# Patient Record
Sex: Male | Born: 1955 | Race: White | Hispanic: No | Marital: Married | Smoking: Current every day smoker
Health system: Southern US, Community
[De-identification: ages and names within clinical notes are randomized; demographics above are authoritative.]

## PROBLEM LIST (undated history)

## (undated) DIAGNOSIS — J449 Chronic obstructive pulmonary disease, unspecified: Secondary | ICD-10-CM

## (undated) DIAGNOSIS — F250 Schizoaffective disorder, bipolar type: Secondary | ICD-10-CM

## (undated) DIAGNOSIS — G4730 Sleep apnea, unspecified: Secondary | ICD-10-CM

## (undated) DIAGNOSIS — E079 Disorder of thyroid, unspecified: Secondary | ICD-10-CM

## (undated) DIAGNOSIS — G2401 Drug induced subacute dyskinesia: Secondary | ICD-10-CM

## (undated) HISTORY — PX: APPENDECTOMY: SHX54

---

## 2004-11-05 ENCOUNTER — Emergency Department: Payer: Self-pay | Admitting: Emergency Medicine

## 2004-11-12 ENCOUNTER — Inpatient Hospital Stay: Payer: Self-pay | Admitting: Internal Medicine

## 2005-03-14 ENCOUNTER — Emergency Department: Payer: Self-pay | Admitting: Internal Medicine

## 2005-03-14 ENCOUNTER — Other Ambulatory Visit: Payer: Self-pay

## 2005-05-11 ENCOUNTER — Ambulatory Visit: Payer: Self-pay | Admitting: Gastroenterology

## 2005-05-23 ENCOUNTER — Emergency Department: Payer: Self-pay | Admitting: Emergency Medicine

## 2005-06-03 ENCOUNTER — Ambulatory Visit: Payer: Self-pay | Admitting: Gastroenterology

## 2006-08-04 ENCOUNTER — Emergency Department: Payer: Self-pay | Admitting: Emergency Medicine

## 2006-08-17 ENCOUNTER — Other Ambulatory Visit: Payer: Self-pay

## 2006-08-17 ENCOUNTER — Emergency Department: Payer: Self-pay | Admitting: Emergency Medicine

## 2006-12-11 ENCOUNTER — Emergency Department: Payer: Self-pay | Admitting: Emergency Medicine

## 2007-03-15 ENCOUNTER — Ambulatory Visit: Payer: Self-pay | Admitting: Psychiatry

## 2007-03-15 ENCOUNTER — Inpatient Hospital Stay (HOSPITAL_COMMUNITY): Admission: AD | Admit: 2007-03-15 | Discharge: 2007-03-17 | Payer: Self-pay | Admitting: Psychiatry

## 2007-08-02 ENCOUNTER — Emergency Department: Payer: Self-pay | Admitting: Internal Medicine

## 2007-08-02 ENCOUNTER — Other Ambulatory Visit: Payer: Self-pay

## 2007-09-10 ENCOUNTER — Other Ambulatory Visit: Payer: Self-pay

## 2007-09-10 ENCOUNTER — Emergency Department: Payer: Self-pay | Admitting: Emergency Medicine

## 2009-04-05 ENCOUNTER — Inpatient Hospital Stay: Payer: Self-pay | Admitting: Internal Medicine

## 2009-11-29 ENCOUNTER — Emergency Department: Payer: Self-pay | Admitting: Emergency Medicine

## 2010-09-01 NOTE — Discharge Summary (Signed)
NAMEFORTINO, HAAG               ACCOUNT NO.:  [id number]   MEDICAL RECORD NO.:  [id number]          PATIENT TYPE:  IPS   LOCATION:  0405                          FACILITY:  [facility]   PHYSICIAN:  Anselm Jungling, MD  DATE OF BIRTH:  Jul 19, 1955   DATE OF ADMISSION:  03/15/2007  DATE OF DISCHARGE:  03/17/2007                               DISCHARGE SUMMARY   IDENTIFYING DATA AND REASON FOR ADMISSION:  This is the first [facility]  admission for Mr. Capri, a 55 year old married Caucasian male with a  long history of mental illness.  He is currently a client of the [address], located in [place], [place].  He is currently  under the care of Dr. Jeni Salles, and carries a diagnosis of  schizoaffective disorder, bipolar type, and OCD.   The patient was referred for admission due to severe psychotic  decompensation.  He was seen by the [facility] physician on 03/15/07,  the day of admission to our facility, and referred to [place].  He had not  slept for the previous 2 days.  He had forgotten to take some of his  medications during recent days, although to what extent he had been  noncompliant with his regimen was not clear.  He had been on a regimen  of Prozac 60 mg daily, Abilify 10 mg q.a.m., Clozaril 100 mg q.a.m. and  400 mg q.h.s., Valium 5 mg p.r.n., and Ambien 5 mg h.s.  Please refer to  the admission note for further details pertaining to the symptoms,  circumstances, and history that led to his hospitalization.  He was  given an Axis I diagnosis of schizoaffective disorder, bipolar type,  acute exacerbation, history of OCD.   MEDICAL AND LABORATORY:  The patient was medically and assessed by the  psychiatric nurse practitioner.  He is in good health, without any  active medical problems.  Referral documentation indicates a history of  irritable bowel syndrome.   ADMISSION LABORATORY:  Showed CBC, TSH, comprehensive metabolic panel,  urinalysis all within normal  limits. A urine drug screen was positive  for benzodiazepine, consistent with his prescription for diazepam.  A  Clozaril level was pending at the time of this dictation.   HOSPITAL COURSE:  The patient was admitted to the adult inpatient  psychiatric service where he presented as an obese, but well-nourished  and normally developed adult male who was disoriented to time, place,  and situation.  He displayed profound thought disorder, with  disorganized thinking and he was clearly responding strongly to internal  stimuli.  He appeared paranoid, alarmed, and hypervigilant.   Over the first 24 hours of his inpatient stay, he was intermittently  aggressive towards staff, requiring emergency responses by the staff,  and locked door seclusion on more than one occasion.   With respect to medication, Prozac was not continued because of the  appearance of possible manic activation.  However, he was continued on  his usual Abilify 10 mg q.a.m., and Clozaril 100 mg q.a.m. and 400 mg  q.h.s.  He was initially given several doses  of Ativan p.r.n., but he  continued to escalate, and there was concern that Ativan may have been  disinhibiting him.   Following this, he was given p.r.n. doses of Risperdal M-Tab, which did  not appear to be especially effective, and following this, p.r.n. doses  of Zyprexa, both orally and intramuscular.  In spite of repeated p.r.n.  doses, the patient continued intermittently agitated, and striking out  at staff.   After approximately 24 hours of treatment, he was given p.r.n.  Thorazine, 100 mg IM, and even after 3 or 4 doses of Thorazine, he  continued alert, aggressive, irritable, and hostile, and while in locked-  door seclusion, banging loudly on the door.   The patient was admitted on a voluntary basis, but he was involuntarily  committed during his stay with [place].   We learned, from discussion with his wife, that he had a history of  previous  hospitalizations at [facility].  It appeared to [place]  that the patient was going to require a course of treatment that was  going to involve greater duration and intensity than our facility can  provide.  As such, we have taken steps to explore the possibility to  transfer the patient to the [facility] for further  treatment.   At this time, it appears that there is a strong affective, manic  component to his psychosis, as indicated by his markedly diminished  sleep need, and continuing activation and hypervigilance in spite of  numerous doses of anti-psychotic medications as described above.  As  such it appears appropriate at this time to initiate mood stabilizing  medication, and, while the patient remains with [place], awaiting transfer,  we will begin Depakote 500 mg t.i.d.  Thorazine p.r.n.s will be  increased to 300 mg IM p.r.n.  At this point, the patient has struck out  and injured some staff, and appears quite dangerous, so it is felt that  the increase to 300 mg Thorazine doses is appropriate for the safety of  others, as well as stabilization of the patient.   DISCHARGE DIAGNOSES:  AXIS I:  Schizoaffective disorder, bipolar type,  manic, with psychotic features, history of OCD.  AXIS II:  Deferred.  AXIS III:  History of irritable bowel syndrome.  AXIS IV:  Stressors, severe.  AXIS V:  Global assessment of function 25.      Anselm Jungling, MD  Electronically Signed     SPB/MEDQ  D:  03/17/2007  T:  03/17/2007  Job:  [id number]

## 2010-09-01 NOTE — H&P (Signed)
NAMECHARAN, PRIETO NO.:  [id number]   MEDICAL RECORD NO.:  [id number]          PATIENT TYPE:  IPS   LOCATION:  0405                          FACILITY:  [facility]   PHYSICIAN:  Anselm Jungling, MD  DATE OF BIRTH:  1955/05/04   DATE OF ADMISSION:  03/15/2007  DATE OF DISCHARGE:                              HISTORY & PHYSICAL   IDENTIFYING INFORMATION:  This is a 55 year old married white male.  This is a voluntary admission.   HISTORY OF PRESENT ILLNESS AND CHIEF COMPLAINT:  The patient was  referred by his psychiatrist in [facility], Dr. Darnelle Going, for  psychosis.  He had called the crisis line there 2 days in a row and then  presented disheveled complaining of no sleep for the previous 3-4  nights, anxiety, poor appetite.  Was having a lot of hyper-religious  thinking, feeling that God was punishing him, that he was somehow the  cause of the death of a close friend who had died about 1 month  previous.  He reported he was generally compliant with his medications  but had maybe missed a couple of doses in the past couple of days.  Was  feeling quite paranoid, anxious, and on presentation to the unit here  was having considerable thought blocking, sitting in the hallway singing  Romero Liner, and having quite a bit of religious thinking.  Had also  become more agitated and combative and had several episodes over the  past 6 hours of attempting to swing and strike at staff.   PAST HISTORY:  This is a generally healthy white male, married, with a  past medical history positive for an appendectomy.  Also history of some  chronic constipation on his medications, which he manages by diet.  No  known prior history of suicide attempts.  He sees Dr. Darnelle Going in  [facility], who is his regular outpatient psychiatrist.  First  psychiatric hospitalization was at age 59 in [place], [place], and  he has since that time had several subsequent psychiatric  admissions  including about 2 to [facility], also to [facility] in [place], at least 1 admission to [facility], and hospitalizations at [facility] in  [place].  Outpatient treatment previously at [facility] and currently at [facility].  He is diagnosed with  schizoaffective disorder, bipolar type.   FAMILY HISTORY:  Remarkable for grandfather with alcohol problems,  brother with mood fluctuations, father with alcohol dependence, and 1  uncle with World War II related post-traumatic stress disorder.  Also  had 1 uncle with a delusional disorder.   SOCIAL HISTORY:  The patient is on disability for his mental illness,  living in a stable relationship with a supportive wife in [place].  No legal problems.   DRUG ALLERGIES:  No known drug allergies.  He has reported adverse  reactions and intolerance to SEROQUEL, TRAZODONE, and HALDOL.  He is  generally compliant with his medications.   CURRENT MEDICATIONS:  1. Prozac 60 mg q.a.m.  2.  Abilify 10 mg q.a.m.  3. Clozapine 100 mg 1 every morning and 4 at h.s.  4. Valium 5 mg 1 daily as needed for anxiety.  5. He has occasionally been treated with Ambien 5 mg for sleep but      does not take that regularly.   REVIEW OF SYSTEMS:  CONSTITUTIONAL:  No fever or chills.  No appetite or  weight changes; although his appetite and daily oral intake have been  erratic over the past 1 week.  Sleep was decreased to 4 hours per night  for about 2 weeks and then no sleep at all for 4 days.  ENDOCRINE:  He  denies any intolerance to heat or cold.  No changes in hair texture or  skin dryness.  CARDIOVASCULAR:  No episodes of syncope.  No chest pain,  no palpitations.  RESPIRATORY:  No cough, no paroxysmal nocturnal  dyspnea, no wheeze, no difficulties breathing.  GASTROINTESTINAL:  His  bowels are regular every 3-4 days by just managing his diet.  He  takes  no regular medications.  Rare use of laxatives.  No changes in bowel or  bladder function.  No changes in color or character of stool.   PHYSICAL EXAMINATION:  VITAL SIGNS AT THE TIME OF ADMISSION:  Height 5  feet 9 inches tall, 215 pounds, temperature 98.7, pulse 106, blood  pressure 137/90, and respirations 18.  GENERAL:  This is an agitated white male who appears in no physical  distress.  We have considerable difficulty getting him to cooperate with  the physical exam.  HEENT:  Head is normocephalic and atraumatic.  No signs of bruising or  trauma.  NECK:  Supple.  No thyromegaly.  No carotid bruits are heard.  CHEST:  Chest clear to auscultation.  BREAST EXAM:  Deferred.  CARDIOVASCULAR:  S1 and S2 are heard.  No clicks, murmurs, or gallops  and apical pulse is synchronous with radial pulse.  ABDOMEN:  Soft, nontender, nondistended.  GENITOURINARY:  Deferred.  EXTREMITIES:  No peripheral edema, no cyanosis, no clubbing.  Peripheral  pulses are 2+.  SKIN:  No rashes.  No signs of self-mutilation.  NEUROLOGIC:  The patient is uncooperative with the cranial nerve exam.  Eye movements are coordinated.  No tremor.  Motor is smooth.  Motor  movements are symmetrical and sensory appears to be intact.  No gait  deviations are noted.   Diagnostic studies are remarkable for normal CBC and chemistry.  Liver  enzymes are all within normal limits.  Magnesium 2.2, TSH 3.331.  Please  refer to Dr. Barrett Shell note for the psychiatric initial mental status  exam.   DIAGNOSES:  AXIS I. Schizoaffective disorder, bipolar type, acute  exacerbation, and obsessive-compulsive disorder.  AXIS II. Deferred.  AXIS III. Some chronic constipation, questionable history of any  irritable bowel syndrome.  AXIS IV. Deferred.  AXIS V. Current 20, past year not known.      Margaret A. Lorin Picket, N.P.      Anselm Jungling, MD  Electronically Signed    MAS/MEDQ  D:  03/17/2007  T:  03/17/2007   Job:  [id number]

## 2011-01-26 LAB — DRUGS OF ABUSE SCREEN W/O ALC, ROUTINE URINE
Amphetamine Screen, Ur: NEGATIVE
Barbiturate Quant, Ur: NEGATIVE
Benzodiazepines.: POSITIVE — AB
Cocaine Metabolites: NEGATIVE
Creatinine,U: 70.9
Marijuana Metabolite: NEGATIVE
Methadone: NEGATIVE
Opiate Screen, Urine: NEGATIVE
Phencyclidine (PCP): NEGATIVE
Propoxyphene: NEGATIVE

## 2011-01-26 LAB — MAGNESIUM: Magnesium: 2.2

## 2011-01-26 LAB — COMPREHENSIVE METABOLIC PANEL
ALT: 27
AST: 24
Albumin: 3.7
Alkaline Phosphatase: 76
BUN: 8
CO2: 24
Calcium: 9.1
Chloride: 109
Creatinine, Ser: 1.09
GFR calc Af Amer: >60
GFR calc non Af Amer: >60
Glucose, Bld: 110 — ABNORMAL HIGH
Potassium: 3.7
Sodium: 142
Total Bilirubin: 0.9
Total Protein: 6.5

## 2011-01-26 LAB — DIFFERENTIAL
Basophils Absolute: 0
Basophils Relative: 0
Eosinophils Absolute: 0 — ABNORMAL LOW
Eosinophils Relative: 0
Lymphocytes Relative: 16
Lymphs Abs: 2.1
Monocytes Absolute: 0.9
Monocytes Relative: 7
Neutro Abs: 9.8 — ABNORMAL HIGH
Neutrophils Relative %: 76

## 2011-01-26 LAB — BENZODIAZEPINE, QUANTITATIVE, URINE
Alprazolam (GC/LC/MS), ur confirm: NEGATIVE
Flurazepam GC/MS Conf: NEGATIVE
Nordiazepam GC/MS Conf: 140 ng/mL
Oxazepam GC/MS Conf: 620 ng/mL

## 2011-01-26 LAB — URINALYSIS, ROUTINE W REFLEX MICROSCOPIC
Bilirubin Urine: NEGATIVE
Glucose, UA: NEGATIVE
Hgb urine dipstick: NEGATIVE
Ketones, ur: NEGATIVE
Nitrite: NEGATIVE
Protein, ur: NEGATIVE
Specific Gravity, Urine: 1.009
Urobilinogen, UA: 0.2
pH: 5.5

## 2011-01-26 LAB — CBC
HCT: 39.9
Hemoglobin: 13.5
MCHC: 33.8
MCV: 88.4
Platelets: 207
RBC: 4.51
RDW: 17.2 — ABNORMAL HIGH
WBC: 12.3 — ABNORMAL HIGH

## 2011-01-26 LAB — TSH: TSH: 3.331

## 2011-01-26 LAB — CLOZAPINE (CLOZARIL): Clozapine Lvl: 775 ng/mL

## 2012-02-17 ENCOUNTER — Inpatient Hospital Stay: Payer: Self-pay | Admitting: Internal Medicine

## 2012-02-17 LAB — URINALYSIS, COMPLETE
Bacteria: NONE SEEN
Bilirubin,UR: NEGATIVE
Glucose,UR: NEGATIVE mg/dL (ref 0–75)
Ketone: NEGATIVE
Leukocyte Esterase: NEGATIVE
Nitrite: NEGATIVE
Ph: 6 (ref 4.5–8.0)
Protein: NEGATIVE
RBC,UR: 1 /HPF (ref 0–5)
Specific Gravity: 1.018 (ref 1.003–1.030)
Squamous Epithelial: <1
WBC UR: <1 /HPF (ref 0–5)

## 2012-02-17 LAB — COMPREHENSIVE METABOLIC PANEL
Albumin: 3.1 g/dL — ABNORMAL LOW (ref 3.4–5.0)
Alkaline Phosphatase: 105 U/L (ref 50–136)
Anion Gap: 12 (ref 7–16)
BUN: 12 mg/dL (ref 7–18)
Bilirubin,Total: 0.5 mg/dL (ref 0.2–1.0)
Calcium, Total: 8.6 mg/dL (ref 8.5–10.1)
Chloride: 106 mmol/L (ref 98–107)
Co2: 21 mmol/L (ref 21–32)
Creatinine: 1.24 mg/dL (ref 0.60–1.30)
EGFR (African American): >60
EGFR (Non-African Amer.): >60
Glucose: 159 mg/dL — ABNORMAL HIGH (ref 65–99)
Osmolality: 281 (ref 275–301)
Potassium: 3.6 mmol/L (ref 3.5–5.1)
SGOT(AST): 9 U/L — ABNORMAL LOW (ref 15–37)
SGPT (ALT): 14 U/L (ref 12–78)
Sodium: 139 mmol/L (ref 136–145)
Total Protein: 6.7 g/dL (ref 6.4–8.2)

## 2012-02-17 LAB — CBC
HCT: 44.5 % (ref 40.0–52.0)
HGB: 15.5 g/dL (ref 13.0–18.0)
MCH: 33 pg (ref 26.0–34.0)
MCHC: 34.8 g/dL (ref 32.0–36.0)
MCV: 95 fL (ref 80–100)
Platelet: 201 10*3/uL (ref 150–440)
RBC: 4.68 10*6/uL (ref 4.40–5.90)
RDW: 14.1 % (ref 11.5–14.5)
WBC: 14.9 10*3/uL — ABNORMAL HIGH (ref 3.8–10.6)

## 2012-02-17 LAB — VALPROIC ACID LEVEL: Valproic Acid: 87 ug/mL

## 2012-02-17 LAB — TSH: Thyroid Stimulating Horm: 2.02 u[IU]/mL

## 2012-02-17 LAB — LIPASE, BLOOD: Lipase: 189 U/L (ref 73–393)

## 2012-02-17 LAB — HEMOGLOBIN
HGB: 14.4 g/dL (ref 13.0–18.0)
HGB: 14.5 g/dL (ref 13.0–18.0)

## 2012-02-18 LAB — BASIC METABOLIC PANEL
Anion Gap: 7 (ref 7–16)
BUN: 8 mg/dL (ref 7–18)
Calcium, Total: 8.4 mg/dL — ABNORMAL LOW (ref 8.5–10.1)
Chloride: 109 mmol/L — ABNORMAL HIGH (ref 98–107)
Co2: 26 mmol/L (ref 21–32)
Creatinine: 1 mg/dL (ref 0.60–1.30)
EGFR (African American): >60
EGFR (Non-African Amer.): >60
Glucose: 95 mg/dL (ref 65–99)
Osmolality: 281 (ref 275–301)
Potassium: 4.2 mmol/L (ref 3.5–5.1)
Sodium: 142 mmol/L (ref 136–145)

## 2012-02-18 LAB — CBC WITH DIFFERENTIAL/PLATELET
Basophil #: 0.1 10*3/uL (ref 0.0–0.1)
Basophil %: 0.4 %
Eosinophil #: 0.1 10*3/uL (ref 0.0–0.7)
Eosinophil %: 0.6 %
HCT: 40.1 % (ref 40.0–52.0)
HGB: 14.1 g/dL (ref 13.0–18.0)
Lymphocyte #: 3.2 10*3/uL (ref 1.0–3.6)
Lymphocyte %: 17.7 %
MCH: 33.8 pg (ref 26.0–34.0)
MCHC: 35.2 g/dL (ref 32.0–36.0)
MCV: 96 fL (ref 80–100)
Monocyte #: 2 x10 3/mm — ABNORMAL HIGH (ref 0.2–1.0)
Monocyte %: 11 %
Neutrophil #: 12.5 10*3/uL — ABNORMAL HIGH (ref 1.4–6.5)
Neutrophil %: 70.3 %
Platelet: 167 10*3/uL (ref 150–440)
RBC: 4.18 10*6/uL — ABNORMAL LOW (ref 4.40–5.90)
RDW: 14.4 % (ref 11.5–14.5)
WBC: 17.8 10*3/uL — ABNORMAL HIGH (ref 3.8–10.6)

## 2012-02-19 LAB — CBC WITH DIFFERENTIAL/PLATELET
Basophil #: 0 10*3/uL (ref 0.0–0.1)
Basophil %: 0.3 %
Eosinophil #: 0.1 10*3/uL (ref 0.0–0.7)
Eosinophil %: 0.9 %
HCT: 40.2 % (ref 40.0–52.0)
HGB: 13.7 g/dL (ref 13.0–18.0)
Lymphocyte #: 3.7 10*3/uL — ABNORMAL HIGH (ref 1.0–3.6)
Lymphocyte %: 23.9 %
MCH: 32.9 pg (ref 26.0–34.0)
MCHC: 34.1 g/dL (ref 32.0–36.0)
MCV: 96 fL (ref 80–100)
Monocyte #: 1.2 x10 3/mm — ABNORMAL HIGH (ref 0.2–1.0)
Monocyte %: 7.6 %
Neutrophil #: 10.3 10*3/uL — ABNORMAL HIGH (ref 1.4–6.5)
Neutrophil %: 67.3 %
Platelet: 165 10*3/uL (ref 150–440)
RBC: 4.17 10*6/uL — ABNORMAL LOW (ref 4.40–5.90)
RDW: 14.6 % — ABNORMAL HIGH (ref 11.5–14.5)
WBC: 15.3 10*3/uL — ABNORMAL HIGH (ref 3.8–10.6)

## 2012-02-19 LAB — CLOSTRIDIUM DIFFICILE BY PCR

## 2012-02-19 LAB — WBCS, STOOL

## 2012-02-19 LAB — OCCULT BLOOD X 1 CARD TO LAB, STOOL: Occult Blood, Feces: POSITIVE

## 2012-02-20 LAB — CBC WITH DIFFERENTIAL/PLATELET
Basophil #: 0.1 10*3/uL (ref 0.0–0.1)
Basophil %: 0.4 %
Eosinophil #: 0.1 10*3/uL (ref 0.0–0.7)
Eosinophil %: 1.1 %
HCT: 39.2 % — ABNORMAL LOW (ref 40.0–52.0)
HGB: 13.4 g/dL (ref 13.0–18.0)
Lymphocyte #: 3.1 10*3/uL (ref 1.0–3.6)
Lymphocyte %: 23.1 %
MCH: 33 pg (ref 26.0–34.0)
MCHC: 34.1 g/dL (ref 32.0–36.0)
MCV: 97 fL (ref 80–100)
Monocyte #: 1.3 x10 3/mm — ABNORMAL HIGH (ref 0.2–1.0)
Monocyte %: 9.9 %
Neutrophil #: 8.8 10*3/uL — ABNORMAL HIGH (ref 1.4–6.5)
Neutrophil %: 65.5 %
Platelet: 173 10*3/uL (ref 150–440)
RBC: 4.06 10*6/uL — ABNORMAL LOW (ref 4.40–5.90)
RDW: 14.2 % (ref 11.5–14.5)
WBC: 13.5 10*3/uL — ABNORMAL HIGH (ref 3.8–10.6)

## 2012-02-20 LAB — BASIC METABOLIC PANEL
Anion Gap: 10 (ref 7–16)
BUN: 6 mg/dL — ABNORMAL LOW (ref 7–18)
Calcium, Total: 8.2 mg/dL — ABNORMAL LOW (ref 8.5–10.1)
Chloride: 114 mmol/L — ABNORMAL HIGH (ref 98–107)
Co2: 24 mmol/L (ref 21–32)
Creatinine: 0.93 mg/dL (ref 0.60–1.30)
EGFR (African American): >60
EGFR (Non-African Amer.): >60
Glucose: 95 mg/dL (ref 65–99)
Osmolality: 292 (ref 275–301)
Potassium: 3.6 mmol/L (ref 3.5–5.1)
Sodium: 148 mmol/L — ABNORMAL HIGH (ref 136–145)

## 2012-02-21 LAB — CBC WITH DIFFERENTIAL/PLATELET
Basophil #: 0.1 10*3/uL (ref 0.0–0.1)
Basophil %: 0.7 %
Eosinophil #: 0.2 10*3/uL (ref 0.0–0.7)
Eosinophil %: 1.5 %
HCT: 38.5 % — ABNORMAL LOW (ref 40.0–52.0)
HGB: 13 g/dL (ref 13.0–18.0)
Lymphocyte #: 3 10*3/uL (ref 1.0–3.6)
Lymphocyte %: 25.7 %
MCH: 32.6 pg (ref 26.0–34.0)
MCHC: 33.9 g/dL (ref 32.0–36.0)
MCV: 96 fL (ref 80–100)
Monocyte #: 1.2 x10 3/mm — ABNORMAL HIGH (ref 0.2–1.0)
Monocyte %: 10.6 %
Neutrophil #: 7.2 10*3/uL — ABNORMAL HIGH (ref 1.4–6.5)
Neutrophil %: 61.5 %
Platelet: 178 10*3/uL (ref 150–440)
RBC: 3.99 10*6/uL — ABNORMAL LOW (ref 4.40–5.90)
RDW: 14.2 % (ref 11.5–14.5)
WBC: 11.7 10*3/uL — ABNORMAL HIGH (ref 3.8–10.6)

## 2012-02-21 LAB — STOOL CULTURE

## 2012-02-23 LAB — CULTURE, BLOOD (SINGLE)

## 2014-04-08 ENCOUNTER — Emergency Department: Payer: Self-pay | Admitting: Emergency Medicine

## 2014-04-13 LAB — CULTURE, BLOOD (SINGLE)

## 2014-06-13 ENCOUNTER — Inpatient Hospital Stay: Payer: Self-pay | Admitting: Internal Medicine

## 2014-08-06 NOTE — Consult Note (Signed)
CC: left lower abd pain and colitis.  Pt afebrile, no vomiting or stools during the night. BP low at 93/64.  abd mild tender on left lower area./ on antibiotics.  Previous colonoscopy was 2012.  No new suggestions.  Electronic Signatures: Scot JunElliott, Janica Eldred T (MD)  (Signed on 02-Nov-13 12:54)  Authored  Last Updated: 02-Nov-13 12:54 by Scot JunElliott, Farhiya Rosten T (MD)

## 2014-08-06 NOTE — Consult Note (Signed)
PATIENT NAME:  Austin Marsh, WAREHIME MR#:  [id number] DATE OF BIRTH:  03/17/1956  DATE OF CONSULTATION:  02/17/2012  REFERRING PHYSICIAN:  Hilda Lias, MD CONSULTING PHYSICIAN:  Lutricia Feil, MD / Ranae Plumber. Arvilla Market, ANP  PRIMARY CARE PHYSICIAN: Toy Cookey, MD  REASON FOR CONSULTATION: Colitis with abdominal pain and bloody stools.   HISTORY OF PRESENT ILLNESS: This 59 year old patient with history of bipolar schizophrenia, prior history of rectal bleeding attributed to diverticulosis, and chronic constipation presented to the Emergency Room for acute episode of abdominal pain and bloody stools. He  was found to have gross blood with heme-positive finding on digital rectal exam. CT scan showed left-sided colitis. The patient has been placed on IV antibiotic therapy. Gastroenterology is asked to see the patient regarding further evaluation and management.   He reports that his baseline bowel movements are mild constipation. He had utilized Amitiza earlier this year, and it caused nausea so he discontinued the use. He does not take any medication for constipation. He did have a hard stool yesterday, at about 3:20. He says it was typical of his constipation, described as small balls. Twenty minutes later, he developed crampiness, slight nausea and had another bowel movement at this time where he felt like he had a good clean out. Twenty minutes later he had another bowel movement and this was liquid watery with a little red. For about 8 or 9 movements stools became more loose, watery, and redder in color. He was concerned about a GI bleed and called 911 and was transported to the hospital. The patient says he has had a bit of chills, had a fever a week ago with some nausea and nasal congestion and just completed a one week course of Keflex.  The patient said he had his first colonoscopy in 2007 by Dr. Servando Snare and that showed diverticulosis. Upper endoscopy in 2007  was normal. He reports a second colonoscopy done  about a year ago, at [facility], results unknown, but he thinks they were okay. He was hospitalized in 2010 and Dr. Marva Panda saw him in consultation for rectal bleeding and it was thought to be attributed to diverticulosis. The patient denies history of ulcerative colitis or Crohn's disease and he has had no further episodes of bloody stools since 2010. Currently, he reports 1 out of 10 diffuse lower abdominal discomfort, says he does have pain when he coughs or sneezes. He has not had a bowel movement since he was in the Emergency Room this morning and his stool cultures have not been obtained. He reports normal diet, appetite, and weight.   PAST MEDICAL HISTORY:  1. The patient says he is bipolar. Chart review shows schizophrenia.  2. Gastroesophageal reflux disease.  3. Diverticulosis.  4. History of chronic leukocytosis.  5. Hospitalized for diverticular bleed in 2010. No blood transfusion.   PAST SURGICAL HISTORY:  1. Appendectomy in 2000.  2. The patient reports colonoscopy in 2007 performed by Dr. Servando Snare. Results reviewed. Diverticulosis without evidence of diverticular bleed.  3. The patient reports colonoscopy done possibly 2012, we will obtain records.  4. EGD in 2007 within normal.   MEDICATIONS: 1. Synthroid 50 mcg daily. 2. Depakote 500 mg 2 tablets at bedtime. 3. Valium 5 mg as needed. 4. Clozaril 100 mg one in the morning two tablets at bedtime.  5. Aspirin 81 mg daily.  6. Align 4 mg daily.  7. Omeprazole 20 mg daily.  8. Amitiza prescription in possession but not utilizing.  9. Keflex, completed  a week ago.   ALLERGIES: Haldol causes tardive dyskinesia.   SOCIAL HISTORY: Positive tobacco, one pack per day for 40 year. History of alcohol abuse, discontinued 10 years ago. Patient lives at home with his wife. He is on disability secondary to mental health. [facility]   FAMILY HISTORY: To his knowledge, negative for colon cancer. Poor  historian.  REVIEW OF SYSTEMS: Ten systems reviewed. Negative with the exception other than what is noted in the History of Present Illness.  PHYSICAL EXAMINATION:   VITAL SIGNS: 97.8, 87, 18, 123/79, 97% on room air.   GENERAL: Well-appearing Caucasian male resting in bed, in no acute distress.   HEENT: Head is normocephalic. Conjunctivae without injection. Sclerae anicteric. Oral mucosa is dry and intact.   NECK: Supple. Trachea midline. No lymphadenopathy.   HEART: Heart tones S1 and S2 without murmur, rub, or gallop.   LUNGS: Clear to auscultation. Respirations are eupneic.   ABDOMEN: Soft, tender in the lower abdomen (mild), no rigidity, rebound, or guarding. Decreased bowel sounds. Soft abdomen. No hepatosplenomegaly appreciated.   RECTAL: Digital rectal exam not performed as reviewed in the ER with gross blood, heme-positive.   EXTREMITIES: Scant edema in the feet. No cyanosis, or clubbing.   SKIN: Warm and dry. Color is somewhat pale. No skin rashes.   NEUROLOGIC: He is alert and oriented. Speech is clear. Answers questions consistently. Stays focus and a reasonable historian.   PSYCHIATRIC: Affect and mood is within normal, pleasant, and cooperative.   LABORATORY: Admission blood work notable for WBC 14.9, however, EMR shows that WBC in 2010 was 16.5 to 10.9 to 12.3; that is consistent with history of chronic leukocytosis. Hemoglobin is 15.5. Albumin is 3.1. Electrolytes are unremarkable. TSH is 2.2.   RADIOLOGY: CT scan of the abdomen and pelvis with contrast performed 02/17/2012 shows sigmoid and left colon wall prominently thickened with adjacent stranding. These findings are consistent with colitis. Pseudomembranous colitis should be considered.   IMPRESSION: The patient presents with acute change in bowel habits from constipation to watery fresh red bloody stools associated with crampiness, tenderness, mild nausea, low-grade fever, and abnormal CT scan showing  left-sided colitis. The patient did complete a Keflex course a week ago which raises the concern over C. difficile colitis. The patient also has the same type of history of bloody diarrhea in 2010 attributed at that time to diverticular bleed. He reports a colonoscopy done one year or so ago and we will obtain records from [facility]. Colonoscopy performed by Dr. Servando Snare in 2007 showed diverticulosis. The patient is currently receiving IV Cipro and Flagyl and he did receive a dose of Zosyn as well. He says he is feeling better overall, still has tenderness on examination, but rated as 1 out of 10. He is afebrile, but does have a mild leukocytosis and he has a history, per chart review, of leukocytosis.   PLAN: Continue antibiotic therapy. It would be ideal if we could have a stool study. The patient is aware of this but he has not had another bowel movement all day long. Records from [facility] for most recent colonoscopy.   This case was discussed with Dr. Bluford Kaufmann in collaboration of care. Gastroenterology to continue to follow the patient with further recommendations pending the patient's clinical course.   Thank you for this consultation.   These services are provided by Cala Bradford A. Arvilla Market, RN, MS, APRN, [facility], ANP under collaborative agreement with Lutricia Feil, MD. ____________________________ Ranae Plumber  Arvilla MarketMills, ANP kam:slb D: 02/17/2012 15:50:05 ET T: 02/17/2012 16:07:44 ET JOB#: [id number]  cc: Cala BradfordKimberly A. Arvilla MarketMills, ANP, <Dictator> Serita ShellerErnest B. Maryellen PileEason, MD Ranae PlumberKimberly A. Suzette BattiestMills RN, MSN, ANP-BC Adult Nurse Practitioner ELECTRONICALLY SIGNED 02/17/2012 17:08

## 2014-08-06 NOTE — Consult Note (Signed)
Pt seen and examined. Please see Judithann GravesK Mills' notes. Pt with hematochezia and abd pain and CT showing left sided colitis. Known hx of diverticulosis. Claims to have last colonoscopy last year either with Dr. Niel HummerIftikhar or [facility]. Will need to obtain those records. Also, need to get stool samples. Agree with Abx coverage for now. Will folllow. Thanks  Electronic Signatures: Lutricia Feilh, Frederich Montilla (MD)  (Signed on 31-Oct-13 15:32)  Authored  Last Updated: 31-Oct-13 15:32 by Lutricia Feilh, Frannie Shedrick (MD)

## 2014-08-06 NOTE — Consult Note (Signed)
Chief Complaint:   Subjective/Chief Complaint No BM since admission. Abd pain persists. Still waiting for last year's colon report.   VITAL SIGNS/ANCILLARY NOTES: **Vital Signs.:   01-Nov-13 05:21   Vital Signs Type Routine   Temperature Temperature (F) 98.3   Celsius 36.8   Temperature Source Oral   Pulse Pulse 89   Respirations Respirations 18   Systolic BP Systolic BP 378   Diastolic BP (mmHg) Diastolic BP (mmHg) 74   Mean BP 86   Pulse Ox % Pulse Ox % 92   Pulse Ox Activity Level  At rest   Oxygen Delivery Room Air/ 21 %   Brief Assessment:   Cardiac Regular    Respiratory clear BS    Gastrointestinal low abd tenderness   Lab Results: Routine Chem:  01-Nov-13 02:54    Glucose, Serum 95   BUN 8   Creatinine (comp) 1.00   Sodium, Serum 142   Potassium, Serum 4.2   Chloride, Serum  109   CO2, Serum 26   Calcium (Total), Serum  8.4   Anion Gap 7   Osmolality (calc) 281   eGFR (African American) >60   eGFR (Non-African American) >60 (eGFR values <22m/min/1.73 m2 may be an indication of chronic kidney disease (CKD). Calculated eGFR is useful in patients with stable renal function. The eGFR calculation will not be reliable in acutely ill patients when serum creatinine is changing rapidly. It is not useful in  patients on dialysis. The eGFR calculation may not be applicable to patients at the low and high extremes of body sizes, pregnant women, and vegetarians.)  Routine Hem:  01-Nov-13 02:54    WBC (CBC)  17.8   RBC (CBC)  4.18   Hemoglobin (CBC) 14.1   Hematocrit (CBC) 40.1   Platelet Count (CBC) 167   MCV 96   MCH 33.8   MCHC 35.2   RDW 14.4   Neutrophil % 70.3   Lymphocyte % 17.7   Monocyte % 11.0   Eosinophil % 0.6   Basophil % 0.4   Neutrophil #  12.5   Lymphocyte # 3.2   Monocyte #  2.0   Eosinophil # 0.1   Basophil # 0.1 (Result(s) reported on 18 Feb 2012 at 03:12AM.)   Assessment/Plan:  Assessment/Plan:   Assessment Colitis. No further  rectal bleeding.    Plan Continue Abx for now. Ok to start clears and advance as tolerated. Dr. EVira Agarwill see patient over the weekend. Thanks.   Electronic Signatures: OVerdie Shire(MD)  (Signed [id number])  Authored: Chief Complaint, VITAL SIGNS/ANCILLARY NOTES, Brief Assessment, Lab Results, Assessment/Plan   Last Updated: 01-Nov-13 12:16 by OVerdie Shire(MD)

## 2014-08-06 NOTE — Discharge Summary (Signed)
PATIENT NAME:  Austin Marsh, Austin MR#:  [id number] DATE OF BIRTH:  May 30, 1955  DATE OF ADMISSION:  02/17/2012 DATE OF DISCHARGE:  02/21/2012  PRIMARY CARE PHYSICIAN: Toy CookeyErnest Eason, MD GASTROENTEROLOGIST: Dr Bluford Kaufmannh and Dr Mechele CollinElliott.    FINAL DIAGNOSES:  1. Colitis.  2. Gastrointestinal bleed, lower.  3. Leukocytosis.  4. Hypothyroidism.  5. Schizophrenia.  6. Gastroesophageal reflux disease.   MEDICATIONS ON DISCHARGE:  1. Align 4 mg daily.  2. Clozapine 100 mg 1 tablet in the morning, 2 tablets at night. 3. Diazepam 5 mg as needed daily.  4. Depakote 500 mg 2 tablets daily at bedtime. 5. Levothyroxine 50 mcg daily.  6. Omeprazole 20 mg daily.  7. Metronidazole 250 mg every 8 hours for 5 days.  8. Tigan 300 mg every 8 hours as needed for nausea, vomiting. 9. Cipro 500 mg every 12 hours for 5 days.  10. Stop aspirin.   DIET: Regular diet, regular consistency.   ACTIVITY: Activity as tolerated.   FOLLOWUP: Follow with Dr. Bluford Kaufmannh, Gastroenterology, if needed, one week with Dr. Maryellen PileEason.   REASON FOR ADMISSION: The patient was admitted 02/17/2012 and discharged 02/21/2012. Came in with bright red blood per rectum and abdominal pain.   HISTORY OF PRESENT ILLNESS: A 59 year old man presented to the ER after having multiple episodes of bright red blood per rectum, diffuse abdominal pain. He was admitted with colitis, empirically started on Cipro and Flagyl, given IV fluids, and serial hemoglobins were sent off. Consultation was done by Dr Bluford Kaufmannh, Gastroenterology.   LABORATORY AND RADIOLOGICAL DATA DURING THE HOSPITAL COURSE: Blood cultures were negative. Lipase 189, valproic acid 87, TSH 2.02. Glucose 159, BUN 12, creatinine 1.24, sodium 139, potassium 3.6, chloride 106, CO2 of 21, calcium 8.6. Liver function tests normal range. White blood cell count 14.9, hemoglobin 15.5 and hematocrit 44.5, platelet count of 201,000. EKG: Sinus tachycardia 105 beats per minute, left axis deviation, RSR prime. CT scan of  the abdomen and pelvis with contrast showed sigmoid and left colitis. Urinalysis negative. Stool for C. difficile was negative. White blood cells 5 to 15 white blood cells seen, all polymorphs. Stool culture no salmonella, shigella, no Campylobacter, no Escherichia coli detected. Hemoglobin upon discharge 13.0. White blood cell count upon discharge 11.7.   HOSPITAL COURSE PER PROBLEM LIST:  1. For the patient's colitis, this had improved during the hospital course. He was tolerating regular diet at the time of discharge. He was empirically put on Cipro and Flagyl, and this was switched over to oral upon discharge. He will complete a full course of antibiotics. The patient was not having any abdominal pain at the time of discharge.  2. Gastrointestinal bleed, lower. The amount of bleeding was minimal with each bowel movement and lessened. His last bowel movement did not have any blood. His hemoglobin remained stable despite hydration and blood loss. Hemoglobin dropped from 15 down to 13.  3. Leukocytosis. This had improved with treatment during the hospital course.  4. For his hypothyroidism, he is on levothyroxine.  5. For his schizophrenia he is on psychiatric medications. No changes were made in these medications.  6. For his gastroesophageal reflux disease he is on omeprazole.  TIME SPENT ON DISCHARGE: 35 minutes.  ____________________________ Herschell Dimesichard J. Renae GlossWieting, MD rjw:vtd D: 02/21/2012 14:12:21 ET    T: 02/21/2012 14:54:51 ET   JOB#: [id number] cc: Herschell Dimesichard J. Renae GlossWieting, MD, <Dictator> Serita ShellerErnest B. Maryellen PileEason, MD Ezzard StandingPaul Y. Bluford Kaufmannh, MD Scot Junobert T. Elliott, MD Salley ScarletICHARD J Nell Gales MD ELECTRONICALLY SIGNED 02/26/2012 17:51

## 2014-08-06 NOTE — Consult Note (Signed)
CC: colitis L sided.  Pt has no tenderness on palpation, his stools are forming up, no blood in last stool.  WBC down to 13.5 from 15, did have WBC in stool.  He seems to be responding to the antibiotics, possible DC tomorrow or Tuesday.   Electronic Signatures: Scot JunElliott, Robert T (MD)  (Signed on 03-Nov-13 10:42)  Authored  Last Updated: 03-Nov-13 10:42 by Scot JunElliott, Robert T (MD)

## 2014-08-06 NOTE — H&P (Signed)
PATIENT NAME:  Austin Marsh, Austin Marsh MR#:  [id number] DATE OF BIRTH:  11/26/1955  DATE OF ADMISSION:  02/17/2012  PRIMARY CARE PHYSICIAN: Dr. Maryellen Pile   CHIEF COMPLAINT: Bright red blood per rectum and abdominal pain.   HISTORY OF PRESENT ILLNESS: This is a 59 year old male who presented to the Emergency Room after having multiple episodes of bright red blood per rectum and also having diffuse abdominal pain. He describes the abdominal pain more crampy in nature and improves when he has a bowel movement. He's had five bowel movements overnight which have been bloody in nature with some mixed brown stool. The patient said he had similar symptoms like this a few years ago but cannot recall what he was diagnosed with. As per patient, he was just recently put on some antibiotics and finished them about a week or so ago. He denies any documented fever but does admit to chills. No nausea. No vomiting. No chest pain. No shortness of breath. No other associated symptoms presently. The patient presented to the Emergency Room and underwent a CT scan which showed evidence of sigmoid/left-sided colitis. Hospitalist services were contacted for further treatment and evaluation.   REVIEW OF SYSTEMS: CONSTITUTIONAL: No documented fever. Positive chills. No weight gain, no weight loss. EYES: No blurry or double vision. ENT: No tinnitus. No postnasal drip. No redness of the oropharynx. RESPIRATORY: No cough, no wheeze, no hemoptysis. CARDIOVASCULAR: No chest pain, no orthopnea, no palpitations, no syncope. GI: No nausea, no vomiting. Positive diffuse abdominal pain. Positive hematochezia. No melena. GU: No dysuria, no hematuria. ENDOCRINE: No polyuria or nocturia. No heat or cold intolerance. HEME: No anemia. No bruising. Positive acute bleeding. INTEGUMENTARY: No rashes, no lesions. MUSCULOSKELETAL: No arthritis, no swelling, no gout. NEUROLOGIC: No numbness, no tingling, no ataxia, no seizure-type activity. PSYCH: No anxiety, no  insomnia, no ADD. Positive schizophrenia.   ALLERGIES: Haldol causes tardive dyskinesia.   SOCIAL HISTORY: Does smoke about a pack per day, has been smoking for the past 40+ years. History of alcohol abuse but has not used in 10+ years. Also, history of marijuana use but has not used in 20+ years. Lives at home with his wife.   FAMILY HISTORY: Cannot recall his family history. His father is still alive and healthy. His mother is deceased.   CURRENT MEDICATIONS:  1. Synthroid 50 mcg daily.  2. Depakote 500 mg 2 tabs at bedtime. 3. Valium 5 mg daily as needed.  4. Clozaril 100 mg 1 tab in the morning, 2 tabs at bedtime.  5. Aspirin 81 mg daily.  6. Align 4 mg daily.  7. Omeprazole 20 mg daily.   PHYSICAL EXAMINATION ON ADMISSION:   VITAL SIGNS: Temperature 99.4, pulse 102, respirations 18, blood pressure 123/70, sats 94% on room air.   GENERAL: He is a pleasant appearing male in no apparent distress.   HEENT: Atraumatic, normocephalic. Extraocular muscles are intact. Pupils are equal and reactive to light. Sclerae anicteric. No conjunctival injection. No pharyngeal erythema.   NECK: Supple. No jugular venous distention, no bruits, no lymphadenopathy, no thyromegaly.   HEART: Regular rate and rhythm. Tachycardic. No murmurs, no rubs, no clicks.   LUNGS: Clear to auscultation bilaterally. No rales, no rhonchi, no wheezes.   ABDOMEN: Soft, flat, tender diffusely. No rebound. No rigidity. Good bowel sounds. No hepatosplenomegaly appreciated.   EXTREMITIES: No evidence of any cyanosis, clubbing, or peripheral edema. Has +2 pedal and radial pulses bilaterally.   NEUROLOGICAL: The patient is alert, awake, and oriented x3 with  no focal motor or sensory deficits appreciated bilaterally.   SKIN: Moist and warm with no rash appreciated.   LYMPHATIC: There is no cervical or axillary lymphadenopathy.   LABORATORY, DIAGNOSTIC, AND RADIOLOGICAL DATA: Serum glucose 159, BUN 12, creatinine 1.2,  sodium 139, potassium 3.6, chloride 106, bicarb 21. The patient's LFTs are within normal limits. TSH 2.2. Valproic acid level is 87. White cell count 14.9, hemoglobin 15.5, hematocrit 44.5, platelet count 201.    The patient did have a CT of the abdomen and pelvis done with contrast which showed sigmoid and left colitis, consider pseudomembranous colitis.   ASSESSMENT AND PLAN: This is a 59 year old male with a history of schizophrenia, hypothyroidism, gastroesophageal reflux disease, and history of diverticulosis who presents to the hospital with multiple episodes of bright red blood per rectum and CT scan evidence of sigmoid/left colitis.  1. Sigmoid/left-sided colitis. Questionable if this is inflammatory versus infectious. The patient was recently on antibiotics but cannot recall what he was taking the antibiotics for or what he was on. I will check stool for Clostridium difficile and comprehensive cultures. Empirically start him on ciprofloxacin and Flagyl. Pain control with IV morphine. Continue IV fluids and antiemetics. Keep him n.p.o. except for ice chips and meds. Will get a GI consult. I discussed the case with Dr. Bluford Kaufmannh.  2. GI bleed, likely acute lower GI bleed secondary to the colitis. Hemoglobin is currently stable. I will hold his aspirin. Follow serial hemoglobins for now. Continue empiric antibiotics as stated above and check stool for Clostridium difficile and comprehensive culture.  3. Gastroesophageal reflux disease. I will continue Prilosec.  4. Hypothyroidism. Continue Synthroid.  5. History of schizophrenia. Continue Clozaril and Depakote.  6. Leukocytosis likely secondary to the colitis. Will follow his white cell count after treatment with IV antibiotics.   CODE STATUS: The patient is a FULL CODE.   TIME SPENT: 50 minutes.   ____________________________ Rolly PancakeVivek J. Cherlynn KaiserSainani, MD vjs:drc D: 02/17/2012 11:52:02 ET T: 02/17/2012 12:08:12 ET JOB#: [id number]  cc: Rolly PancakeVivek J. Cherlynn KaiserSainani, MD,  <Dictator>, Serita ShellerErnest B. Maryellen PileEason, MD Houston SirenVIVEK J Gaetano Romberger MD ELECTRONICALLY SIGNED 02/24/2012 12:57

## 2014-08-18 NOTE — Discharge Summary (Signed)
PATIENT NAME:  Austin Marsh, Austin Marsh MR#:  [id number] DATE OF BIRTH:  Jul 18, 1955  DATE OF ADMISSION:  06/13/2014 DATE OF DISCHARGE:  06/14/2014  DISCHARGE DIAGNOSES:  1.  Elevated troponin, stable likely due to bronchitis.  2.  Acute bronchitis.  3.  Schizoaffective disorder.  4.  Weakness, fall and rhabdomyolysis.   CONDITION ON DISCHARGE: Stable.   CODE STATUS: Full code.  DISCHARGE MEDICATIONS:   1.  Align 4 mg oral capsule once a day.  2.  Omeprazole 20 mg delayed-release once a day.  3.  Clozapine 100 mg oral tablet once a day.  4.  Meloxicam 15 mg oral tablet once a day as needed for pain.  5.  Albuterol 2 puffs inhalation 4 times a day.  6.  Doxycycline 100 mg oral every 12 hours for 4 days.  7.  Tamsulosin Flomax 0.4 mg oral once a day.   DISCHARGE INSTRUCTIONS: Advised to have physical therapy nurse and psychiatric nurse.   DIET: Low-sodium.  FOLLOW UP:  Advised to follow up in 1-2 weeks with primary care physician, Dr. Toy Cookey.   HISTORY OF PRESENT ILLNESS: A 59 year old male having some trouble with his legs. They have jerking and buckling and could not stand the day before presentation, so he was on floor for 3 hours according to his wife. She did not hear him.  Finally he crawled into the bathroom and was lying on the floor, made it back out to the living room, hit is head with the fall. He also was having fever and he thinks he had some bronchitis with coughing. He ended up calling paramedics 2 weeks ago also and he saw Dr. Juliann Pares in the office. He did an echocardiogram to rule out any cardiac events because of that. Troponin noted to be borderline and so given to hospitalist for all of these complains for admission and further management.   HOSPITAL COURSE AND STAY:  1.  Elevated troponin. Troponin was 0.06 on admission, most likely it was secondary to respiratory issues as with bronchitis, which remain negative on further follow-up, so no further cardiac work-up was  needed as he already saw Dr. Juliann Pares 2 weeks ago.  2.  Acute bronchitis. We started on doxycycline and continued DuoNeb and we discharged on the same at home.  3.  Rhabdomyolysis. His CK level was high on admission and with IV fluid it gradually came down, most likely it was because of his fall and staying on the floor for a few hours. 4.  Fall. Physical therapy evaluation was done and they suggested short-term rehabilitation initially but later on in his condition there was improvement and so PT suggested home health aide. We arranged for that and home visiting physical therapy.  5.  Pain in the right hip and right shoulder area after fall. X-rays were done. There was no fracture.  6.  Gastroesophageal reflux disease. He was on omeprazole as outpatient. Continue Protonix in hospital.  7.  Chronic kidney disease stage 3 improved with IV fluid. His creatinine came down to 1.08 so there was no chronic kidney disease.  8.  Increase liver function test. Most likely it was due to release of enzymes from muscles, remained stable.   CONSULTS IN THE HOSPITAL: None.  IMPORTANT LABORATORY RESULTS: On admission WBC 13,000, hemoglobin 14, platelet count 135,000, MCV is 94. Glucose 90, creatinine 1.36, sodium 140, potassium 3.7, chloride 105, and CO2 of 27. Calcium 9.1. Troponin 0.06. CK total was 4160. Troponin remained stable at 0.05.  Chest x-ray, PA and lateral, was done, which showed stable chronic central bronchial bronchitic thickening, no acute cardiopulmonary abnormalities seen. Urinalysis was grossly negative. On further follow-up CK level came up to 5471 so he was admitted to the hospital and monitor next day 5009.  Creatinine is stable at 0.95 and 0.98. CK total came down to 3399 on 06/04/2014 and 2880 on 06/14/2014.   TOTAL TIME SPENT ON THIS DISCHARGE: 40 minutes.     ____________________________ Hope PigeonVaibhavkumar G. Elisabeth PigeonVachhani, MD vgv:mc D: 06/18/2014 14:00:33 ET T: 06/18/2014 15:41:51  ET JOB#: [id number]  cc: Hope PigeonVaibhavkumar G. Elisabeth PigeonVachhani, MD, <Dictator> Serita ShellerErnest B. Maryellen PileEason, MD Altamese DillingVAIBHAVKUMAR Kasondra Junod MD ELECTRONICALLY SIGNED 07/01/2014 13:00

## 2014-08-18 NOTE — H&P (Signed)
PATIENT NAME:  Austin BailiffROBERTS, Austin MR#:  [id number] DATE OF BIRTH:  October 09, 1955  DATE OF ADMISSION:  06/11/2014  PRIMARY CARE PHYSICIAN: Alden ServerErnest B. Maryellen PileEason, MD   CARDIOLOGIST: [name], MD    CHIEF COMPLAINT: Trouble with his legs.   HISTORY OF PRESENT ILLNESS: This is a 59 year old male who has been having some trouble with his legs. They have been jerking and buckling underneath him. He could not stand yesterday, and he was on the floor for 3 hours. He was calling for his wife, but she did not hear him. He crawled into the bathroom and was lying on the floor. He made it back out to the living room. He said he hit his head with the fall. He is also having a fever, and he thinks he has bronchitis. He ended up calling the paramedics 2 weeks ago. He felt like he had an MI. He saw Dr. [name] in his office, and he took an echocardiogram. The ER physician asked for admission with a borderline troponin at 0.06.   PAST MEDICAL HISTORY: Bipolar disorder, schizoaffective disorder, diverticulitis, thyroid issue, gastroesophageal reflux disease, chronic kidney disease.   PAST SURGICAL HISTORY: Appendectomy.   ALLERGIES: HALDOL AND [employer].   MEDICATIONS: Include: Align 4 mg 1 capsule daily, clozapine 100 mg in the morning and 200 mg at night, meloxicam 15 mg as needed for pain, omeprazole 20 mg daily.   SOCIAL HISTORY: Quit smoking 2 days ago. No alcohol. No drug use. Unemployed. Lives with his wife.   FAMILY HISTORY: Father living, age 61. He has heart disease. Mother died of unknown medical issues.   REVIEW OF SYSTEMS:  CONSTITUTIONAL: Positive for fever. Positive for chills. Positive for sweats. Positive for weight loss of 14 pounds. Positive for weakness.  EYES: He does have wear glasses.  EARS, NOSE, MOUTH AND THROAT: Positive for postnasal drip. Positive for sore throat.  CARDIOVASCULAR: No current chest pain.  RESPIRATORY: Positive for shortness of breath. Positive for cough when he  swallows his phlegm.  GASTROINTESTINAL: Positive for abdominal pain. No nausea. No vomiting. No diarrhea. No constipation. No bright red blood per rectum. No melena.  GENITOURINARY: No burning on urination. No hematuria.  MUSCULOSKELETAL: Positive for right arm pain and right hip pain.  INTEGUMENT: No rashes or eruptions.  NEUROLOGIC: No fainting or blackouts.  PSYCHIATRIC: On medication for schizoaffective.  ENDOCRINE: The patient states that he has a thyroid issue.  HEMATOLOGIC AND LYMPHATIC: No anemia. No easy bruising or bleeding.   PHYSICAL EXAMINATION: VITAL SIGNS: Temperature 98.6, pulse 103, respirations 23, blood pressure 146/67, pulse oximetry 94% on room air.  GENERAL: No respiratory distress.  EYES: Conjunctivae and lids normal. Pupils equal, round, and reactive to light. Extraocular muscles intact. No nystagmus.  EARS, NOSE, AND THROAT: Tympanic membranes: No erythema. Nasal mucosa: No erythema. Throat: No erythema, no exudate seen. Lips and gums: No lesions.  NECK: No JVD. No bruits. No lymphadenopathy. No thyromegaly. No thyroid nodules palpated.  RESPIRATORY: Lungs decreased breath sounds bilaterally. Slight expiratory wheeze.  CARDIOVASCULAR SYSTEM: S1, S2 normal. No gallops, rubs, or murmurs heard. Carotid upstroke 2+ bilaterally. No bruits. Dorsalis pedis pulses 2+ bilaterally. Trace edema of the lower extremities.  ABDOMEN: Soft, nontender. No organosplenomegaly. Normoactive bowel sounds. No masses felt.  LYMPHATIC: No lymph nodes in the neck.  MUSCULOSKELETAL: Trace edema. No clubbing. No cyanosis.  SKIN: No ulcers or lesions seen.  NEUROLOGICAL: Cranial nerves II through XII grossly intact. Deep tendon reflexes 1+ bilateral lower extremities.  Power 5/5 upper and lower extremities. Pain with motion on the right shoulder. Also, the patient had pain in the right hip area, with movement of the right hip, but I was able to move with passive range of motion and no pain to  palpating over the right hip.  PSYCHIATRIC: The patient is alert and oriented to person and place.   LABORATORY AND RADIOLOGICAL DATA: Chest x-ray: Stable, chronic central bronchiectatic thickening. Humerus is intact. No dislocation seen. Mild irregularity noted at the radial head, although incompletely evaluated. CT scan of the head: Atrophy, minimal small vessel disease. New mastoid opacification of the right with several small air fluid levels in the mastoid air cells. White blood 13.0, hemoglobin and hematocrit 14.0 and 41.1, platelet count of 135, 000, glucose 90, BUN 9, creatinine 1.36, sodium 140, potassium 3.7, chloride 105, CO2 of 27, calcium 9.1, total bilirubin 0.5, alkaline phosphatase 71, ALT 15, AST 97, total-protein 6.9. Troponin borderline at 0.06, lipase 93. EKG: Normal sinus rhythm 100 beats per minute.   ASSESSMENT AND PLAN: 1. Elevated troponin: Asked to see the patient for a troponin of borderline at 0.06. This could be secondary to respiratory issues with bronchitis. We will get serial cardiac enzymes and put on telemetry.  2. Bronchitis. We will give doxycycline, DuoNeb, and budesonide nebulizers.  3. Bipolar and schizoaffective disorder. Continue Clozaril.  4. Falls. I will get a physical therapy evaluation and see how he does walking around.  5. Pain in the right hip and right shoulder area. X-ray of the humerus looked okay. I will get a pelvis x-ray and a right shoulder x-ray.  6. Gastroesophageal reflux disease. The patient is on omeprazole as outpatient, continue Protonix while here.  7. Chronic kidney disease stage III. We will give IV fluid hydration and see if this improves.  8. Increased liver function tests. We will recheck in the morning after hydration.   TIME SPENT ON ADMISSION: 55 minutes.   CODE STATUS: The patient is a full code.    ____________________________ Herschell Dimes. Renae Gloss, MD rjw:mw D: 06/11/2014 18:04:11 ET T: 06/11/2014 18:46:37  ET JOB#: [id number]  cc: Herschell Dimes. Renae Gloss, MD, <Dictator> Serita Sheller. Maryellen Pile, MD Dwayne D. Juliann Pares, MD  Salley Scarlet MD ELECTRONICALLY SIGNED 06/26/2014 10:45

## 2018-02-20 ENCOUNTER — Other Ambulatory Visit: Payer: Self-pay

## 2018-02-20 ENCOUNTER — Emergency Department
Admission: EM | Admit: 2018-02-20 | Discharge: 2018-02-20 | Disposition: A | Discharge status: Home / Self Care | Payer: Medicare Other | Attending: Emergency Medicine | Admitting: Emergency Medicine

## 2018-02-20 DIAGNOSIS — F17210 Nicotine dependence, cigarettes, uncomplicated: Secondary | ICD-10-CM | POA: Diagnosis not present

## 2018-02-20 DIAGNOSIS — G2401 Drug induced subacute dyskinesia: Secondary | ICD-10-CM | POA: Diagnosis not present

## 2018-02-20 DIAGNOSIS — J449 Chronic obstructive pulmonary disease, unspecified: Secondary | ICD-10-CM | POA: Insufficient documentation

## 2018-02-20 DIAGNOSIS — R531 Weakness: Secondary | ICD-10-CM | POA: Diagnosis present

## 2018-02-20 DIAGNOSIS — M6281 Muscle weakness (generalized): Secondary | ICD-10-CM | POA: Insufficient documentation

## 2018-02-20 DIAGNOSIS — F250 Schizoaffective disorder, bipolar type: Secondary | ICD-10-CM | POA: Insufficient documentation

## 2018-02-20 DIAGNOSIS — N289 Disorder of kidney and ureter, unspecified: Secondary | ICD-10-CM | POA: Diagnosis not present

## 2018-02-20 DIAGNOSIS — E860 Dehydration: Secondary | ICD-10-CM | POA: Diagnosis not present

## 2018-02-20 HISTORY — DX: Sleep apnea, unspecified: G47.30

## 2018-02-20 HISTORY — DX: Disorder of thyroid, unspecified: E07.9

## 2018-02-20 HISTORY — DX: Chronic obstructive pulmonary disease, unspecified: J44.9

## 2018-02-20 HISTORY — DX: Drug induced subacute dyskinesia: G24.01

## 2018-02-20 HISTORY — DX: Schizoaffective disorder, bipolar type: F25.0

## 2018-02-20 LAB — CBC
HCT: 41.5 % (ref 39.0–52.0)
Hemoglobin: 13.9 g/dL (ref 13.0–17.0)
MCH: 32.6 pg (ref 26.0–34.0)
MCHC: 33.5 g/dL (ref 30.0–36.0)
MCV: 97.2 fL (ref 80.0–100.0)
Platelets: 148 10*3/uL — ABNORMAL LOW (ref 150–400)
RBC: 4.27 MIL/uL (ref 4.22–5.81)
RDW: 14.8 % (ref 11.5–15.5)
WBC: 12.4 10*3/uL — ABNORMAL HIGH (ref 4.0–10.5)
nRBC: 0 % (ref 0.0–0.2)

## 2018-02-20 LAB — BASIC METABOLIC PANEL
Anion gap: 8 (ref 5–15)
BUN: 11 mg/dL (ref 8–23)
CO2: 23 mmol/L (ref 22–32)
Calcium: 8.7 mg/dL — ABNORMAL LOW (ref 8.9–10.3)
Chloride: 112 mmol/L — ABNORMAL HIGH (ref 98–111)
Creatinine, Ser: 1.8 mg/dL — ABNORMAL HIGH (ref 0.61–1.24)
GFR calc Af Amer: 44 mL/min — ABNORMAL LOW (ref >60)
GFR calc non Af Amer: 38 mL/min — ABNORMAL LOW (ref >60)
Glucose, Bld: 133 mg/dL — ABNORMAL HIGH (ref 70–99)
Potassium: 4.2 mmol/L (ref 3.5–5.1)
Sodium: 143 mmol/L (ref 135–145)

## 2018-02-20 MED ORDER — SODIUM CHLORIDE 0.9 % IV BOLUS
1000.0000 mL | Freq: Once | INTRAVENOUS | Status: AC
  Administered 2018-02-20: 1000 mL via INTRAVENOUS

## 2018-02-20 NOTE — ED Triage Notes (Signed)
Pt comes into the ED via EMS from a group home, states he was walking with his walker at [employer] and became weak and fell to the floor. Denies any pain or injury from the fall on arrival. Pt states he has been feeling weak for a while nothing new today. Pt is a/ox4 on arrival.

## 2018-02-20 NOTE — ED Provider Notes (Addendum)
[facility] Emergency Department Provider Note  ____________________________________________  Time seen: Approximately 2:32 PM  I have reviewed the triage vital signs and the nursing notes.   HISTORY  Chief Complaint Weakness    HPI Austin Marsh is a 62 y.o. male with a history of COPD schizoaffective disorder and tardive dyskinesia who comes to the ED from EMS stating that he was walking with his walker at [facility] and he became weak and fell to the floor.  He reports that this happens time to time and he is not concerned about it but they wanted him evaluated.  Denies any prodromal symptoms, no chest pain shortness of breath belly pain fevers chills sweats or other recent illness.  Denies head injury or loss of consciousness.  States that he feels fine and eager to go back home.  Eating and drinking normally.  Denies vomiting or diarrhea.      Past Medical History:  Diagnosis Date  . COPD (chronic obstructive pulmonary disease) (HCC)   . Schizoaffective disorder, bipolar type (HCC)   . Sleep apnea   . Tardive dyskinesia   . Thyroid disease      There are no active problems to display for this patient.    Past Surgical History:  Procedure Laterality Date  . APPENDECTOMY       Prior to Admission medications   Not on File     Allergies Haldol [haloperidol lactate]   No family history on file.  Social History Social History   Tobacco Use  . Smoking status: Current Every Day Smoker    Types: Cigarettes  . Smokeless tobacco: Never Used  Substance Use Topics  . Alcohol use: Not Currently  . Drug use: Not Currently    Review of Systems  Constitutional:   No fever or chills.  ENT:   No sore throat. No rhinorrhea. Cardiovascular:   No chest pain or syncope. Respiratory:   No dyspnea or cough. Gastrointestinal:   Negative for abdominal pain, vomiting and diarrhea.  Musculoskeletal:   Chronic low back pain, unchanged All other  systems reviewed and are negative except as documented above in ROS and HPI.  ____________________________________________   PHYSICAL EXAM:  VITAL SIGNS: ED Triage Vitals  Enc Vitals Group     BP 02/20/18 1338 103/61     Pulse Rate 02/20/18 1338 92     Resp 02/20/18 1338 18     Temp 02/20/18 1331 97.7 F (36.5 C)     Temp Source 02/20/18 1331 Oral     SpO2 02/20/18 1338 95 %     Weight 02/20/18 1339 250 lb (113.4 kg)     Height 02/20/18 1339 5\' 9"  (1.753 m)     Head Circumference --      Peak Flow --      Pain Score 02/20/18 1338 1     Pain Loc --      Pain Edu? --      Excl. in GC? --     Vital signs reviewed, nursing assessments reviewed.   Constitutional:   Alert and oriented. Non-toxic appearance. Eyes:   Conjunctivae are normal. EOMI. PERRL. ENT      Head:   Normocephalic and atraumatic.      Nose:   No congestion/rhinnorhea.       Mouth/Throat:   MMM, no pharyngeal erythema. No peritonsillar mass.       Neck:   No meningismus. Full ROM. Hematological/Lymphatic/Immunilogical:   No cervical lymphadenopathy. Cardiovascular:   RRR.  Symmetric bilateral radial and DP pulses.  No murmurs. Cap refill less than 2 seconds. Respiratory:   Normal respiratory effort without tachypnea/retractions. Breath sounds are clear and equal bilaterally. No wheezes/rales/rhonchi. Gastrointestinal:   Soft and nontender. Non distended. There is no CVA tenderness.  No rebound, rigidity, or guarding. Musculoskeletal:   Normal range of motion in all extremities. No joint effusions.  No lower extremity tenderness.  No edema. Neurologic:   Normal speech and language.  Motor grossly intact. Rhythmic mouth movements consistent with tardive dyskinesia No acute focal neurologic deficits are appreciated.  Skin:    Skin is warm, dry and intact. No rash noted.  No petechiae, purpura, or bullae.  ____________________________________________    LABS (pertinent positives/negatives) (all labs ordered  are listed, but only abnormal results are displayed) Labs Reviewed  BASIC METABOLIC PANEL - Abnormal; Notable for the following components:      Result Value   Chloride 112 (*)    Glucose, Bld 133 (*)    Creatinine, Ser 1.80 (*)    Calcium 8.7 (*)    GFR calc non Af Amer 38 (*)    GFR calc Af Amer 44 (*)    All other components within normal limits  CBC - Abnormal; Notable for the following components:   WBC 12.4 (*)    Platelets 148 (*)    All other components within normal limits  URINALYSIS, COMPLETE (UACMP) WITH MICROSCOPIC  CBG MONITORING, ED   ____________________________________________   EKG  Interpreted by me  Date: 02/20/2018  Rate: 89  Rhythm: normal sinus rhythm  QRS Axis: normal  Intervals: normal  ST/T Wave abnormalities: normal  Conduction Disutrbances: none  Narrative Interpretation: unremarkable      ____________________________________________    RADIOLOGY  No results found.  ____________________________________________   PROCEDURES Procedures  ____________________________________________    CLINICAL IMPRESSION / ASSESSMENT AND PLAN / ED COURSE  Pertinent labs & imaging results that were available during my care of the patient were reviewed by me and considered in my medical decision making (see chart for details).    Patient well-appearing, at apparent baseline.  Had an episode of weakness earlier today with a fall, but no significant injuries.  No acute symptoms at present time.  EKG is normal.  Vital signs are normal.  Will check labs, give IV fluids for hydration, plan for discharge home and outpatient follow-up if work-up is reassuring.  Clinical Course as of Feb 20 1514  Mon Feb 20, 2018  1506 Creatinine of 1.8 elevated from his baseline of 0.9.  We will follow-up urinalysis, fluids for hydration.  Creatinine(!): 1.80 [PS]    Clinical Course User Index [PS] Sharman Cheek, MD      ____________________________________________   FINAL CLINICAL IMPRESSION(S) / ED DIAGNOSES    Final diagnoses:  Generalized weakness  Dehydration  Acute renal insufficiency     ED Discharge Orders    None      Portions of this note were generated with dragon dictation software. Dictation errors may occur despite best attempts at proofreading.    Sharman Cheek, MD 02/20/18 1434    Sharman Cheek, MD 02/20/18 1515

## 2018-05-03 ENCOUNTER — Emergency Department: Payer: Medicare Other

## 2018-05-03 ENCOUNTER — Encounter: Payer: Self-pay | Admitting: Emergency Medicine

## 2018-05-03 ENCOUNTER — Emergency Department
Admission: EM | Admit: 2018-05-03 | Discharge: 2018-05-03 | Disposition: A | Discharge status: Home / Self Care | Payer: Medicare Other | Attending: Emergency Medicine | Admitting: Emergency Medicine

## 2018-05-03 DIAGNOSIS — N5089 Other specified disorders of the male genital organs: Secondary | ICD-10-CM

## 2018-05-03 DIAGNOSIS — N433 Hydrocele, unspecified: Secondary | ICD-10-CM | POA: Diagnosis not present

## 2018-05-03 DIAGNOSIS — J449 Chronic obstructive pulmonary disease, unspecified: Secondary | ICD-10-CM | POA: Diagnosis not present

## 2018-05-03 DIAGNOSIS — Z79899 Other long term (current) drug therapy: Secondary | ICD-10-CM | POA: Insufficient documentation

## 2018-05-03 DIAGNOSIS — F17210 Nicotine dependence, cigarettes, uncomplicated: Secondary | ICD-10-CM | POA: Diagnosis not present

## 2018-05-03 DIAGNOSIS — N50812 Left testicular pain: Secondary | ICD-10-CM | POA: Diagnosis present

## 2018-05-03 MED ORDER — SULFAMETHOXAZOLE-TRIMETHOPRIM 800-160 MG PO TABS: 1.0000 | ORAL_TABLET | Freq: Two times a day (BID) | ORAL | 0 refills | Status: AC

## 2018-05-03 MED ORDER — SULFAMETHOXAZOLE-TRIMETHOPRIM 800-160 MG PO TABS
1.0000 | ORAL_TABLET | Freq: Once | ORAL | Status: AC
  Administered 2018-05-03: 1 via ORAL
  Filled 2018-05-03: qty 1

## 2018-05-03 MED ORDER — TRAMADOL HCL 50 MG PO TABS: 50.0000 mg | ORAL_TABLET | Freq: Four times a day (QID) | ORAL | 0 refills | Status: AC | PRN

## 2018-05-03 NOTE — Discharge Instructions (Signed)
Take antibiotics as prescribed fully.  Follow-up in a week with urology.  Return to the emergency room if you have progression of the swelling, if you develop redness of the testicle, if you have worsening pain.

## 2018-05-03 NOTE — ED Triage Notes (Signed)
Patient presents to the ED with swollen left testicle since Monday.  Patient states, "It's a big hard lump".  Patient reports intermittent pain to the area.  Patient is in no obvious distress at this time.

## 2018-05-03 NOTE — ED Provider Notes (Signed)
[facility] Emergency Department Provider Note  ____________________________________________  Time seen: Approximately 3:00 PM  I have reviewed the triage vital signs and the nursing notes.   HISTORY  Chief Complaint Groin Swelling   HPI Austin Marsh is a 63 y.o. male with a history of schizoaffective disorder and COPD who presents for evaluation of left testicular pain.  Patient reports a week of swelling and pain in the left testicle.  The pain is moderate, constant and nonradiating.  No penile discharge, no dysuria or hematuria, no abdominal pain, no fever or chills.  Patient denies any trauma to the testicle.   Past Medical History:  Diagnosis Date  . COPD (chronic obstructive pulmonary disease) (HCC)   . Schizoaffective disorder, bipolar type (HCC)   . Sleep apnea   . Tardive dyskinesia   . Thyroid disease     There are no active problems to display for this patient.   Past Surgical History:  Procedure Laterality Date  . APPENDECTOMY      Prior to Admission medications   Medication Sig Start Date End Date Taking? Authorizing Provider  acetaminophen (TYLENOL) 325 MG tablet Take 650 mg by mouth every 4 (four) hours as needed.   Yes [provider]  alum & mag hydroxide-simeth (MAALOX/MYLANTA) 200-200-20 MG/5ML suspension Take 30 mLs by mouth daily as needed for indigestion or heartburn.   Yes [provider]  benztropine (COGENTIN) 2 MG tablet Take 2 mg by mouth 2 (two) times daily.   Yes [provider]  bismuth subsalicylate (PEPTO BISMOL) 262 MG/15ML suspension Take 15 mLs by mouth every 4 (four) hours as needed.   Yes [provider]  busPIRone (BUSPAR) 15 MG tablet Take 15 mg by mouth 2 (two) times daily.   Yes [provider]  cloZAPine (CLOZARIL) 100 MG tablet Take 100-200 mg by mouth daily. Take 100 mg in the morning and 200 mg at bedtime for scizophrenia   Yes [provider]    divalproex (DEPAKOTE ER) 500 MG 24 hr tablet Take 1,000 mg by mouth at bedtime.   Yes [provider]  guaiFENesin (ROBITUSSIN) 100 MG/5ML liquid Take 200 mg by mouth every 6 (six) hours as needed for cough.   Yes [provider]  levothyroxine (SYNTHROID, LEVOTHROID) 75 MCG tablet Take 75 mcg by mouth daily before breakfast.   Yes [provider]  loperamide (IMODIUM) 2 MG capsule Take 2 mg by mouth as needed for diarrhea or loose stools.   Yes [provider]  magnesium hydroxide (MILK OF MAGNESIA) 400 MG/5ML suspension Take 30 mLs by mouth daily as needed for mild constipation.   Yes [provider]  neomycin-bacitracin-polymyxin (NEOSPORIN) ointment Apply 1 application topically as needed for wound care.   Yes [provider]  omeprazole (PRILOSEC) 20 MG capsule Take 20 mg by mouth daily.   Yes [provider]  polyethylene glycol (MIRALAX / GLYCOLAX) packet Take 17 g by mouth daily.   Yes [provider]  tamsulosin (FLOMAX) 0.4 MG CAPS capsule Take 0.4 mg by mouth 2 (two) times daily.   Yes [provider]  Vitamin D, Cholecalciferol, 25 MCG (1000 UT) TABS Take 25 mcg by mouth daily.   Yes [provider]  sulfamethoxazole-trimethoprim (BACTRIM DS,SEPTRA DS) 800-160 MG tablet Take 1 tablet by mouth 2 (two) times daily for 10 days. 05/03/18 05/13/18  Nita Sickle, MD  traMADol (ULTRAM) 50 MG tablet Take 1 tablet (50 mg total) by mouth every  6 (six) hours as needed. 05/03/18 05/03/19  Nita SickleVeronese, [place], MD    Allergies Haldol [haloperidol lactate]; Other; Thorazine [chlorpromazine]; and Trazodone and nefazodone  No family history on file.  Social History Social History   Tobacco Use  . Smoking status: Current Every Day Smoker    Types: Cigarettes  . Smokeless tobacco: Never Used  Substance Use Topics  . Alcohol use: Not Currently  . Drug use: Not Currently    Review of  Systems  Constitutional: Negative for fever. Eyes: Negative for visual changes. ENT: Negative for sore throat. Neck: No neck pain  Cardiovascular: Negative for chest pain. Respiratory: Negative for shortness of breath. Gastrointestinal: Negative for abdominal pain, vomiting or diarrhea. Genitourinary: Negative for dysuria. + Left testicular pain Musculoskeletal: Negative for back pain. Skin: Negative for rash. Neurological: Negative for headaches, weakness or numbness. Psych: No SI or HI  ____________________________________________   PHYSICAL EXAM:  VITAL SIGNS: ED Triage Vitals  Enc Vitals Group     BP 05/03/18 1036 101/60     Pulse Rate 05/03/18 1036 95     Resp 05/03/18 1036 18     Temp 05/03/18 1036 98 F (36.7 C)     Temp Source 05/03/18 1036 Oral     SpO2 05/03/18 1036 95 %     Weight 05/03/18 1044 195 lb 1.7 oz (88.5 kg)     Height 05/03/18 1037 5\' 9"  (1.753 m)     Head Circumference --      Peak Flow --      Pain Score 05/03/18 1037 4     Pain Loc --      Pain Edu? --      Excl. in GC? --     Constitutional: Alert and oriented. Well appearing and in no apparent distress. HEENT:      Head: Normocephalic and atraumatic.         Eyes: Conjunctivae are normal. Sclera is non-icteric.       Mouth/Throat: Mucous membranes are moist.       Neck: Supple with no signs of meningismus. Cardiovascular: Regular rate and rhythm. No murmurs, gallops, or rubs. 2+ symmetrical distal pulses are present in all extremities. No JVD. Respiratory: Normal respiratory effort. Lungs are clear to auscultation bilaterally. No wheezes, crackles, or rhonchi.  Gastrointestinal: Soft, non tender, and non distended with positive bowel sounds. No rebound or guarding. Genitourinary: No CVA tenderness. Bilateral testicles are descended, L scrotum is enlarged, hardened with mild testicular tenderness to palpation, no erythema, crepitus or bullae of the scrotum. No evidence of inguinal  hernia. Musculoskeletal: Nontender with normal range of motion in all extremities. No edema, cyanosis, or erythema of extremities. Neurologic: Normal speech and language. Face is symmetric. Moving all extremities. No gross focal neurologic deficits are appreciated. Skin: Skin is warm, dry and intact. No rash noted. Psychiatric: Mood and affect are normal. Speech and behavior are normal.  ____________________________________________   LABS (all labs ordered are listed, but only abnormal results are displayed)  Labs Reviewed - No data to display ____________________________________________  EKG  none  ____________________________________________  RADIOLOGY  I have personally reviewed the images performed during this visit and I agree with the Radiologist's read.   Interpretation by Radiologist:  [place] Scrotum  Result Date: 05/03/2018 CLINICAL DATA:  Left testicular pain and swelling for the past week. EXAM: SCROTAL ULTRASOUND DOPPLER ULTRASOUND OF THE TESTICLES TECHNIQUE: Complete ultrasound examination of the testicles, epididymis, and other scrotal structures was performed. Color and spectral Doppler ultrasound  were also utilized to evaluate blood flow to the testicles. COMPARISON:  None. FINDINGS: Right testicle Measurements: 3.7 x 2.1 x 1.9 cm. No mass or microlithiasis visualized. Left testicle Measurements: 4.7 x 2.5 x 1.6 cm. No mass or microlithiasis visualized. Right epididymis: Normal in size and appearance. Small epididymal cysts measuring up to 5 mm. Left epididymis:  Normal in size and appearance. Hydrocele: Minimally complicated large left hydrocele with a few thin internal septations. Small right hydrocele. Varicocele:  None visualized. Pulsed Doppler interrogation of both testes demonstrates normal low resistance arterial and venous waveforms bilaterally. IMPRESSION: 1. Minimally complicated large left hydrocele. 2. Small right hydrocele. 3. Normal sonographic appearance of the  testicles. Electronically Signed   By: Obie Dredge M.D.   On: 05/03/2018 11:58   US Scrotum Doppler  Result Date: 05/03/2018 CLINICAL DATA:  Left testicular pain and swelling for the past week. EXAM: SCROTAL ULTRASOUND DOPPLER ULTRASOUND OF THE TESTICLES TECHNIQUE: Complete ultrasound examination of the testicles, epididymis, and other scrotal structures was performed. Color and spectral Doppler ultrasound were also utilized to evaluate blood flow to the testicles. COMPARISON:  None. FINDINGS: Right testicle Measurements: 3.7 x 2.1 x 1.9 cm. No mass or microlithiasis visualized. Left testicle Measurements: 4.7 x 2.5 x 1.6 cm. No mass or microlithiasis visualized. Right epididymis: Normal in size and appearance. Small epididymal cysts measuring up to 5 mm. Left epididymis:  Normal in size and appearance. Hydrocele: Minimally complicated large left hydrocele with a few thin internal septations. Small right hydrocele. Varicocele:  None visualized. Pulsed Doppler interrogation of both testes demonstrates normal low resistance arterial and venous waveforms bilaterally. IMPRESSION: 1. Minimally complicated large left hydrocele. 2. Small right hydrocele. 3. Normal sonographic appearance of the testicles. Electronically Signed   By: Obie Dredge M.D.   On: 05/03/2018 11:58     ____________________________________________   PROCEDURES  Procedure(s) performed: None Procedures Critical Care performed:  None ____________________________________________   INITIAL IMPRESSION / ASSESSMENT AND PLAN / ED COURSE   63 y.o. male with a history of schizoaffective disorder and COPD who presents for evaluation of left testicular pain.  Ultrasound concerning for moderate complicated left hydrocele.  Discussed with Dr. Vanna Scotland who recommended putting patient on antibiotics since he is having pain and outpatient follow-up.  There are no signs of torsion.  Discussed management of follow-up with patient.   Discussed return precautions for signs of torsion.      As part of my medical decision making, I reviewed the following data within the electronic MEDICAL RECORD NUMBER Nursing notes reviewed and incorporated, Old chart reviewed, Radiograph reviewed , A consult was requested and obtained from this/these consultant(s) Urology, Notes from prior ED visits and [place] Controlled Substance Database    Pertinent labs & imaging results that were available during my care of the patient were reviewed by me and considered in my medical decision making (see chart for details).    ____________________________________________   FINAL CLINICAL IMPRESSION(S) / ED DIAGNOSES  Final diagnoses:  Left hydrocele      NEW MEDICATIONS STARTED DURING THIS VISIT:  ED Discharge Orders         Ordered    sulfamethoxazole-trimethoprim (BACTRIM DS,SEPTRA DS) 800-160 MG tablet  2 times daily     05/03/18 1507    traMADol (ULTRAM) 50 MG tablet  Every 6 hours PRN     05/03/18 1507           Note:  This document was prepared using Dragon voice recognition  software and may include unintentional dictation errors.    Don PerkingVeronese, [place], MD 05/03/18 [phone]

## 2018-06-20 ENCOUNTER — Encounter: Payer: Self-pay | Admitting: Emergency Medicine

## 2018-06-20 ENCOUNTER — Emergency Department
Admission: EM | Admit: 2018-06-20 | Discharge: 2018-06-21 | Disposition: A | Discharge status: Home / Self Care | Payer: Medicare Other | Attending: Emergency Medicine | Admitting: Emergency Medicine

## 2018-06-20 DIAGNOSIS — S92315A Nondisplaced fracture of first metatarsal bone, left foot, initial encounter for closed fracture: Secondary | ICD-10-CM | POA: Diagnosis not present

## 2018-06-20 DIAGNOSIS — F17210 Nicotine dependence, cigarettes, uncomplicated: Secondary | ICD-10-CM | POA: Insufficient documentation

## 2018-06-20 DIAGNOSIS — R296 Repeated falls: Secondary | ICD-10-CM | POA: Insufficient documentation

## 2018-06-20 DIAGNOSIS — J449 Chronic obstructive pulmonary disease, unspecified: Secondary | ICD-10-CM | POA: Insufficient documentation

## 2018-06-20 DIAGNOSIS — R531 Weakness: Secondary | ICD-10-CM | POA: Insufficient documentation

## 2018-06-20 DIAGNOSIS — Y939 Activity, unspecified: Secondary | ICD-10-CM | POA: Diagnosis not present

## 2018-06-20 DIAGNOSIS — Z79899 Other long term (current) drug therapy: Secondary | ICD-10-CM | POA: Insufficient documentation

## 2018-06-20 DIAGNOSIS — W010XXA Fall on same level from slipping, tripping and stumbling without subsequent striking against object, initial encounter: Secondary | ICD-10-CM | POA: Insufficient documentation

## 2018-06-20 DIAGNOSIS — W19XXXA Unspecified fall, initial encounter: Secondary | ICD-10-CM

## 2018-06-20 DIAGNOSIS — Y929 Unspecified place or not applicable: Secondary | ICD-10-CM | POA: Diagnosis not present

## 2018-06-20 DIAGNOSIS — Y999 Unspecified external cause status: Secondary | ICD-10-CM | POA: Diagnosis not present

## 2018-06-20 DIAGNOSIS — S99922A Unspecified injury of left foot, initial encounter: Secondary | ICD-10-CM | POA: Diagnosis present

## 2018-06-20 NOTE — ED Notes (Signed)
GROUP HOME [address] [place] [address]  CALL [place]  [phone]

## 2018-06-20 NOTE — ED Triage Notes (Signed)
Pt in via EMS from a [facility]. Hist of schizophrenia. Recent weakness and falls d/t knees giving out per pt. Pt states "I can't remember if I hit my head today when I fell."

## 2018-06-20 NOTE — ED Triage Notes (Signed)
Pt arrived via EMS from group home where report is that pt was walking fast with walker, "shuffling," when he fell. Pt c/o lower extremity pain as well as weakness. Pt has hx/o schizophrenia. Pt is calm and cooperative.

## 2018-06-20 NOTE — ED Provider Notes (Signed)
[facility] Emergency Department Provider Note  ____________________________________________   First MD Initiated Contact with Patient 06/20/18 2331     (approximate)  I have reviewed the triage vital signs and the nursing notes.   HISTORY  Chief Complaint Fall and Weakness  Level 5 caveat:  history/ROS limited by patient being a vague historian and having severe chronic mental illness that limits his ability to give a thorough and accurate history.  HPI Austin Marsh is a 63 y.o. male with medical and psychiatric history as listed below who takes multiple psychiatric medications including Clozaril.  He presents  by EMS from his family care/group home for evaluation of multiple falls recently.  The initial chief complaint was for generalized weakness but the patient has multiple complaints including pain in his left foot, cough with some mild associated shortness of breath, and he reports pain "all over my body" but states that the pain is been there for years and that no one has been able to tell him why he has the pain.  He thinks he was recently started on an antibiotic but he does not know why.  He denies being aware of any other medication changes recently.  He agrees that he has been falling recently but also indicates that this is been going on for a long period of time.  He is not sure if he hit his head or not and reports pain in his head and neck as well as the rest of his body but most specifically in his left foot.  He states he thinks he may have broken his foot because of his falls.  He describes the symptoms as moderate to severe and moving around makes them worse and nothing in particular makes them better.  He has no suicidal ideation or homicidal ideation and denies having any hallucinations.  He is calm and cooperative.        Past Medical History:  Diagnosis Date  . COPD (chronic obstructive pulmonary disease) (HCC)   . Schizoaffective  disorder, bipolar type (HCC)   . Sleep apnea   . Tardive dyskinesia   . Thyroid disease     There are no active problems to display for this patient.   Past Surgical History:  Procedure Laterality Date  . APPENDECTOMY      Prior to Admission medications   Medication Sig Start Date End Date Taking? Authorizing Provider  acetaminophen (TYLENOL) 325 MG tablet Take 650 mg by mouth every 4 (four) hours as needed.    [provider]  alum & mag hydroxide-simeth (MAALOX/MYLANTA) 200-200-20 MG/5ML suspension Take 30 mLs by mouth daily as needed for indigestion or heartburn.    [provider]  benztropine (COGENTIN) 2 MG tablet Take 2 mg by mouth 2 (two) times daily.    [provider]  bismuth subsalicylate (PEPTO BISMOL) 262 MG/15ML suspension Take 15 mLs by mouth every 4 (four) hours as needed.    [provider]  busPIRone (BUSPAR) 15 MG tablet Take 15 mg by mouth 2 (two) times daily.    [provider]  cloZAPine (CLOZARIL) 100 MG tablet Take 100-200 mg by mouth daily. Take 100 mg in the morning and 200 mg at bedtime for scizophrenia    [provider]  divalproex (DEPAKOTE ER) 500 MG 24 hr tablet Take 1,000 mg by mouth at bedtime.    [provider]  guaiFENesin (ROBITUSSIN) 100 MG/5ML liquid Take 200 mg by mouth every 6 (six) hours as  needed for cough.    [provider]  levothyroxine (SYNTHROID, LEVOTHROID) 75 MCG tablet Take 75 mcg by mouth daily before breakfast.    [provider]  loperamide (IMODIUM) 2 MG capsule Take 2 mg by mouth as needed for diarrhea or loose stools.    [provider]  magnesium hydroxide (MILK OF MAGNESIA) 400 MG/5ML suspension Take 30 mLs by mouth daily as needed for mild constipation.    [provider]  neomycin-bacitracin-polymyxin (NEOSPORIN) ointment Apply 1 application topically as needed for wound care.    [provider]  omeprazole (PRILOSEC)  20 MG capsule Take 20 mg by mouth daily.    [provider]  polyethylene glycol (MIRALAX / GLYCOLAX) packet Take 17 g by mouth daily.    [provider]  tamsulosin (FLOMAX) 0.4 MG CAPS capsule Take 0.4 mg by mouth 2 (two) times daily.    [provider]  traMADol (ULTRAM) 50 MG tablet Take 1 tablet (50 mg total) by mouth every 6 (six) hours as needed. 05/03/18 05/03/19  Nita Sickle, MD  Vitamin D, Cholecalciferol, 25 MCG (1000 UT) TABS Take 25 mcg by mouth daily.    [provider]    Allergies Haldol [haloperidol lactate]; Other; Thorazine [chlorpromazine]; and Trazodone and nefazodone  History reviewed. No pertinent family history.  Social History Social History   Tobacco Use  . Smoking status: Current Every Day Smoker    Types: Cigarettes  . Smokeless tobacco: Never Used  Substance Use Topics  . Alcohol use: Not Currently  . Drug use: Not Currently    Review of Systems Level 5 caveat:  history/ROS limited by patient being a vague historian and having severe chronic mental illness that limits his ability to give a thorough and accurate history.  See HPI for details.  The patient reports pain all over his body that is been going on for years but mostly pain in his left foot that is relatively new after some recent falls.  He also thinks he may have hit his head with some falls recently and reports a cough with some associated shortness of breath.  He denies chest pain and abdominal pain.  ____________________________________________   PHYSICAL EXAM:  VITAL SIGNS: ED Triage Vitals  Enc Vitals Group     BP 06/20/18 2325 123/81     Pulse Rate 06/20/18 2325 95     Resp 06/20/18 2325 18     Temp 06/20/18 2325 98.5 F (36.9 C)     Temp Source 06/20/18 2325 Oral     SpO2 06/20/18 2325 97 %     Weight --      Height 06/20/18 2331 1.753 m ( )     Head Circumference --      Peak Flow --      Pain Score 06/20/18 2330 1     Pain Loc  --      Pain Edu? --      Excl. in GC? --     Constitutional: Alert and oriented.  No acute distress. Eyes: Conjunctivae are normal.  Head: Atraumatic.  He has no evidence of any hematomas or abrasions.  He reports some tenderness when I touch essentially any part of his scalp. Nose: No congestion/rhinnorhea. Mouth/Throat: Mucous membranes are moist. Neck: No stridor.  No meningeal signs.   Cardiovascular: Normal rate, regular rhythm. Good peripheral circulation. Grossly normal heart sounds.  Respiratory: Normal respiratory effort.  No retractions. Lungs CTAB.  No cough that I observe  during my evaluation. Gastrointestinal: Soft and nontender. No distention.  Musculoskeletal: No lower extremity tenderness nor edema. No gross deformities of extremities.  He has no tenderness to palpation of his left foot but reports that he has pain at the base of his left big toe.  He has a very large and thick toenail but without any sign of active infection and no gross deformity of the extremity.  He is moving all of his extremities without any difficulty or reproducible pain. Neurologic: Patient has tardive dyskinesia at baseline which she is also demonstrating here in the emergency department.  Otherwise normal speech and language. No gross focal neurologic deficits are appreciated.  Skin:  Skin is warm, dry and intact. No rash noted. Psychiatric: Mood and affect are normal. Speech and behavior are normal.  Patient is calm and cooperative.  ____________________________________________   LABS (all labs ordered are listed, but only abnormal results are displayed)  Labs Reviewed  CBC WITH DIFFERENTIAL/PLATELET - Abnormal; Notable for the following components:      Result Value   WBC 12.8 (*)    RBC 4.20 (*)    Neutro Abs 9.0 (*)    Monocytes Absolute 1.2 (*)    Abs Immature Granulocytes 0.28 (*)    All other components within normal limits  COMPREHENSIVE METABOLIC PANEL - Abnormal; Notable for the  following components:   CO2 21 (*)    BUN 25 (*)    Creatinine, Ser 1.85 (*)    Calcium 8.8 (*)    Albumin 3.2 (*)    GFR calc non Af Amer 38 (*)    GFR calc Af Amer 44 (*)    All other components within normal limits  MAGNESIUM  ETHANOL  TSH  T4, FREE  VALPROIC ACID LEVEL  URINALYSIS, ROUTINE W REFLEX MICROSCOPIC  URINE DRUG SCREEN, QUALITATIVE ([facility] ONLY)   ____________________________________________  EKG  ED ECG REPORT I, Loleta Rose, the attending physician, personally viewed and interpreted this ECG.  Date: 06/21/2018 EKG Time: 1:21 AM Rate: 81 Rhythm: normal sinus rhythm QRS Axis: normal Intervals: normal ST/T Wave abnormalities: normal Narrative Interpretation: no evidence of acute ischemia  ____________________________________________  RADIOLOGY I, Loleta Rose, personally viewed and evaluated these images (plain radiographs) as part of my medical decision making, as well as reviewing the written report by the radiologist.  ED MD interpretation: Cortical fracture of the first metatarsal is likely.  Chest x-ray is clear.  No indication of acute abnormality on CT head nor cervical spine.  Official radiology report(s): Dg Chest 2 View  Result Date: 06/21/2018 CLINICAL DATA:  Fall, short of breath EXAM: CHEST - 2 VIEW COMPARISON:  06/11/2014 FINDINGS: No focal airspace disease or pleural effusion. Normal cardiomediastinal silhouette. No pneumothorax. IMPRESSION: No active cardiopulmonary disease. Electronically Signed   By: Jasmine Pang M.D.   On: 06/21/2018 00:56   Ct Head Wo Contrast  Result Date: 06/21/2018 CLINICAL DATA:  Fall EXAM: CT HEAD WITHOUT CONTRAST CT CERVICAL SPINE WITHOUT CONTRAST TECHNIQUE: Multidetector CT imaging of the head and cervical spine was performed following the standard protocol without intravenous contrast. Multiplanar CT image reconstructions of the cervical spine were also generated. COMPARISON:  Head CT 06/11/2014 FINDINGS: CT HEAD  FINDINGS Brain: There is no mass, hemorrhage or extra-axial collection. The size and configuration of the ventricles and extra-axial CSF spaces are normal. There is hypoattenuation of the periventricular white matter, most commonly indicating chronic ischemic microangiopathy. Vascular: Atherosclerotic calcification of the internal carotid arteries at the skull base.  No abnormal hyperdensity of the major intracranial arteries or dural venous sinuses. Skull: The visualized skull base, calvarium and extracranial soft tissues are normal. Sinuses/Orbits: No fluid levels or advanced mucosal thickening of the visualized paranasal sinuses. No mastoid or middle ear effusion. The orbits are normal. CT CERVICAL SPINE FINDINGS Alignment: No static subluxation. Facets are aligned. Occipital condyles are normally positioned. Skull base and vertebrae: No acute fracture. Soft tissues and spinal canal: No prevertebral fluid or swelling. No visible canal hematoma. Disc levels: No advanced spinal canal or neural foraminal stenosis. Upper chest: No pneumothorax, pulmonary nodule or pleural effusion. Other: Normal visualized paraspinal cervical soft tissues. IMPRESSION: 1. No acute intracranial abnormality. 2. No acute fracture or static subluxation of the cervical spine. Electronically Signed   By: Deatra Robinson M.D.   On: 06/21/2018 00:36   Ct Cervical Spine Wo Contrast  Result Date: 06/21/2018 CLINICAL DATA:  Fall EXAM: CT HEAD WITHOUT CONTRAST CT CERVICAL SPINE WITHOUT CONTRAST TECHNIQUE: Multidetector CT imaging of the head and cervical spine was performed following the standard protocol without intravenous contrast. Multiplanar CT image reconstructions of the cervical spine were also generated. COMPARISON:  Head CT 06/11/2014 FINDINGS: CT HEAD FINDINGS Brain: There is no mass, hemorrhage or extra-axial collection. The size and configuration of the ventricles and extra-axial CSF spaces are normal. There is hypoattenuation of the  periventricular white matter, most commonly indicating chronic ischemic microangiopathy. Vascular: Atherosclerotic calcification of the internal carotid arteries at the skull base. No abnormal hyperdensity of the major intracranial arteries or dural venous sinuses. Skull: The visualized skull base, calvarium and extracranial soft tissues are normal. Sinuses/Orbits: No fluid levels or advanced mucosal thickening of the visualized paranasal sinuses. No mastoid or middle ear effusion. The orbits are normal. CT CERVICAL SPINE FINDINGS Alignment: No static subluxation. Facets are aligned. Occipital condyles are normally positioned. Skull base and vertebrae: No acute fracture. Soft tissues and spinal canal: No prevertebral fluid or swelling. No visible canal hematoma. Disc levels: No advanced spinal canal or neural foraminal stenosis. Upper chest: No pneumothorax, pulmonary nodule or pleural effusion. Other: Normal visualized paraspinal cervical soft tissues. IMPRESSION: 1. No acute intracranial abnormality. 2. No acute fracture or static subluxation of the cervical spine. Electronically Signed   By: Deatra Robinson M.D.   On: 06/21/2018 00:36   Dg Foot 2 Views Left  Result Date: 06/21/2018 CLINICAL DATA:  Foot pain after fall EXAM: LEFT FOOT - 2 VIEW COMPARISON:  None. FINDINGS: Possible acute cortical fracture head of the first metatarsal. No subluxation. Suspected artifact/lucency at the base of the first distal phalanx. No radiopaque foreign body IMPRESSION: 1. Suspected acute cortical fracture at the head of the first metatarsal. 2. Probable lucent artifact at the base of the first distal phalanx, correlate clinically for focal tenderness to the region Electronically Signed   By: Jasmine Pang M.D.   On: 06/21/2018 00:55    ____________________________________________   PROCEDURES   Procedure(s) performed (including Critical Care):  Procedures   ____________________________________________   INITIAL  IMPRESSION / MDM / ASSESSMENT AND PLAN / ED COURSE  As part of my medical decision making, I reviewed the following data within the electronic MEDICAL RECORD NUMBER Nursing notes reviewed and incorporated, Labs reviewed , EKG interpreted , Old chart reviewed, Radiograph reviewed  and Notes from prior ED visits         Differential diagnosis includes, but is not limited to, medication side effects, acute infection such as a urinary tract infection or pneumonia or  nonspecific viral illness, acute intracranial hemorrhage, CVA.  I think it is very likely the patient is having side effects to the psychiatric medications that he is on has been taking for a while, but if he has a mild illness or infection this could potentiate the effects of the medications.  Given that he is on Clozaril and valproic acid, I am checking broad lab work as well as obtaining an EKG.  Given the report of his symptoms I am also checking a chest x-ray and a left foot x-ray in addition to obtaining a CT scan of his head and neck; even though I do not see any signs of trauma, he reportedly has been falling multiple times and I think it is important to rule out intracranial pathology.  He is also reporting pain when I touch his neck when he moves his head even though he has moving his head without any hesitation or reservation and given that his chronic mental illness makes it difficult for him to provide a good history, I cannot rule him out with NEXUS.  He is stable and in no distress at this time.  He says that he got Tylenol at the facility and that he has had pain "for years" I will not give any additional medications currently.  Clinical Course as of Jun 21 731  Wed Jun 21, 2018  0229 No acute abnormalities identified on CT head nor CT cervical spine.  Chest x-ray without evidence of acute infection.   [CF]  0301 Lab work is all within normal limits except for an elevated creatinine.  However, I looked back through his medical  record and a creatinine of 1.8 seems to be his baseline.  He has a mild leukocytosis of 12.8 but there is no indication of active infection at this time.   [CF]  0302 The patient does have an acute fracture of the first metatarsal on his left foot.  I will give a hard soled shoe and recommend close podiatry follow-up but there is no indication for operative intervention at this time.  DG Foot 2 Views Left [CF]  [phone] I will provide information about podiatry follow-up and recommend that the patient continue taking his regular medications and follow-up as soon as possible with a psychiatrist because I think that his difficulty with falls and ambulation is likely secondary to medication side effect given that the rest of his work-up is all reassuring.  I gave my usual customary return precautions.   [CF]    Clinical Course User Index [CF] Loleta Rose, MD    ____________________________________________  FINAL CLINICAL IMPRESSION(S) / ED DIAGNOSES  Final diagnoses:  Fall, initial encounter  Closed nondisplaced fracture of first metatarsal bone of left foot, initial encounter     MEDICATIONS GIVEN DURING THIS VISIT:  Medications - No data to display   ED Discharge Orders    None       Note:  This document was prepared using Dragon voice recognition software and may include unintentional dictation errors.   Loleta Rose, MD 06/21/18 [phone]

## 2018-06-21 ENCOUNTER — Other Ambulatory Visit: Payer: Self-pay

## 2018-06-21 ENCOUNTER — Emergency Department: Payer: Medicare Other

## 2018-06-21 DIAGNOSIS — S92315A Nondisplaced fracture of first metatarsal bone, left foot, initial encounter for closed fracture: Secondary | ICD-10-CM | POA: Diagnosis not present

## 2018-06-21 LAB — CBC WITH DIFFERENTIAL/PLATELET
Abs Immature Granulocytes: 0.28 10*3/uL — ABNORMAL HIGH (ref 0.00–0.07)
Basophils Absolute: 0.1 10*3/uL (ref 0.0–0.1)
Basophils Relative: 1 %
Eosinophils Absolute: 0 10*3/uL (ref 0.0–0.5)
Eosinophils Relative: 0 %
HCT: 40.3 % (ref 39.0–52.0)
Hemoglobin: 13.5 g/dL (ref 13.0–17.0)
Immature Granulocytes: 2 %
Lymphocytes Relative: 18 %
Lymphs Abs: 2.3 10*3/uL (ref 0.7–4.0)
MCH: 32.1 pg (ref 26.0–34.0)
MCHC: 33.5 g/dL (ref 30.0–36.0)
MCV: 96 fL (ref 80.0–100.0)
Monocytes Absolute: 1.2 10*3/uL — ABNORMAL HIGH (ref 0.1–1.0)
Monocytes Relative: 10 %
Neutro Abs: 9 10*3/uL — ABNORMAL HIGH (ref 1.7–7.7)
Neutrophils Relative %: 69 %
Platelets: 201 10*3/uL (ref 150–400)
RBC: 4.2 MIL/uL — ABNORMAL LOW (ref 4.22–5.81)
RDW: 15.1 % (ref 11.5–15.5)
WBC: 12.8 10*3/uL — ABNORMAL HIGH (ref 4.0–10.5)
nRBC: 0 % (ref 0.0–0.2)

## 2018-06-21 LAB — COMPREHENSIVE METABOLIC PANEL
ALT: 12 U/L (ref 0–44)
AST: 20 U/L (ref 15–41)
Albumin: 3.2 g/dL — ABNORMAL LOW (ref 3.5–5.0)
Alkaline Phosphatase: 73 U/L (ref 38–126)
Anion gap: 9 (ref 5–15)
BUN: 25 mg/dL — ABNORMAL HIGH (ref 8–23)
CO2: 21 mmol/L — ABNORMAL LOW (ref 22–32)
Calcium: 8.8 mg/dL — ABNORMAL LOW (ref 8.9–10.3)
Chloride: 107 mmol/L (ref 98–111)
Creatinine, Ser: 1.85 mg/dL — ABNORMAL HIGH (ref 0.61–1.24)
GFR calc Af Amer: 44 mL/min — ABNORMAL LOW (ref >60)
GFR calc non Af Amer: 38 mL/min — ABNORMAL LOW (ref >60)
Glucose, Bld: 90 mg/dL (ref 70–99)
Potassium: 4.6 mmol/L (ref 3.5–5.1)
Sodium: 137 mmol/L (ref 135–145)
Total Bilirubin: 0.4 mg/dL (ref 0.3–1.2)
Total Protein: 6.6 g/dL (ref 6.5–8.1)

## 2018-06-21 LAB — MAGNESIUM: Magnesium: 2.2 mg/dL (ref 1.7–2.4)

## 2018-06-21 LAB — VALPROIC ACID LEVEL: Valproic Acid Lvl: 93 ug/mL (ref 50.0–100.0)

## 2018-06-21 LAB — TSH: TSH: 2.305 u[IU]/mL (ref 0.350–4.500)

## 2018-06-21 LAB — T4, FREE: Free T4: 0.95 ng/dL (ref 0.82–1.77)

## 2018-06-21 LAB — ETHANOL: Alcohol, Ethyl (B): <10 mg/dL (ref <10)

## 2018-06-21 NOTE — ED Notes (Signed)
Beth NT to complete EKG now.

## 2018-06-21 NOTE — ED Notes (Signed)
Group home called. No answer. Message left for Austin Marsh

## 2018-06-21 NOTE — ED Notes (Addendum)
Not enough blood return for labs from EMS IV; 2nd IV placed. Grn/Redx2/Blue/Lav tubes sent to lab.

## 2018-06-21 NOTE — Discharge Instructions (Signed)
As we discussed, your work-up was reassuring today.  Your lab work, chest x-ray, head CT, and neck CT are all within normal limits.  It does look like you may have a small incomplete fracture to the big toe of your left foot which is why you are having some pain.  We provided a special kind of shoe and you can walk on it as much as you can tolerate but you will need to follow-up with a foot specialist such as Dr. Orland Jarred or 1 of his colleagues (please refer to the included information).  You can use over-the-counter pain medicine such as Tylenol thousand milligrams every 6 hours as needed.  Please follow-up with your regular doctors including your psychiatrist to discuss your recent unsteadiness of gait.  This may be a side effect of your psychiatric medications.  The rest of your medical work-up today was reassuring without any evidence of infection and there is no indication to keep you in the hospital.    Return to the emergency department if you develop new or worsening symptoms that concern you.

## 2018-06-26 ENCOUNTER — Encounter: Payer: Self-pay | Admitting: Urology

## 2018-06-26 ENCOUNTER — Ambulatory Visit (INDEPENDENT_AMBULATORY_CARE_PROVIDER_SITE_OTHER): Payer: Medicare Other | Admitting: Urology

## 2018-06-26 VITALS — BP 96/69 | HR 83 | Ht 69.0 in

## 2018-06-26 DIAGNOSIS — N5082 Scrotal pain: Secondary | ICD-10-CM | POA: Diagnosis not present

## 2018-06-26 DIAGNOSIS — N430 Encysted hydrocele: Secondary | ICD-10-CM | POA: Diagnosis not present

## 2018-06-26 NOTE — Progress Notes (Signed)
06/26/2018 2:17 PM   Kenn Rekowski 07/04/1955 [id number]  Referring provider: [facility] [address] [place], [place] [address]  Chief Complaint  Patient presents with  . Groin Swelling    New Patient    HPI: 63 year old male presented to the [facility] ED on 05/03/2018 with a one-week history of pain and swelling in the left hemiscrotum.  His pain was rated moderate without identifiable precipitating, aggravating or alleviating factors.  A scrotal sonogram was performed which showed a large left hydrocele measuring up to 7 cm.  He was treated with empiric antibiotics due to pain with urology follow-up recommended.  He continues to have pain which is rated mild to moderate.  Pain is worse with movement and pressure.  Denies fever, chills.  He does have urinary frequency but denies dysuria.   PMH: Past Medical History:  Diagnosis Date  . COPD (chronic obstructive pulmonary disease) (HCC)   . Schizoaffective disorder, bipolar type (HCC)   . Sleep apnea   . Tardive dyskinesia   . Thyroid disease     Surgical History: Past Surgical History:  Procedure Laterality Date  . APPENDECTOMY      Home Medications:  Allergies as of 06/26/2018      Reactions   Haldol [haloperidol Lactate] Other (See Comments)   Tardive diskonesia   Other    Navy Beans cause patient to vomit   Thorazine [chlorpromazine] Other (See Comments)   "messes me all up"   Trazodone And Nefazodone Other (See Comments)   "makes me feel like I'm dying."      Medication List       Accurate as of June 26, 2018  2:17 PM. Always use your most recent med list.        acetaminophen 325 MG tablet Commonly known as:  TYLENOL Take 650 mg by mouth every 4 (four) hours as needed.   alum & mag hydroxide-simeth 200-200-20 MG/5ML suspension Commonly known as:  MAALOX/MYLANTA Take 30 mLs by mouth daily as needed for indigestion or heartburn.   benztropine 2 MG tablet Commonly known as:   COGENTIN Take 2 mg by mouth 2 (two) times daily.   bismuth subsalicylate 262 MG/15ML suspension Commonly known as:  PEPTO BISMOL Take 15 mLs by mouth every 4 (four) hours as needed.   busPIRone 15 MG tablet Commonly known as:  BUSPAR Take 15 mg by mouth 2 (two) times daily.   cloZAPine 100 MG tablet Commonly known as:  CLOZARIL Take 100-200 mg by mouth daily. Take 100 mg in the morning and 200 mg at bedtime for scizophrenia   divalproex 500 MG 24 hr tablet Commonly known as:  DEPAKOTE ER Take 1,000 mg by mouth at bedtime.   guaiFENesin 100 MG/5ML liquid Commonly known as:  ROBITUSSIN Take 200 mg by mouth every 6 (six) hours as needed for cough.   levothyroxine 75 MCG tablet Commonly known as:  SYNTHROID, LEVOTHROID Take 75 mcg by mouth daily before breakfast.   loperamide 2 MG capsule Commonly known as:  IMODIUM Take 2 mg by mouth as needed for diarrhea or loose stools.   magnesium hydroxide 400 MG/5ML suspension Commonly known as:  MILK OF MAGNESIA Take 30 mLs by mouth daily as needed for mild constipation.   neomycin-bacitracin-polymyxin ointment Commonly known as:  NEOSPORIN Apply 1 application topically as needed for wound care.   omeprazole 20 MG capsule Commonly known as:  PRILOSEC Take 20 mg by mouth daily.   polyethylene glycol packet  Commonly known as:  MIRALAX / GLYCOLAX Take 17 g by mouth daily.   sulfamethoxazole-trimethoprim 800-160 MG tablet Commonly known as:  BACTRIM DS,SEPTRA DS Take 1 tablet by mouth 2 (two) times daily.   tamsulosin 0.4 MG Caps capsule Commonly known as:  FLOMAX Take 0.4 mg by mouth 2 (two) times daily.   traMADol 50 MG tablet Commonly known as:  Ultram Take 1 tablet (50 mg total) by mouth every 6 (six) hours as needed.   Vitamin D (Cholecalciferol) 25 MCG (1000 UT) Tabs Take 25 mcg by mouth daily.       Allergies:  Allergies  Allergen Reactions  . Haldol [Haloperidol Lactate] Other (See Comments)    Tardive  diskonesia  . Other     Navy Beans cause patient to vomit  . Thorazine [Chlorpromazine] Other (See Comments)    "messes me all up"  . Trazodone And Nefazodone Other (See Comments)    "makes me feel like I'm dying."    Family History: No family history on file.  Social History:  reports that he has been smoking cigarettes. He has never used smokeless tobacco. He reports previous alcohol use. He reports previous drug use.  ROS: UROLOGY Frequent Urination?: Yes Hard to postpone urination?: No Burning/pain with urination?: No Get up at night to urinate?: No Leakage of urine?: No Urine stream starts and stops?: No Trouble starting stream?: No Do you have to strain to urinate?: No Blood in urine?: No Urinary tract infection?: No Sexually transmitted disease?: No Injury to kidneys or bladder?: No Painful intercourse?: No Weak stream?: No Erection problems?: No Penile pain?: No  Gastrointestinal Nausea?: No Vomiting?: No Indigestion/heartburn?: No Diarrhea?: No Constipation?: No  Constitutional Fever: No Night sweats?: No Weight loss?: No Fatigue?: No  Skin Skin rash/lesions?: No Itching?: No  Eyes Blurred vision?: No Double vision?: No  Ears/Nose/Throat Sore throat?: No Sinus problems?: No  Hematologic/Lymphatic Swollen glands?: No Easy bruising?: No  Cardiovascular Leg swelling?: No Chest pain?: No  Respiratory Cough?: No Shortness of breath?: No  Endocrine Excessive thirst?: No  Musculoskeletal Back pain?: Yes Joint pain?: No  Neurological Headaches?: No Dizziness?: Yes  Psychologic Depression?: No Anxiety?: Yes  Physical Exam: BP 96/69   Pulse 83   Ht 5\' 9"  (1.753 m)   BMI 28.81 kg/m   Constitutional:  Alert and oriented, No acute distress. HEENT: [place] AT, moist mucus membranes.  Trachea midline, no masses. Cardiovascular: No clubbing, cyanosis, or edema. Respiratory: Normal respiratory effort, no increased work of  breathing. GI: Abdomen is soft, nontender, nondistended, no abdominal masses GU: No CVA tenderness.  Left hemiscrotal mass measuring 6-7 cm which is very firm with mild to moderate tenderness.  No scrotal erythema or skin thickening. Lymph: No cervical or inguinal lymphadenopathy. Skin: No rashes, bruises or suspicious lesions. Neurologic: Grossly intact, no focal deficits, moving all 4 extremities.   Pertinent Imaging: Images were personally reviewed  Results for orders placed during the hospital encounter of 05/03/18  US SCROTUM DOPPLER   Narrative CLINICAL DATA:  Left testicular pain and swelling for the past week.  EXAM: SCROTAL ULTRASOUND  DOPPLER ULTRASOUND OF THE TESTICLES  TECHNIQUE: Complete ultrasound examination of the testicles, epididymis, and other scrotal structures was performed. Color and spectral Doppler ultrasound were also utilized to evaluate blood flow to the testicles.  COMPARISON:  None.  FINDINGS: Right testicle  Measurements: 3.7 x 2.1 x 1.9 cm. No mass or microlithiasis visualized.  Left testicle  Measurements: 4.7 x 2.5 x 1.6  cm. No mass or microlithiasis visualized.  Right epididymis: Normal in size and appearance. Small epididymal cysts measuring up to 5 mm.  Left epididymis:  Normal in size and appearance.  Hydrocele: Minimally complicated large left hydrocele with a few thin internal septations. Small right hydrocele.  Varicocele:  None visualized.  Pulsed Doppler interrogation of both testes demonstrates normal low resistance arterial and venous waveforms bilaterally.  IMPRESSION: 1. Minimally complicated large left hydrocele. 2. Small right hydrocele. 3. Normal sonographic appearance of the testicles.   Electronically Signed   By: Obie Dredge M.D.   On: 05/03/2018 11:58     Assessment & Plan:   63 year old male with a right hydrocele and right hemiscrotal pain.  The mass is very firm and pain may be secondary to  pressure.  We discussed options of hydrocelectomy and aspiration.  The pros and cons of each treatment were discussed.  Due to the firmness of the mass I would like to get a follow-up ultrasound and will initially schedule office ultrasound and if indeed a hydrocele aspiration.  The procedure was discussed in detail including the possibility of fluid reaccumulation.  Low risk of bleeding and infection were discussed.  He indicated all questions were answered and desires to proceed.   Riki Altes, MD  [facility] [address] [place], [place] [address] [phone]

## 2018-06-30 ENCOUNTER — Ambulatory Visit (INDEPENDENT_AMBULATORY_CARE_PROVIDER_SITE_OTHER): Payer: Medicare Other | Admitting: Urology

## 2018-06-30 ENCOUNTER — Other Ambulatory Visit: Payer: Self-pay

## 2018-06-30 VITALS — BP 107/80 | HR 84

## 2018-06-30 DIAGNOSIS — N430 Encysted hydrocele: Secondary | ICD-10-CM

## 2018-06-30 NOTE — Patient Instructions (Signed)
Hydrocele, Adult  A hydrocele is a collection of fluid in the loose pouch of skin that holds the testicles (scrotum). This may happen because:  The amount of fluid produced in the scrotum is not absorbed by the rest of the body.  Fluid from the abdomen fills the scrotum. Normally, the testicles develop in the abdomen then move (drop) into to the scrotum before birth. The tube that the testicles travel through usually closes after the testicles drop. If the tube does not close, fluid from the abdomen can fill the scrotum. This is less common in adults.  What are the causes?  The cause of a hydrocele in adults is usually not known. However, it may be caused by:  An injury to the scrotum.  An infection (epididymitis).  Decreased blood flow to the scrotum.  Twisting of a testicle (testicular torsion).  A birth defect.  A tumor or cancer of the testicle.  What are the signs or symptoms?  A hydrocele feels like a water-filled balloon. It may also feel heavy. Other symptoms include:  Swelling of the scrotum. The swelling may decrease when you lie down. You may also notice more swelling at night than in the morning.  Swelling of the groin.  Mild discomfort in the scrotum.  Pain. This can develop if the hydrocele was caused by infection or twisting. The larger the hydrocele, the more likely you are to have pain.  How is this diagnosed?  This condition may be diagnosed based on:  Physical exam.  Medical history.  You may also have other tests, including:  Imaging tests, such as ultrasound.  Blood or urine tests.  How is this treated?  Most hydroceles go away on their own. If you have no discomfort or pain, your health care provider may suggest close monitoring of your condition (called watch and wait or watchful waiting) until the condition goes away or symptoms develop. If treatment is needed, it may include:  Treating an underlying condition. This may include using an antibiotic medicine to treat an infection.  Surgery to  stop fluid from collecting in the scrotum.  Surgery to drain the fluid. Options include:  Needle aspiration. A needle is used to drain fluid. However, the fluid buildup will come back quickly.  Hydrocelectomy. For this procedure, an incision is made in the scrotum to remove the fluid sac.  Follow these instructions at home:  Watch the hydrocele for any changes.  Take over-the-counter and prescription medicines only as told by your health care provider.  If you were prescribed an antibiotic medicine, use it as told by your health care provider. Do not stop taking the antibiotic even if you start to feel better.  Keep all follow-up visits as told by your health care provider. This is important.  Contact a health care provider if:  You notice any changes in the hydrocele.  The swelling in your scrotum or groin gets worse.  The hydrocele becomes red, firm, painful, or tender to the touch.  You have a fever.  Get help right away if you:  Develop a lot of pain, or your pain becomes worse.  Summary  A hydrocele is a collection of fluid in the loose pouch of skin that holds the testicles (scrotum).  Hydroceles can cause swelling, discomfort, and sometimes pain.  In adults, the cause of a hydrocele usually is not known. However, it is sometimes caused by an infection or a rotation and twisting of the scrotum.  Treatment is usually not   needed. Hydroceles often go away on their own. If a hydrocele causes pain, treatment may be given to ease the pain.  This information is not intended to replace advice given to you by your health care provider. Make sure you discuss any questions you have with your health care provider.  Document Released: 09/23/2009 Document Revised: 04/16/2017 Document Reviewed: 04/16/2017  Elsevier Interactive Patient Education  2019 [employer]

## 2018-07-02 NOTE — Progress Notes (Signed)
Procedure note:  Diagnosis: Left hydrocele Procedure: Hydrocele aspiration Physician: Irineo Axon, MD Anesthesia: 1% plain Xylocaine Indications: Refer to previous office note 06/26/2018 Description: The patient was placed in supine position.  A scrotal sonogram was performed and the left scrotal mass was a hydrocele but was multiloculated.  His external genitalia were then prepped and draped in the usual sterile fashion.  The anterior scrotal skin was anesthetized with 1 cc of 1% plain Xylocaine.  A 16-gauge Angiocath was inserted over the anesthetized area and approximately 20 cc of straw-colored fluid was aspirated.  Aspiration was performed and a second area with approximately the same volume aspirated.  Complications: None  Plan: The site was dressed with gauze.  Follow-up 1 month.  Due to the multiloculated nature if still symptomatic we will discuss hydrocelectomy on follow-up.  Irineo Axon, MD

## 2018-08-09 ENCOUNTER — Ambulatory Visit: Payer: Medicare Other | Admitting: Urology

## 2018-08-22 ENCOUNTER — Ambulatory Visit: Payer: Medicare Other | Admitting: Urology

## 2018-08-29 ENCOUNTER — Other Ambulatory Visit: Payer: Self-pay

## 2018-08-29 ENCOUNTER — Encounter: Payer: Self-pay | Admitting: Urology

## 2018-08-29 ENCOUNTER — Ambulatory Visit (INDEPENDENT_AMBULATORY_CARE_PROVIDER_SITE_OTHER): Payer: Medicare Other | Admitting: Urology

## 2018-08-29 VITALS — BP 90/68 | HR 91 | Ht 69.0 in

## 2018-08-29 DIAGNOSIS — N430 Encysted hydrocele: Secondary | ICD-10-CM

## 2018-08-29 NOTE — Progress Notes (Signed)
08/29/2018 12:36 PM   Austin Marsh 22-May-1955 [id number]  Referring provider: [facility] [address] [place], [place] [address]  Chief Complaint  Patient presents with  . Hydrocele    Follow Up    HPI: 63 year old male presents for follow-up of hydrocele aspiration.  He was previously seen for left hemiscrotal pain with a tense hydrocele.  He initially elected hydrocele aspiration which was performed on 06/30/2018.  The hydrocele was noted to be multiloculated and only 40 cc of fluid was able to be aspirated.  The patient does state however his pain has resolved.  He notices a "lump" in his left hemiscrotum which is not tender.   PMH: Past Medical History:  Diagnosis Date  . COPD (chronic obstructive pulmonary disease) (HCC)   . Schizoaffective disorder, bipolar type (HCC)   . Sleep apnea   . Tardive dyskinesia   . Thyroid disease     Surgical History: Past Surgical History:  Procedure Laterality Date  . APPENDECTOMY      Home Medications:  Allergies as of 08/29/2018      Reactions   Haldol [haloperidol Lactate] Other (See Comments)   Tardive diskonesia   Other    Navy Beans cause patient to vomit   Thorazine [chlorpromazine] Other (See Comments)   "messes me all up"   Trazodone And Nefazodone Other (See Comments)   "makes me feel like I'm dying."      Medication List       Accurate as of Aug 29, 2018 12:36 PM. If you have any questions, ask your nurse or doctor.        acetaminophen 325 MG tablet Commonly known as:  TYLENOL Take 650 mg by mouth every 4 (four) hours as needed.   alum & mag hydroxide-simeth 200-200-20 MG/5ML suspension Commonly known as:  MAALOX/MYLANTA Take 30 mLs by mouth daily as needed for indigestion or heartburn.   benztropine 2 MG tablet Commonly known as:  COGENTIN Take 2 mg by mouth 2 (two) times daily.   bismuth subsalicylate 262 MG/15ML suspension Commonly known as:  PEPTO BISMOL Take 15  mLs by mouth every 4 (four) hours as needed.   busPIRone 15 MG tablet Commonly known as:  BUSPAR Take 15 mg by mouth 2 (two) times daily.   cloZAPine 100 MG tablet Commonly known as:  CLOZARIL Take 100-200 mg by mouth daily. Take 100 mg in the morning and 200 mg at bedtime for scizophrenia   divalproex 500 MG 24 hr tablet Commonly known as:  DEPAKOTE ER Take 1,000 mg by mouth at bedtime.   guaiFENesin 100 MG/5ML liquid Commonly known as:  ROBITUSSIN Take 200 mg by mouth every 6 (six) hours as needed for cough.   levothyroxine 75 MCG tablet Commonly known as:  SYNTHROID Take 75 mcg by mouth daily before breakfast.   loperamide 2 MG capsule Commonly known as:  IMODIUM Take 2 mg by mouth as needed for diarrhea or loose stools.   magnesium hydroxide 400 MG/5ML suspension Commonly known as:  MILK OF MAGNESIA Take 30 mLs by mouth daily as needed for mild constipation.   neomycin-bacitracin-polymyxin ointment Commonly known as:  NEOSPORIN Apply 1 application topically as needed for wound care.   omeprazole 40 MG capsule Commonly known as:  PRILOSEC Take 40 mg by mouth daily.   polyethylene glycol 17 g packet Commonly known as:  MIRALAX / GLYCOLAX Take 17 g by mouth daily.   sulfamethoxazole-trimethoprim 800-160 MG tablet Commonly known as:  BACTRIM DS Take 1 tablet by mouth 2 (two) times daily.   tamsulosin 0.4 MG Caps capsule Commonly known as:  FLOMAX Take 0.4 mg by mouth 2 (two) times daily.   traMADol 50 MG tablet Commonly known as:  Ultram Take 1 tablet (50 mg total) by mouth every 6 (six) hours as needed.   Vitamin D (Cholecalciferol) 25 MCG (1000 UT) Tabs Take 25 mcg by mouth daily.       Allergies:  Allergies  Allergen Reactions  . Haldol [Haloperidol Lactate] Other (See Comments)    Tardive diskonesia  . Other     Navy Beans cause patient to vomit  . Thorazine [Chlorpromazine] Other (See Comments)    "messes me all up"  . Trazodone And Nefazodone  Other (See Comments)    "makes me feel like I'm dying."    Family History: History reviewed. No pertinent family history.  Social History:  reports that he has been smoking cigarettes. He has never used smokeless tobacco. He reports previous alcohol use. He reports previous drug use.  ROS: UROLOGY Frequent Urination?: No Hard to postpone urination?: No Burning/pain with urination?: No Get up at night to urinate?: No Leakage of urine?: No Urine stream starts and stops?: No Trouble starting stream?: No Do you have to strain to urinate?: No Blood in urine?: No Urinary tract infection?: No Sexually transmitted disease?: No Injury to kidneys or bladder?: No Painful intercourse?: No Weak stream?: No Erection problems?: No Penile pain?: No  Gastrointestinal Nausea?: No Vomiting?: No Indigestion/heartburn?: No Diarrhea?: No Constipation?: No  Constitutional Fever: No Night sweats?: No Weight loss?: No Fatigue?: No  Skin Skin rash/lesions?: No Itching?: No  Eyes Blurred vision?: No Double vision?: No  Ears/Nose/Throat Sore throat?: No Sinus problems?: No  Hematologic/Lymphatic Swollen glands?: No Easy bruising?: No  Cardiovascular Leg swelling?: No Chest pain?: No  Respiratory Cough?: No Shortness of breath?: No  Endocrine Excessive thirst?: No  Musculoskeletal Back pain?: No Joint pain?: No  Neurological Headaches?: No Dizziness?: No  Psychologic Depression?: No Anxiety?: No  Physical Exam: BP 90/68 (BP Location: Left Arm, Patient Position: Sitting, Cuff Size: Normal)   Pulse 91   Ht 5\' 9"  (1.753 m)   BMI 28.81 kg/m   Constitutional:  Alert and oriented, No acute distress. HEENT: [place] AT, moist mucus membranes.  Trachea midline, no masses. Cardiovascular: No clubbing, cyanosis, or edema. Respiratory: Normal respiratory effort, no increased work of breathing. GI: Abdomen is soft, nontender, nondistended, no abdominal masses GU: No CVA  tenderness.  Left hemiscrotum significantly reduced in size from pre-aspiration.  No tenderness present.  Slight induration superior aspect of scrotum anteriorly which may be a small hematoma.  This area is nontender.   Assessment & Plan:   63 year old male with resolution of his left hemiscrotal pain after hydrocele aspiration.  I recommended observation with a follow-up exam in 3 months.  Return in about 3 months (around 11/29/2018) for Recheck with Carollee HerterShannon.  Riki AltesScott C Stoioff, MD  [facility] [address], [address] [place], [place] [address] [phone]

## 2018-11-28 NOTE — Progress Notes (Signed)
11/29/2018 10:07 AM   Austin Marsh Oct 26, 1955 [id number]  Referring provider: [facility] [address] [place],  [place] [id number]  Chief Complaint  Patient presents with  . Hydrocele    HPI: Austin Marsh is a 63 year old male with a left hydrocele who presents today for follow up exam.    Presented to the [facility] ED on 05/03/2018 with a one-week history of pain and swelling in the left hemiscrotum.  His pain was rated moderate without identifiable precipitating, aggravating or alleviating factors.  A scrotal sonogram was performed which showed a large left hydrocele measuring up to 7 cm.    He underwent hydrocele aspiration with Dr. Lonna CobbStoioff on 06/30/2018.  40 cc of aspirate was collected.    He states today he is having no issues.  He denies any scrotal pain or swelling.  Patient denies any gross hematuria, dysuria or suprapubic/flank pain.  Patient denies any fevers, chills, nausea or vomiting.   He is taking Flomax as prescribed by his PCP.  He also states his PCP checks his PSA's annually as well.    PMH: Past Medical History:  Diagnosis Date  . COPD (chronic obstructive pulmonary disease) (HCC)   . Schizoaffective disorder, bipolar type (HCC)   . Sleep apnea   . Tardive dyskinesia   . Thyroid disease     Surgical History: Past Surgical History:  Procedure Laterality Date  . APPENDECTOMY      Home Medications:  Allergies as of 11/29/2018      Reactions   Haldol [haloperidol Lactate] Other (See Comments)   Tardive diskonesia   Other    Navy Beans cause patient to vomit   Thorazine [chlorpromazine] Other (See Comments)   "messes me all up"   Trazodone And Nefazodone Other (See Comments)   "makes me feel like I'm dying."      Medication List       Accurate as of November 29, 2018 10:07 AM. If you have any questions, ask your nurse or doctor.        acetaminophen 325 MG tablet Commonly known as: TYLENOL Take 650 mg by mouth every  4 (four) hours as needed.   alum & mag hydroxide-simeth 200-200-20 MG/5ML suspension Commonly known as: MAALOX/MYLANTA Take 30 mLs by mouth daily as needed for indigestion or heartburn.   benztropine 2 MG tablet Commonly known as: COGENTIN Take 2 mg by mouth 2 (two) times daily.   bismuth subsalicylate 262 MG/15ML suspension Commonly known as: PEPTO BISMOL Take 15 mLs by mouth every 4 (four) hours as needed.   busPIRone 15 MG tablet Commonly known as: BUSPAR Take 15 mg by mouth 2 (two) times daily.   cloZAPine 100 MG tablet Commonly known as: CLOZARIL Take 100-200 mg by mouth daily. Take 100 mg in the morning and 200 mg at bedtime for scizophrenia   divalproex 500 MG 24 hr tablet Commonly known as: DEPAKOTE ER Take 1,000 mg by mouth at bedtime.   guaiFENesin 100 MG/5ML liquid Commonly known as: ROBITUSSIN Take 200 mg by mouth every 6 (six) hours as needed for cough.   levothyroxine 75 MCG tablet Commonly known as: SYNTHROID Take 75 mcg by mouth daily before breakfast.   loperamide 2 MG capsule Commonly known as: IMODIUM Take 2 mg by mouth as needed for diarrhea or loose stools.   magnesium hydroxide 400 MG/5ML suspension Commonly known as: MILK OF MAGNESIA Take 30 mLs by mouth daily as needed for mild constipation.  neomycin-bacitracin-polymyxin ointment Commonly known as: NEOSPORIN Apply 1 application topically as needed for wound care.   omeprazole 40 MG capsule Commonly known as: PRILOSEC Take 40 mg by mouth daily.   polyethylene glycol 17 g packet Commonly known as: MIRALAX / GLYCOLAX Take 17 g by mouth daily.   sulfamethoxazole-trimethoprim 800-160 MG tablet Commonly known as: BACTRIM DS Take 1 tablet by mouth 2 (two) times daily.   tamsulosin 0.4 MG Caps capsule Commonly known as: FLOMAX Take 0.4 mg by mouth 2 (two) times daily.   traMADol 50 MG tablet Commonly known as: Ultram Take 1 tablet (50 mg total) by mouth every 6 (six) hours as needed.    Vitamin D (Cholecalciferol) 25 MCG (1000 UT) Tabs Take 25 mcg by mouth daily.       Allergies:  Allergies  Allergen Reactions  . Haldol [Haloperidol Lactate] Other (See Comments)    Tardive diskonesia  . Other     Navy Beans cause patient to vomit  . Thorazine [Chlorpromazine] Other (See Comments)    "messes me all up"  . Trazodone And Nefazodone Other (See Comments)    "makes me feel like I'm dying."    Family History: No family history on file.  Social History:  reports that he has been smoking cigarettes. He has never used smokeless tobacco. He reports previous alcohol use. He reports previous drug use.  ROS: UROLOGY Frequent Urination?: No Hard to postpone urination?: No Burning/pain with urination?: No Get up at night to urinate?: No Leakage of urine?: No Urine stream starts and stops?: No Trouble starting stream?: No Do you have to strain to urinate?: No Blood in urine?: No Urinary tract infection?: No Sexually transmitted disease?: No Injury to kidneys or bladder?: No Painful intercourse?: No Weak stream?: No Erection problems?: No Penile pain?: No  Gastrointestinal Nausea?: No Vomiting?: No Indigestion/heartburn?: No Diarrhea?: No Constipation?: No  Constitutional Fever: No Night sweats?: No Weight loss?: No Fatigue?: No  Skin Skin rash/lesions?: No Itching?: No  Eyes Blurred vision?: No Double vision?: No  Ears/Nose/Throat Sore throat?: No Sinus problems?: No  Hematologic/Lymphatic Swollen glands?: No Easy bruising?: No  Cardiovascular Leg swelling?: No Chest pain?: No  Respiratory Cough?: No Shortness of breath?: No  Endocrine Excessive thirst?: No  Musculoskeletal Back pain?: No Joint pain?: No  Neurological Headaches?: No Dizziness?: No  Psychologic Depression?: No Anxiety?: No  Physical Exam: BP 99/74 (BP Location: Left Arm, Patient Position: Sitting, Cuff Size: Normal)   Pulse 86   Ht 5\' 9"  (1.753 m)    BMI 28.81 kg/m   Constitutional:  Well nourished. Alert and oriented, No acute distress. HEENT: [place] AT, moist mucus membranes.  Trachea midline, no masses. Cardiovascular: No clubbing, cyanosis, or edema. Respiratory: Normal respiratory effort, no increased work of breathing. GI: Abdomen is soft, non tender, non distended, no abdominal masses. Liver and spleen not palpable.  No hernias appreciated.  Stool sample for occult testing is not indicated.   GU: No CVA tenderness.  No bladder fullness or masses.  Patient with normal phallus. Urethral meatus is patent.  No penile discharge. No penile lesions or rashes. Scrotum without lesions, cysts, rashes and/or edema.  Testicles are located scrotally bilaterally. No masses are appreciated in the testicles. Left and right epididymis are normal.  Small right hydrocele.  Left hemiscrotal mass noted.  Rectal: Patient with  normal sphincter tone. Anus and perineum without scarring or rashes. No rectal masses are appreciated. Prostate is approximately 50 grams, could only palpate the  apex and midportion of the gland, no nodules are appreciated. Seminal vesicles could not be palpated. Skin: No rashes, bruises or suspicious lesions. Lymph: No inguinal adenopathy. Neurologic: Grossly intact, no focal deficits, moving all 4 extremities. Psychiatric: Normal mood and affect.   Pertinent Imaging: CLINICAL DATA:  Insisted hydrocele, LEFT testicular lump  EXAM: SCROTAL ULTRASOUND  DOPPLER ULTRASOUND OF THE TESTICLES  TECHNIQUE: Complete ultrasound examination of the testicles, epididymis, and other scrotal structures was performed. Color and spectral Doppler ultrasound were also utilized to evaluate blood flow to the testicles.  COMPARISON:  05/03/2018  FINDINGS: Right testicle  Measurements: 3.5 x 2.4 x 2.2 cm. Normal echogenicity without mass or calcification. Internal blood flow present on color Doppler imaging.  Left testicle   Measurements: 8.4 x 2.1 x 2.8 cm. Normal echogenicity without mass or calcification. Internal blood flow present on color Doppler imaging, symmetric with RIGHT  Right epididymis:  Normal in size and appearance.  Left epididymis:  Normal in size and appearance.  Hydrocele: RIGHT hydrocele measuring 4.8 x 1.9 x 3.1 cm, similar appearance to prior exam  Varicocele:  None visualized.  Pulsed Doppler interrogation of both testes demonstrates normal low resistance arterial and venous waveforms bilaterally.  No hernias visualized.  IMPRESSION: RIGHT hydrocele.  No LEFT testicular/scrotal mass identified.   Electronically Signed   By: Lavonia Dana M.D.   On: 12/11/2018 16:19 I have independently reviewed the films and note the right hydrocele.     Assessment & Plan:    1. Hydrocele  Right hydrocele seen on scrotal ultrasound  No testicular masses  RTC in one year.  2. BPH Managed by PCP Will request previous PSA results   Zara Council, [facility]  [place] [address] [place], [place] [address] [phone]

## 2018-11-29 ENCOUNTER — Other Ambulatory Visit: Payer: Self-pay

## 2018-11-29 ENCOUNTER — Encounter: Payer: Self-pay | Admitting: Urology

## 2018-11-29 ENCOUNTER — Ambulatory Visit (INDEPENDENT_AMBULATORY_CARE_PROVIDER_SITE_OTHER): Payer: Medicare Other | Admitting: Urology

## 2018-11-29 VITALS — BP 99/74 | HR 86 | Ht 69.0 in

## 2018-11-29 DIAGNOSIS — N5089 Other specified disorders of the male genital organs: Secondary | ICD-10-CM | POA: Diagnosis not present

## 2018-11-29 DIAGNOSIS — N430 Encysted hydrocele: Secondary | ICD-10-CM

## 2018-11-29 DIAGNOSIS — Z125 Encounter for screening for malignant neoplasm of prostate: Secondary | ICD-10-CM | POA: Diagnosis not present

## 2018-12-08 ENCOUNTER — Telehealth: Payer: Self-pay | Admitting: Urology

## 2018-12-08 NOTE — Telephone Encounter (Signed)
Patient see's Austin Marsh  [address] [place],  [address] [phone] Fax [phone] NPI [id number] [facility]

## 2018-12-11 ENCOUNTER — Ambulatory Visit
Admission: RE | Admit: 2018-12-11 | Discharge: 2018-12-11 | Disposition: A | Discharge status: Home / Self Care | Payer: Medicare Other | Source: Ambulatory Visit | Attending: Urology | Admitting: Urology

## 2018-12-11 ENCOUNTER — Other Ambulatory Visit: Payer: Self-pay

## 2018-12-11 DIAGNOSIS — N5089 Other specified disorders of the male genital organs: Secondary | ICD-10-CM | POA: Diagnosis present

## 2018-12-11 DIAGNOSIS — N430 Encysted hydrocele: Secondary | ICD-10-CM | POA: Insufficient documentation

## 2018-12-12 ENCOUNTER — Telehealth: Payer: Self-pay | Admitting: Urology

## 2018-12-12 ENCOUNTER — Telehealth: Payer: Self-pay

## 2018-12-12 NOTE — Telephone Encounter (Signed)
-----   Message from Nori Riis, PA-C sent at 12/12/2018  7:41 AM EDT ----- Please let Mr. Hoban know that his scrotal ultrasound was normal.

## 2018-12-12 NOTE — Telephone Encounter (Signed)
Please schedule Mr. Austin Marsh for a yearly appointment for hydrocele.

## 2018-12-12 NOTE — Telephone Encounter (Signed)
Spoke with patient's caregiver he is not on DPR and will need to be added at next visit. Results were given to patient over speaker phone, patient gave verbal consent for caregiver Madelynn Done to be on the line as well and would like results of u/s faxed to his home  Fax number [phone]

## 2019-12-11 NOTE — Progress Notes (Deleted)
12/12/2019 11:03 AM   Austin Marsh [birth year] [id number]  Referring provider: Koren Bound, NP [address] [place],  [place] [address]  No chief complaint on file.   HPI: Mr. Austin Marsh is a 64 year old male with a left hydrocele who presents today for follow up exam.    Presented to the [facility] ED on 05/03/2018 with a one-week history of pain and swelling in the left hemiscrotum.  His pain was rated moderate without identifiable precipitating, aggravating or alleviating factors.  A scrotal sonogram was performed which showed a large left hydrocele measuring up to 7 cm.    He underwent hydrocele aspiration with Dr. Lonna Marsh on 06/30/2018.  40 cc of aspirate was collected.     He is taking Flomax as prescribed by his PCP.  He also states his PCP checks his PSA's annually as well.    PMH: Past Medical History:  Diagnosis Date  . COPD (chronic obstructive pulmonary disease) (HCC)   . Schizoaffective disorder, bipolar type (HCC)   . Sleep apnea   . Tardive dyskinesia   . Thyroid disease     Surgical History: Past Surgical History:  Procedure Laterality Date  . APPENDECTOMY      Home Medications:  Allergies as of 12/12/2019      Reactions   Haldol [haloperidol Lactate] Other (See Comments)   Tardive diskonesia   Other    Navy Beans cause patient to vomit   Thorazine [chlorpromazine] Other (See Comments)   "messes me all up"   Trazodone And Nefazodone Other (See Comments)   "makes me feel like I'm dying."      Medication List       Accurate as of December 11, 2019 11:03 AM. If you have any questions, ask your nurse or doctor.        acetaminophen 325 MG tablet Commonly known as: TYLENOL Take 650 mg by mouth every 4 (four) hours as needed.   alum & mag hydroxide-simeth 200-200-20 MG/5ML suspension Commonly known as: MAALOX/MYLANTA Take 30 mLs by mouth daily as needed for indigestion or heartburn.   benztropine 2 MG tablet Commonly known as: COGENTIN Take  2 mg by mouth 2 (two) times daily.   bismuth subsalicylate 262 MG/15ML suspension Commonly known as: PEPTO BISMOL Take 15 mLs by mouth every 4 (four) hours as needed.   busPIRone 15 MG tablet Commonly known as: BUSPAR Take 15 mg by mouth 2 (two) times daily.   cloZAPine 100 MG tablet Commonly known as: CLOZARIL Take 100-200 mg by mouth daily. Take 100 mg in the morning and 200 mg at bedtime for scizophrenia   divalproex 500 MG 24 hr tablet Commonly known as: DEPAKOTE ER Take 1,000 mg by mouth at bedtime.   guaiFENesin 100 MG/5ML liquid Commonly known as: ROBITUSSIN Take 200 mg by mouth every 6 (six) hours as needed for cough.   levothyroxine 75 MCG tablet Commonly known as: SYNTHROID Take 75 mcg by mouth daily before breakfast.   loperamide 2 MG capsule Commonly known as: IMODIUM Take 2 mg by mouth as needed for diarrhea or loose stools.   magnesium hydroxide 400 MG/5ML suspension Commonly known as: MILK OF MAGNESIA Take 30 mLs by mouth daily as needed for mild constipation.   neomycin-bacitracin-polymyxin ointment Commonly known as: NEOSPORIN Apply 1 application topically as needed for wound care.   omeprazole 40 MG capsule Commonly known as: PRILOSEC Take 40 mg by mouth daily.   polyethylene glycol 17 g packet Commonly known as: MIRALAX /  GLYCOLAX Take 17 g by mouth daily.   sulfamethoxazole-trimethoprim 800-160 MG tablet Commonly known as: BACTRIM DS Take 1 tablet by mouth 2 (two) times daily.   tamsulosin 0.4 MG Caps capsule Commonly known as: FLOMAX Take 0.4 mg by mouth 2 (two) times daily.   Vitamin D (Cholecalciferol) 25 MCG (1000 UT) Tabs Take 25 mcg by mouth daily.       Allergies:  Allergies  Allergen Reactions  . Haldol [Haloperidol Lactate] Other (See Comments)    Tardive diskonesia  . Other     Navy Beans cause patient to vomit  . Thorazine [Chlorpromazine] Other (See Comments)    "messes me all up"  . Trazodone And Nefazodone Other  (See Comments)    "makes me feel like I'm dying."    Family History: No family history on file.  Social History:  reports that he has been smoking cigarettes. He has never used smokeless tobacco. He reports previous alcohol use. He reports previous drug use.  ROS: For pertinent review of systems please refer to history of present illness Physical Exam: There were no vitals taken for this visit.  Constitutional:  Well nourished. Alert and oriented, No acute distress. HEENT: [place] AT, moist mucus membranes.  Trachea midline Cardiovascular: No clubbing, cyanosis, or edema. Respiratory: Normal respiratory effort, no increased work of breathing. GI: Abdomen is soft, non tender, non distended, no abdominal masses. Liver and spleen not palpable.  No hernias appreciated.  Stool sample for occult testing is not indicated.   GU: No CVA tenderness.  No bladder fullness or masses.  Patient with circumcised/uncircumcised phallus. ***Foreskin easily retracted***  Urethral meatus is patent.  No penile discharge. No penile lesions or rashes. Scrotum without lesions, cysts, rashes and/or edema.  Testicles are located scrotally bilaterally. No masses are appreciated in the testicles. Left and right epididymis are normal. Rectal: Patient with  normal sphincter tone. Anus and perineum without scarring or rashes. No rectal masses are appreciated. Prostate is approximately *** grams, *** nodules are appreciated. Seminal vesicles are normal. Skin: No rashes, bruises or suspicious lesions. Lymph: No inguinal adenopathy. Neurologic: Grossly intact, no focal deficits, moving all 4 extremities. Psychiatric: Normal mood and affect.   Pertinent Imaging: CLINICAL DATA:  Insisted hydrocele, LEFT testicular lump  EXAM: SCROTAL ULTRASOUND  DOPPLER ULTRASOUND OF THE TESTICLES  TECHNIQUE: Complete ultrasound examination of the testicles, epididymis, and other scrotal structures was performed. Color and spectral  Doppler ultrasound were also utilized to evaluate blood flow to the testicles.  COMPARISON:  05/03/2018  FINDINGS: Right testicle  Measurements: 3.5 x 2.4 x 2.2 cm. Normal echogenicity without mass or calcification. Internal blood flow present on color Doppler imaging.  Left testicle  Measurements: 8.4 x 2.1 x 2.8 cm. Normal echogenicity without mass or calcification. Internal blood flow present on color Doppler imaging, symmetric with RIGHT  Right epididymis:  Normal in size and appearance.  Left epididymis:  Normal in size and appearance.  Hydrocele: RIGHT hydrocele measuring 4.8 x 1.9 x 3.1 cm, similar appearance to prior exam  Varicocele:  None visualized.  Pulsed Doppler interrogation of both testes demonstrates normal low resistance arterial and venous waveforms bilaterally.  No hernias visualized.  IMPRESSION: RIGHT hydrocele.  No LEFT testicular/scrotal mass identified.   Electronically Signed   By: Ulyses Southward M.D.   On: 12/11/2018 16:19     Assessment & Plan:    1. Hydrocele  Right hydrocele seen on scrotal ultrasound  No testicular masses  RTC in one year.  2. BPH Managed by PCP Will request previous PSA results   Michiel Cowboy, [facility]  [facility] [address] [place], [place] [address] [phone]

## 2019-12-12 ENCOUNTER — Encounter: Payer: Self-pay | Admitting: Urology

## 2019-12-12 ENCOUNTER — Ambulatory Visit: Payer: Medicare Other | Admitting: Urology

## 2020-01-23 ENCOUNTER — Other Ambulatory Visit: Payer: Self-pay

## 2020-01-23 ENCOUNTER — Emergency Department: Payer: Medicare Other

## 2020-01-23 ENCOUNTER — Emergency Department
Admission: EM | Admit: 2020-01-23 | Discharge: 2020-01-23 | Disposition: A | Discharge status: Home / Self Care | Payer: Medicare Other | Attending: Emergency Medicine | Admitting: Emergency Medicine

## 2020-01-23 DIAGNOSIS — F17210 Nicotine dependence, cigarettes, uncomplicated: Secondary | ICD-10-CM | POA: Diagnosis not present

## 2020-01-23 DIAGNOSIS — J189 Pneumonia, unspecified organism: Secondary | ICD-10-CM

## 2020-01-23 DIAGNOSIS — R531 Weakness: Secondary | ICD-10-CM

## 2020-01-23 DIAGNOSIS — J181 Lobar pneumonia, unspecified organism: Secondary | ICD-10-CM | POA: Insufficient documentation

## 2020-01-23 DIAGNOSIS — Z20822 Contact with and (suspected) exposure to covid-19: Secondary | ICD-10-CM | POA: Diagnosis not present

## 2020-01-23 LAB — URINALYSIS, COMPLETE (UACMP) WITH MICROSCOPIC
Bacteria, UA: NONE SEEN
Bilirubin Urine: NEGATIVE
Glucose, UA: NEGATIVE mg/dL
Hgb urine dipstick: NEGATIVE
Ketones, ur: NEGATIVE mg/dL
Leukocytes,Ua: NEGATIVE
Nitrite: NEGATIVE
Protein, ur: NEGATIVE mg/dL
Specific Gravity, Urine: 1.015 (ref 1.005–1.030)
pH: 6 (ref 5.0–8.0)

## 2020-01-23 LAB — CBC
HCT: 37.2 % — ABNORMAL LOW (ref 39.0–52.0)
Hemoglobin: 13.2 g/dL (ref 13.0–17.0)
MCH: 33.8 pg (ref 26.0–34.0)
MCHC: 35.5 g/dL (ref 30.0–36.0)
MCV: 95.4 fL (ref 80.0–100.0)
Platelets: 114 10*3/uL — ABNORMAL LOW (ref 150–400)
RBC: 3.9 MIL/uL — ABNORMAL LOW (ref 4.22–5.81)
RDW: 15 % (ref 11.5–15.5)
WBC: 9.7 10*3/uL (ref 4.0–10.5)
nRBC: 0 % (ref 0.0–0.2)

## 2020-01-23 LAB — BASIC METABOLIC PANEL
Anion gap: 7 (ref 5–15)
BUN: 12 mg/dL (ref 8–23)
CO2: 27 mmol/L (ref 22–32)
Calcium: 8.9 mg/dL (ref 8.9–10.3)
Chloride: 102 mmol/L (ref 98–111)
Creatinine, Ser: 1.16 mg/dL (ref 0.61–1.24)
GFR calc non Af Amer: >60 mL/min (ref >60)
Glucose, Bld: 92 mg/dL (ref 70–99)
Potassium: 4.6 mmol/L (ref 3.5–5.1)
Sodium: 136 mmol/L (ref 135–145)

## 2020-01-23 LAB — GLUCOSE, CAPILLARY: Glucose-Capillary: 86 mg/dL (ref 70–99)

## 2020-01-23 LAB — RESPIRATORY PANEL BY RT PCR (FLU A&B, COVID)
Influenza A by PCR: NEGATIVE
Influenza B by PCR: NEGATIVE
SARS Coronavirus 2 by RT PCR: NEGATIVE

## 2020-01-23 LAB — TSH: TSH: 3.341 u[IU]/mL (ref 0.350–4.500)

## 2020-01-23 MED ORDER — DOXYCYCLINE HYCLATE 100 MG PO TABS
100.0000 mg | ORAL_TABLET | Freq: Once | ORAL | Status: AC
  Administered 2020-01-23: 100 mg via ORAL
  Filled 2020-01-23: qty 1

## 2020-01-23 MED ORDER — SODIUM CHLORIDE 0.9 % IV SOLN
2.0000 g | Freq: Once | INTRAVENOUS | Status: AC
  Administered 2020-01-23: 2 g via INTRAVENOUS
  Filled 2020-01-23: qty 20

## 2020-01-23 MED ORDER — PREDNISONE 20 MG PO TABS: 40.0000 mg | ORAL_TABLET | Freq: Every day | ORAL | 0 refills | Status: AC

## 2020-01-23 MED ORDER — ALBUTEROL SULFATE HFA 108 (90 BASE) MCG/ACT IN AERS: 2.0000 | INHALATION_SPRAY | RESPIRATORY_TRACT | 0 refills | Status: AC | PRN

## 2020-01-23 MED ORDER — DOXYCYCLINE HYCLATE 100 MG PO CAPS: 100.0000 mg | ORAL_CAPSULE | Freq: Two times a day (BID) | ORAL | 0 refills | Status: AC

## 2020-01-23 MED ORDER — PREDNISONE 20 MG PO TABS
40.0000 mg | ORAL_TABLET | Freq: Once | ORAL | Status: AC
  Administered 2020-01-23: 40 mg via ORAL
  Filled 2020-01-23: qty 2

## 2020-01-23 MED ORDER — IPRATROPIUM-ALBUTEROL 0.5-2.5 (3) MG/3ML IN SOLN
3.0000 mL | Freq: Once | RESPIRATORY_TRACT | Status: AC
  Administered 2020-01-23: 3 mL via RESPIRATORY_TRACT
  Filled 2020-01-23: qty 3

## 2020-01-23 MED ORDER — AMOXICILLIN-POT CLAVULANATE 875-125 MG PO TABS: 1.0000 | ORAL_TABLET | Freq: Two times a day (BID) | ORAL | 0 refills | Status: AC

## 2020-01-23 MED ORDER — SODIUM CHLORIDE 0.9 % IV BOLUS
500.0000 mL | Freq: Once | INTRAVENOUS | Status: AC
  Administered 2020-01-23: 500 mL via INTRAVENOUS

## 2020-01-23 NOTE — ED Notes (Signed)
Attempted to call pt's group home, no answer and mailbox full. Will attempt again shortly .

## 2020-01-23 NOTE — ED Notes (Signed)
Pt given juice to drink.

## 2020-01-23 NOTE — ED Triage Notes (Addendum)
Patient arrived by EMS from group home [facility]. C/o weakness today, cough X1 week, and reports fall that occurred 01/17/20. EMS vitals: 126/72 b/p, 67P, 98% RA

## 2020-01-23 NOTE — ED Provider Notes (Signed)
[facility] Emergency Department Provider Note  ____________________________________________   First MD Initiated Contact with Patient 01/23/20 1903     (approximate)  I have reviewed the triage vital signs and the nursing notes.   HISTORY  Chief Complaint Weakness    HPI Austin Marsh is a 64 y.o. male  With h/o COPD, schizoaffective d/o with TD, here with generalized weakness. Pt reports that his sx started prior to the weekend when he ate chips and had a "sore throat and mouth." Since then, reports he hasn't wanted much to eat or drink. He has subsequently become weaker and weaker, and reports that he has been getting lightheaded with standing up. He has had some falls as well, which he states are due to his legs "giving out." He uses a walker and this has been a recurrent issue on further interview. Reports that he has also had a mild cough. No sputum production. No CP or SOB. Sx all seem worse standing or exerting himself. No alleviating factors.       Past Medical History:  Diagnosis Date  . COPD (chronic obstructive pulmonary disease) (HCC)   . Schizoaffective disorder, bipolar type (HCC)   . Sleep apnea   . Tardive dyskinesia   . Thyroid disease     Patient Active Problem List   Diagnosis Date Noted  . Scrotal pain 06/26/2018    Past Surgical History:  Procedure Laterality Date  . APPENDECTOMY      Prior to Admission medications   Medication Sig Start Date End Date Taking? Authorizing Provider  acetaminophen (TYLENOL) 325 MG tablet Take 650 mg by mouth every 4 (four) hours as needed.    [provider]  albuterol (VENTOLIN HFA) 108 (90 Base) MCG/ACT inhaler Inhale 2 puffs into the lungs every 4 (four) hours as needed for wheezing or shortness of breath. 01/23/20   Shaune Pollack, MD  alum & mag hydroxide-simeth (MAALOX/MYLANTA) 200-200-20 MG/5ML suspension Take 30 mLs by mouth daily as needed for indigestion or heartburn.     [provider]  amoxicillin-clavulanate (AUGMENTIN) 875-125 MG tablet Take 1 tablet by mouth 2 (two) times daily for 7 days. 01/23/20 01/30/20  Shaune Pollack, MD  benztropine (COGENTIN) 2 MG tablet Take 2 mg by mouth 2 (two) times daily.    [provider]  bismuth subsalicylate (PEPTO BISMOL) 262 MG/15ML suspension Take 15 mLs by mouth every 4 (four) hours as needed.    [provider]  busPIRone (BUSPAR) 15 MG tablet Take 15 mg by mouth 2 (two) times daily.    [provider]  cloZAPine (CLOZARIL) 100 MG tablet Take 100-200 mg by mouth daily. Take 100 mg in the morning and 200 mg at bedtime for scizophrenia    [provider]  divalproex (DEPAKOTE ER) 500 MG 24 hr tablet Take 1,000 mg by mouth at bedtime.    [provider]  doxycycline (VIBRAMYCIN) 100 MG capsule Take 1 capsule (100 mg total) by mouth 2 (two) times daily for 7 days. 01/23/20 01/30/20  Shaune Pollack, MD  guaiFENesin (ROBITUSSIN) 100 MG/5ML liquid Take 200 mg by mouth every 6 (six) hours as needed for cough.    [provider]  levothyroxine (SYNTHROID, LEVOTHROID) 75 MCG tablet Take 75 mcg by mouth daily before breakfast.    [provider]  loperamide (IMODIUM) 2 MG capsule Take 2 mg by mouth as needed for diarrhea or loose stools.    [provider]  magnesium hydroxide (MILK OF  MAGNESIA) 400 MG/5ML suspension Take 30 mLs by mouth daily as needed for mild constipation.    [provider]  neomycin-bacitracin-polymyxin (NEOSPORIN) ointment Apply 1 application topically as needed for wound care.    [provider]  omeprazole (PRILOSEC) 40 MG capsule Take 40 mg by mouth daily.     [provider]  polyethylene glycol (MIRALAX / GLYCOLAX) packet Take 17 g by mouth daily.    [provider]  predniSONE (DELTASONE) 20 MG tablet Take 2 tablets (40 mg total) by mouth daily for 5 days. 01/23/20 01/28/20  Shaune Pollack,  MD  tamsulosin (FLOMAX) 0.4 MG CAPS capsule Take 0.4 mg by mouth 2 (two) times daily.    [provider]  Vitamin D, Cholecalciferol, 25 MCG (1000 UT) TABS Take 25 mcg by mouth daily.    [provider]    Allergies Haldol [haloperidol lactate], Other, Thorazine [chlorpromazine], and Trazodone and nefazodone  No family history on file.  Social History Social History   Tobacco Use  . Smoking status: Current Every Day Smoker    Types: Cigarettes  . Smokeless tobacco: Never Used  Substance Use Topics  . Alcohol use: Not Currently  . Drug use: Not Currently    Review of Systems  Review of Systems  Constitutional: Positive for fatigue. Negative for chills and fever.  HENT: Negative for sore throat.   Respiratory: Positive for cough. Negative for shortness of breath.   Cardiovascular: Negative for chest pain.  Gastrointestinal: Negative for abdominal pain.  Genitourinary: Negative for flank pain.  Musculoskeletal: Positive for back pain. Negative for neck pain.  Skin: Negative for rash and wound.  Allergic/Immunologic: Negative for immunocompromised state.  Neurological: Positive for weakness. Negative for numbness.  Hematological: Does not bruise/bleed easily.  All other systems reviewed and are negative.    ____________________________________________  PHYSICAL EXAM:      VITAL SIGNS: ED Triage Vitals  Enc Vitals Group     BP 01/23/20 1455 96/61     Pulse Rate 01/23/20 1455 64     Resp 01/23/20 1455 20     Temp 01/23/20 1455 98.5 F (36.9 C)     Temp Source 01/23/20 1455 Oral     SpO2 01/23/20 1455 98 %     Weight 01/23/20 1535 180 lb (81.6 kg)     Height 01/23/20 1535 5\' 9"  (1.753 m)     Head Circumference --      Peak Flow --      Pain Score 01/23/20 1534 3     Pain Loc --      Pain Edu? --      Excl. in GC? --      Physical Exam Vitals and nursing note reviewed.  Constitutional:      General: He is not in acute distress.     Appearance: He is well-developed.  HENT:     Head: Normocephalic and atraumatic.     Mouth/Throat:     Mouth: Mucous membranes are dry.  Eyes:     Conjunctiva/sclera: Conjunctivae normal.  Cardiovascular:     Rate and Rhythm: Normal rate and regular rhythm.     Heart sounds: Normal heart sounds. No murmur heard.  No friction rub.  Pulmonary:     Effort: Pulmonary effort is normal. No respiratory distress.     Breath sounds: Normal breath sounds. No wheezing or rales.  Abdominal:     General: There is no distension.     Palpations: Abdomen is soft.  Tenderness: There is no abdominal tenderness.  Musculoskeletal:     Cervical back: Neck supple.  Skin:    General: Skin is warm.     Capillary Refill: Capillary refill takes less than 2 seconds.  Neurological:     Mental Status: He is alert and oriented to person, place, and time.     Motor: No abnormal muscle tone.       ____________________________________________   LABS (all labs ordered are listed, but only abnormal results are displayed)  Labs Reviewed  CBC - Abnormal; Notable for the following components:      Result Value   RBC 3.90 (*)    HCT 37.2 (*)    Platelets 114 (*)    All other components within normal limits  URINALYSIS, COMPLETE (UACMP) WITH MICROSCOPIC - Abnormal; Notable for the following components:   Color, Urine YELLOW (*)    APPearance CLEAR (*)    All other components within normal limits  RESPIRATORY PANEL BY RT PCR (FLU A&B, COVID)  BASIC METABOLIC PANEL  GLUCOSE, CAPILLARY  TSH  CBG MONITORING, ED    ____________________________________________  EKG: Normal sinus rhythm, VR 65. PR 114, QRS 66, QTc 418. Low voltage QRS, normal sinus rhythm. No acute St elevations or depressions. ________________________________________  RADIOLOGY All imaging, including plain films, CT scans, and ultrasounds, independently reviewed by me, and interpretations confirmed via formal radiology reads.  ED  MD interpretation:   CT Head: NAICA CXR: Possible RUL infiltrate XR Lumbar: Degen disease, no acute abnormality  Official radiology report(s): DG Chest 2 View  Result Date: 01/23/2020 CLINICAL DATA:  Fall, weakness, cough EXAM: CHEST - 2 VIEW COMPARISON:  06/21/2018, 06/11/2014 FINDINGS: There is a a subtle focal pulmonary infiltrate within the right upper lobe, possibly infectious in the appropriate clinical setting. No pneumothorax or pleural effusion. Cardiac size is within normal limits. Pulmonary vascularity is normal. Osseous structures are age-appropriate. IMPRESSION: Right upper lobe focal pulmonary infiltrate, possibly infectious in the appropriate clinical setting. Electronically Signed   By: Helyn Numbers MD   On: 01/23/2020 20:00   DG Lumbar Spine Complete  Result Date: 01/23/2020 CLINICAL DATA:  Fall and weakness EXAM: LUMBAR SPINE - COMPLETE 4+ VIEW COMPARISON:  None. FINDINGS: There is no evidence of lumbar spine fracture. Alignment is normal. Intervertebral disc spaces are maintained. Aortic atherosclerosis. Minimal degenerative osteophytes. IMPRESSION: No acute osseous abnormality. Electronically Signed   By: Jasmine Pang M.D.   On: 01/23/2020 19:58   CT Head Wo Contrast  Result Date: 01/23/2020 CLINICAL DATA:  Head trauma fall EXAM: CT HEAD WITHOUT CONTRAST TECHNIQUE: Contiguous axial images were obtained from the base of the skull through the vertex without intravenous contrast. COMPARISON:  CT brain 06/21/2018 FINDINGS: Brain: No acute territorial infarction, hemorrhage, or intracranial mass. Moderate-to-marked atrophy. Mild hypodensity in the white matter likely chronic small vessel ischemic change. Stable ventricle size Vascular: No hyperdense vessels.  Carotid vascular calcification Skull: Normal. Negative for fracture or focal lesion. Sinuses/Orbits: No acute finding. Other: None IMPRESSION: 1. No CT evidence for acute intracranial abnormality. 2. Atrophy and mild chronic  small vessel ischemic changes of the white matter. Electronically Signed   By: Jasmine Pang M.D.   On: 01/23/2020 20:07    ____________________________________________  PROCEDURES   Procedure(s) performed (including Critical Care):  Procedures  ____________________________________________  INITIAL IMPRESSION / MDM / ASSESSMENT AND PLAN / ED COURSE  As part of my medical decision making, I reviewed the following data within the electronic MEDICAL RECORD NUMBER  Nursing notes reviewed and incorporated, Old chart reviewed, Notes from prior ED visits, and [place] Controlled Substance Database       *Dahlia BailiffRobin Ghazi was evaluated in Emergency Department on 01/23/2020 for the symptoms described in the history of present illness. He was evaluated in the context of the global COVID-19 pandemic, which necessitated consideration that the patient might be at risk for infection with the SARS-CoV-2 virus that causes COVID-19. Institutional protocols and algorithms that pertain to the evaluation of patients at risk for COVID-19 are in a state of rapid change based on information released by regulatory bodies including the CDC and federal and state organizations. These policies and algorithms were followed during the patient's care in the ED.  Some ED evaluations and interventions may be delayed as a result of limited staffing during the pandemic.*     Medical Decision Making:  64 yo M here with generalized weakness. History somewhat difficult 2/2 his schizoaffective d/o, but overall he is very well appearing and in NAD. VSS, satting >95% on RA. EKG non ischemic. Labs obtained and reviewed - normal WBC, normal BMP, TSH wnl, UA unremarkable. No signs of sepsis or systemic illness. Reviewed imaging including CT head, CXR, and XR Lumbar - most notable for possible RUL infiltrate on CXR, which could explain his cough and weakness. NAICA on CT Head. Lumbar films unremarkable and he has 5/5 strength, no signs of LE  radiculopathy or cauda equina clinically.  Suspect generalized weakness 2/2 CAP with COPD. Will treat with steroids, ABX. Given normal WOB, sats >95% on RA, no SOB, will tx as outpatient with good return precautions.  ____________________________________________  FINAL CLINICAL IMPRESSION(S) / ED DIAGNOSES  Final diagnoses:  Generalized weakness  Community acquired pneumonia of right lung, unspecified part of lung     MEDICATIONS GIVEN DURING THIS VISIT:  Medications  sodium chloride 0.9 % bolus 500 mL (0 mLs Intravenous Stopped 01/23/20 2150)  ipratropium-albuterol (DUONEB) 0.5-2.5 (3) MG/3ML nebulizer solution 3 mL (3 mLs Nebulization Given 01/23/20 2034)  cefTRIAXone (ROCEPHIN) 2 g in sodium chloride 0.9 % 100 mL IVPB (0 g Intravenous Stopped 01/23/20 2122)  doxycycline (VIBRA-TABS) tablet 100 mg (100 mg Oral Given 01/23/20 2034)  predniSONE (DELTASONE) tablet 40 mg (40 mg Oral Given 01/23/20 2034)     ED Discharge Orders         Ordered    predniSONE (DELTASONE) 20 MG tablet  Daily        01/23/20 2010    doxycycline (VIBRAMYCIN) 100 MG capsule  2 times daily        01/23/20 2010    amoxicillin-clavulanate (AUGMENTIN) 875-125 MG tablet  2 times daily        01/23/20 2010    albuterol (VENTOLIN HFA) 108 (90 Base) MCG/ACT inhaler  Every 4 hours PRN        01/23/20 2010           Note:  This document was prepared using Dragon voice recognition software and may include unintentional dictation errors.   Shaune PollackIsaacs, Almena Hokenson, MD 01/23/20 2252

## 2020-01-23 NOTE — ED Triage Notes (Signed)
Pt to the er for pain all over and weakness. Pt states he got up to urinate 30 times last night. Pt lives in a group home. Pt has spasms in both hands.

## 2020-01-23 NOTE — Discharge Instructions (Signed)
Start the following meds:  Prednisone  Augmentin (antibiotic) Doxycycline (second antibiotic)  Use the albuterol inhaler every 4 hours for wheezing for the next day, then every 4-6 hr as needed  Drink plenty of fluids  Return to the Er as needed

## 2020-03-11 ENCOUNTER — Inpatient Hospital Stay
Admission: EM | Admit: 2020-03-11 | Discharge: 2020-03-17 | DRG: 872 | Disposition: A | Discharge status: Skilled Nursing Facility | Payer: Medicare Other | Attending: Internal Medicine | Admitting: Internal Medicine

## 2020-03-11 ENCOUNTER — Emergency Department: Payer: Medicare Other

## 2020-03-11 ENCOUNTER — Other Ambulatory Visit: Payer: Self-pay

## 2020-03-11 DIAGNOSIS — R296 Repeated falls: Secondary | ICD-10-CM | POA: Diagnosis present

## 2020-03-11 DIAGNOSIS — A419 Sepsis, unspecified organism: Secondary | ICD-10-CM | POA: Diagnosis present

## 2020-03-11 DIAGNOSIS — J449 Chronic obstructive pulmonary disease, unspecified: Secondary | ICD-10-CM | POA: Diagnosis present

## 2020-03-11 DIAGNOSIS — N3000 Acute cystitis without hematuria: Secondary | ICD-10-CM

## 2020-03-11 DIAGNOSIS — E039 Hypothyroidism, unspecified: Secondary | ICD-10-CM | POA: Diagnosis present

## 2020-03-11 DIAGNOSIS — Z20822 Contact with and (suspected) exposure to covid-19: Secondary | ICD-10-CM | POA: Diagnosis present

## 2020-03-11 DIAGNOSIS — F17210 Nicotine dependence, cigarettes, uncomplicated: Secondary | ICD-10-CM | POA: Diagnosis present

## 2020-03-11 DIAGNOSIS — F418 Other specified anxiety disorders: Secondary | ICD-10-CM | POA: Diagnosis present

## 2020-03-11 DIAGNOSIS — Z79890 Hormone replacement therapy (postmenopausal): Secondary | ICD-10-CM | POA: Diagnosis not present

## 2020-03-11 DIAGNOSIS — R531 Weakness: Secondary | ICD-10-CM

## 2020-03-11 DIAGNOSIS — N390 Urinary tract infection, site not specified: Secondary | ICD-10-CM | POA: Diagnosis present

## 2020-03-11 DIAGNOSIS — Z720 Tobacco use: Secondary | ICD-10-CM | POA: Diagnosis not present

## 2020-03-11 DIAGNOSIS — Z9049 Acquired absence of other specified parts of digestive tract: Secondary | ICD-10-CM

## 2020-03-11 DIAGNOSIS — D696 Thrombocytopenia, unspecified: Secondary | ICD-10-CM | POA: Diagnosis not present

## 2020-03-11 DIAGNOSIS — F250 Schizoaffective disorder, bipolar type: Secondary | ICD-10-CM | POA: Diagnosis present

## 2020-03-11 DIAGNOSIS — W19XXXA Unspecified fall, initial encounter: Secondary | ICD-10-CM | POA: Diagnosis present

## 2020-03-11 DIAGNOSIS — Z87440 Personal history of urinary (tract) infections: Secondary | ICD-10-CM | POA: Diagnosis not present

## 2020-03-11 DIAGNOSIS — Z79899 Other long term (current) drug therapy: Secondary | ICD-10-CM | POA: Diagnosis not present

## 2020-03-11 DIAGNOSIS — Z888 Allergy status to other drugs, medicaments and biological substances status: Secondary | ICD-10-CM | POA: Diagnosis not present

## 2020-03-11 DIAGNOSIS — N179 Acute kidney failure, unspecified: Secondary | ICD-10-CM | POA: Diagnosis present

## 2020-03-11 DIAGNOSIS — N3001 Acute cystitis with hematuria: Secondary | ICD-10-CM

## 2020-03-11 DIAGNOSIS — E860 Dehydration: Secondary | ICD-10-CM | POA: Diagnosis not present

## 2020-03-11 LAB — CBC WITH DIFFERENTIAL/PLATELET
Abs Immature Granulocytes: 0.82 10*3/uL — ABNORMAL HIGH (ref 0.00–0.07)
Basophils Absolute: 0.1 10*3/uL (ref 0.0–0.1)
Basophils Relative: 0 %
Eosinophils Absolute: 0.1 10*3/uL (ref 0.0–0.5)
Eosinophils Relative: 0 %
HCT: 38.4 % — ABNORMAL LOW (ref 39.0–52.0)
Hemoglobin: 13.1 g/dL (ref 13.0–17.0)
Immature Granulocytes: 3 %
Lymphocytes Relative: 5 %
Lymphs Abs: 1.7 10*3/uL (ref 0.7–4.0)
MCH: 33.6 pg (ref 26.0–34.0)
MCHC: 34.1 g/dL (ref 30.0–36.0)
MCV: 98.5 fL (ref 80.0–100.0)
Monocytes Absolute: 3.6 10*3/uL — ABNORMAL HIGH (ref 0.1–1.0)
Monocytes Relative: 11 %
Neutro Abs: 25.7 10*3/uL — ABNORMAL HIGH (ref 1.7–7.7)
Neutrophils Relative %: 81 %
Platelets: 125 10*3/uL — ABNORMAL LOW (ref 150–400)
RBC: 3.9 MIL/uL — ABNORMAL LOW (ref 4.22–5.81)
RDW: 14.9 % (ref 11.5–15.5)
Smear Review: NORMAL
WBC: 32.1 10*3/uL — ABNORMAL HIGH (ref 4.0–10.5)
nRBC: 0 % (ref 0.0–0.2)

## 2020-03-11 LAB — URINALYSIS, COMPLETE (UACMP) WITH MICROSCOPIC
Bilirubin Urine: NEGATIVE
Glucose, UA: NEGATIVE mg/dL
Ketones, ur: 5 mg/dL — AB
Nitrite: NEGATIVE
Protein, ur: 100 mg/dL — AB
Specific Gravity, Urine: 1.02 (ref 1.005–1.030)
Squamous Epithelial / HPF: NONE SEEN (ref 0–5)
WBC, UA: >50 WBC/hpf — ABNORMAL HIGH (ref 0–5)
pH: 5 (ref 5.0–8.0)

## 2020-03-11 LAB — RESP PANEL BY RT-PCR (FLU A&B, COVID) ARPGX2
Influenza A by PCR: NEGATIVE
Influenza B by PCR: NEGATIVE
SARS Coronavirus 2 by RT PCR: NEGATIVE

## 2020-03-11 LAB — COMPREHENSIVE METABOLIC PANEL
ALT: 8 U/L (ref 0–44)
AST: 18 U/L (ref 15–41)
Albumin: 3.1 g/dL — ABNORMAL LOW (ref 3.5–5.0)
Alkaline Phosphatase: 59 U/L (ref 38–126)
Anion gap: 10 (ref 5–15)
BUN: 20 mg/dL (ref 8–23)
CO2: 26 mmol/L (ref 22–32)
Calcium: 9.2 mg/dL (ref 8.9–10.3)
Chloride: 102 mmol/L (ref 98–111)
Creatinine, Ser: 1.79 mg/dL — ABNORMAL HIGH (ref 0.61–1.24)
GFR, Estimated: 41 mL/min — ABNORMAL LOW (ref >60)
Glucose, Bld: 100 mg/dL — ABNORMAL HIGH (ref 70–99)
Potassium: 4.2 mmol/L (ref 3.5–5.1)
Sodium: 138 mmol/L (ref 135–145)
Total Bilirubin: 1.2 mg/dL (ref 0.3–1.2)
Total Protein: 6.4 g/dL — ABNORMAL LOW (ref 6.5–8.1)

## 2020-03-11 LAB — LACTIC ACID, PLASMA: Lactic Acid, Venous: 1.3 mmol/L (ref 0.5–1.9)

## 2020-03-11 LAB — PROCALCITONIN: Procalcitonin: 0.3 ng/mL

## 2020-03-11 MED ORDER — SODIUM CHLORIDE 0.9 % IV SOLN
1.0000 g | INTRAVENOUS | Status: DC
  Administered 2020-03-12 – 2020-03-17 (×6): 1 g via INTRAVENOUS
  Filled 2020-03-11 (×4): qty 1
  Filled 2020-03-11: qty 10
  Filled 2020-03-11: qty 1
  Filled 2020-03-11: qty 10

## 2020-03-11 MED ORDER — GABAPENTIN 100 MG PO CAPS
200.0000 mg | ORAL_CAPSULE | Freq: Every day | ORAL | Status: DC
  Administered 2020-03-11 – 2020-03-16 (×6): 200 mg via ORAL
  Filled 2020-03-11 (×6): qty 2

## 2020-03-11 MED ORDER — ALUM & MAG HYDROXIDE-SIMETH 200-200-20 MG/5ML PO SUSP: 30.0000 mL | Freq: Every day | ORAL | Status: DC | PRN

## 2020-03-11 MED ORDER — CLOZAPINE 100 MG PO TABS
200.0000 mg | ORAL_TABLET | Freq: Every day | ORAL | Status: DC
  Administered 2020-03-11 – 2020-03-16 (×6): 200 mg via ORAL
  Filled 2020-03-11 (×7): qty 2

## 2020-03-11 MED ORDER — SODIUM CHLORIDE 0.9 % IV BOLUS
1000.0000 mL | Freq: Once | INTRAVENOUS | Status: AC
  Administered 2020-03-11: 1000 mL via INTRAVENOUS

## 2020-03-11 MED ORDER — BUSPIRONE HCL 15 MG PO TABS
15.0000 mg | ORAL_TABLET | Freq: Two times a day (BID) | ORAL | Status: DC
  Administered 2020-03-11 – 2020-03-16 (×11): 15 mg via ORAL
  Filled 2020-03-11 (×14): qty 1

## 2020-03-11 MED ORDER — SODIUM CHLORIDE 0.9 % IV SOLN: INTRAVENOUS | Status: DC

## 2020-03-11 MED ORDER — MAGNESIUM HYDROXIDE 400 MG/5ML PO SUSP: 30.0000 mL | Freq: Every day | ORAL | Status: DC | PRN

## 2020-03-11 MED ORDER — PANTOPRAZOLE SODIUM 40 MG PO TBEC
40.0000 mg | DELAYED_RELEASE_TABLET | Freq: Every day | ORAL | Status: DC
  Administered 2020-03-12 – 2020-03-17 (×6): 40 mg via ORAL
  Filled 2020-03-11 (×6): qty 1

## 2020-03-11 MED ORDER — DIVALPROEX SODIUM ER 500 MG PO TB24
1000.0000 mg | ORAL_TABLET | Freq: Every day | ORAL | Status: DC
  Administered 2020-03-11 – 2020-03-16 (×6): 1000 mg via ORAL
  Filled 2020-03-11 (×8): qty 2

## 2020-03-11 MED ORDER — ALBUTEROL SULFATE HFA 108 (90 BASE) MCG/ACT IN AERS
2.0000 | INHALATION_SPRAY | RESPIRATORY_TRACT | Status: DC | PRN
  Filled 2020-03-11: qty 6.7

## 2020-03-11 MED ORDER — CLOZAPINE 100 MG PO TABS
100.0000 mg | ORAL_TABLET | Freq: Every day | ORAL | Status: DC
  Administered 2020-03-12 – 2020-03-17 (×5): 100 mg via ORAL
  Filled 2020-03-11 (×6): qty 1

## 2020-03-11 MED ORDER — ACETAMINOPHEN 325 MG PO TABS
650.0000 mg | ORAL_TABLET | Freq: Four times a day (QID) | ORAL | Status: DC | PRN
  Administered 2020-03-16: 650 mg via ORAL
  Filled 2020-03-11: qty 2

## 2020-03-11 MED ORDER — ONDANSETRON HCL 4 MG/2ML IJ SOLN: 4.0000 mg | Freq: Three times a day (TID) | INTRAMUSCULAR | Status: DC | PRN

## 2020-03-11 MED ORDER — NICOTINE 21 MG/24HR TD PT24
21.0000 mg | MEDICATED_PATCH | Freq: Every day | TRANSDERMAL | Status: DC
  Administered 2020-03-11 – 2020-03-17 (×7): 21 mg via TRANSDERMAL
  Filled 2020-03-11 (×7): qty 1

## 2020-03-11 MED ORDER — ENOXAPARIN SODIUM 40 MG/0.4ML ~~LOC~~ SOLN
40.0000 mg | SUBCUTANEOUS | Status: DC
  Administered 2020-03-11 – 2020-03-12 (×2): 40 mg via SUBCUTANEOUS
  Filled 2020-03-11 (×2): qty 0.4

## 2020-03-11 MED ORDER — TAMSULOSIN HCL 0.4 MG PO CAPS
0.4000 mg | ORAL_CAPSULE | Freq: Two times a day (BID) | ORAL | Status: DC
  Administered 2020-03-11 – 2020-03-17 (×12): 0.4 mg via ORAL
  Filled 2020-03-11 (×12): qty 1

## 2020-03-11 MED ORDER — SODIUM CHLORIDE 0.9 % IV SOLN
2.0000 g | Freq: Once | INTRAVENOUS | Status: AC
  Administered 2020-03-11: 2 g via INTRAVENOUS
  Filled 2020-03-11: qty 20

## 2020-03-11 MED ORDER — DM-GUAIFENESIN ER 30-600 MG PO TB12
1.0000 | ORAL_TABLET | Freq: Two times a day (BID) | ORAL | Status: DC | PRN
  Administered 2020-03-12 – 2020-03-15 (×2): 1 via ORAL
  Filled 2020-03-11 (×2): qty 1

## 2020-03-11 MED ORDER — VITAMIN D 25 MCG (1000 UNIT) PO TABS
2000.0000 [IU] | ORAL_TABLET | Freq: Every day | ORAL | Status: DC
  Administered 2020-03-12 – 2020-03-17 (×6): 2000 [IU] via ORAL
  Filled 2020-03-11 (×6): qty 2

## 2020-03-11 MED ORDER — VITAMIN B-12 100 MCG PO TABS
100.0000 ug | ORAL_TABLET | Freq: Every day | ORAL | Status: DC
  Administered 2020-03-12 – 2020-03-16 (×5): 100 ug via ORAL
  Filled 2020-03-11 (×6): qty 1

## 2020-03-11 MED ORDER — LEVOTHYROXINE SODIUM 50 MCG PO TABS
75.0000 ug | ORAL_TABLET | Freq: Every day | ORAL | Status: DC
  Administered 2020-03-12 – 2020-03-17 (×6): 75 ug via ORAL
  Filled 2020-03-11 (×6): qty 2

## 2020-03-11 MED ORDER — BENZTROPINE MESYLATE 2 MG PO TABS
2.0000 mg | ORAL_TABLET | Freq: Two times a day (BID) | ORAL | Status: DC
  Administered 2020-03-11 – 2020-03-17 (×12): 2 mg via ORAL
  Filled 2020-03-11 (×14): qty 1

## 2020-03-11 MED ORDER — LOPERAMIDE HCL 2 MG PO CAPS: 2.0000 mg | ORAL_CAPSULE | ORAL | Status: DC | PRN

## 2020-03-11 NOTE — ED Notes (Signed)
Report given to Brandy, RN.

## 2020-03-11 NOTE — ED Provider Notes (Signed)
[facility] Emergency Department Provider Note  ____________________________________________   First MD Initiated Contact with Patient 03/11/20 1115     (approximate)  I have reviewed the triage vital signs and the nursing notes.   HISTORY  Chief Complaint Fall (group home sent for frequent falls and frequent urination )    HPI Austin Marsh is a 64 y.o. male with history of COPD, schizoaffective disorder, here with generalized weakness and falls.  The patient reportedly has had multiple falls over the last 48 hours.  He has a history of recurrent UTIs, and states that he has been feeling like he needs to urinate more frequently.  He tried to get up several times overnight to urinate, and was so weak that he fell down.  He has had associated general malaise.  He has had some mild nausea.  Denies any shortness of breath.  No cough.        Past Medical History:  Diagnosis Date  . COPD (chronic obstructive pulmonary disease) (HCC)   . Schizoaffective disorder, bipolar type (HCC)   . Sleep apnea   . Tardive dyskinesia   . Thyroid disease     Patient Active Problem List   Diagnosis Date Noted  . Scrotal pain 06/26/2018    Past Surgical History:  Procedure Laterality Date  . APPENDECTOMY      Prior to Admission medications   Medication Sig Start Date End Date Taking? Authorizing Provider  acetaminophen (TYLENOL) 325 MG tablet Take 650 mg by mouth every 4 (four) hours as needed.    [provider]  albuterol (VENTOLIN HFA) 108 (90 Base) MCG/ACT inhaler Inhale 2 puffs into the lungs every 4 (four) hours as needed for wheezing or shortness of breath. 01/23/20   Shaune Pollack, MD  alum & mag hydroxide-simeth (MAALOX/MYLANTA) 200-200-20 MG/5ML suspension Take 30 mLs by mouth daily as needed for indigestion or heartburn.    [provider]  benztropine (COGENTIN) 2 MG tablet Take 2 mg by mouth 2 (two) times daily.    [provider]  bismuth subsalicylate (PEPTO BISMOL) 262 MG/15ML suspension Take 15 mLs by mouth every 4 (four) hours as needed.    [provider]  busPIRone (BUSPAR) 15 MG tablet Take 15 mg by mouth 2 (two) times daily.    [provider]  cloZAPine (CLOZARIL) 100 MG tablet Take 100-200 mg by mouth daily. Take 100 mg in the morning and 200 mg at bedtime for scizophrenia    [provider]  divalproex (DEPAKOTE ER) 500 MG 24 hr tablet Take 1,000 mg by mouth at bedtime.    [provider]  guaiFENesin (ROBITUSSIN) 100 MG/5ML liquid Take 200 mg by mouth every 6 (six) hours as needed for cough.    [provider]  levothyroxine (SYNTHROID, LEVOTHROID) 75 MCG tablet Take 75 mcg by mouth daily before breakfast.    [provider]  loperamide (IMODIUM) 2 MG capsule Take 2 mg by mouth as needed for diarrhea or loose stools.    [provider]  magnesium hydroxide (MILK OF MAGNESIA) 400 MG/5ML suspension Take 30 mLs by mouth daily as needed for mild constipation.    [provider]  neomycin-bacitracin-polymyxin (NEOSPORIN) ointment Apply 1 application topically as needed for wound care.    [provider]  omeprazole (PRILOSEC) 40 MG capsule Take 40 mg by mouth daily.     [provider]  polyethylene glycol (MIRALAX / GLYCOLAX) packet Take 17 g by mouth  daily.    [provider]  tamsulosin (FLOMAX) 0.4 MG CAPS capsule Take 0.4 mg by mouth 2 (two) times daily.    [provider]  Vitamin D, Cholecalciferol, 25 MCG (1000 UT) TABS Take 25 mcg by mouth daily.    [provider]    Allergies Haldol [haloperidol lactate], Other, Thorazine [chlorpromazine], and Trazodone and nefazodone  History reviewed. No pertinent family history.  Social History Social History   Tobacco Use  . Smoking status: Current Every Day Smoker    Types: Cigarettes  . Smokeless tobacco: Never Used  Substance  Use Topics  . Alcohol use: Not Currently  . Drug use: Not Currently    Review of Systems  Review of Systems  Constitutional: Positive for fatigue. Negative for chills and fever.  HENT: Negative for sore throat.   Respiratory: Negative for shortness of breath.   Cardiovascular: Negative for chest pain.  Gastrointestinal: Negative for abdominal pain.  Genitourinary: Positive for dysuria and frequency. Negative for flank pain.  Musculoskeletal: Negative for neck pain.  Skin: Negative for rash and wound.  Allergic/Immunologic: Negative for immunocompromised state.  Neurological: Positive for weakness. Negative for numbness.  Hematological: Does not bruise/bleed easily.  All other systems reviewed and are negative.    ____________________________________________  PHYSICAL EXAM:      VITAL SIGNS: ED Triage Vitals  Enc Vitals Group     BP 03/11/20 1106 101/61     Pulse Rate 03/11/20 1104 100     Resp 03/11/20 1104 16     Temp 03/11/20 1104 98 F (36.7 C)     Temp Source 03/11/20 1104 Oral     SpO2 03/11/20 1104 94 %     Weight 03/11/20 1106 179 lb 14.3 oz (81.6 kg)     Height 03/11/20 1106 5\' 9"  (1.753 m)     Head Circumference --      Peak Flow --      Pain Score 03/11/20 1105 0     Pain Loc --      Pain Edu? --      Excl. in GC? --      Physical Exam Vitals and nursing note reviewed.  Constitutional:      General: He is not in acute distress.    Appearance: He is well-developed.  HENT:     Head: Normocephalic and atraumatic.     Mouth/Throat:     Mouth: Mucous membranes are dry.  Eyes:     Conjunctiva/sclera: Conjunctivae normal.  Cardiovascular:     Rate and Rhythm: Normal rate and regular rhythm.     Heart sounds: Normal heart sounds. No murmur heard.  No friction rub.  Pulmonary:     Effort: Pulmonary effort is normal. No respiratory distress.     Breath sounds: Normal breath sounds. No wheezing or rales.  Abdominal:     General: There is no distension.       Palpations: Abdomen is soft.     Tenderness: There is abdominal tenderness (mild, suprapubic).  Musculoskeletal:     Cervical back: Neck supple.  Skin:    General: Skin is warm.     Capillary Refill: Capillary refill takes less than 2 seconds.  Neurological:     Mental Status: He is alert and oriented to person, place, and time.     Motor: No abnormal muscle tone.       ____________________________________________   LABS (all labs ordered are listed, but only abnormal results are displayed)  Labs  Reviewed  CBC WITH DIFFERENTIAL/PLATELET - Abnormal; Notable for the following components:      Result Value   WBC 32.1 (*)    RBC 3.90 (*)    HCT 38.4 (*)    Platelets 125 (*)    Neutro Abs 25.7 (*)    Monocytes Absolute 3.6 (*)    Abs Immature Granulocytes 0.82 (*)    All other components within normal limits  COMPREHENSIVE METABOLIC PANEL - Abnormal; Notable for the following components:   Glucose, Bld 100 (*)    Creatinine, Ser 1.79 (*)    Total Protein 6.4 (*)    Albumin 3.1 (*)    GFR, Estimated 41 (*)    All other components within normal limits  URINALYSIS, COMPLETE (UACMP) WITH MICROSCOPIC - Abnormal; Notable for the following components:   Color, Urine AMBER (*)    APPearance CLOUDY (*)    Hgb urine dipstick MODERATE (*)    Ketones, ur 5 (*)    Protein, ur 100 (*)    Leukocytes,Ua MODERATE (*)    WBC, UA >50 (*)    Bacteria, UA RARE (*)    All other components within normal limits  URINE CULTURE  RESP PANEL BY RT-PCR (FLU A&B, COVID) ARPGX2  CULTURE, BLOOD (ROUTINE X 2)  CULTURE, BLOOD (ROUTINE X 2)  LACTIC ACID, PLASMA  LACTIC ACID, PLASMA    ____________________________________________  EKG: Normal sinus rhythm, ventricular rate 91.  QRS 139, QTc 441.  Baseline artifact due to tremor.  No acute ST elevations or depressions. ________________________________________  RADIOLOGY All imaging, including plain films, CT scans, and ultrasounds,  independently reviewed by me, and interpretations confirmed via formal radiology reads.  ED MD interpretation:   Chest x-ray: Improving aeration compared to prior CT head: No acute abnormality X-ray hip and left foot: Negative  Official radiology report(s): CT Head Wo Contrast  Result Date: 03/11/2020 CLINICAL DATA:  The patient suffered a recent fall. EXAM: CT HEAD WITHOUT CONTRAST CT CERVICAL SPINE WITHOUT CONTRAST TECHNIQUE: Multidetector CT imaging of the head and cervical spine was performed following the standard protocol without intravenous contrast. Multiplanar CT image reconstructions of the cervical spine were also generated. COMPARISON:  Head and cervical spine CT scan 06/21/2018. Head CT 01/23/2020. FINDINGS: CT HEAD FINDINGS Brain: No evidence of acute infarction, hemorrhage, hydrocephalus, extra-axial collection or mass lesion/mass effect. Cortical atrophy again seen. Vascular: No hyperdense vessel or unexpected calcification. Skull: Intact.  No focal lesion. Sinuses/Orbits: Negative. Other: None. CT CERVICAL SPINE FINDINGS Alignment: Maintained. Skull base and vertebrae: No acute fracture. No primary bone lesion or focal pathologic process. Soft tissues and spinal canal: No prevertebral fluid or swelling. No visible canal hematoma. Disc levels: Mild loss of disc space height and bulging C3-4 and C5-6. Upper chest: Negative. Other: None. IMPRESSION: No acute abnormality head or cervical spine. Cortical atrophy. Mild cervical degenerative change. Electronically Signed   By: Drusilla Kanner M.D.   On: 03/11/2020 12:14   CT Cervical Spine Wo Contrast  Result Date: 03/11/2020 CLINICAL DATA:  The patient suffered a recent fall. EXAM: CT HEAD WITHOUT CONTRAST CT CERVICAL SPINE WITHOUT CONTRAST TECHNIQUE: Multidetector CT imaging of the head and cervical spine was performed following the standard protocol without intravenous contrast. Multiplanar CT image reconstructions of the cervical spine  were also generated. COMPARISON:  Head and cervical spine CT scan 06/21/2018. Head CT 01/23/2020. FINDINGS: CT HEAD FINDINGS Brain: No evidence of acute infarction, hemorrhage, hydrocephalus, extra-axial collection or mass lesion/mass effect. Cortical atrophy again seen.  Vascular: No hyperdense vessel or unexpected calcification. Skull: Intact.  No focal lesion. Sinuses/Orbits: Negative. Other: None. CT CERVICAL SPINE FINDINGS Alignment: Maintained. Skull base and vertebrae: No acute fracture. No primary bone lesion or focal pathologic process. Soft tissues and spinal canal: No prevertebral fluid or swelling. No visible canal hematoma. Disc levels: Mild loss of disc space height and bulging C3-4 and C5-6. Upper chest: Negative. Other: None. IMPRESSION: No acute abnormality head or cervical spine. Cortical atrophy. Mild cervical degenerative change. Electronically Signed   By: Drusilla Kannerhomas  Dalessio M.D.   On: 03/11/2020 12:14   DG Chest Portable 1 View  Result Date: 03/11/2020 CLINICAL DATA:  Fall. EXAM: PORTABLE CHEST 1 VIEW COMPARISON:  01/23/2020.  06/21/2018. FINDINGS: Given technique mediastinum appears stable. Heart size stable. Interval clearing of right upper lung infiltrate. Mild infiltrate left upper lung cannot be excluded. Chronic interstitial changes again noted. No pleural effusion or pneumothorax. Degenerative change thoracic spine. No acute bony abnormality identified. IMPRESSION: 1. Interval clearing of right upper lung infiltrate. Mild infiltrate left upper lung cannot be excluded. Chronic interstitial changes again noted. 2.  No acute bony abnormality identified.  No pneumothorax. Electronically Signed   By: Maisie Fushomas  Register   On: 03/11/2020 12:28   DG Foot Complete Left  Result Date: 03/11/2020 CLINICAL DATA:  Left foot pain after a fall this morning. Initial encounter. EXAM: LEFT FOOT - COMPLETE 3+ VIEW COMPARISON:  None. FINDINGS: There is no evidence of fracture or dislocation. There is no  evidence of arthropathy or other focal bone abnormality. Soft tissues are unremarkable. IMPRESSION: Negative exam. Electronically Signed   By: Drusilla Kannerhomas  Dalessio M.D.   On: 03/11/2020 12:24   DG Hip Unilat W or Wo Pelvis 2-3 Views Left  Result Date: 03/11/2020 CLINICAL DATA:  Left hip pain after a fall this morning. Initial encounter. EXAM: DG HIP (WITH OR WITHOUT PELVIS) 2-3V LEFT COMPARISON:  None. FINDINGS: There is no evidence of hip fracture or dislocation. Mild bilateral hip degenerative change is seen. Soft tissues are negative. IMPRESSION: No acute abnormality. Electronically Signed   By: Drusilla Kannerhomas  Dalessio M.D.   On: 03/11/2020 12:26    ____________________________________________  PROCEDURES   Procedure(s) performed (including Critical Care):  Procedures  ____________________________________________  INITIAL IMPRESSION / MDM / ASSESSMENT AND PLAN / ED COURSE  As part of my medical decision making, I reviewed the following data within the electronic MEDICAL RECORD NUMBER Nursing notes reviewed and incorporated, Old chart reviewed, Notes from prior ED visits, and [place] Controlled Substance Database       *Austin BailiffRobin Mamaril was evaluated in Emergency Department on 03/11/2020 for the symptoms described in the history of present illness. He was evaluated in the context of the global COVID-19 pandemic, which necessitated consideration that the patient might be at risk for infection with the SARS-CoV-2 virus that causes COVID-19. Institutional protocols and algorithms that pertain to the evaluation of patients at risk for COVID-19 are in a state of rapid change based on information released by regulatory bodies including the CDC and federal and state organizations. These policies and algorithms were followed during the patient's care in the ED.  Some ED evaluations and interventions may be delayed as a result of limited staffing during the pandemic.*     Medical Decision Making: 64 year old male  here with falls and generalized weakness.  On arrival, patient is afebrile and nontoxic, but does appear clinically dehydrated.  Lab work reveals significant leukocytosis, possible mild AKI, as well as UTI.  This fits  with his urinary frequency and increasing weakness with falls.  Patient was initially given fluids and Rocephin.  This was prior to his white blood cell count returning.  Given the elevated white count, will add on cultures, but clinically do not feel severe sepsis is likely.  He has a normal anion gap and no evidence to suggest significant lactic acidosis.  His blood pressures have been stable.  Will admit.  ____________________________________________  FINAL CLINICAL IMPRESSION(S) / ED DIAGNOSES  Final diagnoses:  Generalized weakness  Acute cystitis with hematuria  Dehydration     MEDICATIONS GIVEN DURING THIS VISIT:  Medications  cefTRIAXone (ROCEPHIN) 2 g in sodium chloride 0.9 % 100 mL IVPB (2 g Intravenous New Bag/Given 03/11/20 1334)  sodium chloride 0.9 % bolus 1,000 mL (has no administration in time range)  sodium chloride 0.9 % bolus 1,000 mL (0 mLs Intravenous Stopped 03/11/20 1334)     ED Discharge Orders    None       Note:  This document was prepared using Dragon voice recognition software and may include unintentional dictation errors.   Shaune Pollack, MD 03/11/20 1400

## 2020-03-11 NOTE — ED Notes (Signed)
Report given to [place], Charity fundraiser.

## 2020-03-11 NOTE — ED Notes (Signed)
Patient transported to X-ray 

## 2020-03-11 NOTE — ED Notes (Signed)
Pt provided with diet tray at this time.

## 2020-03-11 NOTE — ED Notes (Signed)
Pt called out, had already urinated in depend. Urine is dark in amber in color. Pt noted to have rash to bilat groin. Pt states that he wears a depend all the time at the group home, states that he is going more often than normal and it burned.

## 2020-03-11 NOTE — H&P (Signed)
History and Physical    Austin Marsh WUJ:[id number] DOB: 04-04-1956 DOA: 03/11/2020  Referring MD/NP/PA:   PCP: Koren Bound, NP   Patient coming from:  The patient is coming group home.  At baseline, pt is partially dependent for most of ADL.        Chief Complaint: fall and increased urinary frequency  HPI: Austin Marsh is a 64 y.o. male with medical history significant of COPD, hypothyroidism, depression, anxiety, schizophrenia, OSA, tardive dyskinesia, tobacco abuse, who presents with a fall and increased urinary frequency.  Patient states that he has history of UTI and feeling like he needs to urinate more frequently in the past 2 days.  He also has dysuria, no burning on urination.  He has generalized weakness.  Patient states that he has to get up several times overnight to urinate because of increased urinary frequency, and fell several times. No LOC.  No head or neck injury.  Patient denies nausea, vomiting, diarrhea, abdominal pain.  No chest pain, cough, shortness breath.  No fever or chills. He has pain in the left foot and left hip after fall.  ED Course: pt was found to have WBC 32.1, positive urinalysis (cloudy appearance, moderate amount of leukocyte, rare bacteria and WBC> 50), pending COVID-19 PCR, AKI with creatinine 1.79, BUN 20 (creatinine 1.16 on 01/23/2020), temperature normal, blood pressure 101/61, heart rate 100, RR 16, oxygen saturation 94% on room air.  CT head is negative for acute intracranial abnormalities.  CT of C-spine is negative for bony fracture, but showed a degenerative disc disease.  Negative x-ray for bony fracture of left foot and the left hip/pelvis.  Patient is placed on MedSurg bed for observation.  CXR showed:  1. Interval clearing of right upper lung infiltrate. Mild infiltrate left upper lung cannot be excluded. Chronic interstitial changes again noted. 2.  No acute bony abnormality identified.  No pneumothorax.   Review of Systems:    General: no fevers, chills, no body weight gain, has fatigue HEENT: no blurry vision, hearing changes or sore throat Respiratory: no dyspnea, coughing, wheezing CV: no chest pain, no palpitations GI: no nausea, vomiting, abdominal pain, diarrhea, constipation GU: has dysuria, no burning on urination, has increased urinary frequency, no hematuria  Ext: no leg edema Neuro: no unilateral weakness, numbness, or tingling, no vision change or hearing loss. Has fall.  Skin:  no skin tear. MSK: No muscle spasm, no deformity, no limitation of range of movement in spin. Has left foot and left hip pain. Heme: No easy bruising.  Travel history: No recent long distant travel.  Allergy:  Allergies  Allergen Reactions  . Haldol [Haloperidol Lactate] Other (See Comments)    Tardive diskonesia  . Other     [employer] Beans cause patient to vomit  . Thorazine [Chlorpromazine] Other (See Comments)    "messes me all up"  . Trazodone And Nefazodone Other (See Comments)    "makes me feel like I'm dying."    Past Medical History:  Diagnosis Date  . COPD (chronic obstructive pulmonary disease) (HCC)   . Schizoaffective disorder, bipolar type (HCC)   . Sleep apnea   . Tardive dyskinesia   . Thyroid disease     Past Surgical History:  Procedure Laterality Date  . APPENDECTOMY      Social History:  reports that he has been smoking cigarettes. He has never used smokeless tobacco. He reports previous alcohol use. He reports previous drug use.  Family History: tried to have reviewed with  patient, but the patient does not know the detailed information about family medical history.   Prior to Admission medications   Medication Sig Start Date End Date Taking? Authorizing Provider  acetaminophen (TYLENOL) 325 MG tablet Take 650 mg by mouth every 4 (four) hours as needed.    [provider]  albuterol (VENTOLIN HFA) 108 (90 Base) MCG/ACT inhaler Inhale 2 puffs into the lungs every 4 (four) hours  as needed for wheezing or shortness of breath. 01/23/20   Shaune PollackIsaacs, Cameron, MD  alum & mag hydroxide-simeth (MAALOX/MYLANTA) 200-200-20 MG/5ML suspension Take 30 mLs by mouth daily as needed for indigestion or heartburn.    [provider]  benztropine (COGENTIN) 2 MG tablet Take 2 mg by mouth 2 (two) times daily.    [provider]  bismuth subsalicylate (PEPTO BISMOL) 262 MG/15ML suspension Take 15 mLs by mouth every 4 (four) hours as needed.    [provider]  busPIRone (BUSPAR) 15 MG tablet Take 15 mg by mouth 2 (two) times daily.    [provider]  cloZAPine (CLOZARIL) 100 MG tablet Take 100-200 mg by mouth daily. Take 100 mg in the morning and 200 mg at bedtime for scizophrenia    [provider]  divalproex (DEPAKOTE ER) 500 MG 24 hr tablet Take 1,000 mg by mouth at bedtime.    [provider]  guaiFENesin (ROBITUSSIN) 100 MG/5ML liquid Take 200 mg by mouth every 6 (six) hours as needed for cough.    [provider]  levothyroxine (SYNTHROID, LEVOTHROID) 75 MCG tablet Take 75 mcg by mouth daily before breakfast.    [provider]  loperamide (IMODIUM) 2 MG capsule Take 2 mg by mouth as needed for diarrhea or loose stools.    [provider]  magnesium hydroxide (MILK OF MAGNESIA) 400 MG/5ML suspension Take 30 mLs by mouth daily as needed for mild constipation.    [provider]  neomycin-bacitracin-polymyxin (NEOSPORIN) ointment Apply 1 application topically as needed for wound care.    [provider]  omeprazole (PRILOSEC) 40 MG capsule Take 40 mg by mouth daily.     [provider]  polyethylene glycol (MIRALAX / GLYCOLAX) packet Take 17 g by mouth daily.    [provider]  tamsulosin (FLOMAX) 0.4 MG CAPS capsule Take 0.4 mg by mouth 2 (two) times daily.    [provider]  Vitamin D, Cholecalciferol, 25 MCG (1000 UT) TABS Take 25 mcg by mouth daily.    [provider]    Physical Exam: Vitals:   03/11/20 1104 03/11/20 1106 03/11/20 1113 03/11/20 1405  BP:  101/61  112/70  Pulse: 100   90  Resp: 16   15  Temp: 98 F (36.7 C)     TempSrc: Oral     SpO2: 94%  97% 100%  Weight:  81.6 kg    Height:  5\' 9"  (1.753 m)     General: Not in acute distress HEENT:       Eyes: PERRL, EOMI, no scleral icterus.       ENT: No discharge from the ears and nose, no pharynx injection, no tonsillar enlargement.        Neck: No JVD, no bruit, no mass felt. Heme: No neck lymph node enlargement. Cardiac: S1/S2, RRR, No murmurs, No gallops or rubs. Respiratory: No rales, wheezing, rhonchi or rubs. GI: Soft, nondistended, nontender, no rebound pain, no organomegaly, BS present. GU: No hematuria Ext: No pitting leg edema bilaterally.  1+DP/PT pulse bilaterally. Musculoskeletal: No joint deformities, No joint redness or warmth, no limitation of ROM in spin. Skin: No skin tear  Neuro: Alert, oriented X3, cranial nerves II-XII grossly intact, moves all extremities. Psych: Patient is not psychotic, no suicidal or hemocidal ideation.  Labs on Admission: I have personally reviewed following labs and imaging studies  CBC: Recent Labs  Lab 03/11/20 1220  WBC 32.1*  NEUTROABS 25.7*  HGB 13.1  HCT 38.4*  MCV 98.5  PLT 125*   Basic Metabolic Panel: Recent Labs  Lab 03/11/20 1220  NA 138  K 4.2  CL 102  CO2 26  GLUCOSE 100*  BUN 20  CREATININE 1.79*  CALCIUM 9.2   GFR: Estimated Creatinine Clearance: 40.6 mL/min (A) (by C-G formula based on SCr of 1.79 mg/dL (H)). Liver Function Tests: Recent Labs  Lab 03/11/20 1220  AST 18  ALT 8  ALKPHOS 59  BILITOT 1.2  PROT 6.4*  ALBUMIN 3.1*   No results for input(s): LIPASE, AMYLASE in the last 168 hours. No results for input(s): AMMONIA in the last 168 hours. Coagulation Profile: No results for input(s): INR, PROTIME in the last 168 hours. Cardiac Enzymes: No results for input(s): CKTOTAL,  CKMB, CKMBINDEX, TROPONINI in the last 168 hours. BNP (last 3 results) No results for input(s): PROBNP in the last 8760 hours. HbA1C: No results for input(s): HGBA1C in the last 72 hours. CBG: No results for input(s): GLUCAP in the last 168 hours. Lipid Profile: No results for input(s): CHOL, HDL, LDLCALC, TRIG, CHOLHDL, LDLDIRECT in the last 72 hours. Thyroid Function Tests: No results for input(s): TSH, T4TOTAL, FREET4, T3FREE, THYROIDAB in the last 72 hours. Anemia Panel: No results for input(s): VITAMINB12, FOLATE, FERRITIN, TIBC, IRON, RETICCTPCT in the last 72 hours. Urine analysis:    Component Value Date/Time   COLORURINE AMBER (A) 03/11/2020 1220   APPEARANCEUR CLOUDY (A) 03/11/2020 1220   APPEARANCEUR Clear 02/17/2012 1315   LABSPEC 1.020 03/11/2020 1220   LABSPEC 1.018 02/17/2012 1315   PHURINE 5.0 03/11/2020 1220   GLUCOSEU NEGATIVE 03/11/2020 1220   GLUCOSEU Negative 02/17/2012 1315   HGBUR MODERATE (A) 03/11/2020 1220   BILIRUBINUR NEGATIVE 03/11/2020 1220   BILIRUBINUR Negative 02/17/2012 1315   KETONESUR 5 (A) 03/11/2020 1220   PROTEINUR 100 (A) 03/11/2020 1220   UROBILINOGEN 0.2 03/15/2007 2130   NITRITE NEGATIVE 03/11/2020 1220   LEUKOCYTESUR MODERATE (A) 03/11/2020 1220   LEUKOCYTESUR Negative 02/17/2012 1315   Sepsis Labs: @LABRCNTIP (procalcitonin:4,lacticidven:4) ) Recent Results (from the past 240 hour(s))  Resp Panel by RT-PCR (Flu A&B, Covid) Nasopharyngeal Swab     Status: None   Collection Time: 03/11/20  1:35 PM   Specimen: Nasopharyngeal Swab; Nasopharyngeal(NP) swabs in vial transport medium  Result Value Ref Range Status   SARS Coronavirus 2 by RT PCR NEGATIVE NEGATIVE Final    Comment: (NOTE) SARS-CoV-2 target nucleic acids are NOT DETECTED.  The SARS-CoV-2 RNA is generally detectable in upper respiratory specimens during the acute phase of infection. The lowest concentration of SARS-CoV-2 viral copies this assay can detect is 138  copies/mL. A negative result does not preclude SARS-Cov-2 infection and should not be used as the sole basis for treatment or other patient management decisions. A negative result may occur with  improper specimen collection/handling, submission of specimen other than nasopharyngeal swab, presence of viral mutation(s) within the areas targeted by this assay, and inadequate number of viral copies(<138 copies/mL). A negative result must be combined with clinical observations, patient history, and  epidemiological information. The expected result is Negative.  Fact Sheet for Patients:  BloggerCourse.com  Fact Sheet for Healthcare Providers:  SeriousBroker.it  This test is no t yet approved or cleared by the [place] FDA and  has been authorized for detection and/or diagnosis of SARS-CoV-2 by FDA under an Emergency Use Authorization (EUA). This EUA will remain  in effect (meaning this test can be used) for the duration of the COVID-19 declaration under Section 564(b)(1) of the Act, 21 U.S.C.section 360bbb-3(b)(1), unless the authorization is terminated  or revoked sooner.       Influenza A by PCR NEGATIVE NEGATIVE Final   Influenza B by PCR NEGATIVE NEGATIVE Final    Comment: (NOTE) The Xpert Xpress SARS-CoV-2/FLU/RSV plus assay is intended as an aid in the diagnosis of influenza from Nasopharyngeal swab specimens and should not be used as a sole basis for treatment. Nasal washings and aspirates are unacceptable for Xpert Xpress SARS-CoV-2/FLU/RSV testing.  Fact Sheet for Patients: BloggerCourse.com  Fact Sheet for Healthcare Providers: SeriousBroker.it  This test is not yet approved or cleared by the [place] FDA and has been authorized for detection and/or diagnosis of SARS-CoV-2 by FDA under an Emergency Use Authorization (EUA). This EUA will remain in effect (meaning  this test can be used) for the duration of the COVID-19 declaration under Section 564(b)(1) of the Act, 21 U.S.C. section 360bbb-3(b)(1), unless the authorization is terminated or revoked.  Performed at [facility], [address], [place], [place] [address]      Radiological Exams on Admission: CT Head Wo Contrast  Result Date: 03/11/2020 CLINICAL DATA:  The patient suffered a recent fall. EXAM: CT HEAD WITHOUT CONTRAST CT CERVICAL SPINE WITHOUT CONTRAST TECHNIQUE: Multidetector CT imaging of the head and cervical spine was performed following the standard protocol without intravenous contrast. Multiplanar CT image reconstructions of the cervical spine were also generated. COMPARISON:  Head and cervical spine CT scan 06/21/2018. Head CT 01/23/2020. FINDINGS: CT HEAD FINDINGS Brain: No evidence of acute infarction, hemorrhage, hydrocephalus, extra-axial collection or mass lesion/mass effect. Cortical atrophy again seen. Vascular: No hyperdense vessel or unexpected calcification. Skull: Intact.  No focal lesion. Sinuses/Orbits: Negative. Other: None. CT CERVICAL SPINE FINDINGS Alignment: Maintained. Skull base and vertebrae: No acute fracture. No primary bone lesion or focal pathologic process. Soft tissues and spinal canal: No prevertebral fluid or swelling. No visible canal hematoma. Disc levels: Mild loss of disc space height and bulging C3-4 and C5-6. Upper chest: Negative. Other: None. IMPRESSION: No acute abnormality head or cervical spine. Cortical atrophy. Mild cervical degenerative change. Electronically Signed   By: Drusilla Kanner M.D.   On: 03/11/2020 12:14   CT Cervical Spine Wo Contrast  Result Date: 03/11/2020 CLINICAL DATA:  The patient suffered a recent fall. EXAM: CT HEAD WITHOUT CONTRAST CT CERVICAL SPINE WITHOUT CONTRAST TECHNIQUE: Multidetector CT imaging of the head and cervical spine was performed following the standard protocol without intravenous contrast.  Multiplanar CT image reconstructions of the cervical spine were also generated. COMPARISON:  Head and cervical spine CT scan 06/21/2018. Head CT 01/23/2020. FINDINGS: CT HEAD FINDINGS Brain: No evidence of acute infarction, hemorrhage, hydrocephalus, extra-axial collection or mass lesion/mass effect. Cortical atrophy again seen. Vascular: No hyperdense vessel or unexpected calcification. Skull: Intact.  No focal lesion. Sinuses/Orbits: Negative. Other: None. CT CERVICAL SPINE FINDINGS Alignment: Maintained. Skull base and vertebrae: No acute fracture. No primary bone lesion or focal pathologic process. Soft tissues and spinal canal: No prevertebral fluid or swelling. No  visible canal hematoma. Disc levels: Mild loss of disc space height and bulging C3-4 and C5-6. Upper chest: Negative. Other: None. IMPRESSION: No acute abnormality head or cervical spine. Cortical atrophy. Mild cervical degenerative change. Electronically Signed   By: Drusilla Kanner M.D.   On: 03/11/2020 12:14   DG Chest Portable 1 View  Result Date: 03/11/2020 CLINICAL DATA:  Fall. EXAM: PORTABLE CHEST 1 VIEW COMPARISON:  01/23/2020.  06/21/2018. FINDINGS: Given technique mediastinum appears stable. Heart size stable. Interval clearing of right upper lung infiltrate. Mild infiltrate left upper lung cannot be excluded. Chronic interstitial changes again noted. No pleural effusion or pneumothorax. Degenerative change thoracic spine. No acute bony abnormality identified. IMPRESSION: 1. Interval clearing of right upper lung infiltrate. Mild infiltrate left upper lung cannot be excluded. Chronic interstitial changes again noted. 2.  No acute bony abnormality identified.  No pneumothorax. Electronically Signed   By: Maisie Fus  Register   On: 03/11/2020 12:28   DG Foot Complete Left  Result Date: 03/11/2020 CLINICAL DATA:  Left foot pain after a fall this morning. Initial encounter. EXAM: LEFT FOOT - COMPLETE 3+ VIEW COMPARISON:  None. FINDINGS:  There is no evidence of fracture or dislocation. There is no evidence of arthropathy or other focal bone abnormality. Soft tissues are unremarkable. IMPRESSION: Negative exam. Electronically Signed   By: Drusilla Kanner M.D.   On: 03/11/2020 12:24   DG Hip Unilat W or Wo Pelvis 2-3 Views Left  Result Date: 03/11/2020 CLINICAL DATA:  Left hip pain after a fall this morning. Initial encounter. EXAM: DG HIP (WITH OR WITHOUT PELVIS) 2-3V LEFT COMPARISON:  None. FINDINGS: There is no evidence of hip fracture or dislocation. Mild bilateral hip degenerative change is seen. Soft tissues are negative. IMPRESSION: No acute abnormality. Electronically Signed   By: Drusilla Kanner M.D.   On: 03/11/2020 12:26     EKG: I have personally reviewed.  QTC 441, tachycardia, poor quality of EKG strips  Assessment/Plan Principal Problem:   UTI (urinary tract infection) Active Problems:   AKI (acute kidney injury) (HCC)   Sepsis (HCC)   COPD (chronic obstructive pulmonary disease) (HCC)   Hypothyroidism   Depression with anxiety   Schizoaffective disorder, bipolar type (HCC)   Tobacco abuse   Fall   Sepsis due to UTI (urinary tract infection): Patient meets criteria for sepsis with leukocytosis with WBC 32.1 and tachycardia with heart rate of 100 on admission.  Currently hemodynamically stable.  -  Place on med-surg bed for obs -  Ceftriaxone by IV - Follow up results of urine and blood cx and amend antibiotic regimen if needed per sensitivity results - prn Zofran for nausea - will get Procalcitonin and trend lactic acid levels per sepsis protocol. - IVF: 2L of NS bolus in ED, followed by 100 cc/h  AKI (acute kidney injury) (HCC): like due to UTI - IVF as above - Follow up renal function by BMP - Avoid using renal toxic medications, hypotension and contrast dye  Fall: No acute injury and all images negative.  Likely due to sepsis and UTI -PT/OT  COPD (chronic obstructive pulmonary disease)  (HCC) -Bronchodilators  Hypothyroidism -Synthroid  Depression with anxiety and schizoaffective disorder, bipolar type (HCC): No SI or HI. -continue home Cogentin, BuSpar, clozapine, Depakote  Tobacco abuse -Nicotine patch         DVT ppx: SQ Lovenox Code Status: Full code Family Communication:    Yes, patient's care giver and the director of group home by phone Disposition  Plan:  Anticipate discharge back to previous group environment Consults called: None Admission status: Med-surg bed for obs   Status is: Observation  The patient remains OBS appropriate and will d/c before 2 midnights.  Dispo: The patient is from: Group home              Anticipated d/c is to: Group home              Anticipated d/c date is: 1 day              Patient currently is not medically stable to d/c.          Date of Service 03/11/2020    Lorretta Harp [facility]   If 7PM-7AM, please contact night-coverage [email or web address] 03/11/2020, 3:40 PM

## 2020-03-12 DIAGNOSIS — F250 Schizoaffective disorder, bipolar type: Secondary | ICD-10-CM | POA: Diagnosis not present

## 2020-03-12 DIAGNOSIS — N3000 Acute cystitis without hematuria: Secondary | ICD-10-CM | POA: Diagnosis not present

## 2020-03-12 DIAGNOSIS — R531 Weakness: Secondary | ICD-10-CM

## 2020-03-12 LAB — CBC
HCT: 35.1 % — ABNORMAL LOW (ref 39.0–52.0)
Hemoglobin: 11.7 g/dL — ABNORMAL LOW (ref 13.0–17.0)
MCH: 33 pg (ref 26.0–34.0)
MCHC: 33.3 g/dL (ref 30.0–36.0)
MCV: 98.9 fL (ref 80.0–100.0)
Platelets: 107 10*3/uL — ABNORMAL LOW (ref 150–400)
RBC: 3.55 MIL/uL — ABNORMAL LOW (ref 4.22–5.81)
RDW: 15 % (ref 11.5–15.5)
WBC: 28.9 10*3/uL — ABNORMAL HIGH (ref 4.0–10.5)
nRBC: 0 % (ref 0.0–0.2)

## 2020-03-12 LAB — BASIC METABOLIC PANEL
Anion gap: 11 (ref 5–15)
BUN: 18 mg/dL (ref 8–23)
CO2: 22 mmol/L (ref 22–32)
Calcium: 8.3 mg/dL — ABNORMAL LOW (ref 8.9–10.3)
Chloride: 107 mmol/L (ref 98–111)
Creatinine, Ser: 1.24 mg/dL (ref 0.61–1.24)
GFR, Estimated: >60 mL/min (ref >60)
Glucose, Bld: 99 mg/dL (ref 70–99)
Potassium: 3.6 mmol/L (ref 3.5–5.1)
Sodium: 140 mmol/L (ref 135–145)

## 2020-03-12 LAB — HIV ANTIBODY (ROUTINE TESTING W REFLEX): HIV Screen 4th Generation wRfx: NONREACTIVE

## 2020-03-12 MED ORDER — SODIUM CHLORIDE 0.9 % IV SOLN
INTRAVENOUS | Status: DC | PRN
  Administered 2020-03-12: 250 mL via INTRAVENOUS

## 2020-03-12 NOTE — Care Management (Signed)
RE: Austin Marsh Date of Birth:2055/10/10 Date: 03/12/20  To Whom It May Concern:  Please be advised that the above-named patient will require a short-term nursing home stay - anticipated 30 days or less for rehabilitation and strengthening.  The plan is for return home.

## 2020-03-12 NOTE — TOC Initial Note (Signed)
Transition of Care [facility]) - Initial/Assessment Note    Patient Details  Name: Austin Marsh MRN: [id number] Date of Birth: [birth year]  [facility] CM/SW Contact:    Chapman Fitch, RN Phone Number: 03/12/2020, 1:39 PM  Clinical Narrative:                 Patient admitted from [facility] Patient A&O x3  PCP [facility]  Pharmacy [facility] Patient states his MD comes to the family care home to see him.  Denies issues obtaining medications Patient states that he has lived at the family care home fr 3 years and wishes to return.    Patient states that he ambulated with a RW and cane in the home.    Patient requests that I speak with group home owner [facility].  Doristine Mango confirms that patient can return pending PT eval.  If home health is recommended his preference of agency is [facility] or [facility].  Doristine Mango states that he can pick up the patient today or tomorrow if he is ready for discharge.  Tomorrow would have to be after 1  Update:  PT has assessed patient and recommended SNF.  Patient is not able to ambulate at this time.  Patient agreeable to SNF.  Requests that I update [facility].  VM left for [place] [facility].  Awaiting return call   Pasrr went to level 2.  Additional clinical sent Fl2 sent for signature Bed search initiated Patient currently observation status and would require medicare waiver    Expected Discharge Plan: Skilled Nursing Facility     Patient Goals and CMS Choice     Choice offered to / list presented to : Patient  Expected Discharge Plan and Services Expected Discharge Plan: Skilled Nursing Facility       Living arrangements for the past 2 months: Group Home                                      Prior Living Arrangements/Services Living arrangements for the past 2 months: Group Home Lives with:: Facility Resident Patient language and need for interpreter reviewed:: Yes Do you feel safe going back to the place  where you live?: Yes        Care giver support system in place?: Yes (comment) Current home services: DME Criminal Activity/Legal Involvement Pertinent to Current Situation/Hospitalization: No - Comment as needed  Activities of Daily Living Home Assistive Devices/Equipment: Dan Humphreys (specify type) ADL Screening (condition at time of admission) Patient's cognitive ability adequate to safely complete daily activities?: Yes Is the patient deaf or have difficulty hearing?: No Does the patient have difficulty seeing, even when wearing glasses/contacts?: No Does the patient have difficulty concentrating, remembering, or making decisions?: Yes Patient able to express need for assistance with ADLs?: Yes Does the patient have difficulty dressing or bathing?: Yes Independently performs ADLs?: No Communication: Independent Dressing (OT): Needs assistance Is this a change from baseline?: Pre-admission baseline Grooming: Needs assistance Is this a change from baseline?: Pre-admission baseline Feeding: Independent Bathing: Needs assistance Is this a change from baseline?: Pre-admission baseline Toileting: Independent In/Out Bed: Independent Walks in Home: Independent with device (comment) Is this a change from baseline?: Pre-admission baseline Does the patient have difficulty walking or climbing stairs?: Yes Weakness of Legs: Both Weakness of Arms/Hands: Both  Permission Sought/Granted  Emotional Assessment Appearance:: Appears stated age     Orientation: : Oriented to Self, Oriented to Place, Oriented to Situation   Psych Involvement: No (comment)  Admission diagnosis:  Dehydration [E86.0] UTI (urinary tract infection) [N39.0] Fall [W19.XXXA] Acute cystitis with hematuria [N30.01] Generalized weakness [R53.1] Fall, initial encounter [W19.XXXA] Patient Active Problem List   Diagnosis Date Noted  . UTI (urinary tract infection) 03/11/2020  . AKI (acute kidney  injury) (HCC) 03/11/2020  . Sepsis (HCC) 03/11/2020  . COPD (chronic obstructive pulmonary disease) (HCC)   . Hypothyroidism   . Depression with anxiety   . Schizoaffective disorder, bipolar type (HCC)   . Tobacco abuse   . Fall   . Scrotal pain 06/26/2018   PCP:  Koren Bound, NP Pharmacy:   [facility] [place], [place] - [address] [place] [address] Phone: [phone] Fax: [phone]     Social Determinants of Health (SDOH) Interventions    Readmission Risk Interventions No flowsheet data found.

## 2020-03-12 NOTE — Evaluation (Signed)
Physical Therapy Evaluation Patient Details Name: Austin Marsh MRN: [id number] DOB: March 30, 1956 Today's Date: 03/12/2020   History of Present Illness  presented to ER secondary to fall, increasing urinary frequency; sepsis secondary to UTI.  Clinical Impression  Upon evaluation, patient alert and oriented to self only; follows simple commands approx 50% time. Marked difficulty attending to conversation, maintaining focus on task. Very highly distractible and disorganized; limited insight into deficits and current situation.  Bilat UE/LE generally weak and deconditioned; significant tremors in both open- and closed-chain activities.  Very high risk for buckling, LOB and fall.  Does report some degree of dizziness with transition to upright; suspect potential orthostasis, but patient unable to maintain standing position for full assessment. Will continue to assess in subsequent sessions (seated BP 100/55). Currently requiring mod assist for bed mobility; mod assist for sit/stand and brief static standing balance with RW.  Very weak and unsteady; unsafe to attempt standing/stepping at this time.  Was able to complete OOB to chair via squat pivot, mod assist; recommend this method for return to bed (CNA informed/aware). Would benefit from skilled PT to address above deficits and promote optimal return to PLOF.; recommend transition to STR upon discharge from acute hospitalization.     Follow Up Recommendations SNF    Equipment Recommendations  Rolling walker with 5" wheels    Recommendations for Other Services       Precautions / Restrictions Precautions Precautions: Fall Restrictions Weight Bearing Restrictions: No      Mobility  Bed Mobility Overal bed mobility: Needs Assistance Bed Mobility: Supine to Sit     Supine to sit: Mod assist     General bed mobility comments: hand-over-hand assist for UE/LE positioning, initiation of task; extensive assist for truncal elevation     Transfers Overall transfer level: Needs assistance Equipment used: Rolling walker (2 wheeled) Transfers: Sit to/from Stand Sit to Stand: Mod assist         General transfer comment: very tremulous, poor balance (increased posterior lean/weight shift); very high risk for buckling, fall  Ambulation/Gait             General Gait Details: unsafe/unable  Stairs            Wheelchair Mobility    Modified Rankin (Stroke Patients Only)       Balance Overall balance assessment: Needs assistance Sitting-balance support: No upper extremity supported;Feet supported Sitting balance-Leahy Scale: Fair     Standing balance support: Bilateral upper extremity supported Standing balance-Leahy Scale: Zero                               Pertinent Vitals/Pain Pain Assessment: Faces Faces Pain Scale: Hurts little more Pain Location: R hip/LE, L foot Pain Descriptors / Indicators: Aching;Guarding;Grimacing Pain Intervention(s): Limited activity within patient's tolerance;Monitored during session;Repositioned    Home Living Family/patient expects to be discharged to:: Group home Living Arrangements: Group Home               Additional Comments: Single story, no steps per patient report    Prior Function Level of Independence: Needs assistance         Comments: Sup/mod indep with RW for household mobilization; does endorse multiple fall history     Hand Dominance        Extremity/Trunk Assessment   Upper Extremity Assessment Upper Extremity Assessment: Generalized weakness    Lower Extremity Assessment Lower Extremity Assessment: Generalized weakness (grossly 3+  to 4-/5 throughout; globally weak.  Very tremulous in both open and closed-chain activities)       Communication   Communication: No difficulties (difficulty maintaining focus, topic of conversation)  Cognition Arousal/Alertness: Awake/alert Behavior During Therapy: WFL for tasks  assessed/performed Overall Cognitive Status: Within Functional Limits for tasks assessed                                 General Comments: very internally/externally distractible; difficulty maintaining focus on task, topic of conversation.  Generally confused with poor insight into current situation.      General Comments      Exercises Other Exercises Other Exercises: rolling bilat, mod assist to initiate/complete; hand-over-hand assist for UE/LE placement.  Patient very fearful of any functional movement, afraid of falling Other Exercises: Sit/stand x2 with RW, mod assist-unable to maintain greater than 3-4 seconds each trial, spontaneously sitting with each attempt.  Very tremulous, unsteady and afraid.  Does report some degree of dizziness with transition to upright.  Unable to maintain standing position for duration adequate for BP measurement. Other Exercises: Lateral scoot/squat pivot to chair, min/mod assist.  Fair clearance of buttocks from surface with initial scoot; difficulty changing hand placement and manipulating movement for full adjustment and completion of transfer.   Assessment/Plan    PT Assessment Patient needs continued PT services  PT Problem List Decreased strength;Decreased activity tolerance;Decreased balance;Decreased range of motion;Decreased mobility;Decreased knowledge of use of DME;Decreased safety awareness;Decreased knowledge of precautions;Decreased coordination;Decreased cognition;Pain       PT Treatment Interventions DME instruction;Gait training;Functional mobility training;Therapeutic activities;Therapeutic exercise;Balance training;Patient/family education;Cognitive remediation    PT Goals (Current goals can be found in the Care Plan section)  Acute Rehab PT Goals Patient Stated Goal: to get some warm blankets PT Goal Formulation: With patient Time For Goal Achievement: 03/26/20 Potential to Achieve Goals: Fair    Frequency Min  2X/week   Barriers to discharge        Co-evaluation               AM-PAC PT "6 Clicks" Mobility  Outcome Measure Help needed turning from your back to your side while in a flat bed without using bedrails?: A Lot Help needed moving from lying on your back to sitting on the side of a flat bed without using bedrails?: A Lot Help needed moving to and from a bed to a chair (including a wheelchair)?: A Lot Help needed standing up from a chair using your arms (e.g., wheelchair or bedside chair)?: A Lot Help needed to walk in hospital room?: Total Help needed climbing 3-5 steps with a railing? : Total 6 Click Score: 10    End of Session Equipment Utilized During Treatment: Gait belt Activity Tolerance: Patient tolerated treatment well Patient left: in chair;with call bell/phone within reach;with chair alarm set Nurse Communication: Mobility status PT Visit Diagnosis: Muscle weakness (generalized) (M62.81);Difficulty in walking, not elsewhere classified (R26.2);Repeated falls (R29.6)    Time: 2595-6387 PT Time Calculation (min) (ACUTE ONLY): 40 min   Charges:   PT Evaluation $PT Eval Moderate Complexity: 1 Mod PT Treatments $Therapeutic Activity: 23-37 mins       Floreine Kingdon H. Manson Passey, PT, DPT, NCS 03/12/20, 12:22 PM [phone]

## 2020-03-12 NOTE — Care Management Obs Status (Signed)
MEDICARE OBSERVATION STATUS NOTIFICATION   Patient Details  Name: Austin Marsh MRN: [id number] Date of Birth: 01/23/1956   Medicare Observation Status Notification Given:  Yes    Chapman Fitch, RN 03/12/2020, 10:54 AM

## 2020-03-12 NOTE — Progress Notes (Addendum)
PROGRESS NOTE    Austin Marsh  MOQ:[id number] DOB: [birth year] DOA: 03/11/2020 PCP: Koren Bound, NP   Assessment & Plan:   Principal Problem:   UTI (urinary tract infection) Active Problems:   AKI (acute kidney injury) (HCC)   Sepsis (HCC)   COPD (chronic obstructive pulmonary disease) (HCC)   Hypothyroidism   Depression with anxiety   Schizoaffective disorder, bipolar type (HCC)   Tobacco abuse   Fall   Sepsis: secondary to UTI. Present on admission & meets criteria with leukocytosis, tachycardia. Continue on IVFs, IV abxs. Blood cxs NGTD   UTI: UA is positive. Urine cx is pending. Continue on IV ceftriaxone   Leukocytosis: likely secondary to infection. Continue on IV abxs   AKI: likely secondary to UTI. Continue on IVFs. Will continue to monitor   Generalized weakness: PT recs SNF   COPD: w/o exacerbation. Continue on bronchodilators & encourage incentive spirometry  Hypothyroidism: continue on home dose of synthroid   Depression: severity unknown. Continue on home dose of buspar.  Schizoaffective disorder, bipolar type : No SI or HI. Unknown type or severity. Continue on home dose of  Clozapine, cogentin, clozapine, depakote   Tobacco abuse: smoking cessation counseling. Nicotine patch to prevent w/drawal     DVT prophylaxis: lovenox  Code Status: full  Family Communication: Disposition Plan: likely d/c to SNF   Status is: Observation  The patient will require care spanning > 2 midnights and should be moved to inpatient because: Ongoing diagnostic testing needed not appropriate for outpatient work up and IV treatments appropriate due to intensity of illness or inability to take PO  Dispo: The patient is from: Home              Anticipated d/c is to: SNF              Anticipated d/c date is: 2 days              Patient currently is not medically stable to d/c.    Consultants:     Procedures:    Antimicrobials:  ceftriaxone   Subjective: Pt c/o fatigue  Objective: Vitals:   03/11/20 1630 03/11/20 1956 03/11/20 2348 03/12/20 0501  BP: (!) 114/57 111/64 107/61 (!) 97/50  Pulse: 91 89 95 82  Resp: 18 17 16 20   Temp: 98.9 F (37.2 C) 98.3 F (36.8 C) 98.5 F (36.9 C) 98.5 F (36.9 C)  TempSrc: Oral Oral Oral   SpO2: 99% 100% 95% 95%  Weight:      Height:        Intake/Output Summary (Last 24 hours) at 03/12/2020 03/14/2020 Last data filed at 03/12/2020 0700 Gross per 24 hour  Intake 830.38 ml  Output 150 ml  Net 680.38 ml   Filed Weights   03/11/20 1106  Weight: 81.6 kg    Examination:  General exam: Appears calm and comfortable  Respiratory system: Clear to auscultation. Respiratory effort normal. Cardiovascular system: S1 & S2 +. No rubs, gallops or clicks.  Gastrointestinal system: Abdomen is nondistended, soft and nontender. Hypoactive bowel sounds heard. Central nervous system: Alert and oriented. Moves all 4 extremities  Psychiatry: Judgement and insight appear normal. Flat mood and affect.     Data Reviewed: I have personally reviewed following labs and imaging studies  CBC: Recent Labs  Lab 03/11/20 1220 03/12/20 0512  WBC 32.1* 28.9*  NEUTROABS 25.7*  --   HGB 13.1 11.7*  HCT 38.4* 35.1*  MCV 98.5 98.9  PLT 125* 107*  Basic Metabolic Panel: Recent Labs  Lab 03/11/20 1220  NA 138  K 4.2  CL 102  CO2 26  GLUCOSE 100*  BUN 20  CREATININE 1.79*  CALCIUM 9.2   GFR: Estimated Creatinine Clearance: 40.6 mL/min (A) (by C-G formula based on SCr of 1.79 mg/dL (H)). Liver Function Tests: Recent Labs  Lab 03/11/20 1220  AST 18  ALT 8  ALKPHOS 59  BILITOT 1.2  PROT 6.4*  ALBUMIN 3.1*   No results for input(s): LIPASE, AMYLASE in the last 168 hours. No results for input(s): AMMONIA in the last 168 hours. Coagulation Profile: No results for input(s): INR, PROTIME in the last 168 hours. Cardiac Enzymes: No results for input(s): CKTOTAL, CKMB,  CKMBINDEX, TROPONINI in the last 168 hours. BNP (last 3 results) No results for input(s): PROBNP in the last 8760 hours. HbA1C: No results for input(s): HGBA1C in the last 72 hours. CBG: No results for input(s): GLUCAP in the last 168 hours. Lipid Profile: No results for input(s): CHOL, HDL, LDLCALC, TRIG, CHOLHDL, LDLDIRECT in the last 72 hours. Thyroid Function Tests: No results for input(s): TSH, T4TOTAL, FREET4, T3FREE, THYROIDAB in the last 72 hours. Anemia Panel: No results for input(s): VITAMINB12, FOLATE, FERRITIN, TIBC, IRON, RETICCTPCT in the last 72 hours. Sepsis Labs: Recent Labs  Lab 03/11/20 1412  PROCALCITON 0.30  LATICACIDVEN 1.3    Recent Results (from the past 240 hour(s))  Resp Panel by RT-PCR (Flu A&B, Covid) Nasopharyngeal Swab     Status: None   Collection Time: 03/11/20  1:35 PM   Specimen: Nasopharyngeal Swab; Nasopharyngeal(NP) swabs in vial transport medium  Result Value Ref Range Status   SARS Coronavirus 2 by RT PCR NEGATIVE NEGATIVE Final    Comment: (NOTE) SARS-CoV-2 target nucleic acids are NOT DETECTED.  The SARS-CoV-2 RNA is generally detectable in upper respiratory specimens during the acute phase of infection. The lowest concentration of SARS-CoV-2 viral copies this assay can detect is 138 copies/mL. A negative result does not preclude SARS-Cov-2 infection and should not be used as the sole basis for treatment or other patient management decisions. A negative result may occur with  improper specimen collection/handling, submission of specimen other than nasopharyngeal swab, presence of viral mutation(s) within the areas targeted by this assay, and inadequate number of viral copies(<138 copies/mL). A negative result must be combined with clinical observations, patient history, and epidemiological information. The expected result is Negative.  Fact Sheet for Patients:  BloggerCourse.com  Fact Sheet for Healthcare  Providers:  SeriousBroker.it  This test is no t yet approved or cleared by the [place] FDA and  has been authorized for detection and/or diagnosis of SARS-CoV-2 by FDA under an Emergency Use Authorization (EUA). This EUA will remain  in effect (meaning this test can be used) for the duration of the COVID-19 declaration under Section 564(b)(1) of the Act, 21 U.S.C.section 360bbb-3(b)(1), unless the authorization is terminated  or revoked sooner.       Influenza A by PCR NEGATIVE NEGATIVE Final   Influenza B by PCR NEGATIVE NEGATIVE Final    Comment: (NOTE) The Xpert Xpress SARS-CoV-2/FLU/RSV plus assay is intended as an aid in the diagnosis of influenza from Nasopharyngeal swab specimens and should not be used as a sole basis for treatment. Nasal washings and aspirates are unacceptable for Xpert Xpress SARS-CoV-2/FLU/RSV testing.  Fact Sheet for Patients: BloggerCourse.com  Fact Sheet for Healthcare Providers: SeriousBroker.it  This test is not yet approved or cleared by the [place] and  has been authorized for detection and/or diagnosis of SARS-CoV-2 by FDA under an Emergency Use Authorization (EUA). This EUA will remain in effect (meaning this test can be used) for the duration of the COVID-19 declaration under Section 564(b)(1) of the Act, 21 U.S.C. section 360bbb-3(b)(1), unless the authorization is terminated or revoked.  Performed at [facility], [address], [place], [place] [address]          Radiology Studies: CT Head Wo Contrast  Result Date: 03/11/2020 CLINICAL DATA:  The patient suffered a recent fall. EXAM: CT HEAD WITHOUT CONTRAST CT CERVICAL SPINE WITHOUT CONTRAST TECHNIQUE: Multidetector CT imaging of the head and cervical spine was performed following the standard protocol without intravenous contrast. Multiplanar CT image reconstructions of the  cervical spine were also generated. COMPARISON:  Head and cervical spine CT scan 06/21/2018. Head CT 01/23/2020. FINDINGS: CT HEAD FINDINGS Brain: No evidence of acute infarction, hemorrhage, hydrocephalus, extra-axial collection or mass lesion/mass effect. Cortical atrophy again seen. Vascular: No hyperdense vessel or unexpected calcification. Skull: Intact.  No focal lesion. Sinuses/Orbits: Negative. Other: None. CT CERVICAL SPINE FINDINGS Alignment: Maintained. Skull base and vertebrae: No acute fracture. No primary bone lesion or focal pathologic process. Soft tissues and spinal canal: No prevertebral fluid or swelling. No visible canal hematoma. Disc levels: Mild loss of disc space height and bulging C3-4 and C5-6. Upper chest: Negative. Other: None. IMPRESSION: No acute abnormality head or cervical spine. Cortical atrophy. Mild cervical degenerative change. Electronically Signed   By: Drusilla Kanner M.D.   On: 03/11/2020 12:14   CT Cervical Spine Wo Contrast  Result Date: 03/11/2020 CLINICAL DATA:  The patient suffered a recent fall. EXAM: CT HEAD WITHOUT CONTRAST CT CERVICAL SPINE WITHOUT CONTRAST TECHNIQUE: Multidetector CT imaging of the head and cervical spine was performed following the standard protocol without intravenous contrast. Multiplanar CT image reconstructions of the cervical spine were also generated. COMPARISON:  Head and cervical spine CT scan 06/21/2018. Head CT 01/23/2020. FINDINGS: CT HEAD FINDINGS Brain: No evidence of acute infarction, hemorrhage, hydrocephalus, extra-axial collection or mass lesion/mass effect. Cortical atrophy again seen. Vascular: No hyperdense vessel or unexpected calcification. Skull: Intact.  No focal lesion. Sinuses/Orbits: Negative. Other: None. CT CERVICAL SPINE FINDINGS Alignment: Maintained. Skull base and vertebrae: No acute fracture. No primary bone lesion or focal pathologic process. Soft tissues and spinal canal: No prevertebral fluid or swelling.  No visible canal hematoma. Disc levels: Mild loss of disc space height and bulging C3-4 and C5-6. Upper chest: Negative. Other: None. IMPRESSION: No acute abnormality head or cervical spine. Cortical atrophy. Mild cervical degenerative change. Electronically Signed   By: Drusilla Kanner M.D.   On: 03/11/2020 12:14   DG Chest Portable 1 View  Result Date: 03/11/2020 CLINICAL DATA:  Fall. EXAM: PORTABLE CHEST 1 VIEW COMPARISON:  01/23/2020.  06/21/2018. FINDINGS: Given technique mediastinum appears stable. Heart size stable. Interval clearing of right upper lung infiltrate. Mild infiltrate left upper lung cannot be excluded. Chronic interstitial changes again noted. No pleural effusion or pneumothorax. Degenerative change thoracic spine. No acute bony abnormality identified. IMPRESSION: 1. Interval clearing of right upper lung infiltrate. Mild infiltrate left upper lung cannot be excluded. Chronic interstitial changes again noted. 2.  No acute bony abnormality identified.  No pneumothorax. Electronically Signed   By: Maisie Fus  Register   On: 03/11/2020 12:28   DG Foot Complete Left  Result Date: 03/11/2020 CLINICAL DATA:  Left foot pain after a fall this morning. Initial encounter. EXAM: LEFT FOOT -  COMPLETE 3+ VIEW COMPARISON:  None. FINDINGS: There is no evidence of fracture or dislocation. There is no evidence of arthropathy or other focal bone abnormality. Soft tissues are unremarkable. IMPRESSION: Negative exam. Electronically Signed   By: Drusilla Kannerhomas  Dalessio M.D.   On: 03/11/2020 12:24   DG Hip Unilat W or Wo Pelvis 2-3 Views Left  Result Date: 03/11/2020 CLINICAL DATA:  Left hip pain after a fall this morning. Initial encounter. EXAM: DG HIP (WITH OR WITHOUT PELVIS) 2-3V LEFT COMPARISON:  None. FINDINGS: There is no evidence of hip fracture or dislocation. Mild bilateral hip degenerative change is seen. Soft tissues are negative. IMPRESSION: No acute abnormality. Electronically Signed   By: Drusilla Kannerhomas   Dalessio M.D.   On: 03/11/2020 12:26        Scheduled Meds: . benztropine  2 mg Oral BID  . busPIRone  15 mg Oral BID  . cholecalciferol  2,000 Units Oral Daily  . cloZAPine  100 mg Oral Daily  . cloZAPine  200 mg Oral QHS  . divalproex  1,000 mg Oral QHS  . enoxaparin (LOVENOX) injection  40 mg Subcutaneous Q24H  . gabapentin  200 mg Oral QHS  . levothyroxine  75 mcg Oral QAC breakfast  . nicotine  21 mg Transdermal Daily  . pantoprazole  40 mg Oral Daily  . tamsulosin  0.4 mg Oral BID  . vitamin B-12  100 mcg Oral Daily   Continuous Infusions: . sodium chloride 100 mL/hr at 03/12/20 0528  . cefTRIAXone (ROCEPHIN)  IV       LOS: 0 days    Time spent: 30 mins    Charise KillianJamiese M Hailee Hollick, MD [facility] Pager [phone]  If 7PM-7AM, please contact night-coverage 03/12/2020, 8:12 AM

## 2020-03-12 NOTE — TOC Progression Note (Signed)
Transition of Care [facility]) - Progression Note    Patient Details  Name: Austin Marsh MRN: [id number] Date of Birth: 1955-12-05  [facility]) CM/SW Contact  Chapman Fitch, RN Phone Number: 03/12/2020, 3:01 PM  Clinical Narrative:    Bed offers presented.  Patient accepted bed [facility] is aware that patient will potentially discharge tomorrow and would require 3 day medicare waiver.   Clement updated.  States that he will provide [facility] with a copy of the patient's covid card   Expected Discharge Plan: Skilled Nursing Facility    Expected Discharge Plan and Services Expected Discharge Plan: Skilled Nursing Facility       Living arrangements for the past 2 months: Group Home                                       Social Determinants of Health (SDOH) Interventions    Readmission Risk Interventions No flowsheet data found.

## 2020-03-12 NOTE — Evaluation (Signed)
Occupational Therapy Evaluation Patient Details Name: Austin Marsh MRN: [id number] DOB: 18-Jul-1955 Today's Date: 03/12/2020    History of Present Illness Pt is 64 y/o M with PMH: Schizoaffective disorder, bipolar type, COPD, and hypothyroidism. Pt presented to ER secondary to fall, increasing urinary frequency; sepsis secondary to UTI.   Clinical Impression   Pt was seen for OT evaluation this date. Prior to hospital admission, pt reports being INDEP with self care ADLs, facility assist at group home for IADLs, and pt reports no use of AD for fxl mobility. Currently pt demonstrates impairments as described below (See OT problem list) which functionally limit his ability to perform ADL/self-care tasks. Pt currently requires MOD A for ADL transfers with RW, MIN/MOD A for seated LB ADLs, and SETUP with MIN verbal cues to sequence seated UB ADLs.  Pt would benefit from skilled OT services to address noted impairments and functional limitations (see below for any additional details) in order to maximize safety and independence while minimizing falls risk and caregiver burden. Upon hospital discharge, recommend STR to maximize pt safety and return to PLOF.     Follow Up Recommendations  SNF    Equipment Recommendations  Other (comment) (defer to next level of care)    Recommendations for Other Services       Precautions / Restrictions Precautions Precautions: Fall Restrictions Weight Bearing Restrictions: No      Mobility Bed Mobility               General bed mobility comments: pt to chair pre/post session    Transfers   Equipment used: Rolling walker (2 wheeled) Transfers: Sit to/from Stand Sit to Stand: Mod assist         General transfer comment: tremulous, requires MOD tactile cues for safe hand placement, difficult shigfting weight and appears fearful of falling    Balance Overall balance assessment: Needs assistance Sitting-balance support: No upper extremity  supported;Feet supported Sitting balance-Leahy Scale: Fair     Standing balance support: Bilateral upper extremity supported Standing balance-Leahy Scale: Poor Standing balance comment: requires UE support and MOD A to sustain static stand, appears fearful of falling.                           ADL either performed or assessed with clinical judgement   ADL                                         General ADL Comments: Pt requires SETUP with MIN verbal cues to sequence seated UB ADLs, MIN/MOD A for seated LB ADLs and MOD A for ADL transfers with RW.     Vision   Additional Comments: unsure of pt baseline and vision difficult to assess d/t divided attention. Pt tracks with moderate appropriateness during evaluation.     Perception     Praxis      Pertinent Vitals/Pain Pain Assessment: Faces Faces Pain Scale: Hurts little more Pain Location: R hip/LE, L foot Pain Descriptors / Indicators: Aching;Guarding;Grimacing Pain Intervention(s): Limited activity within patient's tolerance;Monitored during session;Repositioned     Hand Dominance     Extremity/Trunk Assessment Upper Extremity Assessment Upper Extremity Assessment: Generalized weakness   Lower Extremity Assessment Lower Extremity Assessment: Generalized weakness       Communication Communication Communication: No difficulties   Cognition Arousal/Alertness: Awake/alert Behavior During Therapy: WFL for tasks  assessed/performed Overall Cognitive Status: Within Functional Limits for tasks assessed                                 General Comments: very internally/externally distractible; difficulty maintaining focus on task, topic of conversation.  Generally confused with poor insight into current situation.   General Comments       Exercises Other Exercises Other Exercises: OT facilitates ed re: role of OT, safety/fall prevention considerations, OT alters pt positioning in  chair as he is noted to be R lateral leaning. Pt with moderate reception of education.   Shoulder Instructions      Home Living Family/patient expects to be discharged to:: Group home Living Arrangements: Group Home                               Additional Comments: Single story, no steps per patient report      Prior Functioning/Environment Level of Independence: Needs assistance        Comments: Sup/mod indep with RW for household mobilization; does endorse multiple fall history        OT Problem List: Decreased strength;Decreased range of motion;Decreased activity tolerance;Impaired balance (sitting and/or standing);Decreased cognition;Decreased safety awareness;Decreased knowledge of use of DME or AE      OT Treatment/Interventions: Self-care/ADL training;DME and/or AE instruction;Therapeutic activities;Balance training;Therapeutic exercise;Patient/family education    OT Goals(Current goals can be found in the care plan section) Acute Rehab OT Goals Patient Stated Goal: to get a [employer] OT Goal Formulation: With patient Time For Goal Achievement: 03/26/20 Potential to Achieve Goals: Good ADL Goals Pt Will Perform Lower Body Dressing: with supervision;sit to/from stand (with AE PRN) Pt Will Transfer to Toilet: with supervision;with min guard assist;ambulating;bedside commode (BSC over toilet in restroom with LRAD) Pt/caregiver will Perform Home Exercise Program: Increased strength;Both right and left upper extremity;With minimal assist  OT Frequency: Min 1X/week   Barriers to D/C:            Co-evaluation              AM-PAC OT "6 Clicks" Daily Activity     Outcome Measure Help from another person eating meals?: None Help from another person taking care of personal grooming?: A Little Help from another person toileting, which includes using toliet, bedpan, or urinal?: A Lot Help from another person bathing (including washing, rinsing, drying)?:  A Lot Help from another person to put on and taking off regular upper body clothing?: A Little Help from another person to put on and taking off regular lower body clothing?: A Lot 6 Click Score: 16   End of Session Equipment Utilized During Treatment: Gait belt;Rolling walker Nurse Communication: Mobility status  Activity Tolerance: Patient tolerated treatment well Patient left: in chair;with call bell/phone within reach;with chair alarm set  OT Visit Diagnosis: Unsteadiness on feet (R26.81);Muscle weakness (generalized) (M62.81)                Time: 7322-0254 OT Time Calculation (min): 24 min Charges:  OT General Charges $OT Visit: 1 Visit OT Evaluation $OT Eval Moderate Complexity: 1 Mod OT Treatments $Self Care/Home Management : 8-22 mins  Rejeana Brock, MS, OTR/L ascom [phone] 03/12/20, 5:04 PM

## 2020-03-12 NOTE — NC FL2 (Signed)
[place] MEDICAID FL2 LEVEL OF CARE SCREENING TOOL     IDENTIFICATION  Patient Name: Austin Marsh Birthdate: [birth year] Sex: male Admission Date (Current Location): 03/11/2020  [facility] and [place] Number:      Facility and Address:         Provider Number: [id number]  Attending Physician Name and Address:  Charise Killian, MD  Relative Name and Phone Number:       Current Level of Care: Hospital Recommended Level of Care: Skilled Nursing Facility Prior Approval Number:    Date Approved/Denied:   PASRR Number: pending  Discharge Plan: SNF    Current Diagnoses: Patient Active Problem List   Diagnosis Date Noted  . UTI (urinary tract infection) 03/11/2020  . AKI (acute kidney injury) (HCC) 03/11/2020  . Sepsis (HCC) 03/11/2020  . COPD (chronic obstructive pulmonary disease) (HCC)   . Hypothyroidism   . Depression with anxiety   . Schizoaffective disorder, bipolar type (HCC)   . Tobacco abuse   . Fall   . Scrotal pain 06/26/2018    Orientation RESPIRATION BLADDER Height & Weight     Self, Situation, Place  Normal Incontinent, External catheter Weight: 81.6 kg Height:  5\' 9"  (175.3 cm)  BEHAVIORAL SYMPTOMS/MOOD NEUROLOGICAL BOWEL NUTRITION STATUS      Continent Diet (Regular)  AMBULATORY STATUS COMMUNICATION OF NEEDS Skin   Extensive Assist Verbally Normal                       Personal Care Assistance Level of Assistance              Functional Limitations Info             SPECIAL CARE FACTORS FREQUENCY  PT (By licensed PT), OT (By licensed OT)                    Contractures Contractures Info: Not present    Additional Factors Info  Code Status, Allergies Code Status Info: Full Allergies Info: Haldol, Thorazine, Trazodone And Nefazodone           Current Medications (03/12/2020):  This is the current hospital active medication list Current Facility-Administered Medications  Medication Dose Route Frequency  Provider Last Rate Last Admin  . 0.9 %  sodium chloride infusion   Intravenous Continuous 03/14/2020, MD 100 mL/hr at 03/12/20 0528 New Bag at 03/12/20 0528  . 0.9 %  sodium chloride infusion   Intravenous PRN 03/14/20, MD 5 mL/hr at 03/12/20 1039 250 mL at 03/12/20 1039  . acetaminophen (TYLENOL) tablet 650 mg  650 mg Oral Q6H PRN 03/14/20, MD      . albuterol (VENTOLIN HFA) 108 (90 Base) MCG/ACT inhaler 2 puff  2 puff Inhalation Q4H PRN Lorretta Harp, MD      . alum & mag hydroxide-simeth (MAALOX/MYLANTA) 200-200-20 MG/5ML suspension 30 mL  30 mL Oral Daily PRN 10-26-2000, MD      . benztropine (COGENTIN) tablet 2 mg  2 mg Oral BID Lorretta Harp, MD   2 mg at 03/12/20 1037  . busPIRone (BUSPAR) tablet 15 mg  15 mg Oral BID 03/14/20, MD   15 mg at 03/12/20 1037  . cefTRIAXone (ROCEPHIN) 1 g in sodium chloride 0.9 % 100 mL IVPB  1 g Intravenous Q24H 03/14/20, MD 200 mL/hr at 03/12/20 1040 1 g at 03/12/20 1040  . cholecalciferol (VITAMIN D3) tablet 2,000 Units  2,000 Units Oral Daily 03/14/20, MD  2,000 Units at 03/12/20 1037  . cloZAPine (CLOZARIL) tablet 100 mg  100 mg Oral Daily Tressie Ellis, RPH   100 mg at 03/12/20 1037  . cloZAPine (CLOZARIL) tablet 200 mg  200 mg Oral QHS Lorretta Harp, MD   200 mg at 03/11/20 2217  . dextromethorphan-guaiFENesin (MUCINEX DM) 30-600 MG per 12 hr tablet 1 tablet  1 tablet Oral BID PRN Lorretta Harp, MD   1 tablet at 03/12/20 0528  . divalproex (DEPAKOTE ER) 24 hr tablet 1,000 mg  1,000 mg Oral QHS Lorretta Harp, MD   1,000 mg at 03/11/20 2216  . enoxaparin (LOVENOX) injection 40 mg  40 mg Subcutaneous Q24H Lorretta Harp, MD   40 mg at 03/11/20 2216  . gabapentin (NEURONTIN) capsule 200 mg  200 mg Oral QHS Lorretta Harp, MD   200 mg at 03/11/20 2216  . levothyroxine (SYNTHROID) tablet 75 mcg  75 mcg Oral QAC breakfast Lorretta Harp, MD   75 mcg at 03/12/20 0528  . loperamide (IMODIUM) capsule 2 mg  2 mg Oral PRN Lorretta Harp, MD      . magnesium hydroxide (MILK  OF MAGNESIA) suspension 30 mL  30 mL Oral Daily PRN Lorretta Harp, MD      . nicotine (NICODERM CQ - dosed in mg/24 hours) patch 21 mg  21 mg Transdermal Daily Lorretta Harp, MD   21 mg at 03/12/20 1048  . ondansetron (ZOFRAN) injection 4 mg  4 mg Intravenous Q8H PRN Lorretta Harp, MD      . pantoprazole (PROTONIX) EC tablet 40 mg  40 mg Oral Daily Lorretta Harp, MD   40 mg at 03/12/20 1037  . tamsulosin (FLOMAX) capsule 0.4 mg  0.4 mg Oral BID Lorretta Harp, MD   0.4 mg at 03/12/20 1037  . vitamin B-12 (CYANOCOBALAMIN) tablet 100 mcg  100 mcg Oral Daily Lorretta Harp, MD   100 mcg at 03/12/20 1037     Discharge Medications: Please see discharge summary for a list of discharge medications.  Relevant Imaging Results:  Relevant Lab Results:   Additional Information ss [id number]  Chapman Fitch, RN

## 2020-03-13 DIAGNOSIS — D696 Thrombocytopenia, unspecified: Secondary | ICD-10-CM

## 2020-03-13 DIAGNOSIS — N3000 Acute cystitis without hematuria: Secondary | ICD-10-CM | POA: Diagnosis not present

## 2020-03-13 DIAGNOSIS — A419 Sepsis, unspecified organism: Secondary | ICD-10-CM | POA: Diagnosis not present

## 2020-03-13 LAB — CBC
HCT: 32.6 % — ABNORMAL LOW (ref 39.0–52.0)
Hemoglobin: 11.2 g/dL — ABNORMAL LOW (ref 13.0–17.0)
MCH: 33.8 pg (ref 26.0–34.0)
MCHC: 34.4 g/dL (ref 30.0–36.0)
MCV: 98.5 fL (ref 80.0–100.0)
Platelets: 96 10*3/uL — ABNORMAL LOW (ref 150–400)
RBC: 3.31 MIL/uL — ABNORMAL LOW (ref 4.22–5.81)
RDW: 15.2 % (ref 11.5–15.5)
WBC: 20.5 10*3/uL — ABNORMAL HIGH (ref 4.0–10.5)
nRBC: 0 % (ref 0.0–0.2)

## 2020-03-13 LAB — BASIC METABOLIC PANEL
Anion gap: 7 (ref 5–15)
BUN: 16 mg/dL (ref 8–23)
CO2: 25 mmol/L (ref 22–32)
Calcium: 8.1 mg/dL — ABNORMAL LOW (ref 8.9–10.3)
Chloride: 108 mmol/L (ref 98–111)
Creatinine, Ser: 1.03 mg/dL (ref 0.61–1.24)
GFR, Estimated: >60 mL/min (ref >60)
Glucose, Bld: 85 mg/dL (ref 70–99)
Potassium: 3.2 mmol/L — ABNORMAL LOW (ref 3.5–5.1)
Sodium: 140 mmol/L (ref 135–145)

## 2020-03-13 MED ORDER — POTASSIUM CHLORIDE CRYS ER 20 MEQ PO TBCR
40.0000 meq | EXTENDED_RELEASE_TABLET | Freq: Once | ORAL | Status: AC
  Administered 2020-03-13: 40 meq via ORAL
  Filled 2020-03-13: qty 2

## 2020-03-13 NOTE — Progress Notes (Signed)
PROGRESS NOTE    Austin Marsh  BDZ:[id number] DOB: Apr 18, 1956 DOA: 03/11/2020 PCP: Koren Bound, NP   Assessment & Plan:   Principal Problem:   UTI (urinary tract infection) Active Problems:   AKI (acute kidney injury) (HCC)   Sepsis (HCC)   COPD (chronic obstructive pulmonary disease) (HCC)   Hypothyroidism   Depression with anxiety   Schizoaffective disorder, bipolar type (HCC)   Tobacco abuse   Fall   Generalized weakness   Sepsis: secondary to UTI. Present on admission & meets criteria with leukocytosis, tachycardia. Continue on IVFs, IV abxs. Blood cxs NGTD   UTI: UA is positive. Urine cx is growing serratia marcescens, sens pending. Continue on IV ceftriaxone   Leukocytosis: likely secondary to UTI. Still elevated but trending down. Will continue to monitor   Thrombocytopenia: etiology unclear, possibly secondary to clozapine use. Will continue to monitor. Will hold lovenox today and re-evaluate tomorrow  Hypokalemia: KCl repleted. Will continue to monitor   AKI: resolved   Generalized weakness: PT recs SNF. CM is working on SNF placement   COPD: w/o exacerbation. Continue on bronchodilators and encourage incentive spirometry   Hypothyroidism: continue on home dose of synthroid   Depression: severity unknown. Continue on home dose of buspar.  Schizoaffective disorder, bipolar type : No SI or HI. Unknown type or severity. Continue on home dose of cogentin, clozapine, depakote   Tobacco abuse: smoking cessation counseling. Nicotine patch to prevent w/drawal      DVT prophylaxis: SCDs Code Status: full  Family Communication: Disposition Plan: likely d/c to SNF   Status is: Observation  The patient will require care spanning > 2 midnights and should be moved to inpatient because: Ongoing diagnostic testing needed not appropriate for outpatient work up and IV treatments appropriate due to intensity of illness or inability to take PO, awaiting on final  urine cx results and CM is working on SNF placement   Dispo: The patient is from: Home              Anticipated d/c is to: SNF              Anticipated d/c date is: 2 days or 3 days               Patient currently is not medically stable to d/c.    Consultants:     Procedures:    Antimicrobials: ceftriaxone   Subjective: Pt c/o malaise  Objective: Vitals:   03/12/20 1701 03/12/20 2035 03/13/20 0000 03/13/20 0455  BP: 113/66 110/66 (!) 105/51 128/63  Pulse: 85 63 66 79  Resp: 20 18 18 20   Temp: 97.8 F (36.6 C) 97.8 F (36.6 C) 98.4 F (36.9 C) 98.4 F (36.9 C)  TempSrc:  Oral Oral Oral  SpO2: 99% 98% 96% 99%  Weight:      Height:        Intake/Output Summary (Last 24 hours) at 03/13/2020 0741 Last data filed at 03/13/2020 0308 Gross per 24 hour  Intake 2857.03 ml  Output 600 ml  Net 2257.03 ml   Filed Weights   03/11/20 1106  Weight: 81.6 kg    Examination:  General exam: Appears calm and sleepy  Respiratory system: clear breath sounds b/l. No wheezes Cardiovascular system: S1/S2+. No rubs or gallops  Gastrointestinal system: Abdomen is nondistended, soft and nontender. Hypoactive bowel sounds heard. Central nervous system: Lethargic. Moves all 4 extremities  Psychiatry: Judgement and insight appear normal. Flat mood and affect    Data  Reviewed: I have personally reviewed following labs and imaging studies  CBC: Recent Labs  Lab 03/11/20 1220 03/12/20 0512 03/13/20 0524  WBC 32.1* 28.9* 20.5*  NEUTROABS 25.7*  --   --   HGB 13.1 11.7* 11.2*  HCT 38.4* 35.1* 32.6*  MCV 98.5 98.9 98.5  PLT 125* 107* 96*   Basic Metabolic Panel: Recent Labs  Lab 03/11/20 1220 03/12/20 0924 03/13/20 0524  NA 138 140 140  K 4.2 3.6 3.2*  CL 102 107 108  CO2 26 22 25   GLUCOSE 100* 99 85  BUN 20 18 16   CREATININE 1.79* 1.24 1.03  CALCIUM 9.2 8.3* 8.1*   GFR: Estimated Creatinine Clearance: 70.5 mL/min (by C-G formula based on SCr of 1.03  mg/dL). Liver Function Tests: Recent Labs  Lab 03/11/20 1220  AST 18  ALT 8  ALKPHOS 59  BILITOT 1.2  PROT 6.4*  ALBUMIN 3.1*   No results for input(s): LIPASE, AMYLASE in the last 168 hours. No results for input(s): AMMONIA in the last 168 hours. Coagulation Profile: No results for input(s): INR, PROTIME in the last 168 hours. Cardiac Enzymes: No results for input(s): CKTOTAL, CKMB, CKMBINDEX, TROPONINI in the last 168 hours. BNP (last 3 results) No results for input(s): PROBNP in the last 8760 hours. HbA1C: No results for input(s): HGBA1C in the last 72 hours. CBG: No results for input(s): GLUCAP in the last 168 hours. Lipid Profile: No results for input(s): CHOL, HDL, LDLCALC, TRIG, CHOLHDL, LDLDIRECT in the last 72 hours. Thyroid Function Tests: No results for input(s): TSH, T4TOTAL, FREET4, T3FREE, THYROIDAB in the last 72 hours. Anemia Panel: No results for input(s): VITAMINB12, FOLATE, FERRITIN, TIBC, IRON, RETICCTPCT in the last 72 hours. Sepsis Labs: Recent Labs  Lab 03/11/20 1412  PROCALCITON 0.30  LATICACIDVEN 1.3    Recent Results (from the past 240 hour(s))  Resp Panel by RT-PCR (Flu A&B, Covid) Nasopharyngeal Swab     Status: None   Collection Time: 03/11/20  1:35 PM   Specimen: Nasopharyngeal Swab; Nasopharyngeal(NP) swabs in vial transport medium  Result Value Ref Range Status   SARS Coronavirus 2 by RT PCR NEGATIVE NEGATIVE Final    Comment: (NOTE) SARS-CoV-2 target nucleic acids are NOT DETECTED.  The SARS-CoV-2 RNA is generally detectable in upper respiratory specimens during the acute phase of infection. The lowest concentration of SARS-CoV-2 viral copies this assay can detect is 138 copies/mL. A negative result does not preclude SARS-Cov-2 infection and should not be used as the sole basis for treatment or other patient management decisions. A negative result may occur with  improper specimen collection/handling, submission of specimen  other than nasopharyngeal swab, presence of viral mutation(s) within the areas targeted by this assay, and inadequate number of viral copies(<138 copies/mL). A negative result must be combined with clinical observations, patient history, and epidemiological information. The expected result is Negative.  Fact Sheet for Patients:  BloggerCourse.comhttps://www.fda.gov/media/152166/download  Fact Sheet for Healthcare Providers:  SeriousBroker.ithttps://www.fda.gov/media/152162/download  This test is no t yet approved or cleared by the [place] FDA and  has been authorized for detection and/or diagnosis of SARS-CoV-2 by FDA under an Emergency Use Authorization (EUA). This EUA will remain  in effect (meaning this test can be used) for the duration of the COVID-19 declaration under Section 564(b)(1) of the Act, 21 U.S.C.section 360bbb-3(b)(1), unless the authorization is terminated  or revoked sooner.       Influenza A by PCR NEGATIVE NEGATIVE Final   Influenza B by PCR NEGATIVE NEGATIVE  Final    Comment: (NOTE) The Xpert Xpress SARS-CoV-2/FLU/RSV plus assay is intended as an aid in the diagnosis of influenza from Nasopharyngeal swab specimens and should not be used as a sole basis for treatment. Nasal washings and aspirates are unacceptable for Xpert Xpress SARS-CoV-2/FLU/RSV testing.  Fact Sheet for Patients: BloggerCourse.com  Fact Sheet for Healthcare Providers: SeriousBroker.it  This test is not yet approved or cleared by the [place] FDA and has been authorized for detection and/or diagnosis of SARS-CoV-2 by FDA under an Emergency Use Authorization (EUA). This EUA will remain in effect (meaning this test can be used) for the duration of the COVID-19 declaration under Section 564(b)(1) of the Act, 21 U.S.C. section 360bbb-3(b)(1), unless the authorization is terminated or revoked.  Performed at [facility], [address]  [place], [place] [address]   CULTURE, BLOOD (ROUTINE X 2) w Reflex to ID Panel     Status: None (Preliminary result)   Collection Time: 03/11/20  2:12 PM   Specimen: BLOOD  Result Value Ref Range Status   Specimen Description BLOOD BLOOD LEFT FOREARM  Final   Special Requests   Final    BOTTLES DRAWN AEROBIC AND ANAEROBIC Blood Culture results may not be optimal due to an inadequate volume of blood received in culture bottles   Culture   Final    NO GROWTH 2 DAYS Performed at [facility], [address], [place], [place] [address]    Report Status PENDING  Incomplete  CULTURE, BLOOD (ROUTINE X 2) w Reflex to ID Panel     Status: None (Preliminary result)   Collection Time: 03/11/20  2:12 PM   Specimen: BLOOD  Result Value Ref Range Status   Specimen Description BLOOD BLOOD RIGHT FOREARM  Final   Special Requests   Final    BOTTLES DRAWN AEROBIC AND ANAEROBIC Blood Culture results may not be optimal due to an inadequate volume of blood received in culture bottles   Culture   Final    NO GROWTH 2 DAYS Performed at [facility], [address], [place], [place] [address]    Report Status PENDING  Incomplete         Radiology Studies: CT Head Wo Contrast  Result Date: 03/11/2020 CLINICAL DATA:  The patient suffered a recent fall. EXAM: CT HEAD WITHOUT CONTRAST CT CERVICAL SPINE WITHOUT CONTRAST TECHNIQUE: Multidetector CT imaging of the head and cervical spine was performed following the standard protocol without intravenous contrast. Multiplanar CT image reconstructions of the cervical spine were also generated. COMPARISON:  Head and cervical spine CT scan 06/21/2018. Head CT 01/23/2020. FINDINGS: CT HEAD FINDINGS Brain: No evidence of acute infarction, hemorrhage, hydrocephalus, extra-axial collection or mass lesion/mass effect. Cortical atrophy again seen. Vascular: No hyperdense vessel or unexpected calcification. Skull: Intact.  No focal lesion. Sinuses/Orbits:  Negative. Other: None. CT CERVICAL SPINE FINDINGS Alignment: Maintained. Skull base and vertebrae: No acute fracture. No primary bone lesion or focal pathologic process. Soft tissues and spinal canal: No prevertebral fluid or swelling. No visible canal hematoma. Disc levels: Mild loss of disc space height and bulging C3-4 and C5-6. Upper chest: Negative. Other: None. IMPRESSION: No acute abnormality head or cervical spine. Cortical atrophy. Mild cervical degenerative change. Electronically Signed   By: Drusilla Kanner M.D.   On: 03/11/2020 12:14   CT Cervical Spine Wo Contrast  Result Date: 03/11/2020 CLINICAL DATA:  The patient suffered a recent fall. EXAM: CT HEAD WITHOUT CONTRAST CT CERVICAL SPINE WITHOUT CONTRAST  TECHNIQUE: Multidetector CT imaging of the head and cervical spine was performed following the standard protocol without intravenous contrast. Multiplanar CT image reconstructions of the cervical spine were also generated. COMPARISON:  Head and cervical spine CT scan 06/21/2018. Head CT 01/23/2020. FINDINGS: CT HEAD FINDINGS Brain: No evidence of acute infarction, hemorrhage, hydrocephalus, extra-axial collection or mass lesion/mass effect. Cortical atrophy again seen. Vascular: No hyperdense vessel or unexpected calcification. Skull: Intact.  No focal lesion. Sinuses/Orbits: Negative. Other: None. CT CERVICAL SPINE FINDINGS Alignment: Maintained. Skull base and vertebrae: No acute fracture. No primary bone lesion or focal pathologic process. Soft tissues and spinal canal: No prevertebral fluid or swelling. No visible canal hematoma. Disc levels: Mild loss of disc space height and bulging C3-4 and C5-6. Upper chest: Negative. Other: None. IMPRESSION: No acute abnormality head or cervical spine. Cortical atrophy. Mild cervical degenerative change. Electronically Signed   By: Drusilla Kanner M.D.   On: 03/11/2020 12:14   DG Chest Portable 1 View  Result Date: 03/11/2020 CLINICAL DATA:  Fall.  EXAM: PORTABLE CHEST 1 VIEW COMPARISON:  01/23/2020.  06/21/2018. FINDINGS: Given technique mediastinum appears stable. Heart size stable. Interval clearing of right upper lung infiltrate. Mild infiltrate left upper lung cannot be excluded. Chronic interstitial changes again noted. No pleural effusion or pneumothorax. Degenerative change thoracic spine. No acute bony abnormality identified. IMPRESSION: 1. Interval clearing of right upper lung infiltrate. Mild infiltrate left upper lung cannot be excluded. Chronic interstitial changes again noted. 2.  No acute bony abnormality identified.  No pneumothorax. Electronically Signed   By: Maisie Fus  Register   On: 03/11/2020 12:28   DG Foot Complete Left  Result Date: 03/11/2020 CLINICAL DATA:  Left foot pain after a fall this morning. Initial encounter. EXAM: LEFT FOOT - COMPLETE 3+ VIEW COMPARISON:  None. FINDINGS: There is no evidence of fracture or dislocation. There is no evidence of arthropathy or other focal bone abnormality. Soft tissues are unremarkable. IMPRESSION: Negative exam. Electronically Signed   By: Drusilla Kanner M.D.   On: 03/11/2020 12:24   DG Hip Unilat W or Wo Pelvis 2-3 Views Left  Result Date: 03/11/2020 CLINICAL DATA:  Left hip pain after a fall this morning. Initial encounter. EXAM: DG HIP (WITH OR WITHOUT PELVIS) 2-3V LEFT COMPARISON:  None. FINDINGS: There is no evidence of hip fracture or dislocation. Mild bilateral hip degenerative change is seen. Soft tissues are negative. IMPRESSION: No acute abnormality. Electronically Signed   By: Drusilla Kanner M.D.   On: 03/11/2020 12:26        Scheduled Meds: . benztropine  2 mg Oral BID  . busPIRone  15 mg Oral BID  . cholecalciferol  2,000 Units Oral Daily  . cloZAPine  100 mg Oral Daily  . cloZAPine  200 mg Oral QHS  . divalproex  1,000 mg Oral QHS  . enoxaparin (LOVENOX) injection  40 mg Subcutaneous Q24H  . gabapentin  200 mg Oral QHS  . levothyroxine  75 mcg Oral QAC  breakfast  . nicotine  21 mg Transdermal Daily  . pantoprazole  40 mg Oral Daily  . tamsulosin  0.4 mg Oral BID  . vitamin B-12  100 mcg Oral Daily   Continuous Infusions: . sodium chloride 100 mL/hr at 03/13/20 0339  . sodium chloride Stopped (03/12/20 1942)  . cefTRIAXone (ROCEPHIN)  IV Stopped (03/12/20 1112)     LOS: 2 days    Time spent: 30 mins    Charise Killian, MD [facility] Pager [phone]  [phone]  If 7PM-7AM, please contact night-coverage 03/13/2020, 7:41 AM

## 2020-03-14 DIAGNOSIS — F250 Schizoaffective disorder, bipolar type: Secondary | ICD-10-CM | POA: Diagnosis not present

## 2020-03-14 DIAGNOSIS — N3000 Acute cystitis without hematuria: Secondary | ICD-10-CM | POA: Diagnosis not present

## 2020-03-14 DIAGNOSIS — R531 Weakness: Secondary | ICD-10-CM | POA: Diagnosis not present

## 2020-03-14 LAB — RESP PANEL BY RT-PCR (FLU A&B, COVID) ARPGX2
Influenza A by PCR: NEGATIVE
Influenza B by PCR: NEGATIVE
SARS Coronavirus 2 by RT PCR: NEGATIVE

## 2020-03-14 LAB — BASIC METABOLIC PANEL
Anion gap: 8 (ref 5–15)
BUN: 14 mg/dL (ref 8–23)
CO2: 22 mmol/L (ref 22–32)
Calcium: 8.1 mg/dL — ABNORMAL LOW (ref 8.9–10.3)
Chloride: 110 mmol/L (ref 98–111)
Creatinine, Ser: 0.97 mg/dL (ref 0.61–1.24)
GFR, Estimated: >60 mL/min (ref >60)
Glucose, Bld: 93 mg/dL (ref 70–99)
Potassium: 4 mmol/L (ref 3.5–5.1)
Sodium: 140 mmol/L (ref 135–145)

## 2020-03-14 LAB — CBC
HCT: 33.6 % — ABNORMAL LOW (ref 39.0–52.0)
Hemoglobin: 11.4 g/dL — ABNORMAL LOW (ref 13.0–17.0)
MCH: 33.3 pg (ref 26.0–34.0)
MCHC: 33.9 g/dL (ref 30.0–36.0)
MCV: 98.2 fL (ref 80.0–100.0)
Platelets: 116 10*3/uL — ABNORMAL LOW (ref 150–400)
RBC: 3.42 MIL/uL — ABNORMAL LOW (ref 4.22–5.81)
RDW: 15.1 % (ref 11.5–15.5)
WBC: 11.4 10*3/uL — ABNORMAL HIGH (ref 4.0–10.5)
nRBC: 0 % (ref 0.0–0.2)

## 2020-03-14 LAB — URINE CULTURE: Culture: 70000 — AB

## 2020-03-14 MED ORDER — ENOXAPARIN SODIUM 40 MG/0.4ML ~~LOC~~ SOLN
40.0000 mg | SUBCUTANEOUS | Status: DC
  Administered 2020-03-14 – 2020-03-16 (×3): 40 mg via SUBCUTANEOUS
  Filled 2020-03-14 (×3): qty 0.4

## 2020-03-14 NOTE — Progress Notes (Addendum)
PROGRESS NOTE    Austin Marsh  LNL:[id number] DOB: March 09, 1956 DOA: 03/11/2020 PCP: Koren Bound, NP   Assessment & Plan:   Principal Problem:   UTI (urinary tract infection) Active Problems:   AKI (acute kidney injury) (HCC)   Sepsis (HCC)   COPD (chronic obstructive pulmonary disease) (HCC)   Hypothyroidism   Depression with anxiety   Schizoaffective disorder, bipolar type (HCC)   Tobacco abuse   Fall   Generalized weakness   Sepsis: secondary to UTI. Present on admission & meets criteria with leukocytosis, tachycardia. Continue on IVFs, IV abxs. Blood cxs NGTD   UTI: UA is positive. Urine cx is growing serratia marcescens. Continue on IV ceftriaxone while inpatient and switch to po abxs at d/c.  Leukocytosis: likely secondary to UTI. Continues to trend down. Continue on IV abxs   Thrombocytopenia: etiology unclear, possibly secondary to clozapine use. Trending up today. Will continue to monitor   Hypokalemia: WNL today.   AKI: resolved   Generalized weakness: PT recs SNF. Been accepted at [facility] but no beds today as per CM   COPD: w/o exacerbation. Continue on bronchodilators and encourage incentive spirometry   Hypothyroidism: continue on home dose of levothyroxine   Depression: severity unknown. Continue on home dose of buspar  Schizoaffective disorder, bipolar type : No SI or HI. Unknown type or severity. Continue on home dose of cogentin, clozapine, depakote   Tobacco abuse: smoking cessation counseling. Nicotine patch to prevent w/drawal     DVT prophylaxis: lovenox  Code Status: full  Family Communication: Disposition Plan: likely d/c to SNF   Status is: Inpatient  Remains inpatient appropriate because:Unsafe d/c plan, awaiting bed placement at [facility]    Dispo: The patient is from: Home              Anticipated d/c is to: SNF              Anticipated d/c date is: 2 days              Patient currently is medically stable to  d/c.      Consultants:     Procedures:    Antimicrobials: ceftriaxone   Subjective: Pt c/o fatigue   Objective: Vitals:   03/13/20 1159 03/13/20 1709 03/13/20 2106 03/14/20 0613  BP: 110/74 99/64 120/67 107/64  Pulse: 70 68 78 77  Resp: 20 16 20 18   Temp: 98.4 F (36.9 C) 97.6 F (36.4 C) 97.7 F (36.5 C) 98 F (36.7 C)  TempSrc:  Oral Oral Oral  SpO2: 98% 97% 100% 96%  Weight:      Height:        Intake/Output Summary (Last 24 hours) at 03/14/2020 0746 Last data filed at 03/14/2020 0656 Gross per 24 hour  Intake --  Output 400 ml  Net -400 ml   Filed Weights   03/11/20 1106  Weight: 81.6 kg    Examination:  General exam: Appears calm & comfortable  Respiratory system: clear breath sounds b/l. No rales, wheezes Cardiovascular system: S1/S2+. No rubs or gallops Gastrointestinal system: Abdomen is nondistended, soft and nontender. Hypoactive bowel sounds heard. Central nervous system: Alert and oriented. Moves all 4 extremities   Psychiatry: Judgement and insight appear normal. Flat mood and affect    Data Reviewed: I have personally reviewed following labs and imaging studies  CBC: Recent Labs  Lab 03/11/20 1220 03/12/20 0512 03/13/20 0524 03/14/20 0627  WBC 32.1* 28.9* 20.5* 11.4*  NEUTROABS 25.7*  --   --   --  HGB 13.1 11.7* 11.2* 11.4*  HCT 38.4* 35.1* 32.6* 33.6*  MCV 98.5 98.9 98.5 98.2  PLT 125* 107* 96* 116*   Basic Metabolic Panel: Recent Labs  Lab 03/11/20 1220 03/12/20 0924 03/13/20 0524  NA 138 140 140  K 4.2 3.6 3.2*  CL 102 107 108  CO2 26 22 25   GLUCOSE 100* 99 85  BUN 20 18 16   CREATININE 1.79* 1.24 1.03  CALCIUM 9.2 8.3* 8.1*   GFR: Estimated Creatinine Clearance: 70.5 mL/min (by C-G formula based on SCr of 1.03 mg/dL). Liver Function Tests: Recent Labs  Lab 03/11/20 1220  AST 18  ALT 8  ALKPHOS 59  BILITOT 1.2  PROT 6.4*  ALBUMIN 3.1*   No results for input(s): LIPASE, AMYLASE in the last 168  hours. No results for input(s): AMMONIA in the last 168 hours. Coagulation Profile: No results for input(s): INR, PROTIME in the last 168 hours. Cardiac Enzymes: No results for input(s): CKTOTAL, CKMB, CKMBINDEX, TROPONINI in the last 168 hours. BNP (last 3 results) No results for input(s): PROBNP in the last 8760 hours. HbA1C: No results for input(s): HGBA1C in the last 72 hours. CBG: No results for input(s): GLUCAP in the last 168 hours. Lipid Profile: No results for input(s): CHOL, HDL, LDLCALC, TRIG, CHOLHDL, LDLDIRECT in the last 72 hours. Thyroid Function Tests: No results for input(s): TSH, T4TOTAL, FREET4, T3FREE, THYROIDAB in the last 72 hours. Anemia Panel: No results for input(s): VITAMINB12, FOLATE, FERRITIN, TIBC, IRON, RETICCTPCT in the last 72 hours. Sepsis Labs: Recent Labs  Lab 03/11/20 1412  PROCALCITON 0.30  LATICACIDVEN 1.3    Recent Results (from the past 240 hour(s))  Urine culture     Status: Abnormal (Preliminary result)   Collection Time: 03/11/20 12:28 PM   Specimen: Urine, Random  Result Value Ref Range Status   Specimen Description   Final    URINE, RANDOM Performed at [facility], [address], [place], [address] [place]    Special Requests   Final    NONE Performed at [facility], [address], , [address] [place]    Culture (A)  Final    70,000 COLONIES/mL SERRATIA MARCESCENS SUSCEPTIBILITIES TO FOLLOW Performed at [facility], [address], [place], [address] [place]    Report Status PENDING  Incomplete  Resp Panel by RT-PCR (Flu A&B, Covid) Nasopharyngeal Swab     Status: None   Collection Time: 03/11/20  1:35 PM   Specimen: Nasopharyngeal Swab; Nasopharyngeal(NP) swabs in vial transport medium  Result Value Ref Range Status   SARS Coronavirus 2 by RT PCR NEGATIVE NEGATIVE Final    Comment: (NOTE) SARS-CoV-2 target nucleic acids are NOT DETECTED.  The SARS-CoV-2 RNA is generally detectable  in upper respiratory specimens during the acute phase of infection. The lowest concentration of SARS-CoV-2 viral copies this assay can detect is 138 copies/mL. A negative result does not preclude SARS-Cov-2 infection and should not be used as the sole basis for treatment or other patient management decisions. A negative result may occur with  improper specimen collection/handling, submission of specimen other than nasopharyngeal swab, presence of viral mutation(s) within the areas targeted by this assay, and inadequate number of viral copies(<138 copies/mL). A negative result must be combined with clinical observations, patient history, and epidemiological information. The expected result is Negative.  Fact Sheet for Patients:  82956  Fact Sheet for Healthcare Providers:  03/13/20  This test is no t yet approved or cleared by  the [employer] and  has been authorized for detection and/or diagnosis of SARS-CoV-2 by FDA under an Emergency Use Authorization (EUA). This EUA will remain  in effect (meaning this test can be used) for the duration of the COVID-19 declaration under Section 564(b)(1) of the Act, 21 U.S.C.section 360bbb-3(b)(1), unless the authorization is terminated  or revoked sooner.       Influenza A by PCR NEGATIVE NEGATIVE Final   Influenza B by PCR NEGATIVE NEGATIVE Final    Comment: (NOTE) The Xpert Xpress SARS-CoV-2/FLU/RSV plus assay is intended as an aid in the diagnosis of influenza from Nasopharyngeal swab specimens and should not be used as a sole basis for treatment. Nasal washings and aspirates are unacceptable for Xpert Xpress SARS-CoV-2/FLU/RSV testing.  Fact Sheet for Patients: BloggerCourse.com  Fact Sheet for Healthcare Providers: SeriousBroker.it  This test is not yet approved or cleared by the [place] FDA and has  been authorized for detection and/or diagnosis of SARS-CoV-2 by FDA under an Emergency Use Authorization (EUA). This EUA will remain in effect (meaning this test can be used) for the duration of the COVID-19 declaration under Section 564(b)(1) of the Act, 21 U.S.C. section 360bbb-3(b)(1), unless the authorization is terminated or revoked.  Performed at [facility], [address] [place], [place] [address]   CULTURE, BLOOD (ROUTINE X 2) w Reflex to ID Panel     Status: None (Preliminary result)   Collection Time: 03/11/20  2:12 PM   Specimen: BLOOD  Result Value Ref Range Status   Specimen Description BLOOD BLOOD LEFT FOREARM  Final   Special Requests   Final    BOTTLES DRAWN AEROBIC AND ANAEROBIC Blood Culture results may not be optimal due to an inadequate volume of blood received in culture bottles   Culture   Final    NO GROWTH 3 DAYS Performed at [facility], [address] [place], [place] [address]    Report Status PENDING  Incomplete  CULTURE, BLOOD (ROUTINE X 2) w Reflex to ID Panel     Status: None (Preliminary result)   Collection Time: 03/11/20  2:12 PM   Specimen: BLOOD  Result Value Ref Range Status   Specimen Description BLOOD BLOOD RIGHT FOREARM  Final   Special Requests   Final    BOTTLES DRAWN AEROBIC AND ANAEROBIC Blood Culture results may not be optimal due to an inadequate volume of blood received in culture bottles   Culture   Final    NO GROWTH 3 DAYS Performed at [facility], [address] [place], [place] [address]    Report Status PENDING  Incomplete         Radiology Studies: No results found.      Scheduled Meds: . benztropine  2 mg Oral BID  . busPIRone  15 mg Oral BID  . cholecalciferol  2,000 Units Oral Daily  . cloZAPine  100 mg Oral Daily  . cloZAPine  200 mg Oral QHS  . divalproex  1,000 mg Oral QHS  . gabapentin  200 mg Oral QHS  . levothyroxine  75 mcg Oral QAC breakfast  . nicotine  21 mg  Transdermal Daily  . pantoprazole  40 mg Oral Daily  . tamsulosin  0.4 mg Oral BID  . vitamin B-12  100 mcg Oral Daily   Continuous Infusions: . sodium chloride 100 mL/hr at 03/13/20 0339  . sodium chloride Stopped (03/12/20 1942)  . cefTRIAXone (ROCEPHIN)  IV 1 g (03/13/20 0837)  LOS: 3 days    Time spent: 31 mins    Charise KillianJamiese M Shedrick Sarli, MD [facility] Pager [phone]  If 7PM-7AM, please contact night-coverage 03/14/2020, 7:46 AM

## 2020-03-15 DIAGNOSIS — R531 Weakness: Secondary | ICD-10-CM | POA: Diagnosis not present

## 2020-03-15 DIAGNOSIS — F250 Schizoaffective disorder, bipolar type: Secondary | ICD-10-CM | POA: Diagnosis not present

## 2020-03-15 DIAGNOSIS — N3000 Acute cystitis without hematuria: Secondary | ICD-10-CM | POA: Diagnosis not present

## 2020-03-15 LAB — BASIC METABOLIC PANEL
Anion gap: 7 (ref 5–15)
BUN: 14 mg/dL (ref 8–23)
CO2: 24 mmol/L (ref 22–32)
Calcium: 8.1 mg/dL — ABNORMAL LOW (ref 8.9–10.3)
Chloride: 106 mmol/L (ref 98–111)
Creatinine, Ser: 1.04 mg/dL (ref 0.61–1.24)
GFR, Estimated: >60 mL/min (ref >60)
Glucose, Bld: 98 mg/dL (ref 70–99)
Potassium: 4 mmol/L (ref 3.5–5.1)
Sodium: 137 mmol/L (ref 135–145)

## 2020-03-15 LAB — CBC
HCT: 34.2 % — ABNORMAL LOW (ref 39.0–52.0)
Hemoglobin: 11.6 g/dL — ABNORMAL LOW (ref 13.0–17.0)
MCH: 33 pg (ref 26.0–34.0)
MCHC: 33.9 g/dL (ref 30.0–36.0)
MCV: 97.2 fL (ref 80.0–100.0)
Platelets: 120 10*3/uL — ABNORMAL LOW (ref 150–400)
RBC: 3.52 MIL/uL — ABNORMAL LOW (ref 4.22–5.81)
RDW: 15.1 % (ref 11.5–15.5)
WBC: 10.2 10*3/uL (ref 4.0–10.5)
nRBC: 0 % (ref 0.0–0.2)

## 2020-03-15 MED ORDER — BISACODYL 5 MG PO TBEC: 10.0000 mg | DELAYED_RELEASE_TABLET | Freq: Every day | ORAL | Status: DC | PRN

## 2020-03-15 MED ORDER — DOCUSATE SODIUM 100 MG PO CAPS
200.0000 mg | ORAL_CAPSULE | Freq: Two times a day (BID) | ORAL | Status: DC
  Administered 2020-03-15 – 2020-03-17 (×5): 200 mg via ORAL
  Filled 2020-03-15 (×6): qty 2

## 2020-03-15 NOTE — Progress Notes (Signed)
PROGRESS NOTE    Austin Marsh  XLK:[id number] DOB: 06/28/1955 DOA: 03/11/2020 PCP: Austin Bound, NP   Assessment & Plan:   Principal Problem:   UTI (urinary tract infection) Active Problems:   AKI (acute kidney injury) (HCC)   Sepsis (HCC)   COPD (chronic obstructive pulmonary disease) (HCC)   Hypothyroidism   Depression with anxiety   Schizoaffective disorder, bipolar type (HCC)   Tobacco abuse   Fall   Generalized weakness   Sepsis: secondary to UTI. Present on admission & meets criteria with leukocytosis, tachycardia. Continue on IVFs, IV abxs. Blood cxs NGTD. Resolved  UTI: UA is positive. Urine cx is growing serratia marcescens. Continue on IV ceftriaxone while inpatient and switch to po abxs at d/c.  Leukocytosis: resolved   Thrombocytopenia: etiology unclear, possibly secondary to clozapine use. Will continue to monitor   Hypokalemia: within normal limits today   AKI: resolved   Generalized weakness: PT recs SNF. Been accepted at [facility] but no beds today as per CM   COPD: w/o exacerbation. Continue on bronchodilators and encourage incentive spirometry   Hypothyroidism: continue on home dose of levothyroxine   Depression: severity unknown. Continue on home dose of buspar  Schizoaffective disorder, bipolar type : No SI or HI. Unknown type or severity. Continue on home dose of cogentin, clozapine, depakote   Tobacco abuse: smoking cessation counseling. Nicotine patch to prevent w/drawal    DVT prophylaxis: lovenox  Code Status: full  Family Communication: Disposition Plan: likely d/c to SNF   Status is: Inpatient  Remains inpatient appropriate because:Unsafe d/c plan, awaiting bed placement at [facility] still   Dispo: The patient is from: Home              Anticipated d/c is to: SNF              Anticipated d/c date is: 2 days              Patient currently is medically stable to d/c.      Consultants:     Procedures:    Antimicrobials:  ceftriaxone   Subjective: Pt c/o malaise   Objective: Vitals:   03/14/20 2003 03/15/20 0007 03/15/20 0502 03/15/20 0729  BP: 127/64 137/81 140/84 127/82  Pulse: 76 80 72 74  Resp: 18 20  16   Temp: 97.8 F (36.6 C) 97.7 F (36.5 C) 98 F (36.7 C) 97.6 F (36.4 C)  TempSrc: Oral Oral Oral   SpO2: 99% 100% 99% 98%  Weight:      Height:        Intake/Output Summary (Last 24 hours) at 03/15/2020 0753 Last data filed at 03/15/2020 0703 Gross per 24 hour  Intake 4689.81 ml  Output 1400 ml  Net 3289.81 ml   Filed Weights   03/11/20 1106  Weight: 81.6 kg    Examination:  General exam: Appears calm & comfortable  Respiratory system: diminished breath sounds b/l. No wheezes Cardiovascular system: S1/S2+. No rubs or gallops Gastrointestinal system: Abdomen is nondistended, soft and nontender. Hypoactive bowel sounds heard. Central nervous system: Alert and oriented. Moves all 4 extremities   Psychiatry: Judgement and insight appear normal. Flat mood and affect    Data Reviewed: I have personally reviewed following labs and imaging studies  CBC: Recent Labs  Lab 03/11/20 1220 03/12/20 0512 03/13/20 0524 03/14/20 0627 03/15/20 0536  WBC 32.1* 28.9* 20.5* 11.4* 10.2  NEUTROABS 25.7*  --   --   --   --   HGB 13.1 11.7*  11.2* 11.4* 11.6*  HCT 38.4* 35.1* 32.6* 33.6* 34.2*  MCV 98.5 98.9 98.5 98.2 97.2  PLT 125* 107* 96* 116* 120*   Basic Metabolic Panel: Recent Labs  Lab 03/11/20 1220 03/12/20 0924 03/13/20 0524 03/14/20 0627 03/15/20 0536  NA 138 140 140 140 137  K 4.2 3.6 3.2* 4.0 4.0  CL 102 107 108 110 106  CO2 26 22 25 22 24   GLUCOSE 100* 99 85 93 98  BUN 20 18 16 14 14   CREATININE 1.79* 1.24 1.03 0.97 1.04  CALCIUM 9.2 8.3* 8.1* 8.1* 8.1*   GFR: Estimated Creatinine Clearance: 69.9 mL/min (by C-G formula based on SCr of 1.04 mg/dL). Liver Function Tests: Recent Labs  Lab 03/11/20 1220  AST 18  ALT 8  ALKPHOS 59  BILITOT 1.2  PROT 6.4*    ALBUMIN 3.1*   No results for input(s): LIPASE, AMYLASE in the last 168 hours. No results for input(s): AMMONIA in the last 168 hours. Coagulation Profile: No results for input(s): INR, PROTIME in the last 168 hours. Cardiac Enzymes: No results for input(s): CKTOTAL, CKMB, CKMBINDEX, TROPONINI in the last 168 hours. BNP (last 3 results) No results for input(s): PROBNP in the last 8760 hours. HbA1C: No results for input(s): HGBA1C in the last 72 hours. CBG: No results for input(s): GLUCAP in the last 168 hours. Lipid Profile: No results for input(s): CHOL, HDL, LDLCALC, TRIG, CHOLHDL, LDLDIRECT in the last 72 hours. Thyroid Function Tests: No results for input(s): TSH, T4TOTAL, FREET4, T3FREE, THYROIDAB in the last 72 hours. Anemia Panel: No results for input(s): VITAMINB12, FOLATE, FERRITIN, TIBC, IRON, RETICCTPCT in the last 72 hours. Sepsis Labs: Recent Labs  Lab 03/11/20 1412  PROCALCITON 0.30  LATICACIDVEN 1.3    Recent Results (from the past 240 hour(s))  Urine culture     Status: Abnormal   Collection Time: 03/11/20 12:28 PM   Specimen: Urine, Random  Result Value Ref Range Status   Specimen Description   Final    URINE, RANDOM Performed at [facility], [address] [place], [place] [address]    Special Requests   Final    NONE Performed at [facility], [address] [place], [place] [address]    Culture 70,000 COLONIES/mL SERRATIA MARCESCENS (A)  Final   Report Status 03/14/2020 FINAL  Final   Organism ID, Bacteria SERRATIA MARCESCENS (A)  Final      Susceptibility   Serratia marcescens - MIC*    CEFAZOLIN >=64 RESISTANT Resistant     CEFEPIME <=0.12 SENSITIVE Sensitive     CEFTRIAXONE <=0.25 SENSITIVE Sensitive     CIPROFLOXACIN <=0.25 SENSITIVE Sensitive     GENTAMICIN <=1 SENSITIVE Sensitive     NITROFURANTOIN 128 RESISTANT Resistant     TRIMETH/SULFA <=20 SENSITIVE Sensitive     * 70,000 COLONIES/mL SERRATIA MARCESCENS   Resp Panel by RT-PCR (Flu A&B, Covid) Nasopharyngeal Swab     Status: None   Collection Time: 03/11/20  1:35 PM   Specimen: Nasopharyngeal Swab; Nasopharyngeal(NP) swabs in vial transport medium  Result Value Ref Range Status   SARS Coronavirus 2 by RT PCR NEGATIVE NEGATIVE Final    Comment: (NOTE) SARS-CoV-2 target nucleic acids are NOT DETECTED.  The SARS-CoV-2 RNA is generally detectable in upper respiratory specimens during the acute phase of infection. The lowest concentration of SARS-CoV-2 viral copies this assay can detect is 138 copies/mL. A negative result does not preclude SARS-Cov-2 infection and should not be used as the sole basis  for treatment or other patient management decisions. A negative result may occur with  improper specimen collection/handling, submission of specimen other than nasopharyngeal swab, presence of viral mutation(s) within the areas targeted by this assay, and inadequate number of viral copies(<138 copies/mL). A negative result must be combined with clinical observations, patient history, and epidemiological information. The expected result is Negative.  Fact Sheet for Patients:  BloggerCourse.com  Fact Sheet for Healthcare Providers:  SeriousBroker.it  This test is no t yet approved or cleared by the [place] FDA and  has been authorized for detection and/or diagnosis of SARS-CoV-2 by FDA under an Emergency Use Authorization (EUA). This EUA will remain  in effect (meaning this test can be used) for the duration of the COVID-19 declaration under Section 564(b)(1) of the Act, 21 U.S.C.section 360bbb-3(b)(1), unless the authorization is terminated  or revoked sooner.       Influenza A by PCR NEGATIVE NEGATIVE Final   Influenza B by PCR NEGATIVE NEGATIVE Final    Comment: (NOTE) The Xpert Xpress SARS-CoV-2/FLU/RSV plus assay is intended as an aid in the diagnosis of influenza from  Nasopharyngeal swab specimens and should not be used as a sole basis for treatment. Nasal washings and aspirates are unacceptable for Xpert Xpress SARS-CoV-2/FLU/RSV testing.  Fact Sheet for Patients: BloggerCourse.com  Fact Sheet for Healthcare Providers: SeriousBroker.it  This test is not yet approved or cleared by the [place] FDA and has been authorized for detection and/or diagnosis of SARS-CoV-2 by FDA under an Emergency Use Authorization (EUA). This EUA will remain in effect (meaning this test can be used) for the duration of the COVID-19 declaration under Section 564(b)(1) of the Act, 21 U.S.C. section 360bbb-3(b)(1), unless the authorization is terminated or revoked.  Performed at [facility], [address] [place], [place] [address]   CULTURE, BLOOD (ROUTINE X 2) w Reflex to ID Panel     Status: None (Preliminary result)   Collection Time: 03/11/20  2:12 PM   Specimen: BLOOD  Result Value Ref Range Status   Specimen Description BLOOD BLOOD LEFT FOREARM  Final   Special Requests   Final    BOTTLES DRAWN AEROBIC AND ANAEROBIC Blood Culture results may not be optimal due to an inadequate volume of blood received in culture bottles   Culture   Final    NO GROWTH 4 DAYS Performed at [facility], [address], [place], [place] [address]    Report Status PENDING  Incomplete  CULTURE, BLOOD (ROUTINE X 2) w Reflex to ID Panel     Status: None (Preliminary result)   Collection Time: 03/11/20  2:12 PM   Specimen: BLOOD  Result Value Ref Range Status   Specimen Description BLOOD BLOOD RIGHT FOREARM  Final   Special Requests   Final    BOTTLES DRAWN AEROBIC AND ANAEROBIC Blood Culture results may not be optimal due to an inadequate volume of blood received in culture bottles   Culture   Final    NO GROWTH 4 DAYS Performed at [facility], [address] [place], [place] [address]     Report Status PENDING  Incomplete  Resp Panel by RT-PCR (Flu A&B, Covid) Nasopharyngeal Swab     Status: None   Collection Time: 03/14/20  3:02 PM   Specimen: Nasopharyngeal Swab; Nasopharyngeal(NP) swabs in vial transport medium  Result Value Ref Range Status   SARS Coronavirus 2 by RT PCR NEGATIVE NEGATIVE Final    Comment: (NOTE) SARS-CoV-2 target nucleic  acids are NOT DETECTED.  The SARS-CoV-2 RNA is generally detectable in upper respiratory specimens during the acute phase of infection. The lowest concentration of SARS-CoV-2 viral copies this assay can detect is 138 copies/mL. A negative result does not preclude SARS-Cov-2 infection and should not be used as the sole basis for treatment or other patient management decisions. A negative result may occur with  improper specimen collection/handling, submission of specimen other than nasopharyngeal swab, presence of viral mutation(s) within the areas targeted by this assay, and inadequate number of viral copies(<138 copies/mL). A negative result must be combined with clinical observations, patient history, and epidemiological information. The expected result is Negative.  Fact Sheet for Patients:  BloggerCourse.com  Fact Sheet for Healthcare Providers:  SeriousBroker.it  This test is no t yet approved or cleared by the [place] FDA and  has been authorized for detection and/or diagnosis of SARS-CoV-2 by FDA under an Emergency Use Authorization (EUA). This EUA will remain  in effect (meaning this test can be used) for the duration of the COVID-19 declaration under Section 564(b)(1) of the Act, 21 U.S.C.section 360bbb-3(b)(1), unless the authorization is terminated  or revoked sooner.       Influenza A by PCR NEGATIVE NEGATIVE Final   Influenza B by PCR NEGATIVE NEGATIVE Final    Comment: (NOTE) The Xpert Xpress SARS-CoV-2/FLU/RSV plus assay is intended as an aid in the  diagnosis of influenza from Nasopharyngeal swab specimens and should not be used as a sole basis for treatment. Nasal washings and aspirates are unacceptable for Xpert Xpress SARS-CoV-2/FLU/RSV testing.  Fact Sheet for Patients: BloggerCourse.com  Fact Sheet for Healthcare Providers: SeriousBroker.it  This test is not yet approved or cleared by the [place] FDA and has been authorized for detection and/or diagnosis of SARS-CoV-2 by FDA under an Emergency Use Authorization (EUA). This EUA will remain in effect (meaning this test can be used) for the duration of the COVID-19 declaration under Section 564(b)(1) of the Act, 21 U.S.C. section 360bbb-3(b)(1), unless the authorization is terminated or revoked.  Performed at [facility], [address], [place], [place] [address]          Radiology Studies: No results found.      Scheduled Meds: . benztropine  2 mg Oral BID  . busPIRone  15 mg Oral BID  . cholecalciferol  2,000 Units Oral Daily  . cloZAPine  100 mg Oral Daily  . cloZAPine  200 mg Oral QHS  . divalproex  1,000 mg Oral QHS  . enoxaparin (LOVENOX) injection  40 mg Subcutaneous Q24H  . gabapentin  200 mg Oral QHS  . levothyroxine  75 mcg Oral QAC breakfast  . nicotine  21 mg Transdermal Daily  . pantoprazole  40 mg Oral Daily  . tamsulosin  0.4 mg Oral BID  . vitamin B-12  100 mcg Oral Daily   Continuous Infusions: . sodium chloride 100 mL/hr at 03/15/20 0611  . sodium chloride Stopped (03/13/20 0837)  . cefTRIAXone (ROCEPHIN)  IV Stopped (03/14/20 0946)     LOS: 4 days    Time spent: 30 mins    Charise Killian, MD [facility] Pager [phone]  If 7PM-7AM, please contact night-coverage 03/15/2020, 7:53 AM

## 2020-03-16 DIAGNOSIS — N3000 Acute cystitis without hematuria: Secondary | ICD-10-CM | POA: Diagnosis not present

## 2020-03-16 DIAGNOSIS — R531 Weakness: Secondary | ICD-10-CM | POA: Diagnosis not present

## 2020-03-16 DIAGNOSIS — F250 Schizoaffective disorder, bipolar type: Secondary | ICD-10-CM | POA: Diagnosis not present

## 2020-03-16 LAB — BASIC METABOLIC PANEL
Anion gap: 7 (ref 5–15)
BUN: 16 mg/dL (ref 8–23)
CO2: 26 mmol/L (ref 22–32)
Calcium: 8.4 mg/dL — ABNORMAL LOW (ref 8.9–10.3)
Chloride: 103 mmol/L (ref 98–111)
Creatinine, Ser: 1.03 mg/dL (ref 0.61–1.24)
GFR, Estimated: >60 mL/min (ref >60)
Glucose, Bld: 99 mg/dL (ref 70–99)
Potassium: 4 mmol/L (ref 3.5–5.1)
Sodium: 136 mmol/L (ref 135–145)

## 2020-03-16 LAB — CULTURE, BLOOD (ROUTINE X 2)
Culture: NO GROWTH
Culture: NO GROWTH

## 2020-03-16 LAB — CBC
HCT: 34.5 % — ABNORMAL LOW (ref 39.0–52.0)
Hemoglobin: 12 g/dL — ABNORMAL LOW (ref 13.0–17.0)
MCH: 33.8 pg (ref 26.0–34.0)
MCHC: 34.8 g/dL (ref 30.0–36.0)
MCV: 97.2 fL (ref 80.0–100.0)
Platelets: 151 10*3/uL (ref 150–400)
RBC: 3.55 MIL/uL — ABNORMAL LOW (ref 4.22–5.81)
RDW: 15 % (ref 11.5–15.5)
WBC: 12.5 10*3/uL — ABNORMAL HIGH (ref 4.0–10.5)
nRBC: 0 % (ref 0.0–0.2)

## 2020-03-16 NOTE — TOC Progression Note (Signed)
Transition of Care [facility]) - Progression Note    Patient Details  Name: Austin Marsh MRN: [id number] Date of Birth: [birth year]  Transition [facility]) CM/SW Contact  Delphia Grates, [place] Phone Number: 03/16/2020, 9:26 AM  Clinical Narrative:     CSW contacted Tammy with [facility] about bed availability. Tammy stated that there would have been beds this weekend but current residents appealed and won. Tammy reports some bed availability on Monday, Nov 29th.   Expected Discharge Plan: Skilled Nursing Facility    Expected Discharge Plan and Services Expected Discharge Plan: Skilled Nursing Facility       Living arrangements for the past 2 months: Group Home                                       Social Determinants of Health (SDOH) Interventions    Readmission Risk Interventions No flowsheet data found.

## 2020-03-16 NOTE — Progress Notes (Signed)
Physical Therapy Treatment Patient Details Name: Austin Marsh MRN: [id number] DOB: [birth year] Today's Date: 03/16/2020    History of Present Illness Pt is 63 y/o M with PMH: Schizoaffective disorder, bipolar type, COPD, and hypothyroidism. Pt presented to ER secondary to fall, increasing urinary frequency; sepsis secondary to UTI.    PT Comments    Ready for session.  Participated in exercises as described below.  Pt is able to get to EOB with max cues for sequencing and encouragement.  Initially tremors are noted but subside after several moments.  Stated they are new for him since being "sick."  He is able to sit x 1 0 minutes before fatigue.  BP's taken and generally stable in supine and sitting but he does c/o dizziness through out.  He is unable to rate but when asked if he feels he will pass out he laughs and says no.  He is encouraged to attempt lateral scooting to [facility] before laying back down but is unable to clear his hips for any functional progress.  Unable to stand with +1 assist.  He returns to supine with min guard/assist and able to scoot in supine to [facility] using headboard.  Generally fatigued with session.   Follow Up Recommendations  SNF     Equipment Recommendations  Rolling walker with 5" wheels    Recommendations for Other Services       Precautions / Restrictions Precautions Precautions: Fall Restrictions Weight Bearing Restrictions: No    Mobility  Bed Mobility   Bed Mobility: Supine to Sit;Sit to Supine     Supine to sit: Min assist Sit to supine: Min assist   General bed mobility comments: max verbal cues for motivation and sequencing  Transfers Overall transfer level: Needs assistance Equipment used: None Transfers: Lateral/Scoot Transfers Sit to Stand: Total assist        Lateral/Scoot Transfers: Max assist General transfer comment: unable to stand despite assist or functionally lateral scoot in sitting along EOB  Ambulation/Gait              General Gait Details: unsafe/unable   Stairs             Wheelchair Mobility    Modified Rankin (Stroke Patients Only)       Balance Overall balance assessment: Needs assistance Sitting-balance support: No upper extremity supported;Feet supported Sitting balance-Leahy Scale: Fair Sitting balance - Comments: supervision for safety and tremors initially       Standing balance comment: unable to stand today                            Cognition Arousal/Alertness: Awake/alert Behavior During Therapy: WFL for tasks assessed/performed Overall Cognitive Status: Within Functional Limits for tasks assessed                                        Exercises Other Exercises Other Exercises: supine 1 x 10 for ankle pumps, heel slides, ab/add and SLR A/AAROM    General Comments        Pertinent Vitals/Pain Pain Assessment: No/denies pain    Home Living                      Prior Function            PT Goals (current goals can now be found in the care  plan section) Progress towards PT goals: Not progressing toward goals - comment    Frequency    Min 2X/week      PT Plan Current plan remains appropriate    Co-evaluation              AM-PAC PT "6 Clicks" Mobility   Outcome Measure  Help needed turning from your back to your side while in a flat bed without using bedrails?: A Lot Help needed moving from lying on your back to sitting on the side of a flat bed without using bedrails?: A Lot Help needed moving to and from a bed to a chair (including a wheelchair)?: Total Help needed standing up from a chair using your arms (e.g., wheelchair or bedside chair)?: Total Help needed to walk in hospital room?: Total Help needed climbing 3-5 steps with a railing? : Total 6 Click Score: 8    End of Session Equipment Utilized During Treatment: Gait belt Activity Tolerance: Patient tolerated treatment well Patient  left: in bed;with call bell/phone within reach;with bed alarm set Nurse Communication: Mobility status       Time: 8295-6213 PT Time Calculation (min) (ACUTE ONLY): 25 min  Charges:  $Therapeutic Exercise: 8-22 mins $Therapeutic Activity: 8-22 mins                   Danielle Dess, PTA 03/16/20, 11:38 AM

## 2020-03-16 NOTE — Progress Notes (Signed)
PROGRESS NOTE    Austin BailiffRobin Marsh  VWU:[id number] DOB: 01-28-1956 DOA: 03/11/2020 PCP: Austin Marsh   Assessment & Plan:   Principal Problem:   UTI (urinary tract infection) Active Problems:   AKI (acute kidney injury) (HCC)   Sepsis (HCC)   COPD (chronic obstructive pulmonary disease) (HCC)   Hypothyroidism   Depression with anxiety   Schizoaffective disorder, bipolar type (HCC)   Tobacco abuse   Fall   Generalized weakness   Sepsis: secondary to UTI. Present on admission & meets criteria with leukocytosis, tachycardia. Continue on IVFs, IV abxs. Blood cxs NGTD. Resolved  UTI: UA is positive. Urine cx is growing serratia marcescens. Continue on IV ceftriaxone while inpatient and switch to po abxs at d/c.  Leukocytosis: labile. Will continue to monitor   Thrombocytopenia: resolved   Hypokalemia: WNL today   AKI: resolved   Generalized weakness: PT recs SNF. Been accepted at [facility] but still no beds available today   COPD: w/o exacerbation. Continue on bronchodilators and encourage incentive spirometry   Hypothyroidism: continue on home dose of synthroid   Depression: severity unknown. Continue on home dose of buspar  Schizoaffective disorder, bipolar type : No SI or HI. Unknown type or severity. Continue on home dose of cogentin, clozapine, depakote   Tobacco abuse: smoking cessation counseling. Nicotine patch to prevent w/drawal    DVT prophylaxis: lovenox  Code Status: full  Family Communication: Disposition Plan: likely d/c to SNF   Status is: Inpatient  Remains inpatient appropriate because:Unsafe d/c plan, awaiting bed placement at [facility] still   Dispo: The patient is from: Home              Anticipated d/c is to: SNF              Anticipated d/c date is: 1 day               Patient currently is medically stable to d/c.      Consultants:     Procedures:    Antimicrobials: ceftriaxone   Subjective: Pt c/o  fatigue  Objective: Vitals:   03/15/20 1505 03/15/20 2016 03/15/20 2345 03/16/20 0622  BP: 115/76 132/69 131/74 118/76  Pulse: 86 83 82 74  Resp: 18 20 16 20   Temp: 97.8 F (36.6 C) 97.6 F (36.4 C) (!) 97.5 F (36.4 C) 98.1 F (36.7 C)  TempSrc:  Oral Oral   SpO2: 98% 97% 95% 93%  Weight:      Height:        Intake/Output Summary (Last 24 hours) at 03/16/2020 0739 Last data filed at 03/16/2020 0537 Gross per 24 hour  Intake 1050 ml  Output 2225 ml  Net -1175 ml   Filed Weights   03/11/20 1106  Weight: 81.6 kg    Examination:  General exam: Appears calm & comfortable  Respiratory system: decreased breath sounds b/l  Cardiovascular system: S1/S2+. No rubs or gallops Gastrointestinal system: Abdomen is nondistended, soft and nontender. Hypoactive bowel sounds heard. Central nervous system: Alert and oriented. Moves all 4 extremities   Psychiatry: Judgement and insight appear normal. Flat mood and affect    Data Reviewed: I have personally reviewed following labs and imaging studies  CBC: Recent Labs  Lab 03/11/20 1220 03/11/20 1220 03/12/20 0512 03/13/20 0524 03/14/20 0627 03/15/20 0536 03/16/20 0435  WBC 32.1*   < > 28.9* 20.5* 11.4* 10.2 12.5*  NEUTROABS 25.7*  --   --   --   --   --   --  HGB 13.1   < > 11.7* 11.2* 11.4* 11.6* 12.0*  HCT 38.4*   < > 35.1* 32.6* 33.6* 34.2* 34.5*  MCV 98.5   < > 98.9 98.5 98.2 97.2 97.2  PLT 125*   < > 107* 96* 116* 120* 151   < > = values in this interval not displayed.   Basic Metabolic Panel: Recent Labs  Lab 03/12/20 0924 03/13/20 0524 03/14/20 0627 03/15/20 0536 03/16/20 0435  NA 140 140 140 137 136  K 3.6 3.2* 4.0 4.0 4.0  CL 107 108 110 106 103  CO2 22 25 22 24 26   GLUCOSE 99 85 93 98 99  BUN 18 16 14 14 16   CREATININE 1.24 1.03 0.97 1.04 1.03  CALCIUM 8.3* 8.1* 8.1* 8.1* 8.4*   GFR: Estimated Creatinine Clearance: 70.5 mL/min (by C-G formula based on SCr of 1.03 mg/dL). Liver Function  Tests: Recent Labs  Lab 03/11/20 1220  AST 18  ALT 8  ALKPHOS 59  BILITOT 1.2  PROT 6.4*  ALBUMIN 3.1*   No results for input(s): LIPASE, AMYLASE in the last 168 hours. No results for input(s): AMMONIA in the last 168 hours. Coagulation Profile: No results for input(s): INR, PROTIME in the last 168 hours. Cardiac Enzymes: No results for input(s): CKTOTAL, CKMB, CKMBINDEX, TROPONINI in the last 168 hours. BNP (last 3 results) No results for input(s): PROBNP in the last 8760 hours. HbA1C: No results for input(s): HGBA1C in the last 72 hours. CBG: No results for input(s): GLUCAP in the last 168 hours. Lipid Profile: No results for input(s): CHOL, HDL, LDLCALC, TRIG, CHOLHDL, LDLDIRECT in the last 72 hours. Thyroid Function Tests: No results for input(s): TSH, T4TOTAL, FREET4, T3FREE, THYROIDAB in the last 72 hours. Anemia Panel: No results for input(s): VITAMINB12, FOLATE, FERRITIN, TIBC, IRON, RETICCTPCT in the last 72 hours. Sepsis Labs: Recent Labs  Lab 03/11/20 1412  PROCALCITON 0.30  LATICACIDVEN 1.3    Recent Results (from the past 240 hour(s))  Urine culture     Status: Abnormal   Collection Time: 03/11/20 12:28 PM   Specimen: Urine, Random  Result Value Ref Range Status   Specimen Description   Final    URINE, RANDOM Performed at [facility], [address] [place], [address] [place]    Special Requests   Final    NONE Performed at [facility], [address] [place], [address] [place]    Culture 70,000 COLONIES/mL SERRATIA MARCESCENS (A)  Final   Report Status 03/14/2020 FINAL  Final   Organism ID, Bacteria SERRATIA MARCESCENS (A)  Final      Susceptibility   Serratia marcescens - MIC*    CEFAZOLIN >=64 RESISTANT Resistant     CEFEPIME <=0.12 SENSITIVE Sensitive     CEFTRIAXONE <=0.25 SENSITIVE Sensitive     CIPROFLOXACIN <=0.25 SENSITIVE Sensitive     GENTAMICIN <=1 SENSITIVE Sensitive     NITROFURANTOIN 128 RESISTANT  Resistant     TRIMETH/SULFA <=20 SENSITIVE Sensitive     * 70,000 COLONIES/mL SERRATIA MARCESCENS  Resp Panel by RT-PCR (Flu A&B, Covid) Nasopharyngeal Swab     Status: None   Collection Time: 03/11/20  1:35 PM   Specimen: Nasopharyngeal Swab; Nasopharyngeal(Marsh) swabs in vial transport medium  Result Value Ref Range Status   SARS Coronavirus 2 by RT PCR NEGATIVE NEGATIVE Final    Comment: (NOTE) SARS-CoV-2 target nucleic acids are NOT DETECTED.  The SARS-CoV-2 RNA is generally detectable in upper respiratory specimens during the acute phase  of infection. The lowest concentration of SARS-CoV-2 viral copies this assay can detect is 138 copies/mL. A negative result does not preclude SARS-Cov-2 infection and should not be used as the sole basis for treatment or other patient management decisions. A negative result may occur with  improper specimen collection/handling, submission of specimen other than nasopharyngeal swab, presence of viral mutation(s) within the areas targeted by this assay, and inadequate number of viral copies(<138 copies/mL). A negative result must be combined with clinical observations, patient history, and epidemiological information. The expected result is Negative.  Fact Sheet for Patients:  BloggerCourse.com  Fact Sheet for Healthcare Providers:  SeriousBroker.it  This test is no t yet approved or cleared by the [place] FDA and  has been authorized for detection and/or diagnosis of SARS-CoV-2 by FDA under an Emergency Use Authorization (EUA). This EUA will remain  in effect (meaning this test can be used) for the duration of the COVID-19 declaration under Section 564(b)(1) of the Act, 21 U.S.C.section 360bbb-3(b)(1), unless the authorization is terminated  or revoked sooner.       Influenza A by PCR NEGATIVE NEGATIVE Final   Influenza B by PCR NEGATIVE NEGATIVE Final    Comment: (NOTE) The Xpert  Xpress SARS-CoV-2/FLU/RSV plus assay is intended as an aid in the diagnosis of influenza from Nasopharyngeal swab specimens and should not be used as a sole basis for treatment. Nasal washings and aspirates are unacceptable for Xpert Xpress SARS-CoV-2/FLU/RSV testing.  Fact Sheet for Patients: BloggerCourse.com  Fact Sheet for Healthcare Providers: SeriousBroker.it  This test is not yet approved or cleared by the [place] FDA and has been authorized for detection and/or diagnosis of SARS-CoV-2 by FDA under an Emergency Use Authorization (EUA). This EUA will remain in effect (meaning this test can be used) for the duration of the COVID-19 declaration under Section 564(b)(1) of the Act, 21 U.S.C. section 360bbb-3(b)(1), unless the authorization is terminated or revoked.  Performed at [facility], [address] [place], [place] [address]   CULTURE, BLOOD (ROUTINE X 2) w Reflex to ID Panel     Status: None   Collection Time: 03/11/20  2:12 PM   Specimen: BLOOD  Result Value Ref Range Status   Specimen Description BLOOD BLOOD LEFT FOREARM  Final   Special Requests   Final    BOTTLES DRAWN AEROBIC AND ANAEROBIC Blood Culture results may not be optimal due to an inadequate volume of blood received in culture bottles   Culture   Final    NO GROWTH 5 DAYS Performed at [facility], [address] [place], [place] [address]    Report Status 03/16/2020 FINAL  Final  CULTURE, BLOOD (ROUTINE X 2) w Reflex to ID Panel     Status: None   Collection Time: 03/11/20  2:12 PM   Specimen: BLOOD  Result Value Ref Range Status   Specimen Description BLOOD BLOOD RIGHT FOREARM  Final   Special Requests   Final    BOTTLES DRAWN AEROBIC AND ANAEROBIC Blood Culture results may not be optimal due to an inadequate volume of blood received in culture bottles   Culture   Final    NO GROWTH 5 DAYS Performed at [facility], [address], [place], [place] [address]    Report Status 03/16/2020 FINAL  Final  Resp Panel by RT-PCR (Flu A&B, Covid) Nasopharyngeal Swab     Status: None   Collection Time: 03/14/20  3:02 PM   Specimen: Nasopharyngeal Swab;  Nasopharyngeal(Marsh) swabs in vial transport medium  Result Value Ref Range Status   SARS Coronavirus 2 by RT PCR NEGATIVE NEGATIVE Final    Comment: (NOTE) SARS-CoV-2 target nucleic acids are NOT DETECTED.  The SARS-CoV-2 RNA is generally detectable in upper respiratory specimens during the acute phase of infection. The lowest concentration of SARS-CoV-2 viral copies this assay can detect is 138 copies/mL. A negative result does not preclude SARS-Cov-2 infection and should not be used as the sole basis for treatment or other patient management decisions. A negative result may occur with  improper specimen collection/handling, submission of specimen other than nasopharyngeal swab, presence of viral mutation(s) within the areas targeted by this assay, and inadequate number of viral copies(<138 copies/mL). A negative result must be combined with clinical observations, patient history, and epidemiological information. The expected result is Negative.  Fact Sheet for Patients:  BloggerCourse.com  Fact Sheet for Healthcare Providers:  SeriousBroker.it  This test is no t yet approved or cleared by the [place] FDA and  has been authorized for detection and/or diagnosis of SARS-CoV-2 by FDA under an Emergency Use Authorization (EUA). This EUA will remain  in effect (meaning this test can be used) for the duration of the COVID-19 declaration under Section 564(b)(1) of the Act, 21 U.S.C.section 360bbb-3(b)(1), unless the authorization is terminated  or revoked sooner.       Influenza A by PCR NEGATIVE NEGATIVE Final   Influenza B by PCR NEGATIVE NEGATIVE Final    Comment: (NOTE) The Xpert Xpress  SARS-CoV-2/FLU/RSV plus assay is intended as an aid in the diagnosis of influenza from Nasopharyngeal swab specimens and should not be used as a sole basis for treatment. Nasal washings and aspirates are unacceptable for Xpert Xpress SARS-CoV-2/FLU/RSV testing.  Fact Sheet for Patients: BloggerCourse.com  Fact Sheet for Healthcare Providers: SeriousBroker.it  This test is not yet approved or cleared by the [place] FDA and has been authorized for detection and/or diagnosis of SARS-CoV-2 by FDA under an Emergency Use Authorization (EUA). This EUA will remain in effect (meaning this test can be used) for the duration of the COVID-19 declaration under Section 564(b)(1) of the Act, 21 U.S.C. section 360bbb-3(b)(1), unless the authorization is terminated or revoked.  Performed at [facility], [address], [place], [place] [address]          Radiology Studies: No results found.      Scheduled Meds: . benztropine  2 mg Oral BID  . busPIRone  15 mg Oral BID  . cholecalciferol  2,000 Units Oral Daily  . cloZAPine  100 mg Oral Daily  . cloZAPine  200 mg Oral QHS  . divalproex  1,000 mg Oral QHS  . docusate sodium  200 mg Oral BID  . enoxaparin (LOVENOX) injection  40 mg Subcutaneous Q24H  . gabapentin  200 mg Oral QHS  . levothyroxine  75 mcg Oral QAC breakfast  . nicotine  21 mg Transdermal Daily  . pantoprazole  40 mg Oral Daily  . tamsulosin  0.4 mg Oral BID  . vitamin B-12  100 mcg Oral Daily   Continuous Infusions: . sodium chloride Stopped (03/13/20 0837)  . cefTRIAXone (ROCEPHIN)  IV 1 g (03/15/20 1110)     LOS: 5 days    Time spent: 31 mins    Charise Killian, MD [facility] Pager [phone]  If 7PM-7AM, please contact night-coverage 03/16/2020, 7:39 AM

## 2020-03-17 DIAGNOSIS — R531 Weakness: Secondary | ICD-10-CM | POA: Diagnosis not present

## 2020-03-17 DIAGNOSIS — N3000 Acute cystitis without hematuria: Secondary | ICD-10-CM | POA: Diagnosis not present

## 2020-03-17 DIAGNOSIS — F250 Schizoaffective disorder, bipolar type: Secondary | ICD-10-CM | POA: Diagnosis not present

## 2020-03-17 LAB — BASIC METABOLIC PANEL
Anion gap: 7 (ref 5–15)
BUN: 17 mg/dL (ref 8–23)
CO2: 29 mmol/L (ref 22–32)
Calcium: 8.5 mg/dL — ABNORMAL LOW (ref 8.9–10.3)
Chloride: 100 mmol/L (ref 98–111)
Creatinine, Ser: 1.01 mg/dL (ref 0.61–1.24)
GFR, Estimated: >60 mL/min (ref >60)
Glucose, Bld: 118 mg/dL — ABNORMAL HIGH (ref 70–99)
Potassium: 4.1 mmol/L (ref 3.5–5.1)
Sodium: 136 mmol/L (ref 135–145)

## 2020-03-17 LAB — CBC
HCT: 33.5 % — ABNORMAL LOW (ref 39.0–52.0)
Hemoglobin: 11.4 g/dL — ABNORMAL LOW (ref 13.0–17.0)
MCH: 33.1 pg (ref 26.0–34.0)
MCHC: 34 g/dL (ref 30.0–36.0)
MCV: 97.4 fL (ref 80.0–100.0)
Platelets: 179 10*3/uL (ref 150–400)
RBC: 3.44 MIL/uL — ABNORMAL LOW (ref 4.22–5.81)
RDW: 14.7 % (ref 11.5–15.5)
WBC: 17 10*3/uL — ABNORMAL HIGH (ref 4.0–10.5)
nRBC: 0 % (ref 0.0–0.2)

## 2020-03-17 LAB — RESP PANEL BY RT-PCR (FLU A&B, COVID) ARPGX2
Influenza A by PCR: NEGATIVE
Influenza B by PCR: NEGATIVE
SARS Coronavirus 2 by RT PCR: NEGATIVE

## 2020-03-17 MED ORDER — CIPROFLOXACIN HCL 500 MG PO TABS: 500.0000 mg | ORAL_TABLET | Freq: Two times a day (BID) | ORAL | 0 refills | Status: AC

## 2020-03-17 NOTE — Discharge Summary (Signed)
Physician Discharge Summary  Austin Marsh RSW:[id number] DOB: 05-06-1955 DOA: 03/11/2020  PCP: Koren Bound, NP  Admit date: 03/11/2020 Discharge date: 03/17/2020  Admitted From: home Disposition:  SNF  Recommendations for Outpatient Follow-up:  1. Follow up with PCP in 1-2 weeks   Home Health: no Equipment/Devices:  Discharge Condition: stable CODE STATUS: full  Diet recommendation: regular  Brief/Interim Summary: HPI was taken from Dr. Clyde Lundborg: Austin Marsh is a 64 y.o. male with medical history significant of COPD, hypothyroidism, depression, anxiety, schizophrenia, OSA, tardive dyskinesia, tobacco abuse, who presents with a fall and increased urinary frequency.  Patient states that he has history of UTI and feeling like he needs to urinate more frequently in the past 2 days.  He also has dysuria, no burning on urination.  He has generalized weakness.  Patient states that he has to get up several times overnight to urinate because of increased urinary frequency, and fell several times. No LOC.  No head or neck injury.  Patient denies nausea, vomiting, diarrhea, abdominal pain.  No chest pain, cough, shortness breath.  No fever or chills. He has pain in the left foot and left hip after fall.  ED Course: pt was found to have WBC 32.1, positive urinalysis (cloudy appearance, moderate amount of leukocyte, rare bacteria and WBC> 50), pending COVID-19 PCR, AKI with creatinine 1.79, BUN 20 (creatinine 1.16 on 01/23/2020), temperature normal, blood pressure 101/61, heart rate 100, RR 16, oxygen saturation 94% on room air.  CT head is negative for acute intracranial abnormalities.  CT of C-spine is negative for bony fracture, but showed a degenerative disc disease.  Negative x-ray for bony fracture of left foot and the left hip/pelvis.  Patient is placed on MedSurg bed for observation.  CXR showed:  1. Interval clearing of right upper lung infiltrate. Mild infiltrate left upper lung cannot  be excluded. Chronic interstitial changes again noted. 2. No acute bony abnormality identified. No pneumothorax.   Hospital Course from Dr. Wilfred Lacy 03/12/20-03/17/20: Pt presented w/ sepsis secondary to UTI. Urine cx grew serratia marcescens and pt received IV ceftriaxone while inpatient and will be d/c w/ po cipro based off of urine cx sens. Sepsis resolved prior to d/c. Of note, PT/OT saw the pt and recommended SNF. For more information, please see previous progress notes.   Discharge Diagnoses:  Principal Problem:   UTI (urinary tract infection) Active Problems:   AKI (acute kidney injury) (HCC)   Sepsis (HCC)   COPD (chronic obstructive pulmonary disease) (HCC)   Hypothyroidism   Depression with anxiety   Schizoaffective disorder, bipolar type (HCC)   Tobacco abuse   Fall   Generalized weakness  Sepsis: secondary to UTI. Present on admission & meets criteria with leukocytosis, tachycardia. Continue on IVFs, IV abxs. Blood cxs NGTD. Resolved  UTI: UA is positive. Urine cx is growing serratia marcescens. Continue on IV ceftriaxone while inpatient and switch to po cipro at d/c   Leukocytosis: labile. Will continue to monitor   Thrombocytopenia: resolved   Hypokalemia: WNL today   AKI: resolved   Generalized weakness: PT recs SNF.   COPD: w/o exacerbation. Continue on bronchodilators and encourage incentive spirometry   Hypothyroidism: continue on home dose of synthroid   Depression: severity unknown. Continue on home dose of buspar  Schizoaffective disorder, bipolar type : No SI or HI. Unknown type or severity. Continue on home dose of cogentin, clozapine, depakote   Tobacco abuse: smoking cessation counseling. Nicotine patch to prevent w/drawal   Discharge  Instructions  Discharge Instructions    Diet general   Complete by: As directed    Discharge instructions   Complete by: As directed    F/u w/ PCP in 1-2 weeks   Increase activity slowly   Complete  by: As directed      Allergies as of 03/17/2020      Reactions   Haldol [haloperidol Lactate] Other (See Comments)   Tardive diskonesia   Other    Navy Beans cause patient to vomit   Thorazine [chlorpromazine] Other (See Comments)   "messes me all up"   Trazodone And Nefazodone Other (See Comments)   "makes me feel like I'm dying."      Medication List    STOP taking these medications   polyethylene glycol 17 g packet Commonly known as: MIRALAX / GLYCOLAX     TAKE these medications   acetaminophen 325 MG tablet Commonly known as: TYLENOL Take 650 mg by mouth every 4 (four) hours as needed.   albuterol 108 (90 Base) MCG/ACT inhaler Commonly known as: VENTOLIN HFA Inhale 2 puffs into the lungs every 4 (four) hours as needed for wheezing or shortness of breath.   alum & mag hydroxide-simeth 200-200-20 MG/5ML suspension Commonly known as: MAALOX/MYLANTA Take 30 mLs by mouth daily as needed for indigestion or heartburn.   benztropine 2 MG tablet Commonly known as: COGENTIN Take 2 mg by mouth 2 (two) times daily.   bismuth subsalicylate 262 MG/15ML suspension Commonly known as: PEPTO BISMOL Take 15 mLs by mouth every 4 (four) hours as needed.   busPIRone 15 MG tablet Commonly known as: BUSPAR Take 15 mg by mouth 2 (two) times daily.   ciprofloxacin 500 MG tablet Commonly known as: Cipro Take 1 tablet (500 mg total) by mouth 2 (two) times daily for 3 days.   cloZAPine 100 MG tablet Commonly known as: CLOZARIL Take 100-200 mg by mouth daily. Take 100 mg in the morning and 200 mg at bedtime for scizophrenia   divalproex 500 MG 24 hr tablet Commonly known as: DEPAKOTE ER Take 1,000 mg by mouth at bedtime.   gabapentin 100 MG capsule Commonly known as: NEURONTIN Take 200 mg by mouth at bedtime.   guaiFENesin 100 MG/5ML liquid Commonly known as: ROBITUSSIN Take 200 mg by mouth every 6 (six) hours as needed for cough.   levothyroxine 75 MCG tablet Commonly  known as: SYNTHROID Take 75 mcg by mouth daily before breakfast.   loperamide 2 MG capsule Commonly known as: IMODIUM Take 2 mg by mouth as needed for diarrhea or loose stools.   magnesium hydroxide 400 MG/5ML suspension Commonly known as: MILK OF MAGNESIA Take 30 mLs by mouth daily as needed for mild constipation.   neomycin-bacitracin-polymyxin ointment Commonly known as: NEOSPORIN Apply 1 application topically as needed for wound care.   omeprazole 40 MG capsule Commonly known as: PRILOSEC Take 40 mg by mouth daily.   pantoprazole 40 MG tablet Commonly known as: PROTONIX Take 40 mg by mouth daily.   tamsulosin 0.4 MG Caps capsule Commonly known as: FLOMAX Take 0.4 mg by mouth 2 (two) times daily.   vitamin B-12 100 MCG tablet Commonly known as: CYANOCOBALAMIN Take 100 mcg by mouth daily.   Vitamin D (Cholecalciferol) 25 MCG (1000 UT) Tabs Take 50 mcg by mouth daily.       Contact information for after-discharge care    Destination    [facility] Preferred SNF .   Service: Skilled Nursing Contact information:  [address] [place] [address] [phone]                 Allergies  Allergen Reactions  . Haldol [Haloperidol Lactate] Other (See Comments)    Tardive diskonesia  . Other     Navy Beans cause patient to vomit  . Thorazine [Chlorpromazine] Other (See Comments)    "messes me all up"  . Trazodone And Nefazodone Other (See Comments)    "makes me feel like I'm dying."    Consultations:     Procedures/Studies: CT Head Wo Contrast  Result Date: 03/11/2020 CLINICAL DATA:  The patient suffered a recent fall. EXAM: CT HEAD WITHOUT CONTRAST CT CERVICAL SPINE WITHOUT CONTRAST TECHNIQUE: Multidetector CT imaging of the head and cervical spine was performed following the standard protocol without intravenous contrast. Multiplanar CT image reconstructions of the cervical spine were also generated. COMPARISON:  Head  and cervical spine CT scan 06/21/2018. Head CT 01/23/2020. FINDINGS: CT HEAD FINDINGS Brain: No evidence of acute infarction, hemorrhage, hydrocephalus, extra-axial collection or mass lesion/mass effect. Cortical atrophy again seen. Vascular: No hyperdense vessel or unexpected calcification. Skull: Intact.  No focal lesion. Sinuses/Orbits: Negative. Other: None. CT CERVICAL SPINE FINDINGS Alignment: Maintained. Skull base and vertebrae: No acute fracture. No primary bone lesion or focal pathologic process. Soft tissues and spinal canal: No prevertebral fluid or swelling. No visible canal hematoma. Disc levels: Mild loss of disc space height and bulging C3-4 and C5-6. Upper chest: Negative. Other: None. IMPRESSION: No acute abnormality head or cervical spine. Cortical atrophy. Mild cervical degenerative change. Electronically Signed   By: Drusilla Kanner M.D.   On: 03/11/2020 12:14   CT Cervical Spine Wo Contrast  Result Date: 03/11/2020 CLINICAL DATA:  The patient suffered a recent fall. EXAM: CT HEAD WITHOUT CONTRAST CT CERVICAL SPINE WITHOUT CONTRAST TECHNIQUE: Multidetector CT imaging of the head and cervical spine was performed following the standard protocol without intravenous contrast. Multiplanar CT image reconstructions of the cervical spine were also generated. COMPARISON:  Head and cervical spine CT scan 06/21/2018. Head CT 01/23/2020. FINDINGS: CT HEAD FINDINGS Brain: No evidence of acute infarction, hemorrhage, hydrocephalus, extra-axial collection or mass lesion/mass effect. Cortical atrophy again seen. Vascular: No hyperdense vessel or unexpected calcification. Skull: Intact.  No focal lesion. Sinuses/Orbits: Negative. Other: None. CT CERVICAL SPINE FINDINGS Alignment: Maintained. Skull base and vertebrae: No acute fracture. No primary bone lesion or focal pathologic process. Soft tissues and spinal canal: No prevertebral fluid or swelling. No visible canal hematoma. Disc levels: Mild loss of  disc space height and bulging C3-4 and C5-6. Upper chest: Negative. Other: None. IMPRESSION: No acute abnormality head or cervical spine. Cortical atrophy. Mild cervical degenerative change. Electronically Signed   By: Drusilla Kanner M.D.   On: 03/11/2020 12:14   DG Chest Portable 1 View  Result Date: 03/11/2020 CLINICAL DATA:  Fall. EXAM: PORTABLE CHEST 1 VIEW COMPARISON:  01/23/2020.  06/21/2018. FINDINGS: Given technique mediastinum appears stable. Heart size stable. Interval clearing of right upper lung infiltrate. Mild infiltrate left upper lung cannot be excluded. Chronic interstitial changes again noted. No pleural effusion or pneumothorax. Degenerative change thoracic spine. No acute bony abnormality identified. IMPRESSION: 1. Interval clearing of right upper lung infiltrate. Mild infiltrate left upper lung cannot be excluded. Chronic interstitial changes again noted. 2.  No acute bony abnormality identified.  No pneumothorax. Electronically Signed   By: Maisie Fus  Register   On: 03/11/2020 12:28   DG Foot Complete Left  Result Date: 03/11/2020  CLINICAL DATA:  Left foot pain after a fall this morning. Initial encounter. EXAM: LEFT FOOT - COMPLETE 3+ VIEW COMPARISON:  None. FINDINGS: There is no evidence of fracture or dislocation. There is no evidence of arthropathy or other focal bone abnormality. Soft tissues are unremarkable. IMPRESSION: Negative exam. Electronically Signed   By: Drusilla Kanner M.D.   On: 03/11/2020 12:24   DG Hip Unilat W or Wo Pelvis 2-3 Views Left  Result Date: 03/11/2020 CLINICAL DATA:  Left hip pain after a fall this morning. Initial encounter. EXAM: DG HIP (WITH OR WITHOUT PELVIS) 2-3V LEFT COMPARISON:  None. FINDINGS: There is no evidence of hip fracture or dislocation. Mild bilateral hip degenerative change is seen. Soft tissues are negative. IMPRESSION: No acute abnormality. Electronically Signed   By: Drusilla Kanner M.D.   On: 03/11/2020 12:26    (Echo,  Carotid, EGD, Colonoscopy, ERCP)    Subjective:   Discharge Exam: Vitals:   03/17/20 0700 03/17/20 0746  BP: 134/70 102/75  Pulse: 88 72  Resp: 19 18  Temp: 98.4 F (36.9 C) (!) 97.5 F (36.4 C)  SpO2: 97% 95%   Vitals:   03/16/20 2036 03/16/20 2317 03/17/20 0700 03/17/20 0746  BP: 133/75 129/72 134/70 102/75  Pulse: 85 86 88 72  Resp: 19 20 19 18   Temp: 98.1 F (36.7 C) 98.4 F (36.9 C) 98.4 F (36.9 C) (!) 97.5 F (36.4 C)  TempSrc: Oral Oral Oral Oral  SpO2: 99% 97% 97% 95%  Weight:      Height:        General: Pt is alert, awake, not in acute distress Cardiovascular: S1/S2 +, no rubs, no gallops Respiratory: CTA bilaterally, no wheezing, no rhonchi Abdominal: Soft, NT, ND, bowel sounds + Extremities:  no cyanosis    The results of significant diagnostics from this hospitalization (including imaging, microbiology, ancillary and laboratory) are listed below for reference.     Microbiology: Recent Results (from the past 240 hour(s))  Urine culture     Status: Abnormal   Collection Time: 03/11/20 12:28 PM   Specimen: Urine, Random  Result Value Ref Range Status   Specimen Description   Final    URINE, RANDOM Performed at [facility], [address] [place], [place]    Special Requests   Final    NONE Performed at [facility], [address] [place], [place]    Culture 70,000 COLONIES/mL SERRATIA MARCESCENS (A)  Final   Report Status 03/14/2020 FINAL  Final   Organism ID, Bacteria SERRATIA MARCESCENS (A)  Final      Susceptibility   Serratia marcescens - MIC*    CEFAZOLIN >=64 RESISTANT Resistant     CEFEPIME <=0.12 SENSITIVE Sensitive     CEFTRIAXONE <=0.25 SENSITIVE Sensitive     CIPROFLOXACIN <=0.25 SENSITIVE Sensitive     GENTAMICIN <=1 SENSITIVE Sensitive     NITROFURANTOIN 128 RESISTANT Resistant     TRIMETH/SULFA <=20 SENSITIVE Sensitive     * 70,000 COLONIES/mL SERRATIA MARCESCENS  Resp Panel by  RT-PCR (Flu A&B, Covid) Nasopharyngeal Swab     Status: None   Collection Time: 03/11/20  1:35 PM   Specimen: Nasopharyngeal Swab; Nasopharyngeal(NP) swabs in vial transport medium  Result Value Ref Range Status   SARS Coronavirus 2 by RT PCR NEGATIVE NEGATIVE Final    Comment: (NOTE) SARS-CoV-2 target nucleic acids are NOT DETECTED.  The SARS-CoV-2 RNA is generally detectable in upper respiratory specimens during the acute phase  of infection. The lowest concentration of SARS-CoV-2 viral copies this assay can detect is 138 copies/mL. A negative result does not preclude SARS-Cov-2 infection and should not be used as the sole basis for treatment or other patient management decisions. A negative result may occur with  improper specimen collection/handling, submission of specimen other than nasopharyngeal swab, presence of viral mutation(s) within the areas targeted by this assay, and inadequate number of viral copies(<138 copies/mL). A negative result must be combined with clinical observations, patient history, and epidemiological information. The expected result is Negative.  Fact Sheet for Patients:  BloggerCourse.com  Fact Sheet for Healthcare Providers:  SeriousBroker.it  This test is no t yet approved or cleared by the [place] FDA and  has been authorized for detection and/or diagnosis of SARS-CoV-2 by FDA under an Emergency Use Authorization (EUA). This EUA will remain  in effect (meaning this test can be used) for the duration of the COVID-19 declaration under Section 564(b)(1) of the Act, 21 U.S.C.section 360bbb-3(b)(1), unless the authorization is terminated  or revoked sooner.       Influenza A by PCR NEGATIVE NEGATIVE Final   Influenza B by PCR NEGATIVE NEGATIVE Final    Comment: (NOTE) The Xpert Xpress SARS-CoV-2/FLU/RSV plus assay is intended as an aid in the diagnosis of influenza from Nasopharyngeal swab  specimens and should not be used as a sole basis for treatment. Nasal washings and aspirates are unacceptable for Xpert Xpress SARS-CoV-2/FLU/RSV testing.  Fact Sheet for Patients: BloggerCourse.com  Fact Sheet for Healthcare Providers: SeriousBroker.it  This test is not yet approved or cleared by the [place] FDA and has been authorized for detection and/or diagnosis of SARS-CoV-2 by FDA under an Emergency Use Authorization (EUA). This EUA will remain in effect (meaning this test can be used) for the duration of the COVID-19 declaration under Section 564(b)(1) of the Act, 21 U.S.C. section 360bbb-3(b)(1), unless the authorization is terminated or revoked.  Performed at [facility], [address] [place], [place] [address]   CULTURE, BLOOD (ROUTINE X 2) w Reflex to ID Panel     Status: None   Collection Time: 03/11/20  2:12 PM   Specimen: BLOOD  Result Value Ref Range Status   Specimen Description BLOOD BLOOD LEFT FOREARM  Final   Special Requests   Final    BOTTLES DRAWN AEROBIC AND ANAEROBIC Blood Culture results may not be optimal due to an inadequate volume of blood received in culture bottles   Culture   Final    NO GROWTH 5 DAYS Performed at [facility], [address] [place], [place] [address]    Report Status 03/16/2020 FINAL  Final  CULTURE, BLOOD (ROUTINE X 2) w Reflex to ID Panel     Status: None   Collection Time: 03/11/20  2:12 PM   Specimen: BLOOD  Result Value Ref Range Status   Specimen Description BLOOD BLOOD RIGHT FOREARM  Final   Special Requests   Final    BOTTLES DRAWN AEROBIC AND ANAEROBIC Blood Culture results may not be optimal due to an inadequate volume of blood received in culture bottles   Culture   Final    NO GROWTH 5 DAYS Performed at [facility], [address] [place], [place] [address]    Report Status 03/16/2020 FINAL  Final  Resp Panel by RT-PCR  (Flu A&B, Covid) Nasopharyngeal Swab     Status: None   Collection Time: 03/14/20  3:02 PM   Specimen: Nasopharyngeal Swab;  Nasopharyngeal(NP) swabs in vial transport medium  Result Value Ref Range Status   SARS Coronavirus 2 by RT PCR NEGATIVE NEGATIVE Final    Comment: (NOTE) SARS-CoV-2 target nucleic acids are NOT DETECTED.  The SARS-CoV-2 RNA is generally detectable in upper respiratory specimens during the acute phase of infection. The lowest concentration of SARS-CoV-2 viral copies this assay can detect is 138 copies/mL. A negative result does not preclude SARS-Cov-2 infection and should not be used as the sole basis for treatment or other patient management decisions. A negative result may occur with  improper specimen collection/handling, submission of specimen other than nasopharyngeal swab, presence of viral mutation(s) within the areas targeted by this assay, and inadequate number of viral copies(<138 copies/mL). A negative result must be combined with clinical observations, patient history, and epidemiological information. The expected result is Negative.  Fact Sheet for Patients:  BloggerCourse.com  Fact Sheet for Healthcare Providers:  SeriousBroker.it  This test is no t yet approved or cleared by the [place] FDA and  has been authorized for detection and/or diagnosis of SARS-CoV-2 by FDA under an Emergency Use Authorization (EUA). This EUA will remain  in effect (meaning this test can be used) for the duration of the COVID-19 declaration under Section 564(b)(1) of the Act, 21 U.S.C.section 360bbb-3(b)(1), unless the authorization is terminated  or revoked sooner.       Influenza A by PCR NEGATIVE NEGATIVE Final   Influenza B by PCR NEGATIVE NEGATIVE Final    Comment: (NOTE) The Xpert Xpress SARS-CoV-2/FLU/RSV plus assay is intended as an aid in the diagnosis of influenza from Nasopharyngeal swab specimens  and should not be used as a sole basis for treatment. Nasal washings and aspirates are unacceptable for Xpert Xpress SARS-CoV-2/FLU/RSV testing.  Fact Sheet for Patients: BloggerCourse.com  Fact Sheet for Healthcare Providers: SeriousBroker.it  This test is not yet approved or cleared by the [place] FDA and has been authorized for detection and/or diagnosis of SARS-CoV-2 by FDA under an Emergency Use Authorization (EUA). This EUA will remain in effect (meaning this test can be used) for the duration of the COVID-19 declaration under Section 564(b)(1) of the Act, 21 U.S.C. section 360bbb-3(b)(1), unless the authorization is terminated or revoked.  Performed at [facility], [address] [place], [place] [address]      Labs: BNP (last 3 results) No results for input(s): BNP in the last 8760 hours. Basic Metabolic Panel: Recent Labs  Lab 03/13/20 0524 03/14/20 0627 03/15/20 0536 03/16/20 0435 03/17/20 0421  NA 140 140 137 136 136  K 3.2* 4.0 4.0 4.0 4.1  CL 108 110 106 103 100  CO2 25 22 24 26 29   GLUCOSE 85 93 98 99 118*  BUN 16 14 14 16 17   CREATININE 1.03 0.97 1.04 1.03 1.01  CALCIUM 8.1* 8.1* 8.1* 8.4* 8.5*   Liver Function Tests: Recent Labs  Lab 03/11/20 1220  AST 18  ALT 8  ALKPHOS 59  BILITOT 1.2  PROT 6.4*  ALBUMIN 3.1*   No results for input(s): LIPASE, AMYLASE in the last 168 hours. No results for input(s): AMMONIA in the last 168 hours. CBC: Recent Labs  Lab 03/11/20 1220 03/12/20 0512 03/13/20 0524 03/14/20 0627 03/15/20 0536 03/16/20 0435 03/17/20 0421  WBC 32.1*   < > 20.5* 11.4* 10.2 12.5* 17.0*  NEUTROABS 25.7*  --   --   --   --   --   --   HGB 13.1   < > 11.2*  11.4* 11.6* 12.0* 11.4*  HCT 38.4*   < > 32.6* 33.6* 34.2* 34.5* 33.5*  MCV 98.5   < > 98.5 98.2 97.2 97.2 97.4  PLT 125*   < > 96* 116* 120* 151 179   < > = values in this interval not displayed.    Cardiac Enzymes: No results for input(s): CKTOTAL, CKMB, CKMBINDEX, TROPONINI in the last 168 hours. BNP: Invalid input(s): POCBNP CBG: No results for input(s): GLUCAP in the last 168 hours. D-Dimer No results for input(s): DDIMER in the last 72 hours. Hgb A1c No results for input(s): HGBA1C in the last 72 hours. Lipid Profile No results for input(s): CHOL, HDL, LDLCALC, TRIG, CHOLHDL, LDLDIRECT in the last 72 hours. Thyroid function studies No results for input(s): TSH, T4TOTAL, T3FREE, THYROIDAB in the last 72 hours.  Invalid input(s): FREET3 Anemia work up No results for input(s): VITAMINB12, FOLATE, FERRITIN, TIBC, IRON, RETICCTPCT in the last 72 hours. Urinalysis    Component Value Date/Time   COLORURINE AMBER (A) 03/11/2020 1220   APPEARANCEUR CLOUDY (A) 03/11/2020 1220   APPEARANCEUR Clear 02/17/2012 1315   LABSPEC 1.020 03/11/2020 1220   LABSPEC 1.018 02/17/2012 1315   PHURINE 5.0 03/11/2020 1220   GLUCOSEU NEGATIVE 03/11/2020 1220   GLUCOSEU Negative 02/17/2012 1315   HGBUR MODERATE (A) 03/11/2020 1220   BILIRUBINUR NEGATIVE 03/11/2020 1220   BILIRUBINUR Negative 02/17/2012 1315   KETONESUR 5 (A) 03/11/2020 1220   PROTEINUR 100 (A) 03/11/2020 1220   UROBILINOGEN 0.2 03/15/2007 2130   NITRITE NEGATIVE 03/11/2020 1220   LEUKOCYTESUR MODERATE (A) 03/11/2020 1220   LEUKOCYTESUR Negative 02/17/2012 1315   Sepsis Labs Invalid input(s): PROCALCITONIN,  WBC,  LACTICIDVEN Microbiology Recent Results (from the past 240 hour(s))  Urine culture     Status: Abnormal   Collection Time: 03/11/20 12:28 PM   Specimen: Urine, Random  Result Value Ref Range Status   Specimen Description   Final    URINE, RANDOM Performed at [facility], [address] [place], [place] [address]    Special Requests   Final    NONE Performed at [facility], [address] [place], [place] [address]    Culture 70,000 COLONIES/mL SERRATIA MARCESCENS (A)  Final    Report Status 03/14/2020 FINAL  Final   Organism ID, Bacteria SERRATIA MARCESCENS (A)  Final      Susceptibility   Serratia marcescens - MIC*    CEFAZOLIN >=64 RESISTANT Resistant     CEFEPIME <=0.12 SENSITIVE Sensitive     CEFTRIAXONE <=0.25 SENSITIVE Sensitive     CIPROFLOXACIN <=0.25 SENSITIVE Sensitive     GENTAMICIN <=1 SENSITIVE Sensitive     NITROFURANTOIN 128 RESISTANT Resistant     TRIMETH/SULFA <=20 SENSITIVE Sensitive     * 70,000 COLONIES/mL SERRATIA MARCESCENS  Resp Panel by RT-PCR (Flu A&B, Covid) Nasopharyngeal Swab     Status: None   Collection Time: 03/11/20  1:35 PM   Specimen: Nasopharyngeal Swab; Nasopharyngeal(NP) swabs in vial transport medium  Result Value Ref Range Status   SARS Coronavirus 2 by RT PCR NEGATIVE NEGATIVE Final    Comment: (NOTE) SARS-CoV-2 target nucleic acids are NOT DETECTED.  The SARS-CoV-2 RNA is generally detectable in upper respiratory specimens during the acute phase of infection. The lowest concentration of SARS-CoV-2 viral copies this assay can detect is 138 copies/mL. A negative result does not preclude SARS-Cov-2 infection and should not be used as the sole basis for treatment or other patient management decisions. A negative result may  occur with  improper specimen collection/handling, submission of specimen other than nasopharyngeal swab, presence of viral mutation(s) within the areas targeted by this assay, and inadequate number of viral copies(<138 copies/mL). A negative result must be combined with clinical observations, patient history, and epidemiological information. The expected result is Negative.  Fact Sheet for Patients:  BloggerCourse.com  Fact Sheet for Healthcare Providers:  SeriousBroker.it  This test is no t yet approved or cleared by the [place] FDA and  has been authorized for detection and/or diagnosis of SARS-CoV-2 by FDA under an Emergency Use  Authorization (EUA). This EUA will remain  in effect (meaning this test can be used) for the duration of the COVID-19 declaration under Section 564(b)(1) of the Act, 21 U.S.C.section 360bbb-3(b)(1), unless the authorization is terminated  or revoked sooner.       Influenza A by PCR NEGATIVE NEGATIVE Final   Influenza B by PCR NEGATIVE NEGATIVE Final    Comment: (NOTE) The Xpert Xpress SARS-CoV-2/FLU/RSV plus assay is intended as an aid in the diagnosis of influenza from Nasopharyngeal swab specimens and should not be used as a sole basis for treatment. Nasal washings and aspirates are unacceptable for Xpert Xpress SARS-CoV-2/FLU/RSV testing.  Fact Sheet for Patients: BloggerCourse.com  Fact Sheet for Healthcare Providers: SeriousBroker.it  This test is not yet approved or cleared by the [place] FDA and has been authorized for detection and/or diagnosis of SARS-CoV-2 by FDA under an Emergency Use Authorization (EUA). This EUA will remain in effect (meaning this test can be used) for the duration of the COVID-19 declaration under Section 564(b)(1) of the Act, 21 U.S.C. section 360bbb-3(b)(1), unless the authorization is terminated or revoked.  Performed at [facility], [address] [place], [place] [address]   CULTURE, BLOOD (ROUTINE X 2) w Reflex to ID Panel     Status: None   Collection Time: 03/11/20  2:12 PM   Specimen: BLOOD  Result Value Ref Range Status   Specimen Description BLOOD BLOOD LEFT FOREARM  Final   Special Requests   Final    BOTTLES DRAWN AEROBIC AND ANAEROBIC Blood Culture results may not be optimal due to an inadequate volume of blood received in culture bottles   Culture   Final    NO GROWTH 5 DAYS Performed at [facility], [address] [place], [place] [address]    Report Status 03/16/2020 FINAL  Final  CULTURE, BLOOD (ROUTINE X 2) w Reflex to ID Panel     Status: None    Collection Time: 03/11/20  2:12 PM   Specimen: BLOOD  Result Value Ref Range Status   Specimen Description BLOOD BLOOD RIGHT FOREARM  Final   Special Requests   Final    BOTTLES DRAWN AEROBIC AND ANAEROBIC Blood Culture results may not be optimal due to an inadequate volume of blood received in culture bottles   Culture   Final    NO GROWTH 5 DAYS Performed at [facility], [address] [place], [place] [address]    Report Status 03/16/2020 FINAL  Final  Resp Panel by RT-PCR (Flu A&B, Covid) Nasopharyngeal Swab     Status: None   Collection Time: 03/14/20  3:02 PM   Specimen: Nasopharyngeal Swab; Nasopharyngeal(NP) swabs in vial transport medium  Result Value Ref Range Status   SARS Coronavirus 2 by RT PCR NEGATIVE NEGATIVE Final    Comment: (NOTE) SARS-CoV-2 target nucleic acids are NOT DETECTED.  The SARS-CoV-2 RNA is generally detectable in upper  respiratory specimens during the acute phase of infection. The lowest concentration of SARS-CoV-2 viral copies this assay can detect is 138 copies/mL. A negative result does not preclude SARS-Cov-2 infection and should not be used as the sole basis for treatment or other patient management decisions. A negative result may occur with  improper specimen collection/handling, submission of specimen other than nasopharyngeal swab, presence of viral mutation(s) within the areas targeted by this assay, and inadequate number of viral copies(<138 copies/mL). A negative result must be combined with clinical observations, patient history, and epidemiological information. The expected result is Negative.  Fact Sheet for Patients:  BloggerCourse.com  Fact Sheet for Healthcare Providers:  SeriousBroker.it  This test is no t yet approved or cleared by the [place] FDA and  has been authorized for detection and/or diagnosis of SARS-CoV-2 by FDA under an Emergency Use  Authorization (EUA). This EUA will remain  in effect (meaning this test can be used) for the duration of the COVID-19 declaration under Section 564(b)(1) of the Act, 21 U.S.C.section 360bbb-3(b)(1), unless the authorization is terminated  or revoked sooner.       Influenza A by PCR NEGATIVE NEGATIVE Final   Influenza B by PCR NEGATIVE NEGATIVE Final    Comment: (NOTE) The Xpert Xpress SARS-CoV-2/FLU/RSV plus assay is intended as an aid in the diagnosis of influenza from Nasopharyngeal swab specimens and should not be used as a sole basis for treatment. Nasal washings and aspirates are unacceptable for Xpert Xpress SARS-CoV-2/FLU/RSV testing.  Fact Sheet for Patients: BloggerCourse.com  Fact Sheet for Healthcare Providers: SeriousBroker.it  This test is not yet approved or cleared by the [place] FDA and has been authorized for detection and/or diagnosis of SARS-CoV-2 by FDA under an Emergency Use Authorization (EUA). This EUA will remain in effect (meaning this test can be used) for the duration of the COVID-19 declaration under Section 564(b)(1) of the Act, 21 U.S.C. section 360bbb-3(b)(1), unless the authorization is terminated or revoked.  Performed at [facility], [address], [place], [place] [address]      Time coordinating discharge: Over 30 minutes  SIGNED:   Charise Killian, MD  [facility] 03/17/2020, 10:35 AM Pager   If 7PM-7AM, please contact night-coverage

## 2020-03-17 NOTE — Progress Notes (Signed)
Mobility Specialist - Progress Note   03/17/20 1100  Mobility  Range of Motion/Exercises Right leg;Left leg (ankle pumps, straight leg raises, hip abd/add)  Level of Assistance Moderate assist, patient does 50-74%  Assistive Device None  Distance Ambulated (ft) 0 ft  Mobility Response Tolerated well  Mobility performed by Mobility specialist  $Mobility charge 1 Mobility    Pre-mobility: 70 HR, 92% SpO2 Post-mobility: 70 HR, 93% SpO2   Pt was sleeping in bed upon arrival. Pt was awakened by voice. Pt was alert, responsive to questions, and able to order lunch prior to activity. EOB activity deferred d/t pt unable to keep eyes open for session. Pt performed supine exercises: ankle pumps, hip abd/adduction, and straight leg raises with modA. Further mobility deferred d/t pt falling back asleep. Overall, pt tolerated session well. Pt was left in bed with all needs in reach and alarm set.    Filiberto Pinks Mobility Specialist 03/17/20, 11:18 AM

## 2020-03-17 NOTE — Care Management Important Message (Signed)
Important Message  Patient Details  Name: Austin Marsh MRN: [id number] Date of Birth: [birth year]   Medicare Important Message Given:  Yes     Johnell Comings 03/17/2020, 12:15 PM

## 2020-03-17 NOTE — TOC Transition Note (Signed)
Transition of Care [facility]) - CM/SW Discharge Note   Patient Details  Name: Austin Marsh MRN: [id number] Date of Birth: 06-24-1955  [facility] CM/SW Contact:  Chapman Fitch, RN Phone Number: 03/17/2020, 1:51 PM   Clinical Narrative:     Patient to discharge to [facility] today pending negative covid test DC info sent in HUB  covid test sent EMS transport arranged for 3 pm  Bedside RN to call report  Per patient request Doristine Mango at family care home notified   Final next level of care: Skilled Nursing Facility     Patient Goals and CMS Choice     Choice offered to / list presented to : Patient  Discharge Placement              Patient chooses bed at: [facility] Patient to be transferred to facility by: EMS      Discharge Plan and Services                                     Social Determinants of Health (SDOH) Interventions     Readmission Risk Interventions Readmission Risk Prevention Plan 03/17/2020  Transportation Screening Complete  PCP or Specialist Appt within 3-5 Days (No Data)  HRI or Home Care Consult (No Data)  Palliative Care Screening Not Applicable  Medication Review (RN Care Manager) Complete  Some recent data might be hidden

## 2020-03-17 NOTE — Progress Notes (Signed)
RN called and gave report to Kimberly,LPN at [facility].   Austin Marsh

## 2020-05-08 ENCOUNTER — Encounter: Payer: Self-pay | Admitting: Emergency Medicine

## 2020-05-08 ENCOUNTER — Other Ambulatory Visit: Payer: Self-pay

## 2020-05-08 ENCOUNTER — Emergency Department: Payer: Medicare Other

## 2020-05-08 DIAGNOSIS — E039 Hypothyroidism, unspecified: Secondary | ICD-10-CM | POA: Diagnosis not present

## 2020-05-08 DIAGNOSIS — R4182 Altered mental status, unspecified: Secondary | ICD-10-CM | POA: Insufficient documentation

## 2020-05-08 DIAGNOSIS — N179 Acute kidney failure, unspecified: Secondary | ICD-10-CM | POA: Diagnosis not present

## 2020-05-08 DIAGNOSIS — J449 Chronic obstructive pulmonary disease, unspecified: Secondary | ICD-10-CM | POA: Insufficient documentation

## 2020-05-08 DIAGNOSIS — R441 Visual hallucinations: Secondary | ICD-10-CM | POA: Insufficient documentation

## 2020-05-08 DIAGNOSIS — N390 Urinary tract infection, site not specified: Secondary | ICD-10-CM | POA: Diagnosis not present

## 2020-05-08 DIAGNOSIS — F17210 Nicotine dependence, cigarettes, uncomplicated: Secondary | ICD-10-CM | POA: Insufficient documentation

## 2020-05-08 DIAGNOSIS — Z79899 Other long term (current) drug therapy: Secondary | ICD-10-CM | POA: Insufficient documentation

## 2020-05-08 DIAGNOSIS — R531 Weakness: Secondary | ICD-10-CM | POA: Diagnosis present

## 2020-05-08 DIAGNOSIS — M542 Cervicalgia: Secondary | ICD-10-CM | POA: Diagnosis not present

## 2020-05-08 LAB — COMPREHENSIVE METABOLIC PANEL
ALT: 8 U/L (ref 0–44)
AST: 14 U/L — ABNORMAL LOW (ref 15–41)
Albumin: 3.6 g/dL (ref 3.5–5.0)
Alkaline Phosphatase: 60 U/L (ref 38–126)
Anion gap: 10 (ref 5–15)
BUN: 23 mg/dL (ref 8–23)
CO2: 27 mmol/L (ref 22–32)
Calcium: 9 mg/dL (ref 8.9–10.3)
Chloride: 102 mmol/L (ref 98–111)
Creatinine, Ser: 1.69 mg/dL — ABNORMAL HIGH (ref 0.61–1.24)
GFR, Estimated: 44 mL/min — ABNORMAL LOW (ref >60)
Glucose, Bld: 108 mg/dL — ABNORMAL HIGH (ref 70–99)
Potassium: 4.6 mmol/L (ref 3.5–5.1)
Sodium: 139 mmol/L (ref 135–145)
Total Bilirubin: 0.7 mg/dL (ref 0.3–1.2)
Total Protein: 7.4 g/dL (ref 6.5–8.1)

## 2020-05-08 LAB — CBC WITH DIFFERENTIAL/PLATELET
Abs Immature Granulocytes: 0.44 10*3/uL — ABNORMAL HIGH (ref 0.00–0.07)
Basophils Absolute: 0.1 10*3/uL (ref 0.0–0.1)
Basophils Relative: 1 %
Eosinophils Absolute: 0 10*3/uL (ref 0.0–0.5)
Eosinophils Relative: 0 %
HCT: 44.7 % (ref 39.0–52.0)
Hemoglobin: 14.7 g/dL (ref 13.0–17.0)
Immature Granulocytes: 4 %
Lymphocytes Relative: 29 %
Lymphs Abs: 3.2 10*3/uL (ref 0.7–4.0)
MCH: 32.7 pg (ref 26.0–34.0)
MCHC: 32.9 g/dL (ref 30.0–36.0)
MCV: 99.3 fL (ref 80.0–100.0)
Monocytes Absolute: 1 10*3/uL (ref 0.1–1.0)
Monocytes Relative: 9 %
Neutro Abs: 6.3 10*3/uL (ref 1.7–7.7)
Neutrophils Relative %: 57 %
Platelets: 277 10*3/uL (ref 150–400)
RBC: 4.5 MIL/uL (ref 4.22–5.81)
RDW: 14.8 % (ref 11.5–15.5)
WBC: 11 10*3/uL — ABNORMAL HIGH (ref 4.0–10.5)
nRBC: 0 % (ref 0.0–0.2)

## 2020-05-08 NOTE — ED Triage Notes (Addendum)
Pt to ED via EMS from [facility]. Pt presents to the ED with ams. EMS states that pt has had recurrent UTIs and foul smelling urine, so facility sent pt to ED for evaluation. Pt states he is having visual hallucinations and also stated that "everybody is out to get me." Pt denies any suicidal thoughts at this time.

## 2020-05-08 NOTE — ED Triage Notes (Signed)
First Nurse Note:  Arrives from [facility] for ED evaluation of frequent falls.  Per EMS report, urine foul smelling.  Vs wnl and weakness bilaterally to legs.

## 2020-05-08 NOTE — ED Notes (Signed)
Pt unable to void at this time. 

## 2020-05-08 NOTE — ED Notes (Signed)
Pt arrived to ED with brown jacket, black shirt, blue pants, white pair of socks, black pair of shoes, and a watch.

## 2020-05-09 ENCOUNTER — Other Ambulatory Visit: Payer: Self-pay

## 2020-05-09 ENCOUNTER — Emergency Department
Admission: EM | Admit: 2020-05-09 | Discharge: 2020-05-09 | Disposition: A | Discharge status: Home / Self Care | Payer: Medicare Other | Attending: Emergency Medicine | Admitting: Emergency Medicine

## 2020-05-09 ENCOUNTER — Emergency Department: Payer: Medicare Other

## 2020-05-09 DIAGNOSIS — R531 Weakness: Secondary | ICD-10-CM

## 2020-05-09 DIAGNOSIS — N390 Urinary tract infection, site not specified: Secondary | ICD-10-CM

## 2020-05-09 DIAGNOSIS — N179 Acute kidney failure, unspecified: Secondary | ICD-10-CM

## 2020-05-09 LAB — URINALYSIS, COMPLETE (UACMP) WITH MICROSCOPIC
Bilirubin Urine: NEGATIVE
Glucose, UA: NEGATIVE mg/dL
Ketones, ur: NEGATIVE mg/dL
Nitrite: NEGATIVE
Protein, ur: 30 mg/dL — AB
Specific Gravity, Urine: 1.016 (ref 1.005–1.030)
Squamous Epithelial / HPF: NONE SEEN (ref 0–5)
WBC, UA: >50 WBC/hpf — ABNORMAL HIGH (ref 0–5)
pH: 5 (ref 5.0–8.0)

## 2020-05-09 LAB — TSH: TSH: 6.716 u[IU]/mL — ABNORMAL HIGH (ref 0.350–4.500)

## 2020-05-09 LAB — T4, FREE: Free T4: 1.02 ng/dL (ref 0.61–1.12)

## 2020-05-09 MED ORDER — SODIUM CHLORIDE 0.9 % IV SOLN
1.0000 g | Freq: Once | INTRAVENOUS | Status: AC
  Administered 2020-05-09: 1 g via INTRAVENOUS
  Filled 2020-05-09: qty 10

## 2020-05-09 MED ORDER — SODIUM CHLORIDE 0.9 % IV BOLUS (SEPSIS)
1000.0000 mL | Freq: Once | INTRAVENOUS | Status: AC
  Administered 2020-05-09: 1000 mL via INTRAVENOUS

## 2020-05-09 MED ORDER — CEPHALEXIN 500 MG PO CAPS: 500.0000 mg | ORAL_CAPSULE | Freq: Two times a day (BID) | ORAL | 0 refills | Status: DC

## 2020-05-09 NOTE — ED Provider Notes (Signed)
[facility] Emergency Department Provider Note  ____________________________________________   Event Date/Time   First MD Initiated Contact with Patient 05/09/20 [phone]     (approximate)  I have reviewed the triage vital signs and the nursing notes.   HISTORY  Chief Complaint Hallucinations and Altered Mental Status    HPI Austin Marsh is a 65 y.o. male with history of COPD, hypothyroidism, schizoaffective disorder, tardive dyskinesia who presents to the emergency department with generalized weakness.  States that he has had several falls over the past several days but does not think he has hit his head.  He denies being on blood thinners.  He complains of some left-sided neck pain.  Denies any preceding symptoms that led to his fall.  No chest pain, shortness of breath, dizziness.  Denies numbness or focal weakness. Denies fever, cough, vomiting, diarrhea.  Patient lives at  [facility].  Spoke with staff there who state that patient couldn't walk today with his walker.  Was shaky.  Had a hard time eating because his hands were shaking.  BP was 137/87 and HR was 80 and temp was 97.7.  She denies that he has had any falls other than going down to his knees 1 time when she tried to get him up to walk with his walker.  States that she called his primary care physician who recommended he come to the emergency department.   Reports visual hallucinations.  When asked to describe this further patient states that he thought he saw a [employer] cup filled with Coca[employer]Cola today that he accidentally spilled.  No auditory hallucinations.  No SI.  No HI.  Past Medical History:  Diagnosis Date  . COPD (chronic obstructive pulmonary disease) (HCC)   . Schizoaffective disorder, bipolar type (HCC)   . Sleep apnea   . Tardive dyskinesia   . Thyroid disease     Patient Active Problem List   Diagnosis Date Noted  . Generalized weakness   . UTI (urinary tract  infection) 03/11/2020  . AKI (acute kidney injury) (HCC) 03/11/2020  . Sepsis (HCC) 03/11/2020  . COPD (chronic obstructive pulmonary disease) (HCC)   . Hypothyroidism   . Depression with anxiety   . Schizoaffective disorder, bipolar type (HCC)   . Tobacco abuse   . Fall   . Scrotal pain 06/26/2018    Past Surgical History:  Procedure Laterality Date  . APPENDECTOMY      Prior to Admission medications   Medication Sig Start Date End Date Taking? Authorizing Provider  acetaminophen (TYLENOL) 325 MG tablet Take 650 mg by mouth every 4 (four) hours as needed.    [provider]  albuterol (VENTOLIN HFA) 108 (90 Base) MCG/ACT inhaler Inhale 2 puffs into the lungs every 4 (four) hours as needed for wheezing or shortness of breath. 01/23/20   Shaune Pollack, MD  alum & mag hydroxide-simeth (MAALOX/MYLANTA) 200-200-20 MG/5ML suspension Take 30 mLs by mouth daily as needed for indigestion or heartburn.    [provider]  benztropine (COGENTIN) 2 MG tablet Take 2 mg by mouth 2 (two) times daily.    [provider]  bismuth subsalicylate (PEPTO BISMOL) 262 MG/15ML suspension Take 15 mLs by mouth every 4 (four) hours as needed.    [provider]  busPIRone (BUSPAR) 15 MG tablet Take 15 mg by mouth 2 (two) times daily.    [provider]  cloZAPine (CLOZARIL) 100 MG tablet Take 100-200 mg by mouth daily. Take 100  mg in the morning and 200 mg at bedtime for scizophrenia    [provider]  divalproex (DEPAKOTE ER) 500 MG 24 hr tablet Take 1,000 mg by mouth at bedtime.    [provider]  gabapentin (NEURONTIN) 100 MG capsule Take 200 mg by mouth at bedtime.    [provider]  guaiFENesin (ROBITUSSIN) 100 MG/5ML liquid Take 200 mg by mouth every 6 (six) hours as needed for cough.    [provider]  levothyroxine (SYNTHROID, LEVOTHROID) 75 MCG tablet Take 75 mcg by mouth daily before breakfast.    [provider]  loperamide (IMODIUM) 2 MG capsule Take 2 mg by mouth as needed for diarrhea or loose stools.    [provider]  magnesium hydroxide (MILK OF MAGNESIA) 400 MG/5ML suspension Take 30 mLs by mouth daily as needed for mild constipation.    [provider]  neomycin-bacitracin-polymyxin (NEOSPORIN) ointment Apply 1 application topically as needed for wound care.    [provider]  omeprazole (PRILOSEC) 40 MG capsule Take 40 mg by mouth daily.  Patient not taking: Reported on 03/11/2020    [provider]  pantoprazole (PROTONIX) 40 MG tablet Take 40 mg by mouth daily.    [provider]  tamsulosin (FLOMAX) 0.4 MG CAPS capsule Take 0.4 mg by mouth 2 (two) times daily.    [provider]  vitamin B-12 (CYANOCOBALAMIN) 100 MCG tablet Take 100 mcg by mouth daily.    [provider]  Vitamin D, Cholecalciferol, 25 MCG (1000 UT) TABS Take 50 mcg by mouth daily.     [provider]    Allergies Haldol [haloperidol lactate], Other, Thorazine [chlorpromazine], and Trazodone and nefazodone  History reviewed. No pertinent family history.  Social History Social History   Tobacco Use  . Smoking status: Current Every Day Smoker    Types: Cigarettes  . Smokeless tobacco: Never Used  Substance Use Topics  . Alcohol use: Not Currently  . Drug use: Not Currently    Review of Systems Constitutional: No fever. Eyes: No visual changes. ENT: No sore throat. Cardiovascular: Denies chest pain. Respiratory: Denies shortness of breath. Gastrointestinal: No nausea, vomiting, diarrhea. Genitourinary: Negative for dysuria. Musculoskeletal: Negative for back pain. Skin: Negative for rash. Neurological: Negative for focal weakness or numbness.  ____________________________________________   PHYSICAL EXAM:  VITAL SIGNS: ED Triage Vitals  Enc Vitals Group     BP 05/08/20 1658 98/72     Pulse Rate 05/08/20 1658 70     Resp  05/08/20 1658 18     Temp 05/08/20 1658 98 F (36.7 C)     Temp Source 05/08/20 1658 Oral     SpO2 05/08/20 1658 99 %     Weight 05/08/20 1648 120 lb (54.4 kg)     Height 05/08/20 1648 5\' 9"  (1.753 m)     Head Circumference --      Peak Flow --      Pain Score 05/08/20 1648 0     Pain Loc --      Pain Edu? --      Excl. in GC? --    CONSTITUTIONAL: Alert and oriented x4 and responds appropriately to questions.  Chronically ill-appearing HEAD: Normocephalic, atraumatic EYES: Conjunctivae clear, pupils appear equal, EOM appear intact ENT: normal nose; moist mucous membranes NECK: Supple, normal ROM, no midline spinal tenderness or step-off or deformity, trachea midline, patient reports pain in the left lateral neck without ecchymosis, soft tissue swelling, redness  or warmth CARD: RRR; S1 and S2 appreciated; no murmurs, no clicks, no rubs, no gallops RESP: Normal chest excursion without splinting or tachypnea; breath sounds clear and equal bilaterally; no wheezes, no rhonchi, no rales, no hypoxia or respiratory distress, speaking full sentences ABD/GI: Normal bowel sounds; non-distended; soft, non-tender, no rebound, no guarding, no peritoneal signs, no hepatosplenomegaly BACK: The back appears normal, no midline spinal tenderness or step-off or deformity EXT: Normal ROM in all joints; no deformity noted, no edema; no cyanosis SKIN: Normal color for age and race; warm; no rash on exposed skin NEURO: Moves all extremities equally, normal sensation diffusely, no drift, cranial nerves II through XII intact, normal speech, intermittently has involuntary facial movements, no asterixis, no tremulousness PSYCH: The patient's mood and manner are appropriate.  Does not appear to be responding to internal stimuli.  No SI or HI.  ____________________________________________   LABS (all labs ordered are listed, but only abnormal results are displayed)  Labs Reviewed  COMPREHENSIVE METABOLIC PANEL  - Abnormal; Notable for the following components:      Result Value   Glucose, Bld 108 (*)    Creatinine, Ser 1.69 (*)    AST 14 (*)    GFR, Estimated 44 (*)    All other components within normal limits  CBC WITH DIFFERENTIAL/PLATELET - Abnormal; Notable for the following components:   WBC 11.0 (*)    Abs Immature Granulocytes 0.44 (*)    All other components within normal limits  TSH - Abnormal; Notable for the following components:   TSH 6.716 (*)    All other components within normal limits  URINE CULTURE  URINALYSIS, COMPLETE (UACMP) WITH MICROSCOPIC  T3, FREE  T4, FREE   ____________________________________________  EKG   EKG Interpretation  Date/Time:  Friday May 09 2020 04:10:54 EST Ventricular Rate:  72 PR Interval:  156 QRS Duration: 66 QT Interval:  352 QTC Calculation: 385 R Axis:   59 Text Interpretation: Normal sinus rhythm Normal ECG Artifact No significant change since last tracing Confirmed by Rochele RaringWard, Jacqualine Weichel [phone]([id number]) on 05/09/2020 4:23:17 AM       ____________________________________________  RADIOLOGY Normajean BaxterI, Izzabell Klasen, personally viewed and evaluated these images (plain radiographs) as part of my medical decision making, as well as reviewing the written report by the radiologist.  ED MD interpretation: CT head and cervical spine show no acute injury.  Chest x-ray shows no infiltrate or edema.  Official radiology report(s): DG Chest 2 View  Result Date: 05/08/2020 CLINICAL DATA:  Altered mental status EXAM: CHEST - 2 VIEW COMPARISON:  03/11/2020 FINDINGS: The heart size and mediastinal contours are within normal limits. Both lungs are clear. The visualized skeletal structures are unremarkable. IMPRESSION: No active cardiopulmonary disease. Electronically Signed   By: Jasmine PangKim  Fujinaga M.D.   On: 05/08/2020 19:03   CT Head Wo Contrast  Result Date: 05/09/2020 CLINICAL DATA:  Frequent falls, hallucinations, bilateral lower extremity weakness EXAM: CT HEAD  WITHOUT CONTRAST CT CERVICAL SPINE WITHOUT CONTRAST TECHNIQUE: Multidetector CT imaging of the head and cervical spine was performed following the standard protocol without intravenous contrast. Multiplanar CT image reconstructions of the cervical spine were also generated. COMPARISON:  Is CT head and cervical spine 03/11/2020 FINDINGS: CT HEAD FINDINGS Brain: No evidence of acute infarction, hemorrhage, hydrocephalus, extra-axial collection, visible mass lesion or mass effect. Prominence of the ventricles, cisterns and sulci compatible with frontal predominant parenchymal volume loss. Patchy areas of white matter hypoattenuation are most compatible with chronic microvascular angiopathy. Vascular: Atherosclerotic  calcification of the carotid siphons and left dominant intradural vertebral arteries. No hyperdense vessel. Skull: No calvarial fracture or suspicious osseous lesion. No scalp swelling or hematoma. Sinuses/Orbits: Paranasal sinuses and mastoid air cells are predominantly clear. Rightward nasal septal deviation and nasal septal spur. Middle ear cavities are clear. Extensive debris in the right external auditory canal. Included orbital structures are unremarkable. Other: Edentulous with mandibular prognathism. CT CERVICAL SPINE FINDINGS Alignment: Stabilization collar absent at the time of examination. Preservation of the normal cervical lordosis. Stepwise retrolisthesis C3-C5 is favored to be on a degenerative basis. No evidence of traumatic listhesis. No abnormally widened, perched or jumped facets. Normal alignment of the craniocervical and atlantoaxial articulations. Skull base and vertebrae: No acute skull base fracture. No vertebral body fracture or height loss. Normal bone mineralization. No worrisome osseous lesions. Moderate arthrosis at the atlantodental and basion dens intervals. Cervical spondylitic changes as detailed below. Soft tissues and spinal canal: No pre or paravertebral fluid or  swelling. No visible canal hematoma. Airways patent. Cervical carotid atherosclerosis. Disc levels: Multilevel intervertebral disc height loss with spondylitic endplate changes. Multilevel disc osteophyte complexes are present. Combination with some mild ligamentum flavum infolding at the C5-6 level there may be mild canal impingement. Remaining levels appear to efface the ventral thecal sac without significant canal stenosis. Multilevel uncinate spurring and facet hypertrophic changes result in mild multilevel neural foraminal narrowing as well. No severe neural foraminal impingement. Upper chest: No acute abnormality in the upper chest or imaged lung apices. Emphysematous changes noted in the apices. Other: Diminutive thyroid without concerning nodules or masses. IMPRESSION: 1. No acute intracranial abnormality. 2. Frontal predominant parenchymal volume loss and chronic microvascular ischemic white matter disease. 3. Extensive debris in the right external auditory canal, correlate for cerumen impaction. 4. No evidence of acute fracture or traumatic listhesis of the cervical spine. 5. Multilevel degenerative disc disease and facet hypertrophic changes of the cervical spine, most prominent at C5-6 where there may be mild canal impingement. 6. Cervical and intracranial atherosclerosis. 7. Emphysema (ICD10-J43.9). Electronically Signed   By: Kreg Shropshire M.D.   On: 05/09/2020 03:23   CT Cervical Spine Wo Contrast  Result Date: 05/09/2020 CLINICAL DATA:  Frequent falls, hallucinations, bilateral lower extremity weakness EXAM: CT HEAD WITHOUT CONTRAST CT CERVICAL SPINE WITHOUT CONTRAST TECHNIQUE: Multidetector CT imaging of the head and cervical spine was performed following the standard protocol without intravenous contrast. Multiplanar CT image reconstructions of the cervical spine were also generated. COMPARISON:  Is CT head and cervical spine 03/11/2020 FINDINGS: CT HEAD FINDINGS Brain: No evidence of acute  infarction, hemorrhage, hydrocephalus, extra-axial collection, visible mass lesion or mass effect. Prominence of the ventricles, cisterns and sulci compatible with frontal predominant parenchymal volume loss. Patchy areas of white matter hypoattenuation are most compatible with chronic microvascular angiopathy. Vascular: Atherosclerotic calcification of the carotid siphons and left dominant intradural vertebral arteries. No hyperdense vessel. Skull: No calvarial fracture or suspicious osseous lesion. No scalp swelling or hematoma. Sinuses/Orbits: Paranasal sinuses and mastoid air cells are predominantly clear. Rightward nasal septal deviation and nasal septal spur. Middle ear cavities are clear. Extensive debris in the right external auditory canal. Included orbital structures are unremarkable. Other: Edentulous with mandibular prognathism. CT CERVICAL SPINE FINDINGS Alignment: Stabilization collar absent at the time of examination. Preservation of the normal cervical lordosis. Stepwise retrolisthesis C3-C5 is favored to be on a degenerative basis. No evidence of traumatic listhesis. No abnormally widened, perched or jumped facets. Normal alignment of the craniocervical and  atlantoaxial articulations. Skull base and vertebrae: No acute skull base fracture. No vertebral body fracture or height loss. Normal bone mineralization. No worrisome osseous lesions. Moderate arthrosis at the atlantodental and basion dens intervals. Cervical spondylitic changes as detailed below. Soft tissues and spinal canal: No pre or paravertebral fluid or swelling. No visible canal hematoma. Airways patent. Cervical carotid atherosclerosis. Disc levels: Multilevel intervertebral disc height loss with spondylitic endplate changes. Multilevel disc osteophyte complexes are present. Combination with some mild ligamentum flavum infolding at the C5-6 level there may be mild canal impingement. Remaining levels appear to efface the ventral thecal  sac without significant canal stenosis. Multilevel uncinate spurring and facet hypertrophic changes result in mild multilevel neural foraminal narrowing as well. No severe neural foraminal impingement. Upper chest: No acute abnormality in the upper chest or imaged lung apices. Emphysematous changes noted in the apices. Other: Diminutive thyroid without concerning nodules or masses. IMPRESSION: 1. No acute intracranial abnormality. 2. Frontal predominant parenchymal volume loss and chronic microvascular ischemic white matter disease. 3. Extensive debris in the right external auditory canal, correlate for cerumen impaction. 4. No evidence of acute fracture or traumatic listhesis of the cervical spine. 5. Multilevel degenerative disc disease and facet hypertrophic changes of the cervical spine, most prominent at C5-6 where there may be mild canal impingement. 6. Cervical and intracranial atherosclerosis. 7. Emphysema (ICD10-J43.9). Electronically Signed   By: Kreg ShropshirePrice  DeHay M.D.   On: 05/09/2020 03:23    ____________________________________________   PROCEDURES  Procedure(s) performed (including Critical Care):  Procedures   ____________________________________________   INITIAL IMPRESSION / ASSESSMENT AND PLAN / ED COURSE  As part of my medical decision making, I reviewed the following data within the electronic MEDICAL RECORD NUMBER History obtained from family, Nursing notes reviewed and incorporated, Labs reviewed show mild AKI, EKG interpreted NSR, Old EKG reviewed, Old chart reviewed, Radiograph reviewed and shows no acute abnormality and Notes from prior ED visits         Patient here with generalized weakness and unable to ambulate with his walker.  Reports visual hallucinations but these seem very atypical.  He does not appear to be responding to internal stimuli and denies SI or HI.  Labs obtained in triage show mild AKI.  Will give IV fluids.  He also has a mild leukocytosis.  Afebrile at  his group home as well as here in the ED.  Chest x-ray shows no acute abnormality.  Will obtain urinalysis.  Also obtain CT of the head and cervical spine given complaints of left all neck pain and his report that he has fallen several times.  No obvious sign of trauma on exam.   CT head and cervical spine showed no acute abnormality.  Patient able to ambulate with his walker with minimal assistance.  TSH elevated but free T4 normal.  Patient's urine shows large leukocytes, 6-10 red blood cells, greater than 50 white blood cells and many bacteria with no squamous cells.  Urine culture pending.  He has previously grown Serratia that was sensitive to cephalosporins.  Will give dose of Rocephin here.  I feel he is safe for discharge home with outpatient follow-up in 1 week to recheck his urine and creatinine.  He has been able to ambulate here without difficulty using a walker which is baseline.  At this time, I do not feel there is any life-threatening condition present. I have reviewed, interpreted and discussed all results (EKG, imaging, lab, urine as appropriate) and exam findings with patient/family.  I have reviewed nursing notes and appropriate previous records.  I feel the patient is safe to be discharged home without further emergent workup and can continue workup as an outpatient as needed. Discussed usual and customary return precautions. Patient/family verbalize understanding and are comfortable with this plan.  Outpatient follow-up has been provided as needed. All questions have been answered.  ____________________________________________    FINAL CLINICAL IMPRESSION(S) / ED DIAGNOSES  Final diagnoses:  Generalized weakness  AKI (acute kidney injury) (HCC)  Acute UTI     ED Discharge Orders         Ordered    cephALEXin (KEFLEX) 500 MG capsule  2 times daily        05/09/20 5638          *Please note:  Austin Marsh was evaluated in Emergency Department on 05/09/2020 for the  symptoms described in the history of present illness. He was evaluated in the context of the global COVID-19 pandemic, which necessitated consideration that the patient might be at risk for infection with the SARS-CoV-2 virus that causes COVID-19. Institutional protocols and algorithms that pertain to the evaluation of patients at risk for COVID-19 are in a state of rapid change based on information released by regulatory bodies including the CDC and federal and state organizations. These policies and algorithms were followed during the patient's care in the ED.  Some ED evaluations and interventions may be delayed as a result of limited staffing during and the pandemic.*   Note:  This document was prepared using Dragon voice recognition software and may include unintentional dictation errors.   Solae Norling, Layla Maw, DO 05/09/20 [phone]

## 2020-05-09 NOTE — ED Notes (Signed)
Pt taken to CT at this time.

## 2020-05-09 NOTE — ED Notes (Signed)
Pt ambulated via walker down hall with minimal assistance. Provider made aware.

## 2020-05-09 NOTE — Discharge Instructions (Addendum)
You may alternate Tylenol 1000 mg every 6 hours as needed for pain, fever.  Avoid NSAIDs at this time such as ibuprofen, aspirin, Aleve, Goody powders given elevated kidney function.  Your labs showed minimally elevated kidney function today of 1.69.  You will need to have this rechecked by your primary care physician in 1 week.  We have given you a liter of IV fluids to help correct this.  I recommend increasing your fluid intake at home.  Your urine also appeared infected today.  We are discharging you on antibiotics and have given you a dose of IV antibiotics in the emergency department.

## 2020-05-09 NOTE — ED Notes (Signed)
Pt back from CT at this time 

## 2020-05-09 NOTE — ED Notes (Signed)
This RN to bedside, introduced self to patient. Pt alert, calm and cooperative with staff. Pt oriented, however noted to start singing when interacting with staff. Pt noted to be laughing. Denies any needs at this time.

## 2020-05-09 NOTE — ED Notes (Signed)
This RN called and spoke with [name], owner of group home, states will be here at approx 10am to pick patient up. Pt visualized resting in bed with NAD at this time.

## 2020-05-09 NOTE — ED Notes (Signed)
D/C instructions reviewed with Gaye Alken from group home, pt assisted by this RN to get dressed back into person clothes. Pt visualized in NAD at time of D/C, pt visualized playing harmonica on way out of ED.

## 2020-05-10 LAB — T3, FREE: T3, Free: 2.2 pg/mL (ref 2.0–4.4)

## 2020-05-11 LAB — URINE CULTURE: Culture: >=100000 — AB

## 2020-05-12 NOTE — Progress Notes (Signed)
Brief Pharmacy Note  ED culture report reviewed. Urine culture with >100,000 colonies serratia. Patient was discharged on Keflex, resistant. Discussed with EDP Roxan Hockey. Will do Cipro 500 mg BID x 5 days. Prescription called into [facility]. Information discussed with patient and his nurse at [facility]. Patient was informed he could stop the Keflex once he received prescription for Cipro.  Laureen Ochs, PharmD

## 2020-07-25 ENCOUNTER — Encounter: Payer: Self-pay | Admitting: Emergency Medicine

## 2020-07-25 ENCOUNTER — Emergency Department
Admission: EM | Admit: 2020-07-25 | Discharge: 2020-07-25 | Disposition: A | Discharge status: Home / Self Care | Payer: Medicare Other | Attending: Emergency Medicine | Admitting: Emergency Medicine

## 2020-07-25 ENCOUNTER — Other Ambulatory Visit: Payer: Self-pay

## 2020-07-25 DIAGNOSIS — J449 Chronic obstructive pulmonary disease, unspecified: Secondary | ICD-10-CM | POA: Insufficient documentation

## 2020-07-25 DIAGNOSIS — R52 Pain, unspecified: Secondary | ICD-10-CM | POA: Diagnosis not present

## 2020-07-25 DIAGNOSIS — R059 Cough, unspecified: Secondary | ICD-10-CM | POA: Insufficient documentation

## 2020-07-25 DIAGNOSIS — Z79899 Other long term (current) drug therapy: Secondary | ICD-10-CM | POA: Insufficient documentation

## 2020-07-25 DIAGNOSIS — Z20822 Contact with and (suspected) exposure to covid-19: Secondary | ICD-10-CM | POA: Insufficient documentation

## 2020-07-25 DIAGNOSIS — R531 Weakness: Secondary | ICD-10-CM | POA: Diagnosis not present

## 2020-07-25 DIAGNOSIS — F17210 Nicotine dependence, cigarettes, uncomplicated: Secondary | ICD-10-CM | POA: Insufficient documentation

## 2020-07-25 DIAGNOSIS — E039 Hypothyroidism, unspecified: Secondary | ICD-10-CM | POA: Diagnosis not present

## 2020-07-25 LAB — URINALYSIS, COMPLETE (UACMP) WITH MICROSCOPIC
Bacteria, UA: NONE SEEN
Bilirubin Urine: NEGATIVE
Glucose, UA: NEGATIVE mg/dL
Ketones, ur: 5 mg/dL — AB
Leukocytes,Ua: NEGATIVE
Nitrite: NEGATIVE
Protein, ur: NEGATIVE mg/dL
Specific Gravity, Urine: 1.009 (ref 1.005–1.030)
Squamous Epithelial / HPF: NONE SEEN (ref 0–5)
pH: 6 (ref 5.0–8.0)

## 2020-07-25 LAB — CBC
HCT: 41.2 % (ref 39.0–52.0)
Hemoglobin: 13.8 g/dL (ref 13.0–17.0)
MCH: 32.6 pg (ref 26.0–34.0)
MCHC: 33.5 g/dL (ref 30.0–36.0)
MCV: 97.4 fL (ref 80.0–100.0)
Platelets: 141 10*3/uL — ABNORMAL LOW (ref 150–400)
RBC: 4.23 MIL/uL (ref 4.22–5.81)
RDW: 14.6 % (ref 11.5–15.5)
WBC: 7.4 10*3/uL (ref 4.0–10.5)
nRBC: 0 % (ref 0.0–0.2)

## 2020-07-25 LAB — COMPREHENSIVE METABOLIC PANEL
ALT: 10 U/L (ref 0–44)
AST: 17 U/L (ref 15–41)
Albumin: 2.7 g/dL — ABNORMAL LOW (ref 3.5–5.0)
Alkaline Phosphatase: 48 U/L (ref 38–126)
Anion gap: 6 (ref 5–15)
BUN: 16 mg/dL (ref 8–23)
CO2: 25 mmol/L (ref 22–32)
Calcium: 8.4 mg/dL — ABNORMAL LOW (ref 8.9–10.3)
Chloride: 110 mmol/L (ref 98–111)
Creatinine, Ser: 1.08 mg/dL (ref 0.61–1.24)
GFR, Estimated: >60 mL/min (ref >60)
Glucose, Bld: 96 mg/dL (ref 70–99)
Potassium: 4 mmol/L (ref 3.5–5.1)
Sodium: 141 mmol/L (ref 135–145)
Total Bilirubin: 0.5 mg/dL (ref 0.3–1.2)
Total Protein: 5.6 g/dL — ABNORMAL LOW (ref 6.5–8.1)

## 2020-07-25 LAB — RESP PANEL BY RT-PCR (FLU A&B, COVID) ARPGX2
Influenza A by PCR: NEGATIVE
Influenza B by PCR: NEGATIVE
SARS Coronavirus 2 by RT PCR: NEGATIVE

## 2020-07-25 LAB — TROPONIN I (HIGH SENSITIVITY): Troponin I (High Sensitivity): 6 ng/L (ref <18)

## 2020-07-25 MED ORDER — SODIUM CHLORIDE 0.9 % IV BOLUS
1000.0000 mL | Freq: Once | INTRAVENOUS | Status: AC
  Administered 2020-07-25: 1000 mL via INTRAVENOUS

## 2020-07-25 NOTE — ED Notes (Signed)
Pt wheeled to [place] and other driver/caregiver in front of lobby, discharged discussed and questions answered  Discharge instructions reviewed with patient. Questions fielded by this RN. Patient verbalizes understanding of instructions. Patient discharged home in stable condition per Derrill Kay. No acute distress noted at time of discharge.   Pt and caregiver elected not to sign for DC

## 2020-07-25 NOTE — ED Provider Notes (Signed)
[facility] Emergency Department Provider Note  Time seen: 4:06 PM  I have reviewed the triage vital signs and the nursing notes.   HISTORY  Chief Complaint Generalized Body Aches   HPI Austin Marsh is a 65 y.o. male with a past medical history of COPD, schizophrenia, tardive dyskinesia, presents to the emergency department for generalized weakness and body aches.  According to the patient for the past several weeks he has had progressively worsening generalized weakness as well as body aches.  Also states a mild cough at times.  Denies any shortness of breath or chest pain.  No abdominal pain nausea vomiting or diarrhea.  No dysuria.  When asked why the patient came today he states because the weakness has gotten so bad that he was unable to get up from bed today.   Past Medical History:  Diagnosis Date  . COPD (chronic obstructive pulmonary disease) (HCC)   . Schizoaffective disorder, bipolar type (HCC)   . Sleep apnea   . Tardive dyskinesia   . Thyroid disease     Patient Active Problem List   Diagnosis Date Noted  . Generalized weakness   . UTI (urinary tract infection) 03/11/2020  . AKI (acute kidney injury) (HCC) 03/11/2020  . Sepsis (HCC) 03/11/2020  . COPD (chronic obstructive pulmonary disease) (HCC)   . Hypothyroidism   . Depression with anxiety   . Schizoaffective disorder, bipolar type (HCC)   . Tobacco abuse   . Fall   . Scrotal pain 06/26/2018    Past Surgical History:  Procedure Laterality Date  . APPENDECTOMY      Prior to Admission medications   Medication Sig Start Date End Date Taking? Authorizing Provider  acetaminophen (TYLENOL) 325 MG tablet Take 650 mg by mouth every 4 (four) hours as needed.    [provider]  albuterol (VENTOLIN HFA) 108 (90 Base) MCG/ACT inhaler Inhale 2 puffs into the lungs every 4 (four) hours as needed for wheezing or shortness of breath. 01/23/20   Shaune Pollack, MD  alum & mag  hydroxide-simeth (MAALOX/MYLANTA) 200-200-20 MG/5ML suspension Take 30 mLs by mouth daily as needed for indigestion or heartburn.    [provider]  benztropine (COGENTIN) 2 MG tablet Take 2 mg by mouth 2 (two) times daily.    [provider]  bismuth subsalicylate (PEPTO BISMOL) 262 MG/15ML suspension Take 15 mLs by mouth every 4 (four) hours as needed.    [provider]  busPIRone (BUSPAR) 15 MG tablet Take 15 mg by mouth 2 (two) times daily.    [provider]  cephALEXin (KEFLEX) 500 MG capsule Take 1 capsule (500 mg total) by mouth 2 (two) times daily. 05/09/20   Ward, Layla Maw, DO  cloZAPine (CLOZARIL) 100 MG tablet Take 100-200 mg by mouth daily. Take 100 mg in the morning and 200 mg at bedtime for scizophrenia    [provider]  divalproex (DEPAKOTE ER) 500 MG 24 hr tablet Take 1,000 mg by mouth at bedtime.    [provider]  gabapentin (NEURONTIN) 100 MG capsule Take 200 mg by mouth at bedtime.    [provider]  guaiFENesin (ROBITUSSIN) 100 MG/5ML liquid Take 200 mg by mouth every 6 (six) hours as needed for cough.    [provider]  levothyroxine (SYNTHROID, LEVOTHROID) 75 MCG tablet Take 75 mcg by mouth daily before breakfast.    [provider]  loperamide (IMODIUM) 2 MG capsule Take 2 mg by mouth as needed  for diarrhea or loose stools.    [provider]  magnesium hydroxide (MILK OF MAGNESIA) 400 MG/5ML suspension Take 30 mLs by mouth daily as needed for mild constipation.    [provider]  neomycin-bacitracin-polymyxin (NEOSPORIN) ointment Apply 1 application topically as needed for wound care.    [provider]  omeprazole (PRILOSEC) 40 MG capsule Take 40 mg by mouth daily.  Patient not taking: Reported on 03/11/2020    [provider]  pantoprazole (PROTONIX) 40 MG tablet Take 40 mg by mouth daily.    [provider]  tamsulosin (FLOMAX) 0.4 MG CAPS  capsule Take 0.4 mg by mouth 2 (two) times daily.    [provider]  vitamin B-12 (CYANOCOBALAMIN) 100 MCG tablet Take 100 mcg by mouth daily.    [provider]  Vitamin D, Cholecalciferol, 25 MCG (1000 UT) TABS Take 50 mcg by mouth daily.     [provider]    Allergies  Allergen Reactions  . Haldol [Haloperidol Lactate] Other (See Comments)    Tardive diskonesia  . Other     [employer] cause patient to vomit  . Thorazine [Chlorpromazine] Other (See Comments)    "messes me all up"  . Trazodone And Nefazodone Other (See Comments)    "makes me feel like I'm dying."    History reviewed. No pertinent family history.  Social History Social History   Tobacco Use  . Smoking status: Current Every Day Smoker    Types: Cigarettes  . Smokeless tobacco: Never Used  Substance Use Topics  . Alcohol use: Not Currently  . Drug use: Not Currently    Review of Systems Constitutional: Negative for fever.  Positive for generalized weakness. Cardiovascular: Negative for chest pain. Respiratory: Negative for shortness of breath.  Occasional cough. Gastrointestinal: Negative for abdominal pain, vomiting and diarrhea. Genitourinary: Negative for urinary compaints Musculoskeletal: Mild body aches Skin: Negative for skin complaints  Neurological: Negative for headache All other ROS negative  ____________________________________________   PHYSICAL EXAM:  VITAL SIGNS: ED Triage Vitals [07/25/20 1548]  Enc Vitals Group     BP      Pulse      Resp      Temp      Temp src      SpO2      Weight 119 lb 14.9 oz (54.4 kg)     Height 5\' 9"  (1.753 m)     Head Circumference      Peak Flow      Pain Score      Pain Loc      Pain Edu?      Excl. in GC?    Constitutional: Alert and oriented. Well appearing and in no distress. Eyes: Normal exam ENT      Head: Normocephalic and atraumatic.      Mouth/Throat: Mucous membranes are moist. Cardiovascular: Normal  rate, regular rhythm.  Respiratory: Normal respiratory effort without tachypnea nor retractions. Breath sounds are clear  Gastrointestinal: Soft and nontender. No distention.  Musculoskeletal: Nontender with normal range of motion in all extremities. Neurologic:  Normal speech and language. No gross focal neurologic deficits Skin:  Skin is warm, dry and intact.  Psychiatric: Mood and affect are normal.   ____________________________________________    EKG  EKG viewed and interpreted by myself shows a normal sinus rhythm at 69 bpm with a narrow QRS, normal axis, normal intervals, no concerning ST changes.  ____________________________________________   INITIAL IMPRESSION / ASSESSMENT AND PLAN /  ED COURSE  Pertinent labs & imaging results that were available during my care of the patient were reviewed by me and considered in my medical decision making (see chart for details).   Patient presents emergency department for generalized weakness and body aches.  Differential is quite broad but would include dehydration, metabolic or electrolyte abnormality, infectious etiology.  We will check labs, IV hydrate obtain a Covid swab and a chest x-ray and continue to closely monitor.  Patient agreeable plan of care.  Patient's urinalysis is negative.  Lab work is largely within normal limits.  Troponin is negative.  Patient was able to stand on his own.  States he is currently working on getting a wheelchair.  Given the patient's weakness I did offer to have the patient wait in the emergency department overnight to see social work and physical therapy tomorrow.  Patient states this is an ongoing issue and he would prefer to go home.  We will discharge patient home.  Neko Boyajian was evaluated in Emergency Department on 07/25/2020 for the symptoms described in the history of present illness. He was evaluated in the context of the global COVID-19 pandemic, which necessitated consideration that the patient  might be at risk for infection with the SARS-CoV-2 virus that causes COVID-19. Institutional protocols and algorithms that pertain to the evaluation of patients at risk for COVID-19 are in a state of rapid change based on information released by regulatory bodies including the CDC and federal and state organizations. These policies and algorithms were followed during the patient's care in the ED.  ____________________________________________   FINAL CLINICAL IMPRESSION(S) / ED DIAGNOSES  Weakness   Minna Antis, MD 07/25/20 2014

## 2020-07-25 NOTE — ED Triage Notes (Addendum)
First RN Note: Pt to ED via [facility] with c/o generalized body aches, per EMS pt was seen in January for same. Per EMS c/o back pain and leg pain.   80Hr 95% 113/75  Pt with noted cough on arrival to ED. Pt also c/o decreased appetite, states pain has been ongoing, pt states decreased PO intake at this time.   Pt states lives at [facility] at [address].

## 2020-09-08 ENCOUNTER — Other Ambulatory Visit: Payer: Self-pay

## 2020-09-08 ENCOUNTER — Emergency Department: Payer: Medicare Other

## 2020-09-08 ENCOUNTER — Emergency Department
Admission: EM | Admit: 2020-09-08 | Discharge: 2020-09-08 | Disposition: A | Discharge status: Home / Self Care | Payer: Medicare Other | Attending: Emergency Medicine | Admitting: Emergency Medicine

## 2020-09-08 DIAGNOSIS — F17210 Nicotine dependence, cigarettes, uncomplicated: Secondary | ICD-10-CM | POA: Diagnosis not present

## 2020-09-08 DIAGNOSIS — R519 Headache, unspecified: Secondary | ICD-10-CM | POA: Diagnosis present

## 2020-09-08 DIAGNOSIS — E860 Dehydration: Secondary | ICD-10-CM | POA: Insufficient documentation

## 2020-09-08 DIAGNOSIS — R10817 Generalized abdominal tenderness: Secondary | ICD-10-CM | POA: Insufficient documentation

## 2020-09-08 DIAGNOSIS — R109 Unspecified abdominal pain: Secondary | ICD-10-CM

## 2020-09-08 DIAGNOSIS — G8929 Other chronic pain: Secondary | ICD-10-CM | POA: Diagnosis not present

## 2020-09-08 DIAGNOSIS — Z79899 Other long term (current) drug therapy: Secondary | ICD-10-CM | POA: Diagnosis not present

## 2020-09-08 DIAGNOSIS — J449 Chronic obstructive pulmonary disease, unspecified: Secondary | ICD-10-CM | POA: Diagnosis not present

## 2020-09-08 DIAGNOSIS — M79604 Pain in right leg: Secondary | ICD-10-CM | POA: Diagnosis not present

## 2020-09-08 DIAGNOSIS — F209 Schizophrenia, unspecified: Secondary | ICD-10-CM | POA: Diagnosis not present

## 2020-09-08 DIAGNOSIS — M79605 Pain in left leg: Secondary | ICD-10-CM | POA: Insufficient documentation

## 2020-09-08 DIAGNOSIS — E039 Hypothyroidism, unspecified: Secondary | ICD-10-CM | POA: Diagnosis not present

## 2020-09-08 DIAGNOSIS — Z20822 Contact with and (suspected) exposure to covid-19: Secondary | ICD-10-CM | POA: Diagnosis not present

## 2020-09-08 DIAGNOSIS — W182XXA Fall in (into) shower or empty bathtub, initial encounter: Secondary | ICD-10-CM | POA: Diagnosis not present

## 2020-09-08 DIAGNOSIS — D72829 Elevated white blood cell count, unspecified: Secondary | ICD-10-CM | POA: Diagnosis not present

## 2020-09-08 LAB — URINALYSIS, COMPLETE (UACMP) WITH MICROSCOPIC
Bacteria, UA: NONE SEEN
Bilirubin Urine: NEGATIVE
Glucose, UA: NEGATIVE mg/dL
Hgb urine dipstick: NEGATIVE
Ketones, ur: NEGATIVE mg/dL
Leukocytes,Ua: NEGATIVE
Nitrite: NEGATIVE
Protein, ur: NEGATIVE mg/dL
Specific Gravity, Urine: 1.014 (ref 1.005–1.030)
pH: 6 (ref 5.0–8.0)

## 2020-09-08 LAB — RESP PANEL BY RT-PCR (FLU A&B, COVID) ARPGX2
Influenza A by PCR: NEGATIVE
Influenza B by PCR: NEGATIVE
SARS Coronavirus 2 by RT PCR: NEGATIVE

## 2020-09-08 LAB — CBC WITH DIFFERENTIAL/PLATELET
Abs Immature Granulocytes: 0.09 10*3/uL — ABNORMAL HIGH (ref 0.00–0.07)
Basophils Absolute: 0 10*3/uL (ref 0.0–0.1)
Basophils Relative: 0 %
Eosinophils Absolute: 0 10*3/uL (ref 0.0–0.5)
Eosinophils Relative: 0 %
HCT: 41.5 % (ref 39.0–52.0)
Hemoglobin: 14.1 g/dL (ref 13.0–17.0)
Immature Granulocytes: 1 %
Lymphocytes Relative: 10 %
Lymphs Abs: 1.5 10*3/uL (ref 0.7–4.0)
MCH: 32.8 pg (ref 26.0–34.0)
MCHC: 34 g/dL (ref 30.0–36.0)
MCV: 96.5 fL (ref 80.0–100.0)
Monocytes Absolute: 1.8 10*3/uL — ABNORMAL HIGH (ref 0.1–1.0)
Monocytes Relative: 11 %
Neutro Abs: 12.1 10*3/uL — ABNORMAL HIGH (ref 1.7–7.7)
Neutrophils Relative %: 78 %
Platelets: 131 10*3/uL — ABNORMAL LOW (ref 150–400)
RBC: 4.3 MIL/uL (ref 4.22–5.81)
RDW: 15.3 % (ref 11.5–15.5)
WBC: 15.5 10*3/uL — ABNORMAL HIGH (ref 4.0–10.5)
nRBC: 0 % (ref 0.0–0.2)

## 2020-09-08 LAB — COMPREHENSIVE METABOLIC PANEL
ALT: 9 U/L (ref 0–44)
AST: 24 U/L (ref 15–41)
Albumin: 3 g/dL — ABNORMAL LOW (ref 3.5–5.0)
Alkaline Phosphatase: 60 U/L (ref 38–126)
Anion gap: 11 (ref 5–15)
BUN: 12 mg/dL (ref 8–23)
CO2: 23 mmol/L (ref 22–32)
Calcium: 8.8 mg/dL — ABNORMAL LOW (ref 8.9–10.3)
Chloride: 106 mmol/L (ref 98–111)
Creatinine, Ser: 1.8 mg/dL — ABNORMAL HIGH (ref 0.61–1.24)
GFR, Estimated: 41 mL/min — ABNORMAL LOW (ref >60)
Glucose, Bld: 82 mg/dL (ref 70–99)
Potassium: 3.8 mmol/L (ref 3.5–5.1)
Sodium: 140 mmol/L (ref 135–145)
Total Bilirubin: 0.9 mg/dL (ref 0.3–1.2)
Total Protein: 5.9 g/dL — ABNORMAL LOW (ref 6.5–8.1)

## 2020-09-08 LAB — POC SARS CORONAVIRUS 2 AG -  ED: SARSCOV2ONAVIRUS 2 AG: NEGATIVE

## 2020-09-08 MED ORDER — SODIUM CHLORIDE 0.9 % IV BOLUS
1000.0000 mL | Freq: Once | INTRAVENOUS | Status: AC
  Administered 2020-09-08: 1000 mL via INTRAVENOUS

## 2020-09-08 NOTE — ED Notes (Signed)
[facility]  CALLED  FOR  TRANSPORT TO  [facility]

## 2020-09-08 NOTE — ED Notes (Signed)
Paperwork given to group home driver.

## 2020-09-08 NOTE — Discharge Instructions (Signed)
Your test today were all reassuring.  Continue taking your medications, and focus on drinking lots of fluids to stay well-hydrated.

## 2020-09-08 NOTE — ED Notes (Signed)
Pt unable to sign waiver of MSE due to condition, verbalizes understanding.

## 2020-09-08 NOTE — ED Provider Notes (Signed)
[facility] Emergency Department Provider Note  ____________________________________________  Time seen: Approximately 2:04 PM  I have reviewed the triage vital signs and the nursing notes.   HISTORY  Chief Complaint Fall and Hypotension    Level 5 Caveat: Portions of the History and Physical including HPI and review of systems are unable to be completely obtained due to patient being a poor historian   HPI Austin Marsh is a 65 y.o. male with a history of COPD schizoaffective disorder who comes ED complaining of a fall today in the bathroom.  He had been dizzy when standing up, then fell to his knees, did not hit his head.  Complains of mild headache.  No neck pain.  No blood thinners.  Patient  also complains of chronic bilateral leg pain, not changed after fall.  Normal uses walker and wheelchair at baseline.  Reports that he thinks has been eating and drinking normally.  No body aches or fever.  EMS report initial blood pressure was 70/40.  They gave 500 mL saline bolus.   Past Medical History:  Diagnosis Date  . COPD (chronic obstructive pulmonary disease) (HCC)   . Schizoaffective disorder, bipolar type (HCC)   . Sleep apnea   . Tardive dyskinesia   . Thyroid disease      Patient Active Problem List   Diagnosis Date Noted  . Generalized weakness   . UTI (urinary tract infection) 03/11/2020  . AKI (acute kidney injury) (HCC) 03/11/2020  . Sepsis (HCC) 03/11/2020  . COPD (chronic obstructive pulmonary disease) (HCC)   . Hypothyroidism   . Depression with anxiety   . Schizoaffective disorder, bipolar type (HCC)   . Tobacco abuse   . Fall   . Scrotal pain 06/26/2018     Past Surgical History:  Procedure Laterality Date  . APPENDECTOMY       Prior to Admission medications   Medication Sig Start Date End Date Taking? Authorizing Provider  acetaminophen (TYLENOL) 325 MG tablet Take 650 mg by mouth every 4 (four) hours as needed.     [provider]  albuterol (VENTOLIN HFA) 108 (90 Base) MCG/ACT inhaler Inhale 2 puffs into the lungs every 4 (four) hours as needed for wheezing or shortness of breath. 01/23/20   Shaune Pollack, MD  alum & mag hydroxide-simeth (MAALOX/MYLANTA) 200-200-20 MG/5ML suspension Take 30 mLs by mouth daily as needed for indigestion or heartburn.    [provider]  benztropine (COGENTIN) 2 MG tablet Take 2 mg by mouth 2 (two) times daily.    [provider]  bismuth subsalicylate (PEPTO BISMOL) 262 MG/15ML suspension Take 15 mLs by mouth every 4 (four) hours as needed.    [provider]  busPIRone (BUSPAR) 15 MG tablet Take 15 mg by mouth 2 (two) times daily.    [provider]  cephALEXin (KEFLEX) 500 MG capsule Take 1 capsule (500 mg total) by mouth 2 (two) times daily. 05/09/20   Ward, Layla Maw, DO  cloZAPine (CLOZARIL) 100 MG tablet Take 100-200 mg by mouth daily. Take 100 mg in the morning and 200 mg at bedtime for scizophrenia    [provider]  divalproex (DEPAKOTE ER) 500 MG 24 hr tablet Take 1,000 mg by mouth at bedtime.    [provider]  gabapentin (NEURONTIN) 100 MG capsule Take 200 mg by mouth at bedtime.    [provider]  guaiFENesin (ROBITUSSIN) 100 MG/5ML liquid Take 200 mg by mouth every 6 (six) hours as needed  for cough.    [provider]  levothyroxine (SYNTHROID, LEVOTHROID) 75 MCG tablet Take 75 mcg by mouth daily before breakfast.    [provider]  loperamide (IMODIUM) 2 MG capsule Take 2 mg by mouth as needed for diarrhea or loose stools.    [provider]  magnesium hydroxide (MILK OF MAGNESIA) 400 MG/5ML suspension Take 30 mLs by mouth daily as needed for mild constipation.    [provider]  neomycin-bacitracin-polymyxin (NEOSPORIN) ointment Apply 1 application topically as needed for wound care.    [provider]  omeprazole (PRILOSEC) 40 MG capsule Take  40 mg by mouth daily.  Patient not taking: Reported on 03/11/2020    [provider]  pantoprazole (PROTONIX) 40 MG tablet Take 40 mg by mouth daily.    [provider]  tamsulosin (FLOMAX) 0.4 MG CAPS capsule Take 0.4 mg by mouth 2 (two) times daily.    [provider]  vitamin B-12 (CYANOCOBALAMIN) 100 MCG tablet Take 100 mcg by mouth daily.    [provider]  Vitamin D, Cholecalciferol, 25 MCG (1000 UT) TABS Take 50 mcg by mouth daily.     [provider]     Allergies Haldol [haloperidol lactate], Other, Thorazine [chlorpromazine], and Trazodone and nefazodone   No family history on file.  Social History Social History   Tobacco Use  . Smoking status: Current Every Day Smoker    Types: Cigarettes  . Smokeless tobacco: Never Used  Substance Use Topics  . Alcohol use: Not Currently  . Drug use: Not Currently    Review of Systems Level 5 Caveat: Portions of the History and Physical including HPI and review of systems are unable to be completely obtained due to patient being a poor historian   Constitutional:   No known fever.  ENT:   No rhinorrhea. Cardiovascular:   No chest pain or syncope. Respiratory:   No dyspnea or cough. Gastrointestinal:   Negative for abdominal pain, vomiting and diarrhea.  Musculoskeletal:   Negative for focal pain or swelling ____________________________________________   PHYSICAL EXAM:  VITAL SIGNS: ED Triage Vitals  Enc Vitals Group     BP 09/08/20 1032 104/67     Pulse Rate 09/08/20 1032 91     Resp 09/08/20 1032 20     Temp 09/08/20 1029 98.1 F (36.7 C)     Temp Source 09/08/20 1029 Oral     SpO2 09/08/20 1038 99 %     Weight 09/08/20 1029 121 lb 4.1 oz (55 kg)     Height 09/08/20 1029 5\' 9"  (1.753 m)     Head Circumference --      Peak Flow --      Pain Score 09/08/20 1029 1     Pain Loc --      Pain Edu? --      Excl. in GC? --     Vital signs reviewed, nursing assessments  reviewed.   Constitutional:   Alert and oriented. Non-toxic appearance. Eyes:   Conjunctivae are normal. EOMI. PERRL. ENT      Head:   Normocephalic and atraumatic.      Nose:   No congestion/rhinnorhea.       Mouth/Throat:   Dry mucous membranes, no pharyngeal erythema. No peritonsillar mass.       Neck:   No meningismus. Full ROM. Hematological/Lymphatic/Immunilogical:   No cervical lymphadenopathy. Cardiovascular:   RRR. Symmetric bilateral radial and DP pulses.  No murmurs. Cap refill less  than 2 seconds. Respiratory:   Normal respiratory effort without tachypnea/retractions. Breath sounds are clear and equal bilaterally. No wheezes/rales/rhonchi. Gastrointestinal:   Soft with mild generalized tenderness. Non distended. There is no CVA tenderness.  No rebound, rigidity, or guarding. Genitourinary:   deferred Musculoskeletal:   Normal range of motion in all extremities. No joint effusions.  No lower extremity tenderness.  No edema.  No lacerations or bony point tenderness.  Symmetric calf circumference Neurologic:   Normal speech and language.  Motor grossly intact. No acute focal neurologic deficits are appreciated.  Skin:    Skin is warm, dry and intact. No rash noted.  No petechiae, purpura, or bullae.  ____________________________________________    LABS (pertinent positives/negatives) (all labs ordered are listed, but only abnormal results are displayed) Labs Reviewed  COMPREHENSIVE METABOLIC PANEL - Abnormal; Notable for the following components:      Result Value   Creatinine, Ser 1.80 (*)    Calcium 8.8 (*)    Total Protein 5.9 (*)    Albumin 3.0 (*)    GFR, Estimated 41 (*)    All other components within normal limits  CBC WITH DIFFERENTIAL/PLATELET - Abnormal; Notable for the following components:   WBC 15.5 (*)    Platelets 131 (*)    Neutro Abs 12.1 (*)    Monocytes Absolute 1.8 (*)    Abs Immature Granulocytes 0.09 (*)    All other components within normal  limits  URINALYSIS, COMPLETE (UACMP) WITH MICROSCOPIC - Abnormal; Notable for the following components:   Color, Urine YELLOW (*)    APPearance CLEAR (*)    All other components within normal limits  RESP PANEL BY RT-PCR (FLU A&B, COVID) [id number]  POC SARS CORONAVIRUS 2 AG -  ED   ____________________________________________   EKG Interpreted by me Sinus rhythm rate of 91, normal axis and intervals. Nl qrs, st segments, and t waves.   ____________________________________________    RADIOLOGY  DG Chest Portable 1 View  Result Date: 09/08/2020 CLINICAL DATA:  Weakness EXAM: PORTABLE CHEST 1 VIEW COMPARISON:  05/08/2020 FINDINGS: Poor inspiration. Artifact overlies the chest. Allowing for these factors, the heart and mediastinal shadows appear normal. The lungs are clear. No infiltrate, collapse or effusion. IMPRESSION: Poor inspiration.  No active disease suspected. Electronically Signed   By: Paulina Fusi M.D.   On: 09/08/2020 12:40    ____________________________________________   PROCEDURES Procedures  ____________________________________________  DIFFERENTIAL DIAGNOSIS   Dehydration, electrolyte abnormality, pneumonia, viral illness  CLINICAL IMPRESSION / ASSESSMENT AND PLAN / ED COURSE  Medications ordered in the ED: Medications  sodium chloride 0.9 % bolus 1,000 mL (1,000 mLs Intravenous New Bag/Given 09/08/20 1302)    Pertinent labs & imaging results that were available during my care of the patient were reviewed by me and considered in my medical decision making (see chart for details).   Austin Marsh was evaluated in Emergency Department on 09/08/2020 for the symptoms described in the history of present illness. He was evaluated in the context of the global COVID-19 pandemic, which necessitated consideration that the patient might be at risk for infection with the SARS-CoV-2 virus that causes COVID-19. Institutional protocols and algorithms that pertain to the  evaluation of patients at risk for COVID-19 are in a state of rapid change based on information released by regulatory bodies including the CDC and federal and state organizations. These policies and algorithms were followed during the patient's care in the ED.   Patient brought to ED due to dizziness  with standing and fall.  No serious injuries.  Vital signs are normal, exam suggestive of dehydration.  Recent daily temperatures have been in the mid 90s degrees.  Labs show elevation of creatinine, mild leukocytosis of 15,000, urinalysis normal.  COVID and flu negative.  Due to diagnostic uncertainty with poor history availability with, will obtain CT scan of the chest abdomen pelvis to evaluate for occult pneumonia or intra-abdominal pathology.  If reassuring and orthostatics are improved after IV fluids, patient can be discharged back to group home.      ____________________________________________   FINAL CLINICAL IMPRESSION(S) / ED DIAGNOSES    Final diagnoses:  Dehydration  Schizophrenia, unspecified type [facility])     ED Discharge Orders    None      Portions of this note were generated with dragon dictation software. Dictation errors may occur despite best attempts at proofreading.   Sharman Cheek, MD 09/08/20 [phone]

## 2020-09-08 NOTE — ED Triage Notes (Addendum)
Pt to ER via [facility] from [facility] after a witness fall today in the bathroom. Pt reports using a wheelchair and a walker at baseline.   Facility reports pt reported feeling dizzy then fell to knees. Pt unsure if he hit his head. No hx of taking blood thinners.  Ems BP on arrival 70/40. After 500cc NS bolus pt BP 99/67. cbg 109, HR 99, RR 20. Pt placed on 2L via [place] for O2 room air sats of 91%.   Pt reports bilateral leg pain and headache.

## 2021-01-14 IMAGING — US US SCROTUM
1 series · 14 of 25 positions shown · non-contrast
Comparison: None.

CLINICAL DATA: Left testicular pain and swelling for the past week.

EXAM:
SCROTAL ULTRASOUND
DOPPLER ULTRASOUND OF THE TESTICLES
TECHNIQUE: Complete ultrasound examination of the testicles, epididymis, and
other scrotal structures was performed. Color and spectral Doppler
ultrasound were also utilized to evaluate blood flow to the
testicles.

[Series 1: us scrotum · 0.08mm/px · 66 acquisitions, 14 frames shown]
[im 1/66]
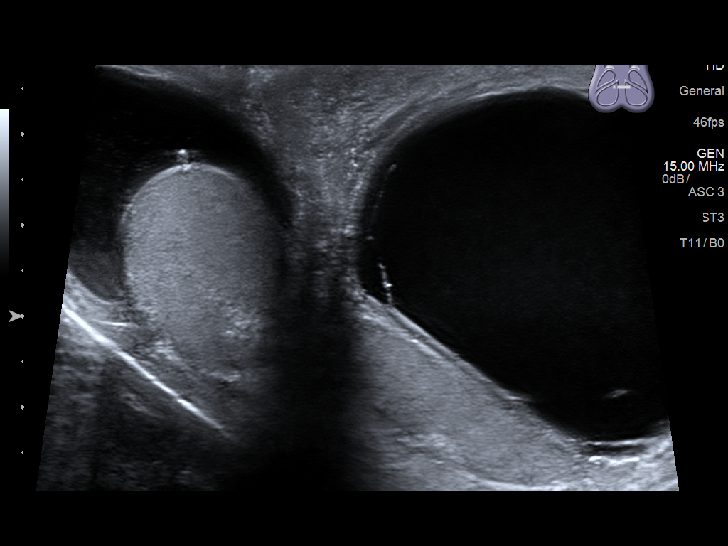
[im 6/66]
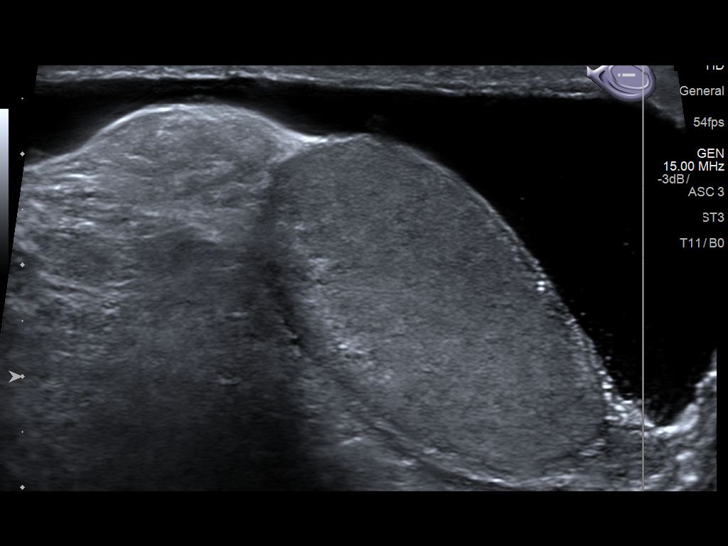
[im 11/66]
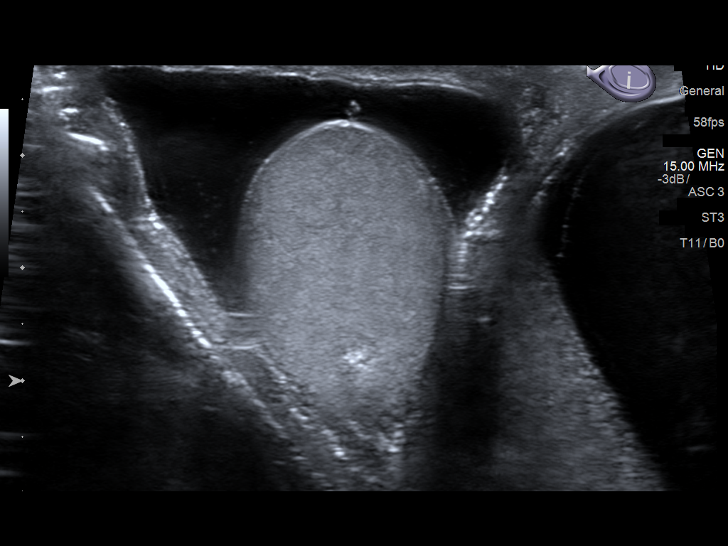
[im 17/66]
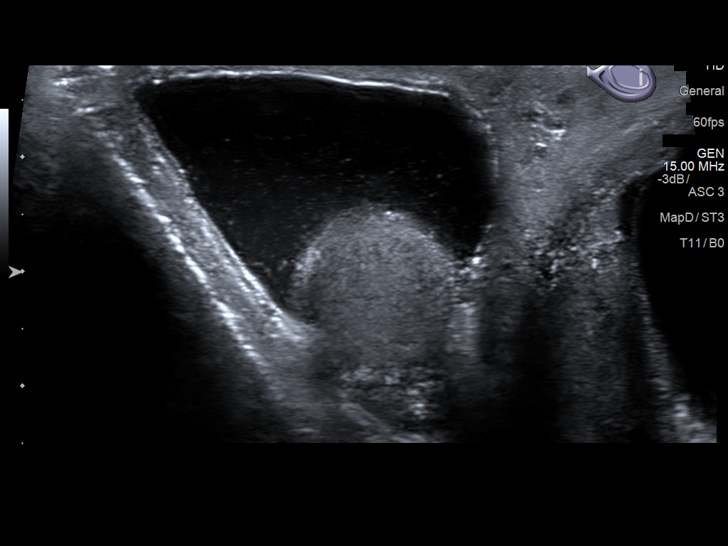
[im 22/66]
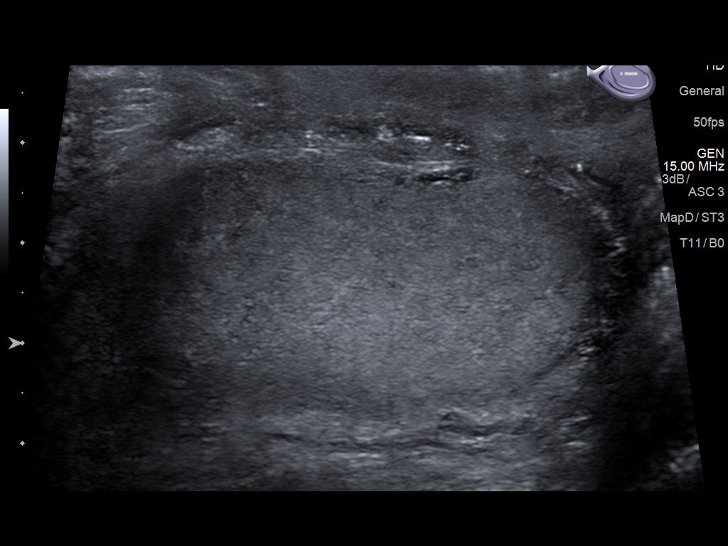
[im 25/66]
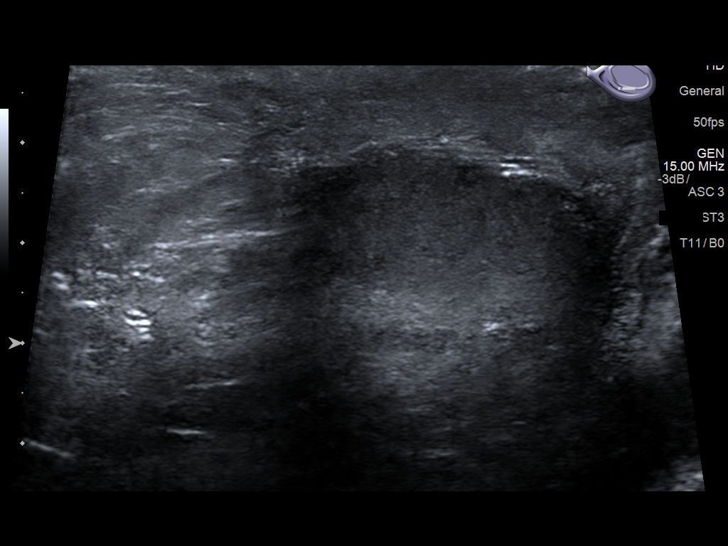
[im 30/66]
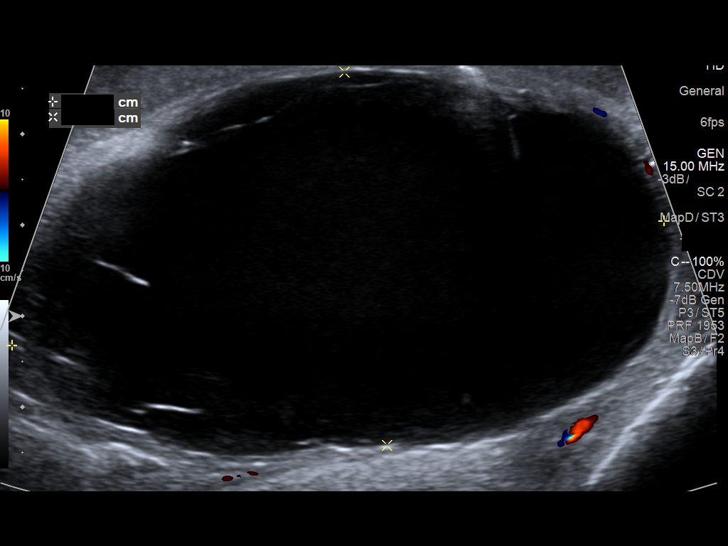
[im 36/66]
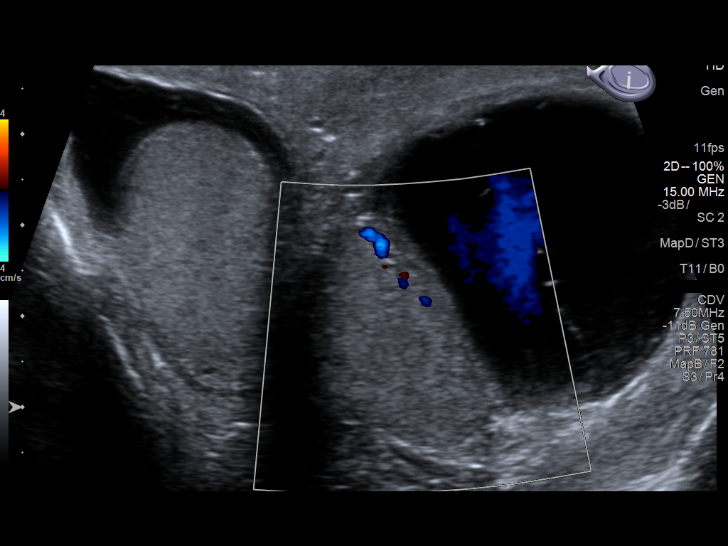
[im 41/66]
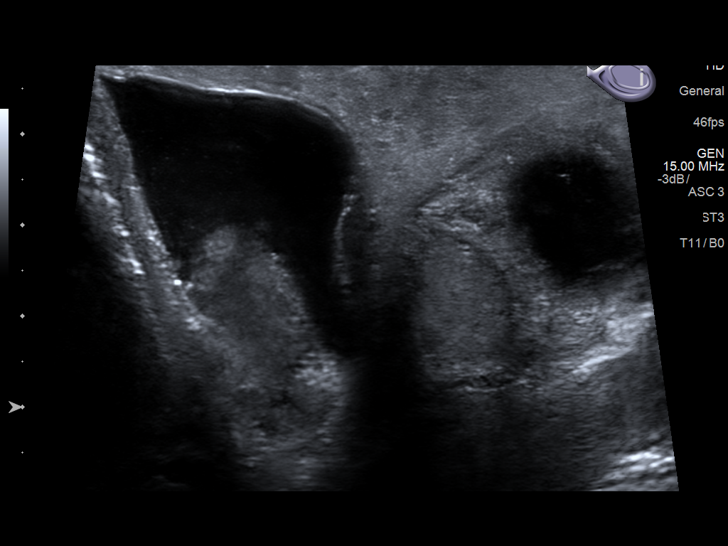
[im 44/66]
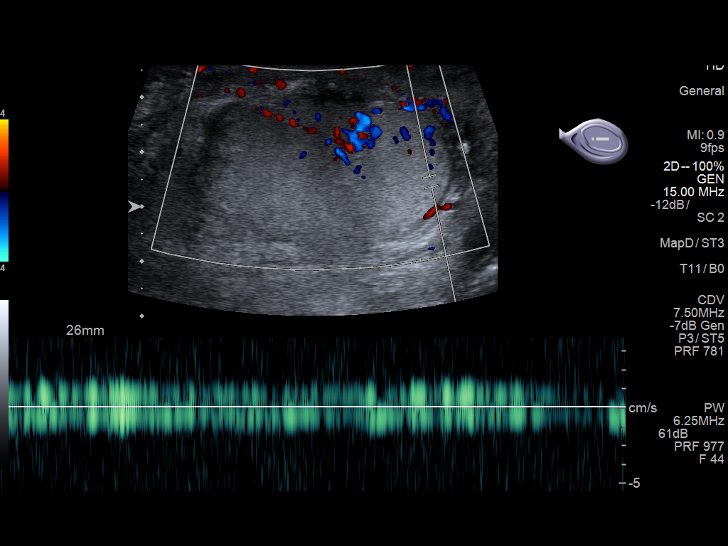
[im 49/66]
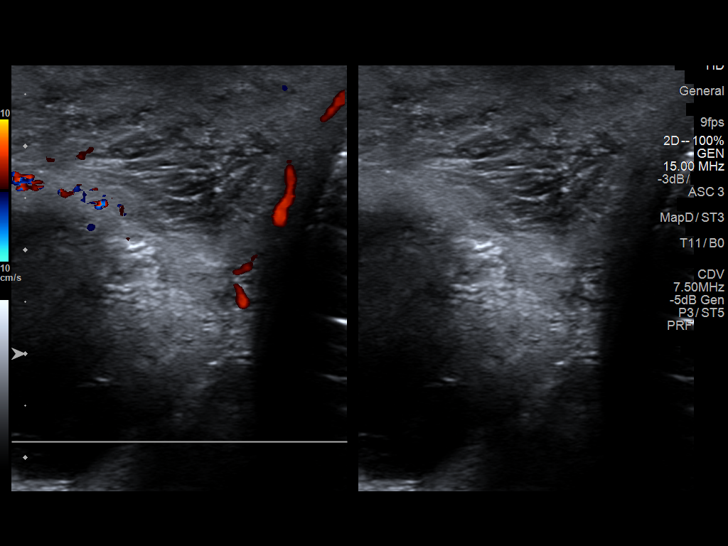
[im 55/66]
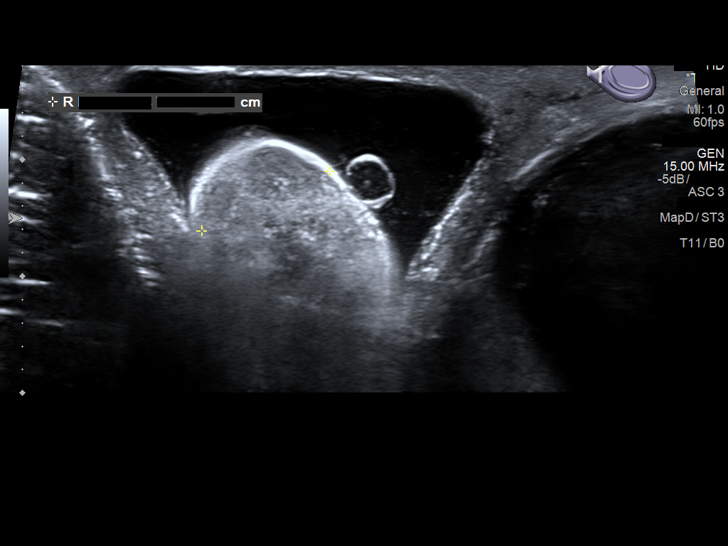
[im 60/66]
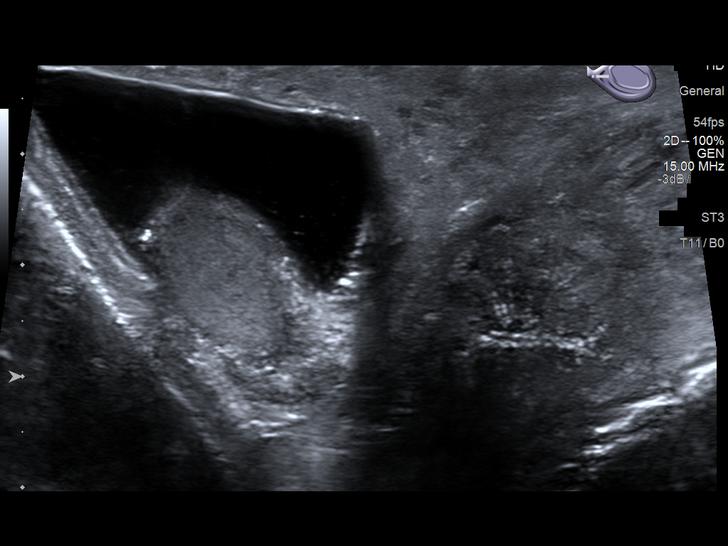
[im 66/66]
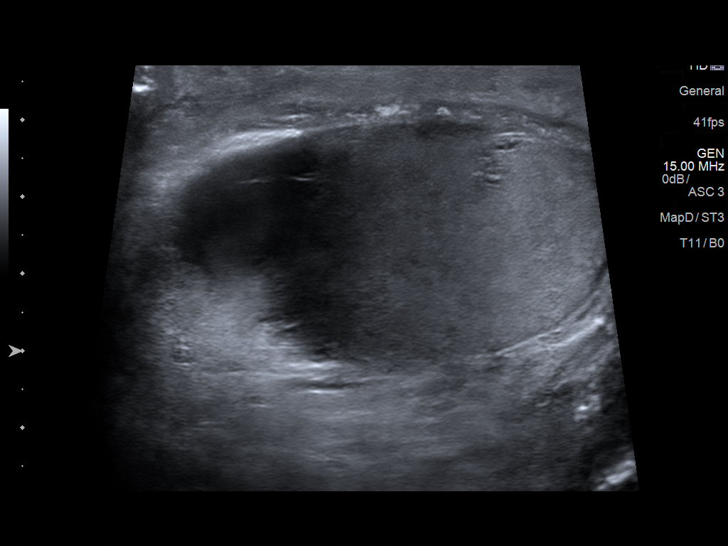

[14 of 25 positions shown; findings below may reference images not displayed]

FINDINGS: Right testicle

Measurements: 3.7 x 2.1 x 1.9 cm. No mass or microlithiasis
visualized.

Left testicle

Measurements: 4.7 x 2.5 x 1.6 cm. No mass or microlithiasis
visualized.

Right epididymis: Normal in size and appearance. Small epididymal
cysts measuring up to 5 mm.

Left epididymis:  Normal in size and appearance.

Hydrocele: Minimally complicated large left hydrocele with a few
thin internal septations. Small right hydrocele.

Varicocele:  None visualized.

Pulsed Doppler interrogation of both testes demonstrates normal low
resistance arterial and venous waveforms bilaterally.
IMPRESSION: 1. Minimally complicated large left hydrocele.
2. Small right hydrocele.
3. Normal sonographic appearance of the testicles.

## 2021-06-01 ENCOUNTER — Other Ambulatory Visit: Payer: Self-pay

## 2021-06-01 ENCOUNTER — Emergency Department
Admission: EM | Admit: 2021-06-01 | Discharge: 2021-06-01 | Disposition: A | Discharge status: Home / Self Care | Payer: Medicare Other | Attending: Emergency Medicine | Admitting: Emergency Medicine

## 2021-06-01 ENCOUNTER — Emergency Department: Payer: Medicare Other

## 2021-06-01 DIAGNOSIS — N390 Urinary tract infection, site not specified: Secondary | ICD-10-CM

## 2021-06-01 DIAGNOSIS — J449 Chronic obstructive pulmonary disease, unspecified: Secondary | ICD-10-CM | POA: Insufficient documentation

## 2021-06-01 DIAGNOSIS — R1032 Left lower quadrant pain: Secondary | ICD-10-CM | POA: Diagnosis present

## 2021-06-01 DIAGNOSIS — K5900 Constipation, unspecified: Secondary | ICD-10-CM | POA: Diagnosis not present

## 2021-06-01 LAB — COMPREHENSIVE METABOLIC PANEL
ALT: 9 U/L (ref 0–44)
AST: 16 U/L (ref 15–41)
Albumin: 3 g/dL — ABNORMAL LOW (ref 3.5–5.0)
Alkaline Phosphatase: 71 U/L (ref 38–126)
Anion gap: 8 (ref 5–15)
BUN: 14 mg/dL (ref 8–23)
CO2: 24 mmol/L (ref 22–32)
Calcium: 8.7 mg/dL — ABNORMAL LOW (ref 8.9–10.3)
Chloride: 104 mmol/L (ref 98–111)
Creatinine, Ser: 1.19 mg/dL (ref 0.61–1.24)
GFR, Estimated: >60 mL/min (ref >60)
Glucose, Bld: 101 mg/dL — ABNORMAL HIGH (ref 70–99)
Potassium: 4 mmol/L (ref 3.5–5.1)
Sodium: 136 mmol/L (ref 135–145)
Total Bilirubin: 0.8 mg/dL (ref 0.3–1.2)
Total Protein: 6.3 g/dL — ABNORMAL LOW (ref 6.5–8.1)

## 2021-06-01 LAB — URINALYSIS, ROUTINE W REFLEX MICROSCOPIC
Bilirubin Urine: NEGATIVE
Glucose, UA: NEGATIVE mg/dL
Ketones, ur: 5 mg/dL — AB
Nitrite: POSITIVE — AB
Protein, ur: NEGATIVE mg/dL
Specific Gravity, Urine: 1.019 (ref 1.005–1.030)
Squamous Epithelial / HPF: NONE SEEN (ref 0–5)
WBC, UA: >50 WBC/hpf — ABNORMAL HIGH (ref 0–5)
pH: 5 (ref 5.0–8.0)

## 2021-06-01 LAB — LIPASE, BLOOD: Lipase: 30 U/L (ref 11–51)

## 2021-06-01 LAB — CBC
HCT: 40.7 % (ref 39.0–52.0)
Hemoglobin: 13.3 g/dL (ref 13.0–17.0)
MCH: 32.5 pg (ref 26.0–34.0)
MCHC: 32.7 g/dL (ref 30.0–36.0)
MCV: 99.5 fL (ref 80.0–100.0)
Platelets: 170 10*3/uL (ref 150–400)
RBC: 4.09 MIL/uL — ABNORMAL LOW (ref 4.22–5.81)
RDW: 15.2 % (ref 11.5–15.5)
WBC: 15.2 10*3/uL — ABNORMAL HIGH (ref 4.0–10.5)
nRBC: 0 % (ref 0.0–0.2)

## 2021-06-01 MED ORDER — IOHEXOL 300 MG/ML  SOLN
100.0000 mL | Freq: Once | INTRAMUSCULAR | Status: AC | PRN
  Administered 2021-06-01: 100 mL via INTRAVENOUS
  Filled 2021-06-01: qty 100

## 2021-06-01 MED ORDER — CEPHALEXIN 500 MG PO CAPS: 500.0000 mg | ORAL_CAPSULE | Freq: Four times a day (QID) | ORAL | 0 refills | Status: AC

## 2021-06-01 MED ORDER — FLEET ENEMA 7-19 GM/118ML RE ENEM
1.0000 | ENEMA | Freq: Once | RECTAL | Status: AC
  Administered 2021-06-01: 1 via RECTAL

## 2021-06-01 MED ORDER — POLYETHYLENE GLYCOL 3350 17 G PO PACK: 17.0000 g | PACK | Freq: Every day | ORAL | 0 refills | Status: DC

## 2021-06-01 MED ORDER — CEPHALEXIN 500 MG PO CAPS
500.0000 mg | ORAL_CAPSULE | Freq: Once | ORAL | Status: AC
  Administered 2021-06-01: 500 mg via ORAL
  Filled 2021-06-01: qty 1

## 2021-06-01 NOTE — ED Provider Notes (Signed)
[facility] Provider Note    Event Date/Time   First MD Initiated Contact with Patient 06/01/21 1628     (approximate)   History   Abdominal Pain   HPI  Austin Marsh is a 66 y.o. male with a history of schizoaffective disorder, COPD, who presents with complaints of constipation, abdominal discomfort.  Complains primarily of left lower quadrant right lower quadrant abdominal pain.  No bowel movement in 4 days reportedly.  Does not take anything for this besides a stool softener.  No fevers or chills.  No significant nausea or vomiting      Physical Exam   Triage Vital Signs: ED Triage Vitals  Enc Vitals Group     BP 06/01/21 1345 107/73     Pulse Rate 06/01/21 1345 88     Resp 06/01/21 1345 19     Temp 06/01/21 1345 98.3 F (36.8 C)     Temp Source 06/01/21 1345 Oral     SpO2 06/01/21 1345 97 %     Weight 06/01/21 1345 81.6 kg (180 lb)     Height 06/01/21 1345 1.753 m (5\' 9" )     Head Circumference --      Peak Flow --      Pain Score 06/01/21 1342 5     Pain Loc --      Pain Edu? --      Excl. in [place]? --     Most recent vital signs: Vitals:   06/01/21 1345 06/01/21 1651  BP: 107/73 129/89  Pulse: 88 78  Resp: 19 18  Temp: 98.3 F (36.8 C)   SpO2: 97% 100%     General: Awake, no distress.  CV:  Good peripheral perfusion.  Resp:  Normal effort.  Abd:  No distention. Mild ttp llq, rlq, no cva ttp Other:     ED Results / Procedures / Treatments   Labs (all labs ordered are listed, but only abnormal results are displayed) Labs Reviewed  COMPREHENSIVE METABOLIC PANEL - Abnormal; Notable for the following components:      Result Value   Glucose, Bld 101 (*)    Calcium 8.7 (*)    Total Protein 6.3 (*)    Albumin 3.0 (*)    All other components within normal limits  CBC - Abnormal; Notable for the following components:   WBC 15.2 (*)    RBC 4.09 (*)    All other components within normal limits  URINALYSIS, ROUTINE W REFLEX  MICROSCOPIC - Abnormal; Notable for the following components:   Color, Urine AMBER (*)    APPearance CLOUDY (*)    Hgb urine dipstick MODERATE (*)    Ketones, ur 5 (*)    Nitrite POSITIVE (*)    Leukocytes,Ua MODERATE (*)    WBC, UA >50 (*)    Bacteria, UA MANY (*)    All other components within normal limits  URINE CULTURE  LIPASE, BLOOD     EKG     RADIOLOGY CT abdomen pelvis reviewed by me, consistent with constipation    PROCEDURES:  Critical Care performed:   Procedures   MEDICATIONS ORDERED IN ED: Medications  iohexol (OMNIPAQUE) 300 MG/ML solution 100 mL (100 mLs Intravenous Contrast Given 06/01/21 1725)  sodium phosphate (FLEET) 7-19 GM/118ML enema 1 enema (1 enema Rectal Given 06/01/21 1850)  cephALEXin (KEFLEX) capsule 500 mg (500 mg Oral Given 06/01/21 1951)     IMPRESSION / MDM / [place] / ED COURSE  I  reviewed the triage vital signs and the nursing notes.  Patient presents with complaints of constipation as detailed above but also tenderness to palpation of left lower quadrant and right lower quadrant.  CMP overall reassuring, LFTs unremarkable.  CBC demonstrates elevation of white blood cell count which is nonspecific but given his abdominal pain will obtain CT imaging.  Urinalysis suspicious for urinary tract infection  Patient signed out to my colleague pending CT results, anticipate discharge          FINAL CLINICAL IMPRESSION(S) / ED DIAGNOSES   Final diagnoses:  Constipation, unspecified constipation type  Urinary tract infection without hematuria, site unspecified     Rx / DC Orders   ED Discharge Orders          Ordered    cephALEXin (KEFLEX) 500 MG capsule  4 times daily        06/01/21 1950    polyethylene glycol (MIRALAX) 17 g packet  Daily        06/01/21 1950             Note:  This document was prepared using Dragon voice recognition software and may include unintentional dictation errors.    Lavonia Drafts, MD 06/01/21 2106

## 2021-06-01 NOTE — Discharge Instructions (Addendum)
Please STOP taking the Imodium as this is making you constipated.  Please start taking the MiraLAX once daily.  You also have a urinary tract infection.  You should take the antibiotic 4 times a day for the next 7 days.  Please follow-up with your primary care provider.

## 2021-06-01 NOTE — ED Notes (Signed)
Patient given enema. Able to hold in about 10 minutes. Patient had liquid output after 10 minutes. Reports he feels like having a BM. Patient placed on bedpan. Report given off to oncoming nurse Katie that patient is on bedpan.

## 2021-06-01 NOTE — ED Triage Notes (Signed)
Pt comes via EMS with c/o 4 days of belly pain. Pt has not had BM in 4 days.   VSS per EMS

## 2021-06-01 NOTE — ED Provider Notes (Signed)
Patient signed out to me pending CT read.  He is a 66 year old male presenting with constipation and nonspecific abdominal pain.  Labs notable for leukocytosis of 15.  I reviewed the patient's CT which is consistent with constipation but no other acute process.  Patient was given an enema in the ED and was able to have a significantly large bowel movement.  UA also looks dirty and patient telling me he has frequency and burning with urination so we will treat as complicated UTI with 7 days of Keflex.  On review of patient's medications he is prescribed Imodium for diarrhea which she is taking.  I advised that he stop taking the Imodium given he is now constipated.  We will also prescribe MiraLAX.  He is stable for discharge.   Rada Hay, MD 06/01/21 [phone]

## 2021-06-04 LAB — URINE CULTURE: Culture: >=100000 — AB

## 2021-12-11 ENCOUNTER — Emergency Department
Admission: EM | Admit: 2021-12-11 | Discharge: 2021-12-11 | Disposition: A | Discharge status: Home / Self Care | Payer: Medicare Other | Attending: Emergency Medicine | Admitting: Emergency Medicine

## 2021-12-11 ENCOUNTER — Other Ambulatory Visit: Payer: Self-pay

## 2021-12-11 ENCOUNTER — Encounter: Payer: Self-pay | Admitting: Emergency Medicine

## 2021-12-11 ENCOUNTER — Emergency Department: Payer: Medicare Other

## 2021-12-11 DIAGNOSIS — E039 Hypothyroidism, unspecified: Secondary | ICD-10-CM | POA: Diagnosis not present

## 2021-12-11 DIAGNOSIS — Z20822 Contact with and (suspected) exposure to covid-19: Secondary | ICD-10-CM | POA: Insufficient documentation

## 2021-12-11 DIAGNOSIS — R058 Other specified cough: Secondary | ICD-10-CM

## 2021-12-11 DIAGNOSIS — I959 Hypotension, unspecified: Secondary | ICD-10-CM | POA: Diagnosis not present

## 2021-12-11 DIAGNOSIS — R059 Cough, unspecified: Secondary | ICD-10-CM | POA: Diagnosis present

## 2021-12-11 DIAGNOSIS — R0602 Shortness of breath: Secondary | ICD-10-CM | POA: Diagnosis not present

## 2021-12-11 DIAGNOSIS — N3001 Acute cystitis with hematuria: Secondary | ICD-10-CM | POA: Insufficient documentation

## 2021-12-11 DIAGNOSIS — J449 Chronic obstructive pulmonary disease, unspecified: Secondary | ICD-10-CM | POA: Diagnosis not present

## 2021-12-11 LAB — URINALYSIS, ROUTINE W REFLEX MICROSCOPIC
Bilirubin Urine: NEGATIVE
Glucose, UA: NEGATIVE mg/dL
Ketones, ur: 5 mg/dL — AB
Nitrite: NEGATIVE
Protein, ur: NEGATIVE mg/dL
Specific Gravity, Urine: 1.016 (ref 1.005–1.030)
pH: 6 (ref 5.0–8.0)

## 2021-12-11 LAB — COMPREHENSIVE METABOLIC PANEL
ALT: 9 U/L (ref 0–44)
AST: 17 U/L (ref 15–41)
Albumin: 3.9 g/dL (ref 3.5–5.0)
Alkaline Phosphatase: 61 U/L (ref 38–126)
Anion gap: 10 (ref 5–15)
BUN: 14 mg/dL (ref 8–23)
CO2: 26 mmol/L (ref 22–32)
Calcium: 9.7 mg/dL (ref 8.9–10.3)
Chloride: 100 mmol/L (ref 98–111)
Creatinine, Ser: 1.31 mg/dL — ABNORMAL HIGH (ref 0.61–1.24)
GFR, Estimated: 60 mL/min — ABNORMAL LOW (ref >60)
Glucose, Bld: 96 mg/dL (ref 70–99)
Potassium: 4.2 mmol/L (ref 3.5–5.1)
Sodium: 136 mmol/L (ref 135–145)
Total Bilirubin: 1 mg/dL (ref 0.3–1.2)
Total Protein: 7.5 g/dL (ref 6.5–8.1)

## 2021-12-11 LAB — CBC WITH DIFFERENTIAL/PLATELET
Abs Immature Granulocytes: 0.08 10*3/uL — ABNORMAL HIGH (ref 0.00–0.07)
Basophils Absolute: 0 10*3/uL (ref 0.0–0.1)
Basophils Relative: 0 %
Eosinophils Absolute: 0 10*3/uL (ref 0.0–0.5)
Eosinophils Relative: 0 %
HCT: 45.3 % (ref 39.0–52.0)
Hemoglobin: 15.4 g/dL (ref 13.0–17.0)
Immature Granulocytes: 1 %
Lymphocytes Relative: 25 %
Lymphs Abs: 2.9 10*3/uL (ref 0.7–4.0)
MCH: 32.4 pg (ref 26.0–34.0)
MCHC: 34 g/dL (ref 30.0–36.0)
MCV: 95.2 fL (ref 80.0–100.0)
Monocytes Absolute: 1.1 10*3/uL — ABNORMAL HIGH (ref 0.1–1.0)
Monocytes Relative: 10 %
Neutro Abs: 7.2 10*3/uL (ref 1.7–7.7)
Neutrophils Relative %: 64 %
Platelets: 137 10*3/uL — ABNORMAL LOW (ref 150–400)
RBC: 4.76 MIL/uL (ref 4.22–5.81)
RDW: 14.1 % (ref 11.5–15.5)
WBC: 11.4 10*3/uL — ABNORMAL HIGH (ref 4.0–10.5)
nRBC: 0 % (ref 0.0–0.2)

## 2021-12-11 LAB — RESP PANEL BY RT-PCR (FLU A&B, COVID) ARPGX2
Influenza A by PCR: NEGATIVE
Influenza B by PCR: NEGATIVE
SARS Coronavirus 2 by RT PCR: NEGATIVE

## 2021-12-11 LAB — TROPONIN I (HIGH SENSITIVITY): Troponin I (High Sensitivity): 7 ng/L (ref <18)

## 2021-12-11 LAB — T4, FREE: Free T4: 1.09 ng/dL (ref 0.61–1.12)

## 2021-12-11 MED ORDER — CEFDINIR 300 MG PO CAPS: 300.0000 mg | ORAL_CAPSULE | Freq: Two times a day (BID) | ORAL | 0 refills | Status: AC

## 2021-12-11 NOTE — ED Notes (Signed)
Patient is resting comfortably. 

## 2021-12-11 NOTE — ED Triage Notes (Signed)
Pt reports shortness of breath for 2 weeks, cough productive yellow, green sputum

## 2021-12-11 NOTE — ED Notes (Signed)
First Nurse Note: Pt to ED via EMS from [facility] for generalized weakness. Pt answers orientation questions correctly but has moments of confusion per EMS. Pt did have a fall last night. Pt is unsure if he hit his head. Pt VSS at this time.  CBG 89

## 2021-12-11 NOTE — ED Notes (Signed)
RN attempted to call report to facility. No answer.

## 2021-12-11 NOTE — ED Provider Notes (Signed)
[facility] Provider Note   Event Date/Time   First MD Initiated Contact with Patient 12/11/21 1925     (approximate) History  Hypotension and Shortness of Breath  HPI Austin Marsh is a 66 y.o. male with past medical history of of COPD, hypothyroidism, and schizoaffective disorder who presents for shortness of breath over the last 2 weeks with cough productive of yellow-green sputum.  Patient is coming from a group home and states that there has been a few COVID cases.  Patient denies any recent travel ROS: Patient currently denies any vision changes, tinnitus, difficulty speaking, facial droop, sore throat, chest pain, shortness of breath, abdominal pain, nausea/vomiting/diarrhea, dysuria, or weakness/numbness/paresthesias in any extremity   Physical Exam  Triage Vital Signs: ED Triage Vitals  Enc Vitals Group     BP 12/11/21 1856 (!) 88/62     Pulse Rate 12/11/21 1856 92     Resp 12/11/21 1856 16     Temp 12/11/21 1856 97.7 F (36.5 C)     Temp Source 12/11/21 1856 Oral     SpO2 12/11/21 1856 100 %     Weight 12/11/21 1858 160 lb (72.6 kg)     Height 12/11/21 1858 5\' 9"  (1.753 m)     Head Circumference --      Peak Flow --      Pain Score --      Pain Loc --      Pain Edu? --      Excl. in [place]? --    Most recent vital signs: Vitals:   12/11/21 2100 12/11/21 2230  BP: 130/87 126/75  Pulse: 76 74  Resp: 14   Temp:    SpO2: 96% 100%   General: Awake, oriented x4. CV:  Good peripheral perfusion.  Resp:  Normal effort.  Clear to auscultation bilaterally Abd:  No distention.  Other:  Disheveled elderly Caucasian male laying in bed in no acute distress ED Results / Procedures / Treatments  Labs (all labs ordered are listed, but only abnormal results are displayed) Labs Reviewed  COMPREHENSIVE METABOLIC PANEL - Abnormal; Notable for the following components:      Result Value   Creatinine, Ser 1.31 (*)    GFR, Estimated 60 (*)    All other  components within normal limits  CBC WITH DIFFERENTIAL/PLATELET - Abnormal; Notable for the following components:   WBC 11.4 (*)    Platelets 137 (*)    Monocytes Absolute 1.1 (*)    Abs Immature Granulocytes 0.08 (*)    All other components within normal limits  URINALYSIS, ROUTINE W REFLEX MICROSCOPIC - Abnormal; Notable for the following components:   Color, Urine YELLOW (*)    APPearance HAZY (*)    Hgb urine dipstick MODERATE (*)    Ketones, ur 5 (*)    Leukocytes,Ua TRACE (*)    Bacteria, UA RARE (*)    All other components within normal limits  RESP PANEL BY RT-PCR (FLU A&B, COVID) ARPGX2  T4, FREE  TROPONIN I (HIGH SENSITIVITY)  TROPONIN I (HIGH SENSITIVITY)   EKG ED ECG REPORT I, 12/13/21, the attending physician, personally viewed and interpreted this ECG. Date: 12/11/2021 EKG Time: 1915 Rate: 88 Rhythm: normal sinus rhythm QRS Axis: normal Intervals: normal ST/T Wave abnormalities: normal Narrative Interpretation: no evidence of acute ischemia RADIOLOGY ED MD interpretation: One-view portable chest x-ray interpreted by me shows no evidence of acute abnormalities including no pneumonia, pneumothorax, or widened mediastinum -Agree with radiology  assessment Official radiology report(s): DG Chest Port 1 View  Result Date: 12/11/2021 CLINICAL DATA:  Shortness of breath and cough. EXAM: PORTABLE CHEST 1 VIEW COMPARISON:  Sep 08, 2020 FINDINGS: The heart size and mediastinal contours are within normal limits. Both lungs are clear. The visualized skeletal structures are unremarkable. IMPRESSION: No active disease. Electronically Signed   By: Aram Candela M.D.   On: 12/11/2021 19:50   PROCEDURES: Critical Care performed: No .1-3 Lead EKG Interpretation  Performed by: Merwyn Katos, MD Authorized by: Merwyn Katos, MD     Interpretation: normal     ECG rate:  75   ECG rate assessment: normal     Rhythm: sinus rhythm     Ectopy: none     Conduction:  normal    MEDICATIONS ORDERED IN ED: Medications - No data to display IMPRESSION / MDM / ASSESSMENT AND PLAN / ED COURSE  I reviewed the triage vital signs and the nursing notes.                             The patient is on the cardiac monitor to evaluate for evidence of arrhythmia and/or significant heart rate changes. Patient's presentation is most consistent with acute presentation with potential threat to life or bodily function. The patient is suffering from shortness of breath, but the immediate cause is not apparent.  Potential causes considered include, but are not limited to, asthma or COPD, congestive heart failure, pulmonary embolism, pneumothorax, coronary syndrome, pneumonia, and pleural effusion.  Despite the evaluation including history, exam, and testing, the cause of the shortness of breath remains unclear. However, during the ED stay, patients condition improved, and at the time of discharge the shortness of breath is resolved, they are feeling well, and want to go home.  Patient will be discharged with strict return precautions and advice to follow up with primary MD within 24 hours for further evaluation.   FINAL CLINICAL IMPRESSION(S) / ED DIAGNOSES   Final diagnoses:  Productive cough  Acute cystitis with hematuria   Rx / DC Orders   ED Discharge Orders          Ordered    cefdinir (OMNICEF) 300 MG capsule  2 times daily        12/11/21 2252           Note:  This document was prepared using Dragon voice recognition software and may include unintentional dictation errors.   Merwyn Katos, MD 12/11/21 [phone]

## 2022-06-04 ENCOUNTER — Other Ambulatory Visit: Payer: Self-pay

## 2022-06-04 ENCOUNTER — Emergency Department
Admission: EM | Admit: 2022-06-04 | Discharge: 2022-06-05 | Disposition: A | Discharge status: Home / Self Care | Payer: Medicare Other | Attending: Emergency Medicine | Admitting: Emergency Medicine

## 2022-06-04 DIAGNOSIS — J449 Chronic obstructive pulmonary disease, unspecified: Secondary | ICD-10-CM | POA: Insufficient documentation

## 2022-06-04 DIAGNOSIS — Z20822 Contact with and (suspected) exposure to covid-19: Secondary | ICD-10-CM | POA: Insufficient documentation

## 2022-06-04 DIAGNOSIS — R1310 Dysphagia, unspecified: Secondary | ICD-10-CM | POA: Diagnosis not present

## 2022-06-04 DIAGNOSIS — R42 Dizziness and giddiness: Secondary | ICD-10-CM

## 2022-06-04 LAB — URINALYSIS, ROUTINE W REFLEX MICROSCOPIC
Bacteria, UA: NONE SEEN
Bilirubin Urine: NEGATIVE
Glucose, UA: NEGATIVE mg/dL
Ketones, ur: NEGATIVE mg/dL
Leukocytes,Ua: NEGATIVE
Nitrite: NEGATIVE
Protein, ur: NEGATIVE mg/dL
Specific Gravity, Urine: 1.016 (ref 1.005–1.030)
pH: 7 (ref 5.0–8.0)

## 2022-06-04 LAB — CBC
HCT: 42.5 % (ref 39.0–52.0)
Hemoglobin: 14.3 g/dL (ref 13.0–17.0)
MCH: 32.5 pg (ref 26.0–34.0)
MCHC: 33.6 g/dL (ref 30.0–36.0)
MCV: 96.6 fL (ref 80.0–100.0)
Platelets: 178 10*3/uL (ref 150–400)
RBC: 4.4 MIL/uL (ref 4.22–5.81)
RDW: 13.8 % (ref 11.5–15.5)
WBC: 8.1 10*3/uL (ref 4.0–10.5)
nRBC: 0 % (ref 0.0–0.2)

## 2022-06-04 LAB — BASIC METABOLIC PANEL
Anion gap: 12 (ref 5–15)
BUN: 24 mg/dL — ABNORMAL HIGH (ref 8–23)
CO2: 22 mmol/L (ref 22–32)
Calcium: 9.3 mg/dL (ref 8.9–10.3)
Chloride: 103 mmol/L (ref 98–111)
Creatinine, Ser: 1.17 mg/dL (ref 0.61–1.24)
GFR, Estimated: >60 mL/min (ref >60)
Glucose, Bld: 112 mg/dL — ABNORMAL HIGH (ref 70–99)
Potassium: 4.5 mmol/L (ref 3.5–5.1)
Sodium: 137 mmol/L (ref 135–145)

## 2022-06-04 LAB — RESP PANEL BY RT-PCR (RSV, FLU A&B, COVID)  RVPGX2
Influenza A by PCR: NEGATIVE
Influenza B by PCR: NEGATIVE
Resp Syncytial Virus by PCR: NEGATIVE
SARS Coronavirus 2 by RT PCR: NEGATIVE

## 2022-06-04 LAB — TROPONIN I (HIGH SENSITIVITY): Troponin I (High Sensitivity): 6 ng/L (ref <18)

## 2022-06-04 MED ORDER — DIPHENHYDRAMINE HCL 50 MG/ML IJ SOLN
25.0000 mg | Freq: Once | INTRAMUSCULAR | Status: AC
  Administered 2022-06-04: 25 mg via INTRAVENOUS
  Filled 2022-06-04: qty 1

## 2022-06-04 MED ORDER — SODIUM CHLORIDE 0.9 % IV BOLUS
1000.0000 mL | Freq: Once | INTRAVENOUS | Status: AC
  Administered 2022-06-04: 1000 mL via INTRAVENOUS

## 2022-06-04 NOTE — ED Triage Notes (Addendum)
Pt to ED via [facility]. Pt reports trouble swallowing his food x4 months. Pt reports last night he started feeling dizzy and was still feeling dizzy during morning med pass. Pt with hx tardive dyskinesia.   146/73 78 HR 99% RA

## 2022-06-04 NOTE — Discharge Instructions (Signed)
As we discussed please drink plenty of fluids over the next several days.  Please follow-up with your doctor on Monday or Tuesday for recheck/reevaluation.  Return to the emergency department for any symptoms personally concerning to yourself.

## 2022-06-04 NOTE — ED Notes (Addendum)
5 more attempts made at contacting patient group home without success.

## 2022-06-04 NOTE — ED Notes (Signed)
Put on the bedpan. + stool, minimal urine. Urine sent

## 2022-06-04 NOTE — ED Provider Notes (Signed)
[facility] Provider Note    Event Date/Time   First MD Initiated Contact with Patient 06/04/22 1625     (approximate)  History   Chief Complaint: Dysphagia and Dizziness  HPI  Austin Marsh is a 67 y.o. male with a past medical history of COPD, schizoaffective disorder/bipolar, tardive dyskinesia, presents to the emergency department for dizziness.  According to the patient since yesterday he has had some dizziness and lightheadedness since yesterday.  Patient has a history of tardive dyskinesia states he has trouble swallowing at times when he eats things like peanut butter sandwiches, but states he has been able to drink milk and coffee recently without issue.  States he can swallow without issue as long as he is drinking a drink at the same time.  Patient describes the dizziness sensation is more of a lightheaded sensation over the past 2 days.  Denies any fever cough no chest pain.  Physical Exam   Triage Vital Signs: ED Triage Vitals  Enc Vitals Group     BP 06/04/22 1547 132/85     Pulse Rate 06/04/22 1547 89     Resp 06/04/22 1547 18     Temp 06/04/22 1547 98.6 F (37 C)     Temp Source 06/04/22 1547 Oral     SpO2 06/04/22 1547 97 %     Weight --      Height --      Head Circumference --      Peak Flow --      Pain Score 06/04/22 1546 0     Pain Loc --      Pain Edu? --      Excl. in [place]? --     Most recent vital signs: Vitals:   06/04/22 1547  BP: 132/85  Pulse: 89  Resp: 18  Temp: 98.6 F (37 C)  SpO2: 97%    General: Awake, no distress.  Has tardive dyskinesia with frequent repetitive facial movements. CV:  Good peripheral perfusion.  Regular rate and rhythm  Resp:  Normal effort.  Equal breath sounds bilaterally.  Abd:  No distention.  Soft, nontender.  No rebound or guarding.   ED Results / Procedures / Treatments   EKG  EKG viewed and interpreted by myself shows normal sinus rhythm 85 bpm with a narrow QRS, normal axis,  normal intervals, no concerning ST changes.  MEDICATIONS ORDERED IN ED: Medications  sodium chloride 0.9 % bolus 1,000 mL (has no administration in time range)  diphenhydrAMINE (BENADRYL) injection 25 mg (has no administration in time range)     IMPRESSION / MDM / ASSESSMENT AND PLAN / ED COURSE  I reviewed the triage vital signs and the nursing notes.  Patient's presentation is most consistent with acute presentation with potential threat to life or bodily function.  Patient presents emergency department with lightheadedness/dizziness intermittently over the past 2 days.  Overall the patient appears well, no concerning findings on physical exam or vitals.  Patient has a history of tardive dyskinesia.  Patient CBC is normal, chemistry is normal with anion gap of 12.  Given the patient's lightheadedness/dizziness we will give a liter of IV fluids as well as a small dose of Benadryl.  We will add on a cardiac enzyme as a precaution given his lightheadedness as well as a COVID/flu/RSV test.  I would like to check a urine sample as well if possible although the patient denies any urinary symptoms.  Patient's COVID/flu/RSV is negative, chemistry is  reassuring, CBC is reassuring, troponin is negative.  Patient states he is feeling somewhat better after fluids.  Patient states he believes he can give [place] a urine sample we will check a urinalysis.  As long as there is no infection on the urine I believe the patient could be discharged home, if there is an infection patient will need antibiotics but still I believe to go home and follow-up with his primary care doctor.  Patient is agreeable to this plan.  FINAL CLINICAL IMPRESSION(S) / ED DIAGNOSES   Lightheadedness   Note:  This document was prepared using Dragon voice recognition software and may include unintentional dictation errors.   Harvest Dark, MD 06/04/22 [place]

## 2022-06-04 NOTE — ED Notes (Addendum)
RN called pt home 5 times at multiple listed numbers with no answer in effort to notify group home of pt discharge and return to home. RN unable to leave message as all mailboxes full. RN unable to verify that someone can meet pt at group home at this time. Will make further attempts to contact.

## 2022-06-04 NOTE — ED Notes (Signed)
Pt used bedpan. +stool. - urine.

## 2022-06-05 NOTE — ED Notes (Signed)
Clement from pt care home called RN back. States he cannot come pick up patient but will be present at house to assist EMS with getting him inside home.

## 2022-06-23 ENCOUNTER — Telehealth (HOSPITAL_COMMUNITY): Payer: Self-pay | Admitting: *Deleted

## 2022-06-23 NOTE — Telephone Encounter (Signed)
[employer]  SENT A FAXED A LETTER THAT STATES THE FOLLOWING MEDICATION UNDER THE [employer] CLASSIC (PDP) MEDICARE PRESCRIPTION DRUG PLAN ---  THIS LETTER IS INFORM YOU THAT WE HAVE PROVIDED YOUR PATIENT WITH A TEMPORARY SUPPLY OF THIS MEDICATION  INGREZZA 40 MG CAPSULE   THIS DRUG IS NOT ON OUR FORMULARY & WE HAVE ALSO PROVIDED YOUR PATIENT WITH THE REASON FOR NOTIFICATION       ALTERNATIVE MEDICATION AUSTEDO  && TETRABEN  PAYMENT FOR THIS MEDICATION WILL BE DISCONTINUED AFTER THE MAXIMUM 30 DAY TEMPORARY SUPPLY

## 2022-06-23 NOTE — Telephone Encounter (Signed)
[employer]  SENT A FAXED A LETTER THAT STATES THE FOLLOWING MEDICATION UNDER THE [employer] CLASSIC (PDP) MEDICARE PRESCRIPTION DRUG PLAN ---   THIS LETTER IS INFORM YOU THAT WE HAVE PROVIDED YOUR PATIENT WITH A TEMPORARY SUPPLY OF THIS MEDICATION  INGREZZA 40 MG CAPSULE    THIS DRUG IS NOT ON OUR FORMULARY & WE HAVE ALSO PROVIDED YOUR PATIENT WITH THE REASON FOR NOTIFICATION        ALTERNATIVE MEDICATION AUSTEDO  && TETRABEN   PAYMENT FOR THIS MEDICATION WILL BE DISCONTINUED AFTER THE MAXIMUM 30 DAY TEMPORARY SUPPLY

## 2022-07-13 ENCOUNTER — Telehealth (HOSPITAL_COMMUNITY): Payer: Self-pay | Admitting: *Deleted

## 2022-07-13 NOTE — Telephone Encounter (Signed)
Opened in [place]

## 2022-10-03 IMAGING — CT CT HEAD W/O CM
3 series · 16 of 47 positions shown, 19 images · non-contrast
Comparison: CT brain 06/21/2018

CLINICAL DATA: Head trauma fall

EXAM:
CT HEAD WITHOUT CONTRAST
TECHNIQUE: Contiguous axial images were obtained from the base of the skull
through the vertex without intravenous contrast.

[Series 2: head wo · axial · 0.42mm/px · z∈[-118,+7]mm · 10 of 30 slices shown, 13 images]
[im 3/30  brain]
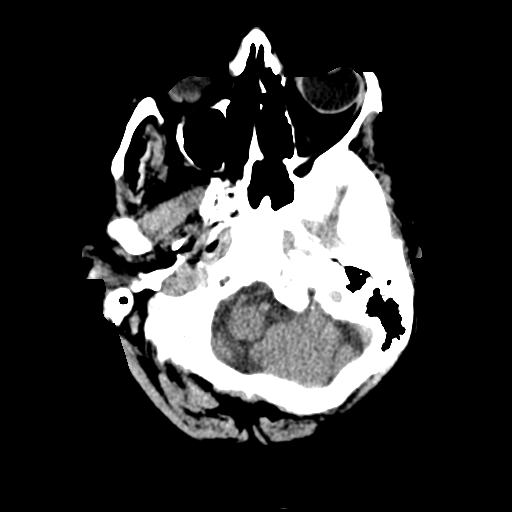
[im 3/30  bone]
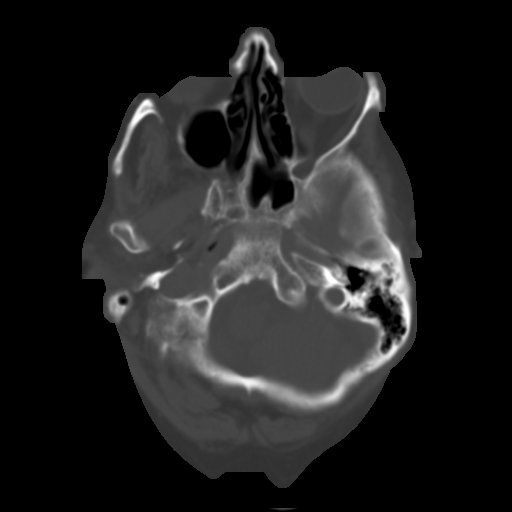
[im 6/30  brain]
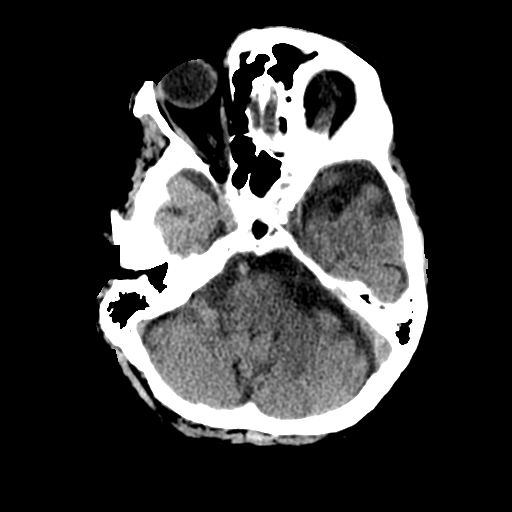
[im 9/30  brain]
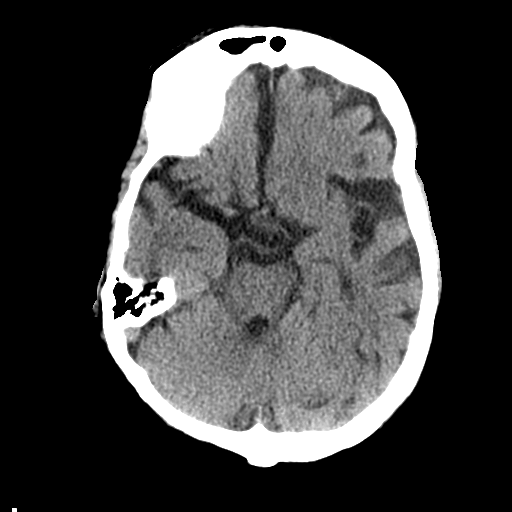
[im 11/30  brain]
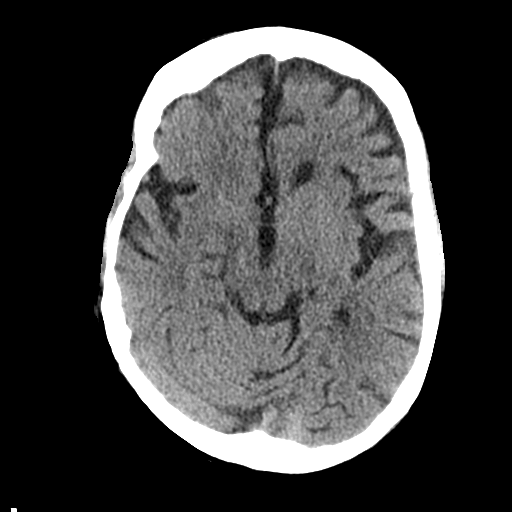
[im 14/30  brain]
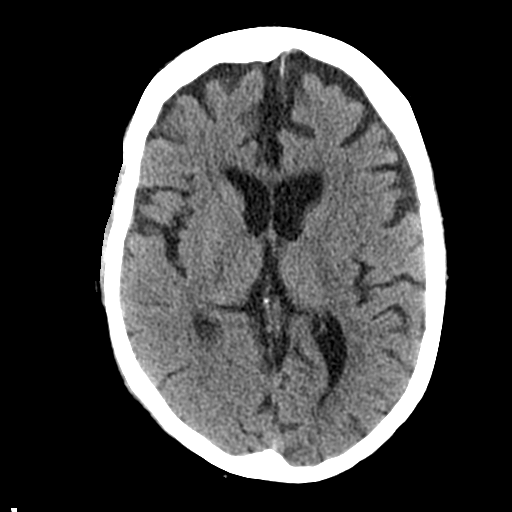
[im 14/30  bone]
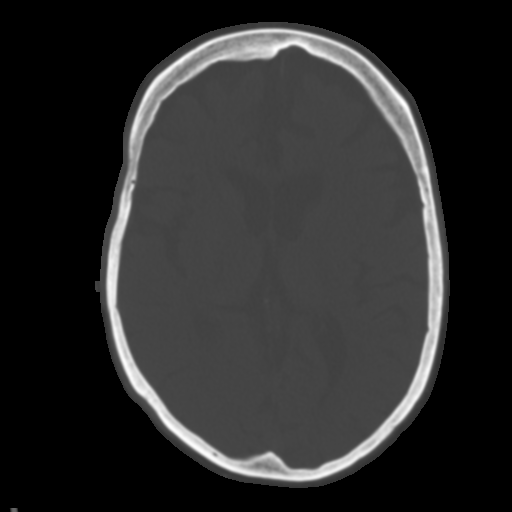
[im 17/30  brain]
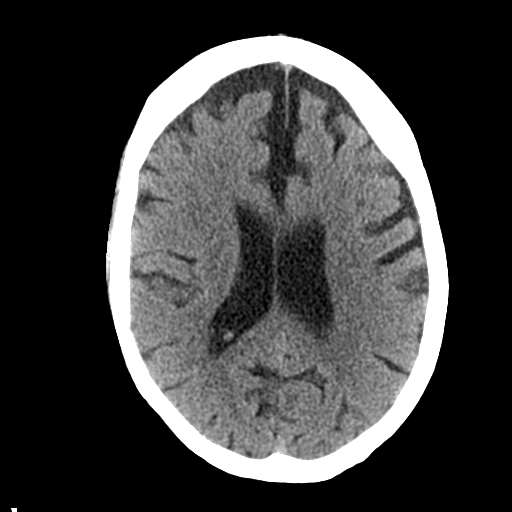
[im 20/30  brain]
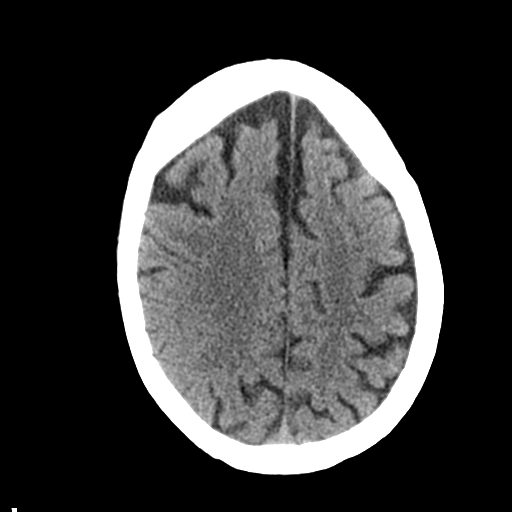
[im 23/30  brain]
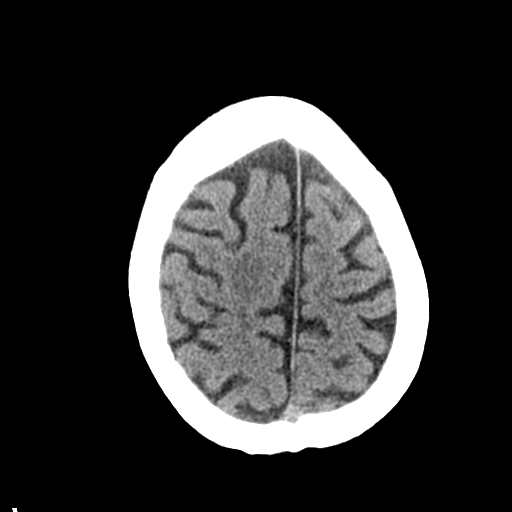
[im 25/30  brain]
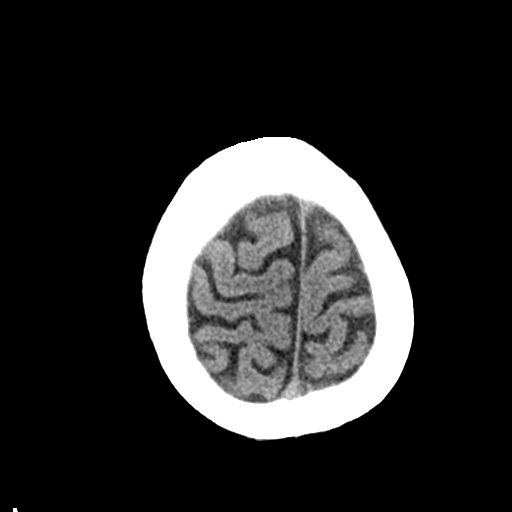
[im 25/30  bone]
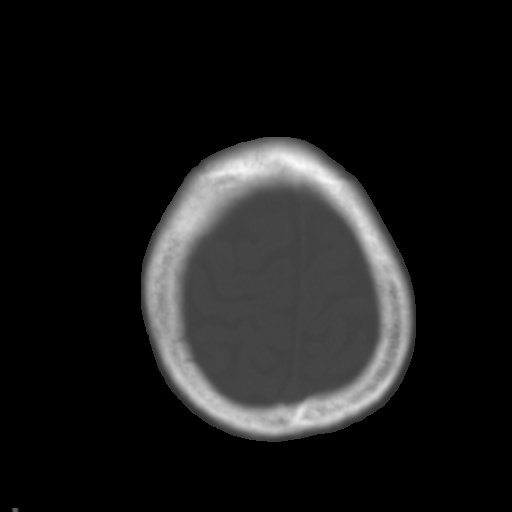
[im 28/30  brain]
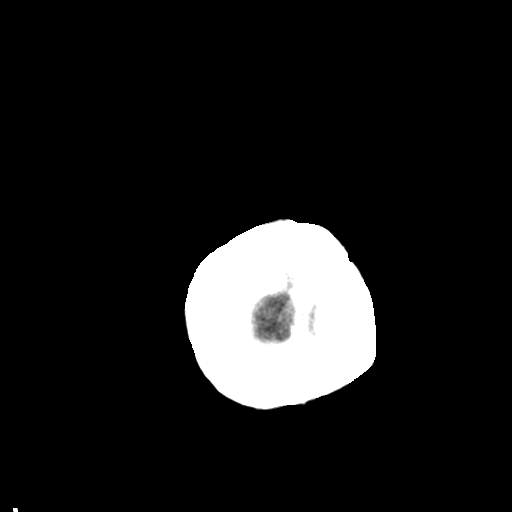

[Series 4: coronal soft tissue · coronal · 0.31mm/px · 3 of 68 slices shown]
[im 23/68  brain]
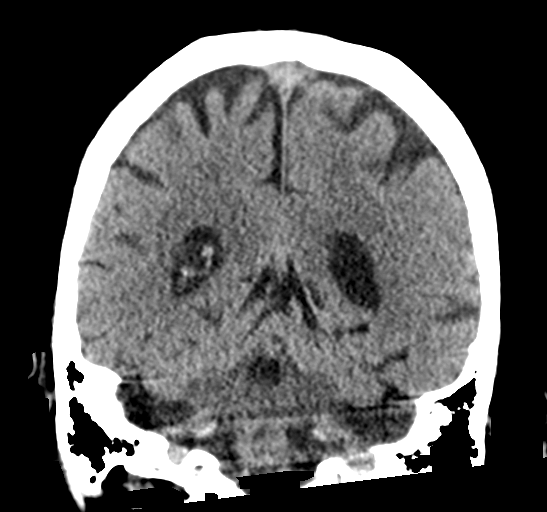
[im 30/68  brain]
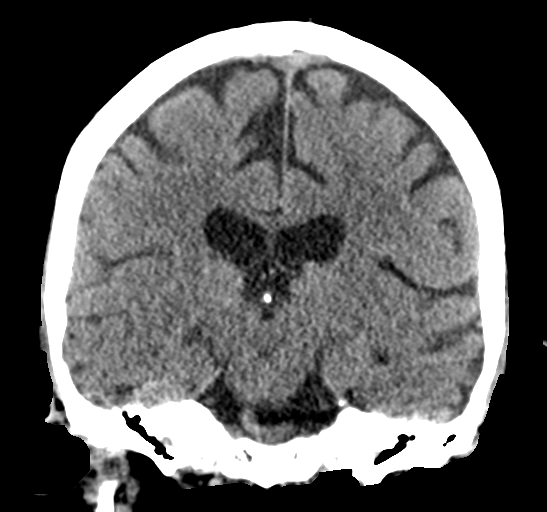
[im 38/68  brain]
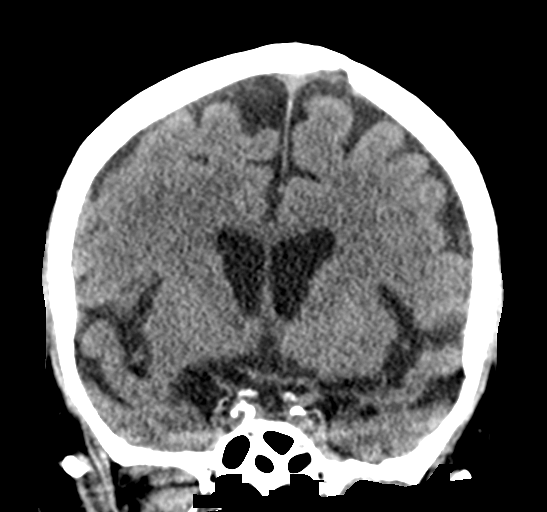

[Series 5: sagittal soft tissue · sagittal · 0.31mm/px · 3 of 57 slices shown]
[im 20/57  brain]
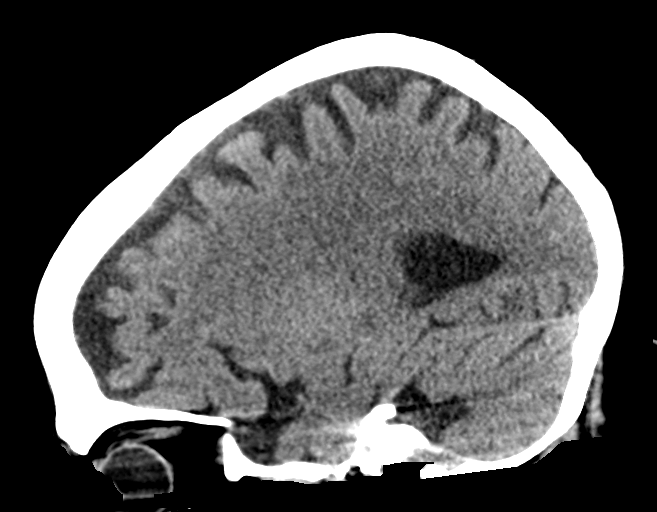
[im 29/57  brain]
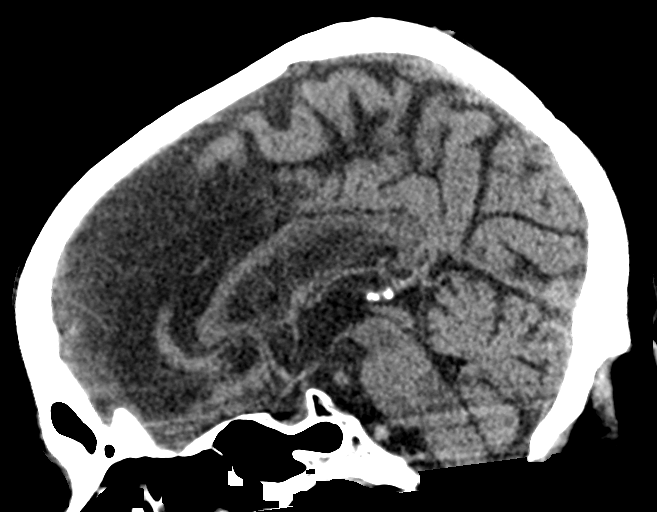
[im 37/57  brain]
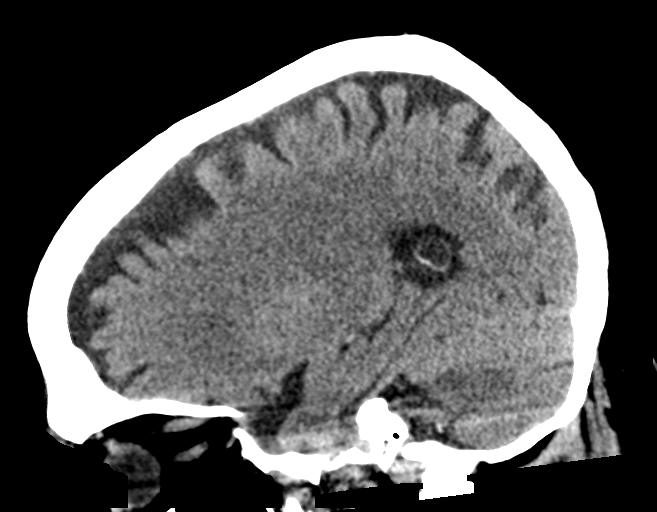

[16 of 47 positions shown; findings below may reference images not displayed]

FINDINGS: Brain: No acute territorial infarction, hemorrhage, or intracranial
mass. Moderate-to-marked atrophy. Mild hypodensity in the white
matter likely chronic small vessel ischemic change. Stable ventricle
size

Vascular: No hyperdense vessels.  Carotid vascular calcification

Skull: Normal. Negative for fracture or focal lesion.

Sinuses/Orbits: No acute finding.

Other: None
IMPRESSION: 1. No CT evidence for acute intracranial abnormality.
2. Atrophy and mild chronic small vessel ischemic changes of the
white matter.

## 2022-10-16 ENCOUNTER — Inpatient Hospital Stay
Admission: EM | Admit: 2022-10-16 | Discharge: 2022-11-04 | DRG: 871 | Disposition: A | Discharge status: Skilled Nursing Facility | Payer: Medicare Other | Attending: Hospitalist | Admitting: Hospitalist

## 2022-10-16 ENCOUNTER — Other Ambulatory Visit: Payer: Self-pay

## 2022-10-16 ENCOUNTER — Emergency Department: Payer: Medicare Other

## 2022-10-16 ENCOUNTER — Encounter: Payer: Self-pay | Admitting: Emergency Medicine

## 2022-10-16 DIAGNOSIS — R571 Hypovolemic shock: Secondary | ICD-10-CM | POA: Diagnosis present

## 2022-10-16 DIAGNOSIS — N186 End stage renal disease: Secondary | ICD-10-CM | POA: Diagnosis present

## 2022-10-16 DIAGNOSIS — E8770 Fluid overload, unspecified: Secondary | ICD-10-CM | POA: Diagnosis not present

## 2022-10-16 DIAGNOSIS — Z888 Allergy status to other drugs, medicaments and biological substances status: Secondary | ICD-10-CM

## 2022-10-16 DIAGNOSIS — R7989 Other specified abnormal findings of blood chemistry: Secondary | ICD-10-CM

## 2022-10-16 DIAGNOSIS — G2401 Drug induced subacute dyskinesia: Secondary | ICD-10-CM | POA: Diagnosis present

## 2022-10-16 DIAGNOSIS — J9601 Acute respiratory failure with hypoxia: Secondary | ICD-10-CM | POA: Diagnosis present

## 2022-10-16 DIAGNOSIS — D3501 Benign neoplasm of right adrenal gland: Secondary | ICD-10-CM | POA: Diagnosis present

## 2022-10-16 DIAGNOSIS — E860 Dehydration: Secondary | ICD-10-CM | POA: Diagnosis present

## 2022-10-16 DIAGNOSIS — K5900 Constipation, unspecified: Secondary | ICD-10-CM | POA: Diagnosis present

## 2022-10-16 DIAGNOSIS — F250 Schizoaffective disorder, bipolar type: Secondary | ICD-10-CM | POA: Diagnosis present

## 2022-10-16 DIAGNOSIS — M5136 Other intervertebral disc degeneration, lumbar region: Secondary | ICD-10-CM | POA: Diagnosis present

## 2022-10-16 DIAGNOSIS — Z79899 Other long term (current) drug therapy: Secondary | ICD-10-CM

## 2022-10-16 DIAGNOSIS — S1093XA Contusion of unspecified part of neck, initial encounter: Secondary | ICD-10-CM | POA: Diagnosis not present

## 2022-10-16 DIAGNOSIS — J690 Pneumonitis due to inhalation of food and vomit: Secondary | ICD-10-CM | POA: Diagnosis not present

## 2022-10-16 DIAGNOSIS — B9562 Methicillin resistant Staphylococcus aureus infection as the cause of diseases classified elsewhere: Secondary | ICD-10-CM | POA: Diagnosis present

## 2022-10-16 DIAGNOSIS — D631 Anemia in chronic kidney disease: Secondary | ICD-10-CM | POA: Diagnosis present

## 2022-10-16 DIAGNOSIS — Z1152 Encounter for screening for COVID-19: Secondary | ICD-10-CM

## 2022-10-16 DIAGNOSIS — D696 Thrombocytopenia, unspecified: Secondary | ICD-10-CM | POA: Diagnosis not present

## 2022-10-16 DIAGNOSIS — E278 Other specified disorders of adrenal gland: Secondary | ICD-10-CM | POA: Diagnosis present

## 2022-10-16 DIAGNOSIS — E872 Acidosis, unspecified: Secondary | ICD-10-CM | POA: Diagnosis present

## 2022-10-16 DIAGNOSIS — R197 Diarrhea, unspecified: Secondary | ICD-10-CM | POA: Diagnosis not present

## 2022-10-16 DIAGNOSIS — Z79890 Hormone replacement therapy (postmenopausal): Secondary | ICD-10-CM

## 2022-10-16 DIAGNOSIS — W1830XA Fall on same level, unspecified, initial encounter: Secondary | ICD-10-CM | POA: Diagnosis present

## 2022-10-16 DIAGNOSIS — E039 Hypothyroidism, unspecified: Secondary | ICD-10-CM | POA: Diagnosis present

## 2022-10-16 DIAGNOSIS — R7881 Bacteremia: Secondary | ICD-10-CM | POA: Diagnosis present

## 2022-10-16 DIAGNOSIS — I2510 Atherosclerotic heart disease of native coronary artery without angina pectoris: Secondary | ICD-10-CM | POA: Diagnosis present

## 2022-10-16 DIAGNOSIS — R6521 Severe sepsis with septic shock: Secondary | ICD-10-CM | POA: Diagnosis present

## 2022-10-16 DIAGNOSIS — D62 Acute posthemorrhagic anemia: Secondary | ICD-10-CM | POA: Diagnosis not present

## 2022-10-16 DIAGNOSIS — I2489 Other forms of acute ischemic heart disease: Secondary | ICD-10-CM | POA: Diagnosis present

## 2022-10-16 DIAGNOSIS — T148XXA Other injury of unspecified body region, initial encounter: Secondary | ICD-10-CM | POA: Diagnosis not present

## 2022-10-16 DIAGNOSIS — A419 Sepsis, unspecified organism: Secondary | ICD-10-CM | POA: Diagnosis not present

## 2022-10-16 DIAGNOSIS — F419 Anxiety disorder, unspecified: Secondary | ICD-10-CM | POA: Diagnosis present

## 2022-10-16 DIAGNOSIS — B958 Unspecified staphylococcus as the cause of diseases classified elsewhere: Secondary | ICD-10-CM | POA: Diagnosis present

## 2022-10-16 DIAGNOSIS — N1831 Acute kidney failure, unspecified: Secondary | ICD-10-CM | POA: Diagnosis present

## 2022-10-16 DIAGNOSIS — R4182 Altered mental status, unspecified: Secondary | ICD-10-CM | POA: Diagnosis not present

## 2022-10-16 DIAGNOSIS — J9811 Atelectasis: Secondary | ICD-10-CM | POA: Diagnosis present

## 2022-10-16 DIAGNOSIS — M6282 Rhabdomyolysis: Secondary | ICD-10-CM | POA: Diagnosis present

## 2022-10-16 DIAGNOSIS — B957 Other staphylococcus as the cause of diseases classified elsewhere: Secondary | ICD-10-CM | POA: Diagnosis present

## 2022-10-16 DIAGNOSIS — N179 Acute kidney failure, unspecified: Secondary | ICD-10-CM | POA: Diagnosis present

## 2022-10-16 DIAGNOSIS — W19XXXA Unspecified fall, initial encounter: Secondary | ICD-10-CM | POA: Diagnosis present

## 2022-10-16 DIAGNOSIS — S0003XA Contusion of scalp, initial encounter: Secondary | ICD-10-CM | POA: Diagnosis not present

## 2022-10-16 DIAGNOSIS — A411 Sepsis due to other specified staphylococcus: Principal | ICD-10-CM | POA: Diagnosis present

## 2022-10-16 DIAGNOSIS — L8990 Pressure ulcer of unspecified site, unspecified stage: Secondary | ICD-10-CM | POA: Insufficient documentation

## 2022-10-16 DIAGNOSIS — G4733 Obstructive sleep apnea (adult) (pediatric): Secondary | ICD-10-CM | POA: Diagnosis present

## 2022-10-16 DIAGNOSIS — G9341 Metabolic encephalopathy: Secondary | ICD-10-CM | POA: Diagnosis present

## 2022-10-16 DIAGNOSIS — N320 Bladder-neck obstruction: Secondary | ICD-10-CM | POA: Diagnosis present

## 2022-10-16 DIAGNOSIS — N400 Enlarged prostate without lower urinary tract symptoms: Secondary | ICD-10-CM | POA: Diagnosis present

## 2022-10-16 DIAGNOSIS — Z720 Tobacco use: Secondary | ICD-10-CM | POA: Diagnosis present

## 2022-10-16 DIAGNOSIS — K760 Fatty (change of) liver, not elsewhere classified: Secondary | ICD-10-CM | POA: Diagnosis present

## 2022-10-16 DIAGNOSIS — R739 Hyperglycemia, unspecified: Secondary | ICD-10-CM | POA: Diagnosis present

## 2022-10-16 DIAGNOSIS — I700 Atherosclerosis of aorta: Secondary | ICD-10-CM | POA: Diagnosis present

## 2022-10-16 DIAGNOSIS — J90 Pleural effusion, not elsewhere classified: Secondary | ICD-10-CM | POA: Diagnosis not present

## 2022-10-16 DIAGNOSIS — M5134 Other intervertebral disc degeneration, thoracic region: Secondary | ICD-10-CM | POA: Diagnosis present

## 2022-10-16 DIAGNOSIS — N401 Enlarged prostate with lower urinary tract symptoms: Secondary | ICD-10-CM | POA: Diagnosis present

## 2022-10-16 DIAGNOSIS — R6510 Systemic inflammatory response syndrome (SIRS) of non-infectious origin without acute organ dysfunction: Secondary | ICD-10-CM | POA: Diagnosis present

## 2022-10-16 DIAGNOSIS — J449 Chronic obstructive pulmonary disease, unspecified: Secondary | ICD-10-CM | POA: Diagnosis present

## 2022-10-16 DIAGNOSIS — R338 Other retention of urine: Secondary | ICD-10-CM | POA: Diagnosis present

## 2022-10-16 DIAGNOSIS — Z992 Dependence on renal dialysis: Secondary | ICD-10-CM

## 2022-10-16 DIAGNOSIS — I214 Non-ST elevation (NSTEMI) myocardial infarction: Secondary | ICD-10-CM | POA: Diagnosis present

## 2022-10-16 DIAGNOSIS — F418 Other specified anxiety disorders: Secondary | ICD-10-CM | POA: Diagnosis present

## 2022-10-16 DIAGNOSIS — E871 Hypo-osmolality and hyponatremia: Secondary | ICD-10-CM | POA: Diagnosis present

## 2022-10-16 DIAGNOSIS — F17210 Nicotine dependence, cigarettes, uncomplicated: Secondary | ICD-10-CM | POA: Diagnosis present

## 2022-10-16 MED ORDER — VANCOMYCIN HCL IN DEXTROSE 1-5 GM/200ML-% IV SOLN: 1000.0000 mg | Freq: Once | INTRAVENOUS | Status: DC

## 2022-10-16 MED ORDER — LACTATED RINGERS IV BOLUS (SEPSIS)
2000.0000 mL | Freq: Once | INTRAVENOUS | Status: AC
  Administered 2022-10-16: 2000 mL via INTRAVENOUS

## 2022-10-16 MED ORDER — SODIUM CHLORIDE 0.9 % IV SOLN
2.0000 g | Freq: Once | INTRAVENOUS | Status: AC
  Administered 2022-10-16: 2 g via INTRAVENOUS
  Filled 2022-10-16: qty 12.5

## 2022-10-16 MED ORDER — VANCOMYCIN HCL 2000 MG/400ML IV SOLN
2000.0000 mg | Freq: Once | INTRAVENOUS | Status: AC
  Administered 2022-10-17: 2000 mg via INTRAVENOUS
  Filled 2022-10-16: qty 400

## 2022-10-16 NOTE — ED Triage Notes (Addendum)
T 100 axillary 90/p 18g l ac 20 r hand  Patient BIB [facility] from facility? EMS reports they were called out for a fall, they found patient on the floor, and apparently that was all the information the facility could give EMS.  EMS reports patient endorses lower back pain.  EMS reports patient meets sepsis criteria d/t ETCO2, fever, AMS, and hypotension.

## 2022-10-16 NOTE — Sepsis Progress Note (Signed)
Elink following code sepsis °

## 2022-10-16 NOTE — Progress Notes (Signed)
CODE SEPSIS - PHARMACY COMMUNICATION  **Broad Spectrum Antibiotics should be administered within 1 hour of Sepsis diagnosis**  Time Code Sepsis Called/Page Received: 2336  Antibiotics Ordered: Cefepime & Vancomycin  Time of 1st antibiotic administration: 2345  Otelia Sergeant, PharmD, [facility] 10/16/2022 11:38 PM

## 2022-10-16 NOTE — Progress Notes (Signed)
PHARMACY -  BRIEF ANTIBIOTIC NOTE   Pharmacy has received consult(s) for Vancomycin from an ED provider.  The patient's profile has been reviewed for ht/wt/allergies/indication/available labs.    One time order(s) placed for Vancomycin 2 gm per pt wt: 90.7 kg  Further antibiotics/pharmacy consults should be ordered by admitting physician if indicated.                       Thank you, Otelia Sergeant, PharmD, [facility] 10/16/2022 11:37 PM

## 2022-10-16 NOTE — ED Provider Notes (Signed)
[facility] Provider Note    Event Date/Time   First MD Initiated Contact with Patient 10/16/22 2331     (approximate)   History   Code Sepsis  Level 5 caveat:  history/ROS limited by acute/critical illness  HPI Austin Marsh is a 67 y.o. male whose medical history seems to include schizoaffective disorder and COPD.  He was brought in by EMS from a care facility although they are uncertain of what type.  The call out was for a possible fall and they found the patient on the floor but the facility was unable to give any information about the patient and his history.  EMS took some paperwork with them that includes the patient's medications which notably include clozapine.    The patient was hypotensive, tachycardic, and febrile for EMS so they activated a "critical patient".  The patient was talking by the time he arrived although he seems somewhat altered but his baseline is unknown.  He reports some pain in his low back in the middle of his back but he said that his back often hurts.  He denies shortness of breath and chest pain.  He is unable to provide any additional history.  He has no obvious signs of trauma.     Physical Exam   ED Triage Vitals  Enc Vitals Group     BP 10/16/22 2332 (!) 150/117     Pulse Rate 10/16/22 2332 (!) 123     Resp 10/16/22 2334 (!) 22     Temp 10/16/22 2336 (!) 101 F (38.3 C)     Temp Source 10/16/22 2336 Rectal     SpO2 10/16/22 2332 98 %     Weight 10/16/22 2331 90.7 kg (200 lb)     Height 10/16/22 2331 1.753 m (5\' 9" )     Head Circumference --      Peak Flow --      Pain Score 10/16/22 2331 10     Pain Loc --      Pain Edu? --      Excl. in GC? --       Most recent vital signs: Vitals:   10/17/22 0200 10/17/22 0215  BP: 107/65   Pulse: 65 64  Resp: 14 10  Temp:    SpO2: 92% 92%    General: Awake, alert but somewhat confused, unclear baseline.  Appears chronically ill but not in severe distress at the  moment. CV:  Good peripheral perfusion.  Tachycardia, regular rhythm.  Normal heart sounds. Resp:  Normal effort. Speaking easily and comfortably, no accessory muscle usage nor intercostal retractions.  Lungs clear to auscultation bilaterally with no wheezing and no coarse breath sounds. Abd/GU:  No distention.  No tenderness to palpation throughout the abdomen.  Normal-appearing genitalia with no scrotal or perineal crepitus, swelling, or discoloration.  No penile lesions. Other:  No obvious sign of trauma.  No obvious cellulitis on exposed skin surfaces.  No meningismus; patient is flexing, extending, and rotating his head side-to-side with no restriction and no pain or difficulty.   ED Results / Procedures / Treatments   Labs (all labs ordered are listed, but only abnormal results are displayed) Labs Reviewed  LACTIC ACID, PLASMA - Abnormal; Notable for the following components:      Result Value   Lactic Acid, Venous >9.0 (*)    All other components within normal limits  LACTIC ACID, PLASMA - Abnormal; Notable for the following components:   Lactic Acid, Venous  4.9 (*)    All other components within normal limits  COMPREHENSIVE METABOLIC PANEL - Abnormal; Notable for the following components:   Sodium 123 (*)    Chloride 92 (*)    CO2 11 (*)    Glucose, Bld 159 (*)    Creatinine, Ser 1.78 (*)    Calcium 8.1 (*)    Total Protein 6.0 (*)    Albumin 3.3 (*)    GFR, Estimated 41 (*)    Anion gap 20 (*)    All other components within normal limits  CBC WITH DIFFERENTIAL/PLATELET - Abnormal; Notable for the following components:   WBC 11.2 (*)    RBC 3.55 (*)    Hemoglobin 11.5 (*)    HCT 34.3 (*)    Monocytes Absolute 1.3 (*)    Abs Immature Granulocytes 0.21 (*)    All other components within normal limits  PROTIME-INR - Abnormal; Notable for the following components:   Prothrombin Time 15.7 (*)    All other components within normal limits  BLOOD GAS, VENOUS - Abnormal;  Notable for the following components:   pCO2, Ven 26 (*)    pO2, Ven 90 (*)    Bicarbonate 12.8 (*)    Acid-base deficit 11.9 (*)    All other components within normal limits  URINALYSIS, W/ REFLEX TO CULTURE (INFECTION SUSPECTED) - Abnormal; Notable for the following components:   Color, Urine YELLOW (*)    APPearance CLEAR (*)    All other components within normal limits  TROPONIN I (HIGH SENSITIVITY) - Abnormal; Notable for the following components:   Troponin I (High Sensitivity) 172 (*)    All other components within normal limits  TROPONIN I (HIGH SENSITIVITY) - Abnormal; Notable for the following components:   Troponin I (High Sensitivity) 406 (*)    All other components within normal limits  SARS CORONAVIRUS 2 BY RT PCR  CULTURE, BLOOD (ROUTINE X 2)  CULTURE, BLOOD (ROUTINE X 2)  LIPASE, BLOOD     EKG  ED ECG REPORT I, Loleta Rose, the attending physician, personally viewed and interpreted this ECG.  Date: 10/16/2022 EKG Time: 23: 54 Rate: 119 Rhythm: Sinus tachycardia QRS Axis: normal Intervals: normal ST/T Wave abnormalities: Non-specific ST segment / T-wave changes, but no clear evidence of acute ischemia. Narrative Interpretation: no definitive evidence of acute ischemia; does not meet STEMI criteria.    RADIOLOGY I viewed and interpreted the patient's chest x-ray.  No evidence of pneumonia.  Radiology report agrees.   PROCEDURES:  Critical Care performed: Yes, see critical care procedure note(s)  .1-3 Lead EKG Interpretation  Performed by: Loleta Rose, MD Authorized by: Loleta Rose, MD     Interpretation: abnormal     ECG rate:  120   ECG rate assessment: tachycardic     Rhythm: sinus tachycardia     Ectopy: none     Conduction: normal   .1-3 Lead EKG Interpretation  Performed by: Loleta Rose, MD Authorized by: Loleta Rose, MD     Interpretation: normal     ECG rate:  82   ECG rate assessment: normal     Rhythm: sinus rhythm      Ectopy: none     Conduction: normal   .Critical Care  Performed by: Loleta Rose, MD Authorized by: Loleta Rose, MD   Critical care provider statement:    Critical care time (minutes):  75   Critical care time was exclusive of:  Separately billable procedures and treating other patients  Critical care was necessary to treat or prevent imminent or life-threatening deterioration of the following conditions:  Sepsis   Critical care was time spent personally by me on the following activities:  Development of treatment plan with patient or surrogate, evaluation of patient's response to treatment, examination of patient, obtaining history from patient or surrogate, ordering and performing treatments and interventions, ordering and review of laboratory studies, ordering and review of radiographic studies, pulse oximetry, re-evaluation of patient's condition and review of old charts     IMPRESSION / MDM / ASSESSMENT AND PLAN / ED COURSE  I reviewed the triage vital signs and the nursing notes.                              Differential diagnosis includes, but is not limited to, sepsis, electrolyte or metabolic abnormality, renal failure, UTI, pneumonia, ACS, viral illness such as COVID.  Patient's presentation is most consistent with acute presentation with potential threat to life or bodily function.  Labs/studies ordered: I ordered standard sepsis labs including the following: COVID-19 PCR swab, blood cultures x2, pro time-INR, CMP, urinalysis, urine culture, lactic acid, CBC with differential, high-sensitivity troponin, lipase.  Also ordered VBG, chest x-ray, EKG.  Interventions/Medications given:  Medications  lactated ringers bolus 2,000 mL (0 mLs Intravenous Stopped 10/17/22 0055)  ceFEPIme (MAXIPIME) 2 g in sodium chloride 0.9 % 100 mL IVPB (0 g Intravenous Stopped 10/17/22 0024)  vancomycin (VANCOREADY) IVPB 2000 mg/400 mL (0 mg Intravenous Stopped 10/17/22 0319)  morphine (PF) 4  MG/ML injection 4 mg (4 mg Intravenous Given 10/17/22 0021)  ondansetron (ZOFRAN) injection 4 mg (4 mg Intravenous Given 10/17/22 0021)  acetaminophen (TYLENOL) tablet 1,000 mg (1,000 mg Oral Given 10/17/22 0020)  lactated ringers bolus 1,000 mL (0 mLs Intravenous Stopped 10/17/22 0156)  metroNIDAZOLE (FLAGYL) IVPB 500 mg (0 mg Intravenous Stopped 10/17/22 0319)  morphine (PF) 4 MG/ML injection 4 mg (4 mg Intravenous Given 10/17/22 0114)  iohexol (OMNIPAQUE) 300 MG/ML solution 100 mL (80 mLs Intravenous Contrast Given 10/17/22 0229)  aspirin chewable tablet 324 mg (324 mg Oral Given 10/17/22 0203)    (Note:  hospital course my include additional interventions and/or labs/studies not listed above.)   Probable sepsis, unclear source.  No chest pain or shortness of breath of the patient said "sometimes" he is short of breath and he has a history of COPD but his lungs are clear.  Anticipate admission.  The patient is on the cardiac monitor to evaluate for evidence of arrhythmia and/or significant heart rate changes.   Clinical Course as of 10/17/22 0329  Sat Oct 16, 2022  2334 Code sepsis activated with empiric antibiotics (cefepime 2 g IV and vancomycin per pharmacy consult). [CF]  2349 Temp(!): 101 F (38.3 C) [CF]  Sun Oct 17, 2022  0044 Patient's lactic acid is greater than 9.  This is extraordinarily high and somewhat unexpected.  The patient has had no evidence of hypotension in the emergency department.  He received approximately 400 mL of fluid prior to arrival.  I ordered 1/3 L of LR to meet the 30 mL/kg IV fluid goal.  Labs are otherwise notable for a high-sensitivity troponin of 172 which likely represents demand ischemia and a troponin leak rather than an NSTEMI.  EKG is generally reassuring.  Metabolic panel notable for hyponatremia, CO2 of 11 likely from the lactic acidosis, and a creatinine of 1.78 which could reflect AKI although he has been  at similar levels in the past.  His anion gap  was 20.  Mild leukocytosis of 11.2. [CF]  0045 The patient currently does not have a source of infection given that his urine is clear and his chest x-ray is normal and he has no altered mental status  nor evidence of meningismus.  I will obtain CT imaging given that he is a poor historian and no details were provided from his facility at all.  I ordered head CT without contrast given questionable altered mental status as well as CT chest/abdomen/pelvis to further evaluate the source of his apparent severe sepsis. [CF]  0047 SARS Coronavirus 2 by RT PCR: NEGATIVE [CF]  0108 Sepsis reassessment completed.  Patient is more alert and oriented.  Continues to report pain in his middle and lower back which he says is often the case.  I ordered an additional 4 mg of IV morphine.  CT scans pending.  Of note, vital signs are normal with a heart rate in the 80s and a blood pressure of 130/60. [CF]  0156 Troponin I (High Sensitivity)(!!): 406 Repeat high-sensitivity troponin has gone up to 406.  Unclear if this is still demand ischemia or representative of NSTEMI.  I ordered aspirin 324 mg and will hold off on heparin until I see the results of his CT scan. [CF]  0207 Still awaiting CT scans, checked with CT technologist about possible delays, awaiting reply  [CF]  0243 Lactic Acid, Venous(!!): 4.9 Substantially improved lactic acid although it is still quite elevated [CF]  0321 I viewed and interpreted the patient's CT head and CT chest/abdomen/pelvis.  No acute abnormalities are evident.  Radiologist report agrees, no clear source of infection or other emergent condition.  Given that the patient was febrile and met criteria for septic shock, I am consulting the hospitalist for admission as he has been hemodynamically stable and does not meet criteria for the ICU.  No indication for lumbar puncture given that the patient has no meningismus and his mental status is improved after his lactic acid came down and we  treated his fever. [CF]  0323 Ordering heparin for probable NSTEMI [CF]  0324 Completed another sepsis reassessment.  Patient remains hemodynamically stable. [CF]  1610 Consulted with Dr. Arville Care with the hospitalist service who will admit [CF]    Clinical Course User Index [CF] Loleta Rose, MD     FINAL CLINICAL IMPRESSION(S) / ED DIAGNOSES   Final diagnoses:  Septic shock (HCC)  Lactic acidosis  Acute kidney injury (HCC)  Altered mental status, unspecified altered mental status type  Elevated troponin level  Demand ischemia  NSTEMI (non-ST elevated myocardial infarction) (HCC)     Rx / DC Orders   ED Discharge Orders     None        Note:  This document was prepared using Dragon voice recognition software and may include unintentional dictation errors.   Loleta Rose, MD 10/17/22 [phone]

## 2022-10-17 ENCOUNTER — Inpatient Hospital Stay
Admit: 2022-10-17 | Discharge: 2022-10-17 | Disposition: A | Discharge status: Home / Self Care | Payer: Medicare Other | Attending: Family Medicine | Admitting: Family Medicine

## 2022-10-17 ENCOUNTER — Inpatient Hospital Stay: Payer: Medicare Other

## 2022-10-17 ENCOUNTER — Emergency Department: Payer: Medicare Other

## 2022-10-17 DIAGNOSIS — F418 Other specified anxiety disorders: Secondary | ICD-10-CM

## 2022-10-17 DIAGNOSIS — R079 Chest pain, unspecified: Secondary | ICD-10-CM | POA: Diagnosis not present

## 2022-10-17 DIAGNOSIS — R6510 Systemic inflammatory response syndrome (SIRS) of non-infectious origin without acute organ dysfunction: Secondary | ICD-10-CM | POA: Diagnosis present

## 2022-10-17 DIAGNOSIS — M6282 Rhabdomyolysis: Secondary | ICD-10-CM | POA: Diagnosis present

## 2022-10-17 DIAGNOSIS — I700 Atherosclerosis of aorta: Secondary | ICD-10-CM | POA: Diagnosis present

## 2022-10-17 DIAGNOSIS — A419 Sepsis, unspecified organism: Secondary | ICD-10-CM | POA: Diagnosis present

## 2022-10-17 DIAGNOSIS — J9601 Acute respiratory failure with hypoxia: Secondary | ICD-10-CM | POA: Diagnosis present

## 2022-10-17 DIAGNOSIS — J90 Pleural effusion, not elsewhere classified: Secondary | ICD-10-CM | POA: Diagnosis not present

## 2022-10-17 DIAGNOSIS — E039 Hypothyroidism, unspecified: Secondary | ICD-10-CM | POA: Diagnosis present

## 2022-10-17 DIAGNOSIS — F17210 Nicotine dependence, cigarettes, uncomplicated: Secondary | ICD-10-CM | POA: Diagnosis not present

## 2022-10-17 DIAGNOSIS — W19XXXA Unspecified fall, initial encounter: Secondary | ICD-10-CM

## 2022-10-17 DIAGNOSIS — Z1152 Encounter for screening for COVID-19: Secondary | ICD-10-CM | POA: Diagnosis not present

## 2022-10-17 DIAGNOSIS — E278 Other specified disorders of adrenal gland: Secondary | ICD-10-CM | POA: Diagnosis present

## 2022-10-17 DIAGNOSIS — D631 Anemia in chronic kidney disease: Secondary | ICD-10-CM | POA: Diagnosis present

## 2022-10-17 DIAGNOSIS — J449 Chronic obstructive pulmonary disease, unspecified: Secondary | ICD-10-CM | POA: Diagnosis present

## 2022-10-17 DIAGNOSIS — W1830XA Fall on same level, unspecified, initial encounter: Secondary | ICD-10-CM | POA: Diagnosis present

## 2022-10-17 DIAGNOSIS — E871 Hypo-osmolality and hyponatremia: Secondary | ICD-10-CM | POA: Diagnosis present

## 2022-10-17 DIAGNOSIS — K760 Fatty (change of) liver, not elsewhere classified: Secondary | ICD-10-CM | POA: Diagnosis present

## 2022-10-17 DIAGNOSIS — G9341 Metabolic encephalopathy: Secondary | ICD-10-CM | POA: Diagnosis present

## 2022-10-17 DIAGNOSIS — R571 Hypovolemic shock: Secondary | ICD-10-CM | POA: Diagnosis present

## 2022-10-17 DIAGNOSIS — R338 Other retention of urine: Secondary | ICD-10-CM | POA: Diagnosis present

## 2022-10-17 DIAGNOSIS — N400 Enlarged prostate without lower urinary tract symptoms: Secondary | ICD-10-CM | POA: Diagnosis present

## 2022-10-17 DIAGNOSIS — W1830XD Fall on same level, unspecified, subsequent encounter: Secondary | ICD-10-CM | POA: Diagnosis not present

## 2022-10-17 DIAGNOSIS — N179 Acute kidney failure, unspecified: Secondary | ICD-10-CM | POA: Diagnosis present

## 2022-10-17 DIAGNOSIS — W19XXXD Unspecified fall, subsequent encounter: Secondary | ICD-10-CM | POA: Diagnosis not present

## 2022-10-17 DIAGNOSIS — N1831 Acute kidney failure, unspecified: Secondary | ICD-10-CM | POA: Diagnosis present

## 2022-10-17 DIAGNOSIS — J9811 Atelectasis: Secondary | ICD-10-CM | POA: Diagnosis present

## 2022-10-17 DIAGNOSIS — B957 Other staphylococcus as the cause of diseases classified elsewhere: Secondary | ICD-10-CM | POA: Diagnosis not present

## 2022-10-17 DIAGNOSIS — N186 End stage renal disease: Secondary | ICD-10-CM | POA: Diagnosis present

## 2022-10-17 DIAGNOSIS — R4182 Altered mental status, unspecified: Secondary | ICD-10-CM | POA: Diagnosis present

## 2022-10-17 DIAGNOSIS — D62 Acute posthemorrhagic anemia: Secondary | ICD-10-CM | POA: Diagnosis not present

## 2022-10-17 DIAGNOSIS — N170 Acute kidney failure with tubular necrosis: Secondary | ICD-10-CM | POA: Diagnosis not present

## 2022-10-17 DIAGNOSIS — F250 Schizoaffective disorder, bipolar type: Secondary | ICD-10-CM | POA: Diagnosis present

## 2022-10-17 DIAGNOSIS — R7989 Other specified abnormal findings of blood chemistry: Secondary | ICD-10-CM | POA: Diagnosis not present

## 2022-10-17 DIAGNOSIS — R6521 Severe sepsis with septic shock: Secondary | ICD-10-CM | POA: Diagnosis present

## 2022-10-17 DIAGNOSIS — I214 Non-ST elevation (NSTEMI) myocardial infarction: Secondary | ICD-10-CM | POA: Diagnosis present

## 2022-10-17 DIAGNOSIS — F0390 Unspecified dementia without behavioral disturbance: Secondary | ICD-10-CM | POA: Diagnosis not present

## 2022-10-17 DIAGNOSIS — Z992 Dependence on renal dialysis: Secondary | ICD-10-CM | POA: Diagnosis not present

## 2022-10-17 DIAGNOSIS — I2489 Other forms of acute ischemic heart disease: Secondary | ICD-10-CM | POA: Diagnosis present

## 2022-10-17 DIAGNOSIS — J690 Pneumonitis due to inhalation of food and vomit: Secondary | ICD-10-CM | POA: Diagnosis not present

## 2022-10-17 DIAGNOSIS — E872 Acidosis, unspecified: Secondary | ICD-10-CM | POA: Diagnosis present

## 2022-10-17 DIAGNOSIS — Z720 Tobacco use: Secondary | ICD-10-CM

## 2022-10-17 LAB — CBC WITH DIFFERENTIAL/PLATELET
Abs Immature Granulocytes: 0.21 10*3/uL — ABNORMAL HIGH (ref 0.00–0.07)
Basophils Absolute: 0 10*3/uL (ref 0.0–0.1)
Basophils Relative: 0 %
Eosinophils Absolute: 0 10*3/uL (ref 0.0–0.5)
Eosinophils Relative: 0 %
HCT: 34.3 % — ABNORMAL LOW (ref 39.0–52.0)
Hemoglobin: 11.5 g/dL — ABNORMAL LOW (ref 13.0–17.0)
Immature Granulocytes: 2 %
Lymphocytes Relative: 24 %
Lymphs Abs: 2.7 10*3/uL (ref 0.7–4.0)
MCH: 32.4 pg (ref 26.0–34.0)
MCHC: 33.5 g/dL (ref 30.0–36.0)
MCV: 96.6 fL (ref 80.0–100.0)
Monocytes Absolute: 1.3 10*3/uL — ABNORMAL HIGH (ref 0.1–1.0)
Monocytes Relative: 12 %
Neutro Abs: 6.8 10*3/uL (ref 1.7–7.7)
Neutrophils Relative %: 62 %
Platelets: 165 10*3/uL (ref 150–400)
RBC: 3.55 MIL/uL — ABNORMAL LOW (ref 4.22–5.81)
RDW: 13.3 % (ref 11.5–15.5)
WBC: 11.2 10*3/uL — ABNORMAL HIGH (ref 4.0–10.5)
nRBC: 0 % (ref 0.0–0.2)

## 2022-10-17 LAB — BASIC METABOLIC PANEL
Anion gap: 7 (ref 5–15)
Anion gap: 8 (ref 5–15)
BUN: 18 mg/dL (ref 8–23)
BUN: 23 mg/dL (ref 8–23)
CO2: 19 mmol/L — ABNORMAL LOW (ref 22–32)
CO2: 19 mmol/L — ABNORMAL LOW (ref 22–32)
Calcium: 7.7 mg/dL — ABNORMAL LOW (ref 8.9–10.3)
Calcium: 7.4 mg/dL — ABNORMAL LOW (ref 8.9–10.3)
Chloride: 99 mmol/L (ref 98–111)
Chloride: 99 mmol/L (ref 98–111)
Creatinine, Ser: 1.6 mg/dL — ABNORMAL HIGH (ref 0.61–1.24)
Creatinine, Ser: 1.96 mg/dL — ABNORMAL HIGH (ref 0.61–1.24)
GFR, Estimated: 47 mL/min — ABNORMAL LOW (ref >60)
GFR, Estimated: 37 mL/min — ABNORMAL LOW (ref >60)
Glucose, Bld: 101 mg/dL — ABNORMAL HIGH (ref 70–99)
Glucose, Bld: 109 mg/dL — ABNORMAL HIGH (ref 70–99)
Potassium: 4.7 mmol/L (ref 3.5–5.1)
Potassium: 4.5 mmol/L (ref 3.5–5.1)
Sodium: 125 mmol/L — ABNORMAL LOW (ref 135–145)
Sodium: 126 mmol/L — ABNORMAL LOW (ref 135–145)

## 2022-10-17 LAB — BLOOD CULTURE ID PANEL (REFLEXED) - BCID2

## 2022-10-17 LAB — LACTIC ACID, PLASMA
Lactic Acid, Venous: 2.7 mmol/L (ref 0.5–1.9)
Lactic Acid, Venous: 3.2 mmol/L (ref 0.5–1.9)
Lactic Acid, Venous: 4.9 mmol/L (ref 0.5–1.9)
Lactic Acid, Venous: >9 mmol/L (ref 0.5–1.9)

## 2022-10-17 LAB — LIPID PANEL
Cholesterol: 111 mg/dL (ref 0–200)
HDL: 46 mg/dL (ref >40)
LDL Cholesterol: 59 mg/dL (ref 0–99)
Total CHOL/HDL Ratio: 2.4 RATIO
Triglycerides: 28 mg/dL (ref <150)
VLDL: 6 mg/dL (ref 0–40)

## 2022-10-17 LAB — HIV ANTIBODY (ROUTINE TESTING W REFLEX): HIV Screen 4th Generation wRfx: NONREACTIVE

## 2022-10-17 LAB — PROTIME-INR
INR: 1.2 (ref 0.8–1.2)
Prothrombin Time: 15.7 seconds — ABNORMAL HIGH (ref 11.4–15.2)

## 2022-10-17 LAB — TROPONIN I (HIGH SENSITIVITY)
Troponin I (High Sensitivity): 172 ng/L (ref <18)
Troponin I (High Sensitivity): 406 ng/L (ref <18)
Troponin I (High Sensitivity): 784 ng/L (ref <18)
Troponin I (High Sensitivity): 804 ng/L (ref <18)

## 2022-10-17 LAB — RESPIRATORY PANEL BY PCR

## 2022-10-17 LAB — URINALYSIS, W/ REFLEX TO CULTURE (INFECTION SUSPECTED)
Bacteria, UA: NONE SEEN
Bilirubin Urine: NEGATIVE
Glucose, UA: NEGATIVE mg/dL
Hgb urine dipstick: NEGATIVE
Ketones, ur: NEGATIVE mg/dL
Leukocytes,Ua: NEGATIVE
Nitrite: NEGATIVE
Protein, ur: NEGATIVE mg/dL
Specific Gravity, Urine: 1.008 (ref 1.005–1.030)
Squamous Epithelial / HPF: NONE SEEN /HPF (ref 0–5)
WBC, UA: NONE SEEN WBC/hpf (ref 0–5)
pH: 6 (ref 5.0–8.0)

## 2022-10-17 LAB — COMPREHENSIVE METABOLIC PANEL
ALT: 10 U/L (ref 0–44)
AST: 30 U/L (ref 15–41)
Albumin: 3.3 g/dL — ABNORMAL LOW (ref 3.5–5.0)
Alkaline Phosphatase: 44 U/L (ref 38–126)
Anion gap: 20 — ABNORMAL HIGH (ref 5–15)
BUN: 18 mg/dL (ref 8–23)
CO2: 11 mmol/L — ABNORMAL LOW (ref 22–32)
Calcium: 8.1 mg/dL — ABNORMAL LOW (ref 8.9–10.3)
Chloride: 92 mmol/L — ABNORMAL LOW (ref 98–111)
Creatinine, Ser: 1.78 mg/dL — ABNORMAL HIGH (ref 0.61–1.24)
GFR, Estimated: 41 mL/min — ABNORMAL LOW (ref >60)
Glucose, Bld: 159 mg/dL — ABNORMAL HIGH (ref 70–99)
Potassium: 4.4 mmol/L (ref 3.5–5.1)
Sodium: 123 mmol/L — ABNORMAL LOW (ref 135–145)
Total Bilirubin: 0.6 mg/dL (ref 0.3–1.2)
Total Protein: 6 g/dL — ABNORMAL LOW (ref 6.5–8.1)

## 2022-10-17 LAB — CK
Total CK: 39792 U/L — ABNORMAL HIGH (ref 49–397)
Total CK: >50000 U/L — ABNORMAL HIGH (ref 49–397)

## 2022-10-17 LAB — LIPASE, BLOOD: Lipase: 38 U/L (ref 11–51)

## 2022-10-17 LAB — BLOOD GAS, VENOUS
Acid-base deficit: 11.9 mmol/L — ABNORMAL HIGH (ref 0.0–2.0)
Bicarbonate: 12.8 mmol/L — ABNORMAL LOW (ref 20.0–28.0)
O2 Saturation: 98.5 %
Patient temperature: 37
pCO2, Ven: 26 mmHg — ABNORMAL LOW (ref 44–60)
pH, Ven: 7.3 (ref 7.25–7.43)
pO2, Ven: 90 mmHg — ABNORMAL HIGH (ref 32–45)

## 2022-10-17 LAB — URINE DRUG SCREEN, QUALITATIVE (ARMC ONLY)
Amphetamines, Ur Screen: NOT DETECTED
Barbiturates, Ur Screen: NOT DETECTED
Benzodiazepine, Ur Scrn: NOT DETECTED
Cannabinoid 50 Ng, Ur ~~LOC~~: NOT DETECTED
Cocaine Metabolite,Ur ~~LOC~~: NOT DETECTED
MDMA (Ecstasy)Ur Screen: NOT DETECTED
Methadone Scn, Ur: NOT DETECTED
Opiate, Ur Screen: NOT DETECTED
Phencyclidine (PCP) Ur S: NOT DETECTED
Tricyclic, Ur Screen: NOT DETECTED

## 2022-10-17 LAB — HEPARIN LEVEL (UNFRACTIONATED)
Heparin Unfractionated: >1.1 IU/mL — ABNORMAL HIGH (ref 0.30–0.70)
Heparin Unfractionated: 0.82 IU/mL — ABNORMAL HIGH (ref 0.30–0.70)

## 2022-10-17 LAB — SARS CORONAVIRUS 2 BY RT PCR: SARS Coronavirus 2 by RT PCR: NEGATIVE

## 2022-10-17 LAB — MAGNESIUM: Magnesium: 2.3 mg/dL (ref 1.7–2.4)

## 2022-10-17 LAB — PHOSPHORUS: Phosphorus: 3.5 mg/dL (ref 2.5–4.6)

## 2022-10-17 LAB — ECHOCARDIOGRAM COMPLETE: Height: 69 in

## 2022-10-17 LAB — SODIUM, URINE, RANDOM: Sodium, Ur: 28 mmol/L

## 2022-10-17 LAB — GLUCOSE, CAPILLARY: Glucose-Capillary: 121 mg/dL — ABNORMAL HIGH (ref 70–99)

## 2022-10-17 LAB — APTT: aPTT: 26 seconds (ref 24–36)

## 2022-10-17 LAB — OSMOLALITY: Osmolality: 272 mOsm/kg — ABNORMAL LOW (ref 275–295)

## 2022-10-17 LAB — OSMOLALITY, URINE: Osmolality, Ur: 272 mOsm/kg — ABNORMAL LOW (ref 300–900)

## 2022-10-17 MED ORDER — LACTATED RINGERS IV SOLN
150.0000 mL/h | INTRAVENOUS | Status: DC
  Administered 2022-10-17: 150 mL/h via INTRAVENOUS

## 2022-10-17 MED ORDER — ALUM & MAG HYDROXIDE-SIMETH 200-200-20 MG/5ML PO SUSP
30.0000 mL | Freq: Every day | ORAL | Status: DC | PRN
  Administered 2022-10-27: 30 mL via ORAL
  Filled 2022-10-17: qty 30

## 2022-10-17 MED ORDER — ALBUTEROL SULFATE (2.5 MG/3ML) 0.083% IN NEBU: 2.5000 mg | INHALATION_SOLUTION | RESPIRATORY_TRACT | Status: DC | PRN

## 2022-10-17 MED ORDER — MORPHINE SULFATE (PF) 4 MG/ML IV SOLN
4.0000 mg | Freq: Once | INTRAVENOUS | Status: AC
  Administered 2022-10-17: 4 mg via INTRAVENOUS
  Filled 2022-10-17: qty 1

## 2022-10-17 MED ORDER — METHOCARBAMOL 500 MG PO TABS: 500.0000 mg | ORAL_TABLET | Freq: Three times a day (TID) | ORAL | Status: DC | PRN

## 2022-10-17 MED ORDER — HEPARIN (PORCINE) 25000 UT/250ML-% IV SOLN
1200.0000 [IU]/h | INTRAVENOUS | Status: DC
  Administered 2022-10-17: 1200 [IU]/h via INTRAVENOUS
  Filled 2022-10-17: qty 250

## 2022-10-17 MED ORDER — MAGNESIUM HYDROXIDE 400 MG/5ML PO SUSP: 30.0000 mL | Freq: Every day | ORAL | Status: DC | PRN

## 2022-10-17 MED ORDER — LEVOTHYROXINE SODIUM 50 MCG PO TABS
75.0000 ug | ORAL_TABLET | Freq: Every day | ORAL | Status: DC
  Administered 2022-10-18 – 2022-10-23 (×3): 75 ug via ORAL
  Filled 2022-10-17: qty 1
  Filled 2022-10-17 (×2): qty 2

## 2022-10-17 MED ORDER — ENOXAPARIN SODIUM 40 MG/0.4ML IJ SOSY: 40.0000 mg | PREFILLED_SYRINGE | INTRAMUSCULAR | Status: DC

## 2022-10-17 MED ORDER — ACETAMINOPHEN 650 MG RE SUPP: 650.0000 mg | Freq: Four times a day (QID) | RECTAL | Status: DC | PRN

## 2022-10-17 MED ORDER — CEFEPIME HCL 2 G IV SOLR
2.0000 g | Freq: Once | INTRAVENOUS | Status: DC
Start: 2022-10-17 — End: 2022-10-17

## 2022-10-17 MED ORDER — VITAMIN B-12 100 MCG PO TABS
100.0000 ug | ORAL_TABLET | Freq: Every day | ORAL | Status: DC
  Administered 2022-10-17: 100 ug via ORAL
  Filled 2022-10-17 (×2): qty 1

## 2022-10-17 MED ORDER — LACTATED RINGERS IV BOLUS (SEPSIS)
1000.0000 mL | Freq: Once | INTRAVENOUS | Status: AC
  Administered 2022-10-17: 1000 mL via INTRAVENOUS

## 2022-10-17 MED ORDER — SODIUM CHLORIDE 1 G PO TABS
1.0000 g | ORAL_TABLET | Freq: Two times a day (BID) | ORAL | Status: DC
  Administered 2022-10-17: 1 g via ORAL
  Filled 2022-10-17 (×8): qty 1

## 2022-10-17 MED ORDER — VANCOMYCIN HCL IN DEXTROSE 1-5 GM/200ML-% IV SOLN
1000.0000 mg | Freq: Once | INTRAVENOUS | Status: DC
Start: 2022-10-17 — End: 2022-10-17

## 2022-10-17 MED ORDER — BISMUTH SUBSALICYLATE 262 MG/15ML PO SUSP: 15.0000 mL | ORAL | Status: DC | PRN

## 2022-10-17 MED ORDER — METRONIDAZOLE 500 MG/100ML IV SOLN
500.0000 mg | Freq: Two times a day (BID) | INTRAVENOUS | Status: DC
  Administered 2022-10-17 (×2): 500 mg via INTRAVENOUS
  Filled 2022-10-17 (×3): qty 100

## 2022-10-17 MED ORDER — GUAIFENESIN 100 MG/5ML PO LIQD: 200.0000 mg | Freq: Four times a day (QID) | ORAL | Status: DC | PRN

## 2022-10-17 MED ORDER — ONDANSETRON HCL 4 MG PO TABS: 4.0000 mg | ORAL_TABLET | Freq: Four times a day (QID) | ORAL | Status: DC | PRN

## 2022-10-17 MED ORDER — VANCOMYCIN HCL 1250 MG/250ML IV SOLN: 1250.0000 mg | INTRAVENOUS | Status: DC

## 2022-10-17 MED ORDER — ALBUTEROL SULFATE HFA 108 (90 BASE) MCG/ACT IN AERS: 2.0000 | INHALATION_SPRAY | RESPIRATORY_TRACT | Status: DC | PRN

## 2022-10-17 MED ORDER — HEPARIN (PORCINE) 25000 UT/250ML-% IV SOLN
750.0000 [IU]/h | INTRAVENOUS | Status: DC
  Administered 2022-10-17: 950 [IU]/h via INTRAVENOUS
  Administered 2022-10-18: 750 [IU]/h via INTRAVENOUS
  Filled 2022-10-17: qty 250

## 2022-10-17 MED ORDER — CLOZAPINE 100 MG PO TABS
100.0000 mg | ORAL_TABLET | Freq: Every day | ORAL | Status: DC
  Administered 2022-10-17 – 2022-10-22 (×3): 100 mg via ORAL
  Filled 2022-10-17 (×9): qty 1

## 2022-10-17 MED ORDER — ONDANSETRON HCL 4 MG/2ML IJ SOLN
4.0000 mg | INTRAMUSCULAR | Status: AC
  Administered 2022-10-17: 4 mg via INTRAVENOUS
  Filled 2022-10-17: qty 2

## 2022-10-17 MED ORDER — SODIUM CHLORIDE 0.9 % IV BOLUS
1000.0000 mL | Freq: Once | INTRAVENOUS | Status: AC
  Administered 2022-10-17: 1000 mL via INTRAVENOUS

## 2022-10-17 MED ORDER — FINASTERIDE 5 MG PO TABS
5.0000 mg | ORAL_TABLET | Freq: Every day | ORAL | Status: DC
  Administered 2022-10-17 – 2022-10-22 (×3): 5 mg via ORAL
  Filled 2022-10-17 (×4): qty 1

## 2022-10-17 MED ORDER — ASPIRIN 81 MG PO TBEC
81.0000 mg | DELAYED_RELEASE_TABLET | Freq: Every day | ORAL | Status: DC
  Filled 2022-10-17: qty 1

## 2022-10-17 MED ORDER — MIDODRINE HCL 5 MG PO TABS
10.0000 mg | ORAL_TABLET | Freq: Three times a day (TID) | ORAL | Status: DC
  Filled 2022-10-17 (×3): qty 2

## 2022-10-17 MED ORDER — ASPIRIN 81 MG PO CHEW
324.0000 mg | CHEWABLE_TABLET | Freq: Once | ORAL | Status: AC
  Administered 2022-10-17: 324 mg via ORAL
  Filled 2022-10-17: qty 4

## 2022-10-17 MED ORDER — GLYCOPYRROLATE 1 MG PO TABS
1.0000 mg | ORAL_TABLET | Freq: Every day | ORAL | Status: DC
  Administered 2022-10-17 – 2022-10-22 (×2): 1 mg via ORAL
  Filled 2022-10-17 (×9): qty 1

## 2022-10-17 MED ORDER — DIVALPROEX SODIUM ER 500 MG PO TB24
1000.0000 mg | ORAL_TABLET | Freq: Every day | ORAL | Status: DC
  Administered 2022-10-17: 1000 mg via ORAL
  Filled 2022-10-17: qty 2

## 2022-10-17 MED ORDER — PANTOPRAZOLE SODIUM 40 MG PO TBEC
40.0000 mg | DELAYED_RELEASE_TABLET | Freq: Every day | ORAL | Status: DC
  Administered 2022-10-17: 40 mg via ORAL
  Filled 2022-10-17 (×4): qty 1

## 2022-10-17 MED ORDER — VITAMIN D 25 MCG (1000 UNIT) PO TABS
2000.0000 [IU] | ORAL_TABLET | Freq: Every day | ORAL | Status: DC
  Administered 2022-10-17: 2000 [IU] via ORAL
  Filled 2022-10-17 (×4): qty 2

## 2022-10-17 MED ORDER — LOPERAMIDE HCL 2 MG PO CAPS
2.0000 mg | ORAL_CAPSULE | ORAL | Status: DC | PRN
  Administered 2022-10-27 – 2022-10-31 (×4): 2 mg via ORAL
  Filled 2022-10-17 (×4): qty 1

## 2022-10-17 MED ORDER — HEPARIN BOLUS VIA INFUSION
4000.0000 [IU] | Freq: Once | INTRAVENOUS | Status: AC
  Administered 2022-10-17: 4000 [IU] via INTRAVENOUS
  Filled 2022-10-17: qty 4000

## 2022-10-17 MED ORDER — ACETAMINOPHEN 500 MG PO TABS
1000.0000 mg | ORAL_TABLET | Freq: Once | ORAL | Status: AC
  Administered 2022-10-17: 1000 mg via ORAL
  Filled 2022-10-17: qty 2

## 2022-10-17 MED ORDER — CLOZAPINE 100 MG PO TABS: 100.0000 mg | ORAL_TABLET | Freq: Two times a day (BID) | ORAL | Status: DC

## 2022-10-17 MED ORDER — BENZTROPINE MESYLATE 1 MG PO TABS
2.0000 mg | ORAL_TABLET | Freq: Two times a day (BID) | ORAL | Status: DC
  Administered 2022-10-17 (×2): 2 mg via ORAL
  Filled 2022-10-17 (×4): qty 2

## 2022-10-17 MED ORDER — MECLIZINE HCL 25 MG PO TABS: 25.0000 mg | ORAL_TABLET | Freq: Three times a day (TID) | ORAL | Status: DC | PRN

## 2022-10-17 MED ORDER — LINEZOLID 600 MG/300ML IV SOLN
600.0000 mg | Freq: Two times a day (BID) | INTRAVENOUS | Status: DC
  Administered 2022-10-17 – 2022-10-22 (×11): 600 mg via INTRAVENOUS
  Filled 2022-10-17 (×11): qty 300

## 2022-10-17 MED ORDER — SENNOSIDES-DOCUSATE SODIUM 8.6-50 MG PO TABS
1.0000 | ORAL_TABLET | Freq: Two times a day (BID) | ORAL | Status: DC
  Administered 2022-10-17 – 2022-10-22 (×6): 1 via ORAL
  Filled 2022-10-17 (×8): qty 1

## 2022-10-17 MED ORDER — SODIUM CHLORIDE 0.9 % IV SOLN: INTRAVENOUS | Status: DC

## 2022-10-17 MED ORDER — BUSPIRONE HCL 10 MG PO TABS
15.0000 mg | ORAL_TABLET | Freq: Two times a day (BID) | ORAL | Status: DC
  Administered 2022-10-17 – 2022-10-22 (×6): 15 mg via ORAL
  Filled 2022-10-17 (×8): qty 2

## 2022-10-17 MED ORDER — POLYETHYLENE GLYCOL 3350 17 G PO PACK: 17.0000 g | PACK | Freq: Every day | ORAL | Status: DC | PRN

## 2022-10-17 MED ORDER — VALBENAZINE TOSYLATE 40 MG PO CAPS
80.0000 mg | ORAL_CAPSULE | Freq: Every day | ORAL | Status: DC
  Administered 2022-10-17 – 2022-10-22 (×2): 80 mg via ORAL
  Filled 2022-10-17 (×9): qty 2

## 2022-10-17 MED ORDER — ALBUMIN HUMAN 25 % IV SOLN
25.0000 g | Freq: Once | INTRAVENOUS | Status: AC
  Administered 2022-10-17: 12.5 g via INTRAVENOUS
  Filled 2022-10-17: qty 100

## 2022-10-17 MED ORDER — MAGNESIUM HYDROXIDE 400 MG/5ML PO SUSP
30.0000 mL | Freq: Every day | ORAL | Status: DC | PRN
  Filled 2022-10-17: qty 30

## 2022-10-17 MED ORDER — ONDANSETRON HCL 4 MG/2ML IJ SOLN: 4.0000 mg | Freq: Four times a day (QID) | INTRAMUSCULAR | Status: DC | PRN

## 2022-10-17 MED ORDER — SODIUM CHLORIDE 0.9 % IV BOLUS
500.0000 mL | Freq: Once | INTRAVENOUS | Status: AC
  Administered 2022-10-17: 500 mL via INTRAVENOUS

## 2022-10-17 MED ORDER — METRONIDAZOLE 500 MG/100ML IV SOLN
500.0000 mg | Freq: Once | INTRAVENOUS | Status: AC
  Administered 2022-10-17: 500 mg via INTRAVENOUS
  Filled 2022-10-17: qty 100

## 2022-10-17 MED ORDER — TRAZODONE HCL 50 MG PO TABS
50.0000 mg | ORAL_TABLET | Freq: Every day | ORAL | Status: DC
  Administered 2022-10-17 – 2022-10-22 (×3): 50 mg via ORAL
  Filled 2022-10-17 (×4): qty 1

## 2022-10-17 MED ORDER — IOHEXOL 300 MG/ML  SOLN
100.0000 mL | Freq: Once | INTRAMUSCULAR | Status: AC | PRN
  Administered 2022-10-17: 80 mL via INTRAVENOUS

## 2022-10-17 MED ORDER — HYDROCORTISONE SOD SUC (PF) 100 MG IJ SOLR
100.0000 mg | Freq: Once | INTRAMUSCULAR | Status: AC
  Administered 2022-10-17: 100 mg via INTRAVENOUS
  Filled 2022-10-17: qty 2

## 2022-10-17 MED ORDER — ACETAMINOPHEN 325 MG PO TABS
650.0000 mg | ORAL_TABLET | Freq: Four times a day (QID) | ORAL | Status: DC | PRN
  Administered 2022-10-27 – 2022-10-28 (×4): 650 mg via ORAL
  Filled 2022-10-17 (×3): qty 2

## 2022-10-17 MED ORDER — ATORVASTATIN CALCIUM 20 MG PO TABS
40.0000 mg | ORAL_TABLET | Freq: Every day | ORAL | Status: DC
  Administered 2022-10-17: 40 mg via ORAL
  Filled 2022-10-17: qty 2

## 2022-10-17 MED ORDER — CLOZAPINE 100 MG PO TABS
200.0000 mg | ORAL_TABLET | Freq: Every day | ORAL | Status: DC
  Administered 2022-10-17 – 2022-10-22 (×3): 200 mg via ORAL
  Filled 2022-10-17 (×8): qty 2

## 2022-10-17 MED ORDER — SODIUM CHLORIDE 0.9 % IV SOLN
2.0000 g | Freq: Two times a day (BID) | INTRAVENOUS | Status: DC
  Administered 2022-10-17 (×2): 2 g via INTRAVENOUS
  Filled 2022-10-17 (×3): qty 12.5

## 2022-10-17 MED ORDER — DOXAZOSIN MESYLATE 4 MG PO TABS
4.0000 mg | ORAL_TABLET | Freq: Every day | ORAL | Status: DC
  Administered 2022-10-17: 4 mg via ORAL
  Filled 2022-10-17 (×2): qty 1

## 2022-10-17 MED ORDER — NICOTINE 21 MG/24HR TD PT24
21.0000 mg | MEDICATED_PATCH | Freq: Every day | TRANSDERMAL | Status: DC
  Administered 2022-10-17 – 2022-11-04 (×12): 21 mg via TRANSDERMAL
  Filled 2022-10-17 (×18): qty 1

## 2022-10-17 MED ORDER — OXYCODONE-ACETAMINOPHEN 5-325 MG PO TABS
1.0000 | ORAL_TABLET | ORAL | Status: DC | PRN
  Administered 2022-10-26 – 2022-11-04 (×16): 1 via ORAL
  Filled 2022-10-17 (×17): qty 1

## 2022-10-17 MED ORDER — GABAPENTIN 300 MG PO CAPS
300.0000 mg | ORAL_CAPSULE | Freq: Three times a day (TID) | ORAL | Status: DC
  Administered 2022-10-17 (×3): 300 mg via ORAL
  Filled 2022-10-17 (×4): qty 1

## 2022-10-17 NOTE — Progress Notes (Signed)
ANTICOAGULATION CONSULT NOTE  Pharmacy Consult for heparin infusion Indication: ACS/STEMI  Allergies  Allergen Reactions   Haldol [Haloperidol Lactate] Other (See Comments)    Tardive diskonesia   Other     Navy Beans cause patient to vomit   Thorazine [Chlorpromazine] Other (See Comments)    "messes me all up"   Trazodone And Nefazodone Other (See Comments)    "makes me feel like I'm dying."    Patient Measurements: Height: 5\' 9"  (175.3 cm) Weight: 90.7 kg (200 lb) IBW/kg (Calculated) : 70.7 Heparin Dosing Weight: 89.1 kg  Vital Signs: Temp: 101 F (38.3 C) (06/29 2336) Temp Source: Rectal (06/29 2336) BP: 107/65 (06/30 0200) Pulse Rate: 64 (06/30 0215)  Labs: Recent Labs    10/16/22 2344 10/17/22 0114  HGB 11.5*  --   HCT 34.3*  --   PLT 165  --   LABPROT 15.7*  --   INR 1.2  --   CREATININE 1.78*  --   TROPONINIHS 172* 406*    Estimated Creatinine Clearance: 44.2 mL/min (A) (by C-G formula based on SCr of 1.78 mg/dL (H)).   Medical History: Past Medical History:  Diagnosis Date   COPD (chronic obstructive pulmonary disease) (HCC)    Schizoaffective disorder, bipolar type (HCC)    Sleep apnea    Tardive dyskinesia    Thyroid disease     Assessment: Pt is a 67 yo male presenting to ED from care facility after a fall, found with elevated troponin I level, trending up.  Goal of Therapy:  Heparin level 0.3-0.7 units/ml Monitor platelets by anticoagulation protocol: Yes   Plan:  Bolus 4000 units x 1 Start heparin infusion at 1200 units/hr Will check HL in 6 hr after start of infusion CBC daily while on heparin  Otelia Sergeant, PharmD, [facility] 10/17/2022 3:37 AM

## 2022-10-17 NOTE — Plan of Care (Signed)

## 2022-10-17 NOTE — Progress Notes (Signed)
Pharmacy Antibiotic Note  Austin Marsh is a 67 y.o. male admitted on 10/16/2022 with infection of unknown source.  Pharmacy has been consulted for Cefepime & Vancomycin dosing for 7 days.  Plan: Cefepime 2 gm q112hr per indication & renal fxn.  Pt given Vancomycin 2000 mg once. Vancomycin 1250 mg IV Q 24 hrs. Goal AUC 400-550. Expected AUC: 528.6 SCr used: 1.78  Pharmacy will continue to follow and will adjust abx dosing whenever warranted.  Temp (24hrs), Avg:99.2 F (37.3 C), Min:97.7 F (36.5 C), Max:101 F (38.3 C)   Recent Labs  Lab 10/16/22 2344 10/17/22 0132  WBC 11.2*  --   CREATININE 1.78*  --   LATICACIDVEN >9.0* 4.9*    Estimated Creatinine Clearance: 43.6 mL/min (A) (by C-G formula based on SCr of 1.78 mg/dL (H)).    Allergies  Allergen Reactions   Haldol [Haloperidol Lactate] Other (See Comments)    Tardive diskonesia   Other     Navy Beans cause patient to vomit   Thorazine [Chlorpromazine] Other (See Comments)    "messes me all up"   Trazodone And Nefazodone Other (See Comments)    "makes me feel like I'm dying."    Antimicrobials this admission: 6/29 Cefepime >> x 7 days 6/30 Vancomycin >> x 7 days 6/30 Flagyl >> x 7 days  Microbiology results: 6/29 BCx: Pending  Thank you for allowing pharmacy to be a part of this patient's care.  Otelia Sergeant, PharmD, [facility] 10/17/2022 5:31 AM

## 2022-10-17 NOTE — Progress Notes (Signed)
ANTICOAGULATION CONSULT NOTE  Pharmacy Consult for heparin infusion Indication: ACS/STEMI  Allergies  Allergen Reactions   Haldol [Haloperidol Lactate] Other (See Comments)    Tardive diskonesia   Other     Navy Beans cause patient to vomit   Thorazine [Chlorpromazine] Other (See Comments)    "messes me all up"   Trazodone And Nefazodone Other (See Comments)    "makes me feel like I'm dying."    Patient Measurements: Height: 5\' 9"  (175.3 cm) Weight: 87.9 kg (193 lb 12.6 oz) IBW/kg (Calculated) : 70.7 Heparin Dosing Weight: 89.1 kg  Vital Signs: Temp: 97.4 F (36.3 C) (06/30 0959) Temp Source: Oral (06/30 0959) BP: 118/66 (06/30 0959) Pulse Rate: 63 (06/30 0959)  Labs: Recent Labs    10/16/22 2344 10/17/22 0114 10/17/22 0342 10/17/22 0534 10/17/22 0725 10/17/22 1138  HGB 11.5*  --   --   --   --   --   HCT 34.3*  --   --   --   --   --   PLT 165  --   --   --   --   --   APTT  --   --  26  --   --   --   LABPROT 15.7*  --   --   --   --   --   INR 1.2  --   --   --   --   --   HEPARINUNFRC  --   --   --   --   --  >1.10*  CREATININE 1.78*  --   --   --  1.60*  --   TROPONINIHS 172* 406*  --  804* 784*  --      Estimated Creatinine Clearance: 48.5 mL/min (A) (by C-G formula based on SCr of 1.6 mg/dL (H)).   Medical History: Past Medical History:  Diagnosis Date   COPD (chronic obstructive pulmonary disease) (HCC)    Schizoaffective disorder, bipolar type (HCC)    Sleep apnea    Tardive dyskinesia    Thyroid disease     Assessment: Pt is a 67 yo male presenting to ED from care facility after a fall, found with elevated troponin I level, trending up.  6/30 1138 HL > 1.1   Goal of Therapy:  Heparin level 0.3-0.7 units/ml Monitor platelets by anticoagulation protocol: Yes   Plan:  Heparin level is supratherapeutic. Will hold heparin for 1 hours and restart heparin infusion at 950 units/hr. Recheck heparin level in 6 hours CBC daily while on heparin.     Paschal Dopp, PharmD, 10/17/2022 12:22 PM

## 2022-10-17 NOTE — H&P (Addendum)
History and Physical    Austin Marsh ZOX:[id number] DOB: [birth year] DOA: 10/16/2022  Referring MD/NP/PA:   PCP: Austin Bound, NP   Patient coming from:  The patient is coming from family care facility   Chief Complaint: fall and AMS  HPI: Austin Marsh is a 67 y.o. male with medical history significant of COPD, hypothyroidism, depression with anxiety, BPH, CKD-3A, schizoaffective disorder with bipolar, tardive dyskinesia, OSA on CPAP, who presents with fall and AMS.  Patient has AMS, and can only provide limited information, therefore, I called his caregiver in family care facility.    Per his caregiver, pt was in his normal health condition until 10:30 PM yesterday when pt had fall and was found on the floor. Per care giver, at normal baseline, patient is alert oriented x 3.  Patient is confused today.  When I saw patient, patient is lethargic, oriented to person and place, but confused about the year.  He moves all extremities, but complains of pain in the right hip when moving his right leg. Also seems to have pain in left hip. He has lower back pain. Patient denies headache or neck pain.  His neck is supple. He states that he has some chest pain, but cannot provide further information to characterize his chest pain.  No shortness of breath or cough.  Denies nausea, vomiting, diarrhea.  Patient is constipated.  He reports suprapubic pain, found to have acute urinary retention.  Bladder scan showed 973 cc residual volume.  Foley catheter is placed.  Patient denies symptoms of UTI.  Denies rectal bleeding or dark stool. Pt was found to have fever of 101 in ED.  Per report, patient had low blood pressure, but his blood pressure is 124/7380.    Data reviewed independently and ED Course: pt was found to have WBC 11.2, worsening renal function, sodium 123, negative COVID PCR, lactic acid> 9.0 ---> 4.9, troponin level 172, 406, 804, 784.  Sodium 123, magnesium 2.3, phosphorus 3.5, potassium  4.4.  Temperature 101, blood pressure 124/73, heart rate of 123, RR 26, oxygen saturation 92-99% on room air.  Chest x-ray negative.  CT of head negative for acute intracranial abnormalities.  X-ray of bilateral hips/pelvis negative for fracture.  X-ray of L-spine and T-spine negative for acute fracture, did showed degenerative disc disease.  Patient is admitted to PCU as inpatient.  CT of the chest/pelvis/abdomen: 1. No acute intracranial abnormality. No calvarial fracture. 2. Moderate coronary artery calcification. 3. No acute intrathoracic or intra-abdominal pathology identified. 4. Stable 9 mm right adrenal adenoma. Stable changes of adrenal hyperplasia. 5. Marked distention of the bladder possibly reflecting changes of bladder outlet obstruction. 6. Mild prostatic hypertrophy.  Probable post TURP changes.   Aortic Atherosclerosis (ICD10-I70.0).    EKG: I have personally reviewed.  Sinus rhythm, QTc 440, low voltage, and stable baseline  presents with fall and AMS.   Review of Systems: Could not reviewed accurately due to altered mental status.   Allergy:  Allergies  Allergen Reactions   Haldol [Haloperidol Lactate] Other (See Comments)    Tardive diskonesia   Other     [employer] cause patient to vomit   Thorazine [Chlorpromazine] Other (See Comments)    "messes me all up"   Trazodone And Nefazodone Other (See Comments)    "makes me feel like I'm dying."    Past Medical History:  Diagnosis Date   COPD (chronic obstructive pulmonary disease) (HCC)    Schizoaffective disorder, bipolar type (HCC)  Sleep apnea    Tardive dyskinesia    Thyroid disease     Past Surgical History:  Procedure Laterality Date   APPENDECTOMY      Social History:  reports that he has been smoking cigarettes. He has never used smokeless tobacco. He reports that he does not currently use alcohol. He reports that he does not currently use drugs.  Family History: History reviewed. No  pertinent family history.  Could not be reviewed accurately due to altered mental status.  Prior to Admission medications   Medication Sig Start Date End Date Taking? Authorizing Provider  acetaminophen (TYLENOL) 325 MG tablet Take 650 mg by mouth every 4 (four) hours as needed.   Yes [provider]  albuterol (VENTOLIN HFA) 108 (90 Base) MCG/ACT inhaler Inhale 2 puffs into the lungs every 4 (four) hours as needed for wheezing or shortness of breath. 01/23/20  Yes Shaune Pollack, MD  alum & mag hydroxide-simeth (MAALOX/MYLANTA) 200-200-20 MG/5ML suspension Take 30 mLs by mouth daily as needed for indigestion or heartburn.   Yes [provider]  benztropine (COGENTIN) 2 MG tablet Take 2 mg by mouth 2 (two) times daily.   Yes [provider]  bismuth subsalicylate (PEPTO BISMOL) 262 MG/15ML suspension Take 15 mLs by mouth every 4 (four) hours as needed.   Yes [provider]  busPIRone (BUSPAR) 15 MG tablet Take 15 mg by mouth 2 (two) times daily.   Yes [provider]  cloZAPine (CLOZARIL) 100 MG tablet Take 100-200 mg by mouth 2 (two) times daily. 100 mg qam 200 mg qpm   Yes [provider]  divalproex (DEPAKOTE ER) 500 MG 24 hr tablet Take 1,000 mg by mouth at bedtime.   Yes [provider]  doxazosin (CARDURA) 4 MG tablet Take 4 mg by mouth daily.   Yes [provider]  gabapentin (NEURONTIN) 300 MG capsule Take 300 mg by mouth 3 (three) times daily.   Yes [provider]  glycopyrrolate (ROBINUL) 1 MG tablet Take 1 mg by mouth daily.   Yes [provider]  guaiFENesin (ROBITUSSIN) 100 MG/5ML liquid Take 200 mg by mouth every 6 (six) hours as needed for cough.   Yes [provider]  levothyroxine (SYNTHROID) 100 MCG tablet Take 75 mcg by mouth daily before breakfast.   Yes [provider]  loperamide (IMODIUM) 2 MG capsule Take 2 mg by mouth as needed for diarrhea or loose stools.   Yes  [provider]  magnesium hydroxide (MILK OF MAGNESIA) 400 MG/5ML suspension Take 30 mLs by mouth daily as needed for mild constipation.   Yes [provider]  meclizine (ANTIVERT) 25 MG tablet Take 25 mg by mouth 3 (three) times daily as needed for dizziness.   Yes [provider]  pantoprazole (PROTONIX) 40 MG tablet Take 40 mg by mouth daily.   Yes [provider]  sennosides-docusate sodium (SENOKOT-S) 8.6-50 MG tablet Take 1 tablet by mouth in the morning and at bedtime.   Yes [provider]  traZODone (DESYREL) 50 MG tablet Take 50 mg by mouth at bedtime.   Yes [provider]  valbenazine (INGREZZA) 80 MG capsule Take 80 mg by mouth daily.   Yes [provider]  vitamin B-12 (CYANOCOBALAMIN) 100 MCG tablet Take 100 mcg by mouth daily.   Yes [provider]  Vitamin D, Cholecalciferol, 25 MCG (1000 UT) TABS Take 50 mcg by mouth daily.    Yes [provider]  cephALEXin (KEFLEX) 500 MG capsule Take 1 capsule (500 mg total) by mouth 2 (two) times daily. Patient not taking: Reported on 10/17/2022 05/09/20   Ward, Layla Maw, DO  polyethylene glycol (MIRALAX) 17 g packet Take 17 g by mouth daily. Patient not taking: Reported on 10/17/2022 06/01/21   Georga Hacking, MD    Physical Exam: Vitals:   10/17/22 0459 10/17/22 0600 10/17/22 0959 10/17/22 1303  BP: 124/73  118/66 100/60  Pulse: 85  63 62  Resp: 20 14  20   Temp: 97.7 F (36.5 C)  (!) 97.4 F (36.3 C) (!) 97.3 F (36.3 C)  TempSrc: Oral  Oral   SpO2: 99%  98% 98%  Weight:      Height:       General: Not in acute distress HEENT:       Eyes: PERRL, EOMI, no jaundice       ENT: No discharge from the ears and nose.       Neck: No JVD, no bruit, no mass felt. Heme: No neck lymph node enlargement. Cardiac: S1/S2, RRR, No murmurs, No gallops or rubs. Respiratory: No rales, wheezing, rhonchi or rubs. GI: Soft, nondistended, has suprapubic  tenderness, no rebound pain, no organomegaly, BS present. GU: No hematuria Ext: No pitting leg edema bilaterally. 1+DP/PT pulse bilaterally. Musculoskeletal: Has tenderness in both hips and lower back Skin: No rashes.  Neuro: Lethargic, oriented to person and place, not oriented to time, cranial nerves II-XII grossly intact, moves all extremities Psych: Patient is not psychotic, no suicidal or hemocidal ideation.  Labs on Admission: I have personally reviewed following labs and imaging studies  CBC: Recent Labs  Lab 10/16/22 2344  WBC 11.2*  NEUTROABS 6.8  HGB 11.5*  HCT 34.3*  MCV 96.6  PLT 165   Basic Metabolic Panel: Recent Labs  Lab 10/16/22 2344 10/17/22 0500 10/17/22 0725  NA 123*  --  125*  K 4.4  --  4.7  CL 92*  --  99  CO2 11*  --  19*  GLUCOSE 159*  --  101*  BUN 18  --  18  CREATININE 1.78*  --  1.60*  CALCIUM 8.1*  --  7.7*  MG  --  2.3  --   PHOS  --  3.5  --    GFR: Estimated Creatinine Clearance: 48.5 mL/min (A) (by C-G formula based on SCr of 1.6 mg/dL (H)). Liver Function Tests: Recent Labs  Lab 10/16/22 2344  AST 30  ALT 10  ALKPHOS 44  BILITOT 0.6  PROT 6.0*  ALBUMIN 3.3*   Recent Labs  Lab 10/16/22 2344  LIPASE 38   No results for input(s): "AMMONIA" in the last 168 hours. Coagulation Profile: Recent Labs  Lab 10/16/22 2344  INR 1.2   Cardiac Enzymes: Recent Labs  Lab 10/17/22 0938  CKTOTAL 39,792*   BNP (last 3 results) No results for input(s): "PROBNP" in the last 8760 hours. HbA1C: No results for input(s): "HGBA1C" in the last 72 hours. CBG: No results for input(s): "GLUCAP" in the last 168 hours. Lipid Profile: Recent Labs    10/17/22 0725  CHOL 111  HDL 46  LDLCALC 59  TRIG 28  CHOLHDL 2.4   Thyroid Function Tests: No results for input(s): "TSH", "T4TOTAL", "FREET4", "T3FREE", "THYROIDAB" in the last 72 hours. Anemia Panel: No results for input(s): "VITAMINB12", "FOLATE", "FERRITIN", "TIBC", "IRON",  "RETICCTPCT" in the last 72 hours. Urine analysis:    Component Value Date/Time   COLORURINE YELLOW (  A) 10/16/2022 2344   APPEARANCEUR CLEAR (A) 10/16/2022 2344   APPEARANCEUR Clear 02/17/2012 1315   LABSPEC 1.008 10/16/2022 2344   LABSPEC 1.018 02/17/2012 1315   PHURINE 6.0 10/16/2022 2344   GLUCOSEU NEGATIVE 10/16/2022 2344   GLUCOSEU Negative 02/17/2012 1315   HGBUR NEGATIVE 10/16/2022 2344   BILIRUBINUR NEGATIVE 10/16/2022 2344   BILIRUBINUR Negative 02/17/2012 1315   KETONESUR NEGATIVE 10/16/2022 2344   PROTEINUR NEGATIVE 10/16/2022 2344   UROBILINOGEN 0.2 03/15/2007 2130   NITRITE NEGATIVE 10/16/2022 2344   LEUKOCYTESUR NEGATIVE 10/16/2022 2344   LEUKOCYTESUR Negative 02/17/2012 1315   Sepsis Labs: @LABRCNTIP (procalcitonin:4,lacticidven:4) ) Recent Results (from the past 240 hour(s))  Blood Culture (routine x 2)     Status: None (Preliminary result)   Collection Time: 10/16/22 11:44 PM   Specimen: BLOOD LEFT ARM  Result Value Ref Range Status   Specimen Description BLOOD LEFT ARM  Final   Special Requests   Final    BOTTLES DRAWN AEROBIC AND ANAEROBIC Blood Culture adequate volume   Culture   Final    NO GROWTH < 12 HOURS Performed at [facility], [address] [place], [place] [address]    Report Status PENDING  Incomplete  Blood Culture (routine x 2)     Status: None (Preliminary result)   Collection Time: 10/16/22 11:44 PM   Specimen: BLOOD RIGHT ARM  Result Value Ref Range Status   Specimen Description BLOOD RIGHT ARM  Final   Special Requests   Final    BOTTLES DRAWN AEROBIC AND ANAEROBIC Blood Culture adequate volume   Culture   Final    NO GROWTH < 12 HOURS Performed at [facility], [address] [place], [place] [address]    Report Status PENDING  Incomplete  SARS Coronavirus 2 by RT PCR (hospital order, performed in [facility] hospital lab) *cepheid single result test* Anterior Nasal Swab     Status: None   Collection Time:  10/16/22 11:44 PM   Specimen: Anterior Nasal Swab  Result Value Ref Range Status   SARS Coronavirus 2 by RT PCR NEGATIVE NEGATIVE Final    Comment: (NOTE) SARS-CoV-2 target nucleic acids are NOT DETECTED.  The SARS-CoV-2 RNA is generally detectable in upper and lower respiratory specimens during the acute phase of infection. The lowest concentration of SARS-CoV-2 viral copies this assay can detect is 250 copies / mL. A negative result does not preclude SARS-CoV-2 infection and should not be used as the sole basis for treatment or other patient management decisions.  A negative result may occur with improper specimen collection / handling, submission of specimen other than nasopharyngeal swab, presence of viral mutation(s) within the areas targeted by this assay, and inadequate number of viral copies (<250 copies / mL). A negative result must be combined with clinical observations, patient history, and epidemiological information.  Fact Sheet for Patients:   RoadLapTop.co.za  Fact Sheet for Healthcare Providers: http://kim-miller.com/  This test is not yet approved or  cleared by the [place] FDA and has been authorized for detection and/or diagnosis of SARS-CoV-2 by FDA under an Emergency Use Authorization (EUA).  This EUA will remain in effect (meaning this test can be used) for the duration of the COVID-19 declaration under Section 564(b)(1) of the Act, 21 U.S.C. section 360bbb-3(b)(1), unless the authorization is terminated or revoked sooner.  Performed at [facility], [address] [place], [place] [address]   Respiratory (~20 pathogens) panel by PCR     Status: None  Collection Time: 10/17/22  9:26 AM   Specimen: Nasopharyngeal Swab; Respiratory  Result Value Ref Range Status   Adenovirus NOT DETECTED NOT DETECTED Final   Coronavirus 229E NOT DETECTED NOT DETECTED Final    Comment: (NOTE) The Coronavirus on  the Respiratory Panel, DOES NOT test for the novel  Coronavirus (2019 nCoV)    Coronavirus HKU1 NOT DETECTED NOT DETECTED Final   Coronavirus NL63 NOT DETECTED NOT DETECTED Final   Coronavirus OC43 NOT DETECTED NOT DETECTED Final   Metapneumovirus NOT DETECTED NOT DETECTED Final   Rhinovirus / Enterovirus NOT DETECTED NOT DETECTED Final   Influenza A NOT DETECTED NOT DETECTED Final   Influenza B NOT DETECTED NOT DETECTED Final   Parainfluenza Virus 1 NOT DETECTED NOT DETECTED Final   Parainfluenza Virus 2 NOT DETECTED NOT DETECTED Final   Parainfluenza Virus 3 NOT DETECTED NOT DETECTED Final   Parainfluenza Virus 4 NOT DETECTED NOT DETECTED Final   Respiratory Syncytial Virus NOT DETECTED NOT DETECTED Final   Bordetella pertussis NOT DETECTED NOT DETECTED Final   Bordetella Parapertussis NOT DETECTED NOT DETECTED Final   Chlamydophila pneumoniae NOT DETECTED NOT DETECTED Final   Mycoplasma pneumoniae NOT DETECTED NOT DETECTED Final    Comment: Performed at [facility], [address] [place], [place] [address]     Radiological Exams on Admission: DG Lumbar Spine 2-3 Views  Result Date: 10/17/2022 CLINICAL DATA:  Recent fall.  Pain. EXAM: LUMBAR SPINE - 2-3 VIEW COMPARISON:  CT abdomen and pelvis 06/01/2021 FINDINGS: There are 5 non-rib-bearing lumbar-type vertebral bodies. No sagittal spondylolisthesis. Minimal posterior L5-S1 disc space narrowing. Moderate L5-S1 greater than L4-5 facet joint sclerosis and hypertrophy. Vertebral body heights are maintained. Moderate atherosclerotic calcifications. IMPRESSION: 1. Minimal posterior L5-S1 disc space narrowing. 2. Moderate L5-S1 greater than L4-5 facet degenerative changes. Electronically Signed   By: Neita Garnet M.D.   On: 10/17/2022 12:20   DG Thoracic Spine 2 View  Result Date: 10/17/2022 CLINICAL DATA:  Recent fall.  Pain. EXAM: THORACIC SPINE 2 VIEWS COMPARISON:  AP chest 10/16/2022, chest two views 05/08/2020 FINDINGS: There  are 12 rib-bearing thoracic type vertebral bodies. Normal frontal alignment. No sagittal spondylolisthesis. Mild multilevel anterior disc space narrowing of the upper thoracic spine. Vertebral body heights are maintained. The visualized heart and lungs are grossly unremarkable. IMPRESSION: Mild multilevel degenerative changes of the upper thoracic spine. Electronically Signed   By: Neita Garnet M.D.   On: 10/17/2022 12:17   DG HIPS BILAT WITH PELVIS MIN 5 VIEWS  Result Date: 10/17/2022 CLINICAL DATA:  Recent fall.  Pain. EXAM: DG HIP (WITH OR WITHOUT PELVIS) 5+V BILAT COMPARISON:  Pelvis and left hip radiographs 03/11/2020, CT pelvis 06/01/2021 FINDINGS: There is diffuse decreased bone mineralization. Mild bilateral femoroacetabular joint space narrowing moderate bilateral superolateral acetabular degenerative osteophytes. The bilateral sacroiliac joint spaces are maintained. No acute fracture is seen. No dislocation. IMPRESSION: Mild bilateral femoroacetabular osteoarthritis. No acute fracture. Electronically Signed   By: Neita Garnet M.D.   On: 10/17/2022 12:15   CT Head Wo Contrast  Result Date: 10/17/2022 CLINICAL DATA:  Mental status change, unknown cause; Sepsis EXAM: CT HEAD WITHOUT CONTRAST CT CHEST, ABDOMEN AND PELVIS WITH CONTRAST TECHNIQUE: Contiguous axial images were obtained from the base of the skull through the vertex without intravenous contrast. Multidetector CT imaging of the chest, abdomen and pelvis was performed following the standard protocol during bolus administration of intravenous contrast. RADIATION DOSE REDUCTION: This exam was performed according to the departmental  dose-optimization program which includes automated exposure control, adjustment of the mA and/or kV according to patient size and/or use of iterative reconstruction technique. CONTRAST:  80mL OMNIPAQUE IOHEXOL 300 MG/ML  SOLN COMPARISON:  CT head 05/09/2020, CT chest abdomen pelvis 09/08/2020 FINDINGS: CT HEAD  FINDINGS Brain: Normal anatomic configuration. Parenchymal volume loss is commensurate with the patient's age. No abnormal intra or extra-axial mass lesion or fluid collection. No abnormal mass effect or midline shift. No evidence of acute intracranial hemorrhage or infarct. Ventricular size is normal. Cerebellum unremarkable. Vascular: No asymmetric hyperdense vasculature at the skull base. Skull: Intact Sinuses/Orbits: Paranasal sinuses are clear. Orbits are unremarkable. Other: Mastoid air cells and middle ear cavities are clear. CT CHEST FINDINGS Cardiovascular: Moderate coronary artery calcification within the left anterior descending coronary artery proximally. Global cardiac size within normal limits. No pericardial effusion. Central pulmonary arteries are of normal caliber. Mild atherosclerotic calcification within the thoracic aorta. No aortic aneurysm. Mediastinum/Nodes: No enlarged mediastinal, hilar, or axillary lymph nodes. Thyroid gland, trachea, and esophagus demonstrate no significant findings. Lungs/Pleura: Stable architectural distortion within the upper lobes bilaterally in keeping with probable post infectious or post inflammatory scarring. No superimposed confluent pulmonary infiltrate. Bibasilar atelectasis. No pneumothorax or pleural effusion. No central obstructing lesion. Musculoskeletal: No acute bone abnormality. No lytic or blastic bone lesion. CT ABDOMEN PELVIS FINDINGS Hepatobiliary: Stable scattered probable cysts within the liver. No enhancing intrahepatic mass. Gallbladder unremarkable. No intra or extrahepatic biliary ductal dilation. Pancreas: Unremarkable Spleen: Unremarkable Adrenals/Urinary Tract: Stable thickening of the adrenal glands bilaterally in keeping with adrenal hyperplasia. Stable 9 mm low-attenuation nodule within the right adrenal gland in keeping with an adrenal adenoma. No follow-up imaging is recommended for this lesion. The kidneys are normal in size and  position. Multiple simple cortical cysts are seen within the kidneys bilaterally for which no follow-up imaging is recommended. The kidneys are otherwise unremarkable. The bladder is distended with a punctate focus of intraluminal gas identified possibly related to recent catheterization. Stomach/Bowel: Appendectomy has been performed. The stomach, small bowel, and large bowel are unremarkable. No evidence of obstruction or focal inflammation. No free intraperitoneal gas or fluid. Vascular/Lymphatic: Aortic atherosclerosis. No enlarged abdominal or pelvic lymph nodes. Reproductive: Mild prostatic hypertrophy. Defect within the central prostate gland likely reflects changes of prior trans urethral resection. Other: No abdominal wall hernia or abnormality. No abdominopelvic ascites. Musculoskeletal: No acute bone abnormality. No lytic or blastic bone lesion. IMPRESSION: 1. No acute intracranial abnormality. No calvarial fracture. 2. Moderate coronary artery calcification. 3. No acute intrathoracic or intra-abdominal pathology identified. 4. Stable 9 mm right adrenal adenoma. Stable changes of adrenal hyperplasia. 5. Marked distention of the bladder possibly reflecting changes of bladder outlet obstruction. 6. Mild prostatic hypertrophy.  Probable post TURP changes. Aortic Atherosclerosis (ICD10-I70.0). Electronically Signed   By: Helyn Numbers M.D.   On: 10/17/2022 03:13   CT CHEST ABDOMEN PELVIS W CONTRAST  Result Date: 10/17/2022 CLINICAL DATA:  Mental status change, unknown cause; Sepsis EXAM: CT HEAD WITHOUT CONTRAST CT CHEST, ABDOMEN AND PELVIS WITH CONTRAST TECHNIQUE: Contiguous axial images were obtained from the base of the skull through the vertex without intravenous contrast. Multidetector CT imaging of the chest, abdomen and pelvis was performed following the standard protocol during bolus administration of intravenous contrast. RADIATION DOSE REDUCTION: This exam was performed according to the  departmental dose-optimization program which includes automated exposure control, adjustment of the mA and/or kV according to patient size and/or use of iterative reconstruction technique. CONTRAST:  80mL  OMNIPAQUE IOHEXOL 300 MG/ML  SOLN COMPARISON:  CT head 05/09/2020, CT chest abdomen pelvis 09/08/2020 FINDINGS: CT HEAD FINDINGS Brain: Normal anatomic configuration. Parenchymal volume loss is commensurate with the patient's age. No abnormal intra or extra-axial mass lesion or fluid collection. No abnormal mass effect or midline shift. No evidence of acute intracranial hemorrhage or infarct. Ventricular size is normal. Cerebellum unremarkable. Vascular: No asymmetric hyperdense vasculature at the skull base. Skull: Intact Sinuses/Orbits: Paranasal sinuses are clear. Orbits are unremarkable. Other: Mastoid air cells and middle ear cavities are clear. CT CHEST FINDINGS Cardiovascular: Moderate coronary artery calcification within the left anterior descending coronary artery proximally. Global cardiac size within normal limits. No pericardial effusion. Central pulmonary arteries are of normal caliber. Mild atherosclerotic calcification within the thoracic aorta. No aortic aneurysm. Mediastinum/Nodes: No enlarged mediastinal, hilar, or axillary lymph nodes. Thyroid gland, trachea, and esophagus demonstrate no significant findings. Lungs/Pleura: Stable architectural distortion within the upper lobes bilaterally in keeping with probable post infectious or post inflammatory scarring. No superimposed confluent pulmonary infiltrate. Bibasilar atelectasis. No pneumothorax or pleural effusion. No central obstructing lesion. Musculoskeletal: No acute bone abnormality. No lytic or blastic bone lesion. CT ABDOMEN PELVIS FINDINGS Hepatobiliary: Stable scattered probable cysts within the liver. No enhancing intrahepatic mass. Gallbladder unremarkable. No intra or extrahepatic biliary ductal dilation. Pancreas: Unremarkable  Spleen: Unremarkable Adrenals/Urinary Tract: Stable thickening of the adrenal glands bilaterally in keeping with adrenal hyperplasia. Stable 9 mm low-attenuation nodule within the right adrenal gland in keeping with an adrenal adenoma. No follow-up imaging is recommended for this lesion. The kidneys are normal in size and position. Multiple simple cortical cysts are seen within the kidneys bilaterally for which no follow-up imaging is recommended. The kidneys are otherwise unremarkable. The bladder is distended with a punctate focus of intraluminal gas identified possibly related to recent catheterization. Stomach/Bowel: Appendectomy has been performed. The stomach, small bowel, and large bowel are unremarkable. No evidence of obstruction or focal inflammation. No free intraperitoneal gas or fluid. Vascular/Lymphatic: Aortic atherosclerosis. No enlarged abdominal or pelvic lymph nodes. Reproductive: Mild prostatic hypertrophy. Defect within the central prostate gland likely reflects changes of prior trans urethral resection. Other: No abdominal wall hernia or abnormality. No abdominopelvic ascites. Musculoskeletal: No acute bone abnormality. No lytic or blastic bone lesion. IMPRESSION: 1. No acute intracranial abnormality. No calvarial fracture. 2. Moderate coronary artery calcification. 3. No acute intrathoracic or intra-abdominal pathology identified. 4. Stable 9 mm right adrenal adenoma. Stable changes of adrenal hyperplasia. 5. Marked distention of the bladder possibly reflecting changes of bladder outlet obstruction. 6. Mild prostatic hypertrophy.  Probable post TURP changes. Aortic Atherosclerosis (ICD10-I70.0). Electronically Signed   By: Helyn Numbers M.D.   On: 10/17/2022 03:13   DG Chest Port 1 View  Result Date: 10/17/2022 CLINICAL DATA:  Sepsis, found down EXAM: PORTABLE CHEST 1 VIEW COMPARISON:  12/11/2021 FINDINGS: The heart size and mediastinal contours are within normal limits. Both lungs are  clear. The visualized skeletal structures are unremarkable. IMPRESSION: No active disease. Electronically Signed   By: Helyn Numbers M.D.   On: 10/17/2022 00:15      Assessment/Plan Principal Problem:   SIRS (systemic inflammatory response syndrome) (HCC) Active Problems:   NSTEMI (non-ST elevated myocardial infarction) (HCC)   Fall   COPD (chronic obstructive pulmonary disease) (HCC)   Hypothyroidism   Hyponatremia   Rhabdomyolysis   BPH (benign prostatic hyperplasia)   Acute renal failure superimposed on stage 3a chronic kidney disease (HCC)   Acute retention of urine  Depression with anxiety   Schizoaffective disorder, bipolar type (HCC)   Tobacco abuse   Acute metabolic encephalopathy   Assessment and Plan:  SIRS (systemic inflammatory response syndrome) (HCC): pt meets criteria for severe SIRS with WBC 11.2, lactic acid > 9.0 --> 4.9, fever 100.1, heart rate up to 123, RR up to 26.  Patient has worsening renal function and altered mental status.  So far no source of infection identified, will treat patient as SIRS now.  If source of infection is identified, will change to severe sepsis with septic shock.  Patient has negative urinalysis, negative COVID PCR, negative chest x-ray. Neck is supple, no meningeal sign, low suspicions for meningitis.  -will admitted to PCU as inpatient -Broad antibiotics: Linezolid, cefepime, Flagyl (patient received 1 dose of vancomycin, due to worsening renal function, changed to linezolid). -Blood culture -IV fluid: 3 L LR, and 500 cc of NS, 75 cc/h of normal saline -Trend lactic acid level -Check procalcitonin level -check RVP    Addendum: pt's Bp dropped to 63/49 with MAP 56 in later afternoon -will give another 1L of NS - give 25 g of albumin, 100 mg of Solu-Cortef -Start midodrine 10 mg 3 times daily -If blood pressure does not improve, will transfer patient to stepdown bed, and start Levophed and consult PCCM.   NSTEMI (non-ST  elevated myocardial infarction) [facility]): Trop  172 --> 406 --> 804 --> 784. Consulted Dr. Welton Flakes of card -IV heparin is started ED - start ASA - start lipitor --> will hold off Lipitor due to severe rhabdomyolysis with CK level 39792 -check A1c, FLP, UDS   Fall: no acute injury open fracture on images -Fall precaution -PT/OT when able to -Check CK level  Addendum, rhabdomyolysis: CK level 39792. -IVF as above -Repeat CK level bid   COPD (chronic obstructive pulmonary disease) (HCC): Stable -As needed bronchodilators and Mucinex  Hypothyroidism -Synthroid  Hyponatremia: Sodium 123.  Differential diagnosis include poor oral intake and dehydration, SIADH, and second medication side effects - Will check urine sodium, urine osmolality, serum osmolality. - Fluid restriction - IVF: As above - Sodium chloride tablet 1 g twice daily - f/u by BMP q8h - avoid over correction too fast due to risk of central pontine myelinolysis  BPH (benign prostatic hyperplasia) and Acute retention of urine -placed Foley since patient has worsening renal function -continue Cardura -add Proscar  Acute renal failure superimposed on stage 3a chronic kidney disease (HCC): Baseline creatinine 1.17 on 06/04/2022.  His creatinine is 7.78, BUN 18, GFR 41 today. likely multifactorial etiology, including sepsis, dehydration, urinary retention secondary to bladder outlet obstruction, and rhabdomyolysis. -IV fluid as above -Avoid using renal toxic medications  Depression with anxiety and schizoaffective disorder, bipolar type [facility]): Patient is calm now. -Benztropine, BuSpar, clozapine, Depakote, Ingrezza  Tobacco abuse -Nicotine patch  Acute metabolic encephalopathy: This is due to multifactorial etiology.  CT head negative. -Frequent neurocheck -Fall precaution    DVT ppx: in IV Heparin         Code Status: Full code per his caregiver  Family Communication: Yes, patient's care giver by  phone  Disposition Plan:  Anticipate discharge back to previous environment, family care facility  Consults called:  Dr. Welton Flakes of card  Admission status and Level of care: Progressive:    as inpt         Dispo: The patient is from:  Family care facility  Anticipated d/c is to:  Family care facility              Anticipated d/c date is: 2 days              Patient currently is not medically stable to d/c.    Severity of Illness:  The appropriate patient status for this patient is INPATIENT. Inpatient status is judged to be reasonable and necessary in order to provide the required intensity of service to ensure the patient's safety. The patient's presenting symptoms, physical exam findings, and initial radiographic and laboratory data in the context of their chronic comorbidities is felt to place them at high risk for further clinical deterioration. Furthermore, it is not anticipated that the patient will be medically stable for discharge from the hospital within 2 midnights of admission.   * I certify that at the point of admission it is my clinical judgment that the patient will require inpatient hospital care spanning beyond 2 midnights from the point of admission due to high intensity of service, high risk for further deterioration and high frequency of surveillance required.*       Date of Service 10/17/2022    Lorretta Harp [facility]   If 7PM-7AM, please contact night-coverage [email or web address] 10/17/2022, 2:09 PM

## 2022-10-17 NOTE — Progress Notes (Signed)
Patient refused ECHO.   Dondra Prader RVT RCS

## 2022-10-17 NOTE — Consult Note (Signed)
Austin Marsh is a 67 y.o. male  [id number]  Primary Cardiologist: [facility] cardiology Reason for Consultation: Elevated troponin and chest pain  HPI: This is a 67 year old white male with a past medical history of schizophrenia and COPD presented to the hospital after a fall from nursing home.  Patient states he is hurting in the back as well as in the chest and is short of breath.   Review of Systems: No orthopnea PND or leg swelling   Past Medical History:  Diagnosis Date   COPD (chronic obstructive pulmonary disease) (HCC)    Schizoaffective disorder, bipolar type (HCC)    Sleep apnea    Tardive dyskinesia    Thyroid disease     Medications Prior to Admission  Medication Sig Dispense Refill   acetaminophen (TYLENOL) 325 MG tablet Take 650 mg by mouth every 4 (four) hours as needed.     albuterol (VENTOLIN HFA) 108 (90 Base) MCG/ACT inhaler Inhale 2 puffs into the lungs every 4 (four) hours as needed for wheezing or shortness of breath. 8 g 0   alum & mag hydroxide-simeth (MAALOX/MYLANTA) 200-200-20 MG/5ML suspension Take 30 mLs by mouth daily as needed for indigestion or heartburn.     benztropine (COGENTIN) 2 MG tablet Take 2 mg by mouth 2 (two) times daily.     bismuth subsalicylate (PEPTO BISMOL) 262 MG/15ML suspension Take 15 mLs by mouth every 4 (four) hours as needed.     busPIRone (BUSPAR) 15 MG tablet Take 15 mg by mouth 2 (two) times daily.     cloZAPine (CLOZARIL) 100 MG tablet Take 100-200 mg by mouth 2 (two) times daily. 100 mg qam 200 mg qpm     divalproex (DEPAKOTE ER) 500 MG 24 hr tablet Take 1,000 mg by mouth at bedtime.     doxazosin (CARDURA) 4 MG tablet Take 4 mg by mouth daily.     gabapentin (NEURONTIN) 300 MG capsule Take 300 mg by mouth 3 (three) times daily.     glycopyrrolate (ROBINUL) 1 MG tablet Take 1 mg by mouth daily.     guaiFENesin (ROBITUSSIN) 100 MG/5ML liquid Take 200 mg by mouth every 6 (six) hours as needed for cough.     levothyroxine  (SYNTHROID) 100 MCG tablet Take 75 mcg by mouth daily before breakfast.     loperamide (IMODIUM) 2 MG capsule Take 2 mg by mouth as needed for diarrhea or loose stools.     magnesium hydroxide (MILK OF MAGNESIA) 400 MG/5ML suspension Take 30 mLs by mouth daily as needed for mild constipation.     meclizine (ANTIVERT) 25 MG tablet Take 25 mg by mouth 3 (three) times daily as needed for dizziness.     pantoprazole (PROTONIX) 40 MG tablet Take 40 mg by mouth daily.     sennosides-docusate sodium (SENOKOT-S) 8.6-50 MG tablet Take 1 tablet by mouth in the morning and at bedtime.     traZODone (DESYREL) 50 MG tablet Take 50 mg by mouth at bedtime.     valbenazine (INGREZZA) 80 MG capsule Take 80 mg by mouth daily.     vitamin B-12 (CYANOCOBALAMIN) 100 MCG tablet Take 100 mcg by mouth daily.     Vitamin D, Cholecalciferol, 25 MCG (1000 UT) TABS Take 50 mcg by mouth daily.      cephALEXin (KEFLEX) 500 MG capsule Take 1 capsule (500 mg total) by mouth 2 (two) times daily. (Patient not taking: Reported on 10/17/2022) 14 capsule 0   polyethylene glycol (MIRALAX) 17  g packet Take 17 g by mouth daily. (Patient not taking: Reported on 10/17/2022) 14 each 0      [START ON 10/18/2022] aspirin EC  81 mg Oral Daily   atorvastatin  40 mg Oral Daily   benztropine  2 mg Oral BID   busPIRone  15 mg Oral BID   cholecalciferol  2,000 Units Oral Daily   cloZAPine  100 mg Oral Daily   And   cloZAPine  200 mg Oral QHS   divalproex  1,000 mg Oral QHS   doxazosin  4 mg Oral Daily   gabapentin  300 mg Oral TID   glycopyrrolate  1 mg Oral Daily   levothyroxine  75 mcg Oral Q0600   nicotine  21 mg Transdermal Daily   pantoprazole  40 mg Oral Daily   senna-docusate  1 tablet Oral BID   traZODone  50 mg Oral QHS   valbenazine  80 mg Oral Daily   vitamin B-12  100 mcg Oral Daily    Infusions:  sodium chloride 75 mL/hr at 10/17/22 0811   ceFEPime (MAXIPIME) IV     heparin 1,200 Units/hr (10/17/22 0501)   linezolid  (ZYVOX) IV 600 mg (10/17/22 1021)   metronidazole      Allergies  Allergen Reactions   Haldol [Haloperidol Lactate] Other (See Comments)    Tardive diskonesia   Other     [employer] cause patient to vomit   Thorazine [Chlorpromazine] Other (See Comments)    "messes me all up"   Trazodone And Nefazodone Other (See Comments)    "makes me feel like I'm dying."    Social History   Socioeconomic History   Marital status: Married    Spouse name: Not on file   Number of children: Not on file   Years of education: Not on file   Highest education level: Not on file  Occupational History   Not on file  Tobacco Use   Smoking status: Every Day    Types: Cigarettes   Smokeless tobacco: Never  Substance and Sexual Activity   Alcohol use: Not Currently   Drug use: Not Currently   Sexual activity: Not Currently  Other Topics Concern   Not on file  Social History Narrative   Not on file   Social Determinants of Health   Financial Resource Strain: Not on file  Food Insecurity: No Food Insecurity (10/17/2022)   Hunger Vital Sign    Worried About Running Out of Food in the Last Year: Never true    Ran Out of Food in the Last Year: Never true  Transportation Needs: Not on file  Physical Activity: Not on file  Stress: Not on file  Social Connections: Not on file  Intimate Partner Violence: Not on file    History reviewed. No pertinent family history.  PHYSICAL EXAM: Vitals:   10/17/22 0600 10/17/22 0959  BP:  118/66  Pulse:  63  Resp: 14   Temp:  (!) 97.4 F (36.3 C)  SpO2:  98%     Intake/Output Summary (Last 24 hours) at 10/17/2022 1119 Last data filed at 10/17/2022 1100 Gross per 24 hour  Intake 1733.5 ml  Output 1300 ml  Net 433.5 ml    General:  Well appearing. No respiratory difficulty HEENT: normal Neck: supple. no JVD. Carotids 2+ bilat; no bruits. No lymphadenopathy or thryomegaly appreciated. Cor: PMI nondisplaced. Regular rate & rhythm. No rubs, gallops  or murmurs. Lungs: clear Abdomen: soft, nontender, nondistended. No hepatosplenomegaly. No  bruits or masses. Good bowel sounds. Extremities: no cyanosis, clubbing, rash, edema Neuro: alert & oriented x 3, cranial nerves grossly intact. moves all 4 extremities w/o difficulty. Affect pleasant.  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 and ST depression inferolaterally  Results for orders placed or performed during the hospital encounter of 10/16/22 (from the past 24 hour(s))  Lactic acid, plasma     Status: Abnormal   Collection Time: 10/16/22 11:44 PM  Result Value Ref Range   Lactic Acid, Venous >9.0 (HH) 0.5 - 1.9 mmol/L  Comprehensive metabolic panel     Status: Abnormal   Collection Time: 10/16/22 11:44 PM  Result Value Ref Range   Sodium 123 (L) 135 - 145 mmol/L   Potassium 4.4 3.5 - 5.1 mmol/L   Chloride 92 (L) 98 - 111 mmol/L   CO2 11 (L) 22 - 32 mmol/L   Glucose, Bld 159 (H) 70 - 99 mg/dL   BUN 18 8 - 23 mg/dL   Creatinine, Ser 1.61 (H) 0.61 - 1.24 mg/dL   Calcium 8.1 (L) 8.9 - 10.3 mg/dL   Total Protein 6.0 (L) 6.5 - 8.1 g/dL   Albumin 3.3 (L) 3.5 - 5.0 g/dL   AST 30 15 - 41 U/L   ALT 10 0 - 44 U/L   Alkaline Phosphatase 44 38 - 126 U/L   Total Bilirubin 0.6 0.3 - 1.2 mg/dL   GFR, Estimated 41 (L) >60 mL/min   Anion gap 20 (H) 5 - 15  CBC with Differential     Status: Abnormal   Collection Time: 10/16/22 11:44 PM  Result Value Ref Range   WBC 11.2 (H) 4.0 - 10.5 K/uL   RBC 3.55 (L) 4.22 - 5.81 MIL/uL   Hemoglobin 11.5 (L) 13.0 - 17.0 g/dL   HCT 09.6 (L) 04.5 - 40.9 %   MCV 96.6 80.0 - 100.0 fL   MCH 32.4 26.0 - 34.0 pg   MCHC 33.5 30.0 - 36.0 g/dL   RDW 81.1 91.4 - 78.2 %   Platelets 165 150 - 400 K/uL   nRBC 0.0 0.0 - 0.2 %   Neutrophils Relative % 62 %   Neutro Abs 6.8 1.7 - 7.7 K/uL   Lymphocytes Relative 24 %   Lymphs Abs 2.7 0.7 - 4.0 K/uL   Monocytes Relative 12 %   Monocytes Absolute 1.3 (H) 0.1 - 1.0 K/uL   Eosinophils Relative 0 %    Eosinophils Absolute 0.0 0.0 - 0.5 K/uL   Basophils Relative 0 %   Basophils Absolute 0.0 0.0 - 0.1 K/uL   Immature Granulocytes 2 %   Abs Immature Granulocytes 0.21 (H) 0.00 - 0.07 K/uL  Protime-INR     Status: Abnormal   Collection Time: 10/16/22 11:44 PM  Result Value Ref Range   Prothrombin Time 15.7 (H) 11.4 - 15.2 seconds   INR 1.2 0.8 - 1.2  Blood Culture (routine x 2)     Status: None (Preliminary result)   Collection Time: 10/16/22 11:44 PM   Specimen: BLOOD LEFT ARM  Result Value Ref Range   Specimen Description BLOOD LEFT ARM    Special Requests      BOTTLES DRAWN AEROBIC AND ANAEROBIC Blood Culture adequate volume   Culture      NO GROWTH < 12 HOURS Performed at [facility], [address] [place], [place] [address]    Report Status PENDING   Blood Culture (routine x 2)     Status: None (Preliminary result)  Collection Time: 10/16/22 11:44 PM   Specimen: BLOOD RIGHT ARM  Result Value Ref Range   Specimen Description BLOOD RIGHT ARM    Special Requests      BOTTLES DRAWN AEROBIC AND ANAEROBIC Blood Culture adequate volume   Culture      NO GROWTH < 12 HOURS Performed at [facility], [address] [place], [place] [address]    Report Status PENDING   Lipase, blood     Status: None   Collection Time: 10/16/22 11:44 PM  Result Value Ref Range   Lipase 38 11 - 51 U/L  Blood gas, venous     Status: Abnormal   Collection Time: 10/16/22 11:44 PM  Result Value Ref Range   pH, Ven 7.3 7.25 - 7.43   pCO2, Ven 26 (L) 44 - 60 mmHg   pO2, Ven 90 (H) 32 - 45 mmHg   Bicarbonate 12.8 (L) 20.0 - 28.0 mmol/L   Acid-base deficit 11.9 (H) 0.0 - 2.0 mmol/L   O2 Saturation 98.5 %   Patient temperature 37.0    Collection site VEIN   Urinalysis, w/ Reflex to Culture (Infection Suspected) -Urine, Catheterized     Status: Abnormal   Collection Time: 10/16/22 11:44 PM  Result Value Ref Range   Specimen Source URINE, CATHETERIZED    Color, Urine YELLOW  (A) YELLOW   APPearance CLEAR (A) CLEAR   Specific Gravity, Urine 1.008 1.005 - 1.030   pH 6.0 5.0 - 8.0   Glucose, UA NEGATIVE NEGATIVE mg/dL   Hgb urine dipstick NEGATIVE NEGATIVE   Bilirubin Urine NEGATIVE NEGATIVE   Ketones, ur NEGATIVE NEGATIVE mg/dL   Protein, ur NEGATIVE NEGATIVE mg/dL   Nitrite NEGATIVE NEGATIVE   Leukocytes,Ua NEGATIVE NEGATIVE   RBC / HPF 0-5 0 - 5 RBC/hpf   WBC, UA NONE SEEN 0 - 5 WBC/hpf   Bacteria, UA NONE SEEN NONE SEEN   Squamous Epithelial / HPF NONE SEEN 0 - 5 /HPF  Troponin I (High Sensitivity)     Status: Abnormal   Collection Time: 10/16/22 11:44 PM  Result Value Ref Range   Troponin I (High Sensitivity) 172 (HH) <18 ng/L  SARS Coronavirus 2 by RT PCR (hospital order, performed in [facility] hospital lab) *cepheid single result test* Anterior Nasal Swab     Status: None   Collection Time: 10/16/22 11:44 PM   Specimen: Anterior Nasal Swab  Result Value Ref Range   SARS Coronavirus 2 by RT PCR NEGATIVE NEGATIVE  Urine Drug Screen, Qualitative ([facility] only)     Status: None   Collection Time: 10/17/22 12:15 AM  Result Value Ref Range   Tricyclic, Ur Screen NONE DETECTED NONE DETECTED   Amphetamines, Ur Screen NONE DETECTED NONE DETECTED   MDMA (Ecstasy)Ur Screen NONE DETECTED NONE DETECTED   Cocaine Metabolite,Ur [place] NONE DETECTED NONE DETECTED   Opiate, Ur Screen NONE DETECTED NONE DETECTED   Phencyclidine (PCP) Ur S NONE DETECTED NONE DETECTED   Cannabinoid 50 Ng, Ur  NONE DETECTED NONE DETECTED   Barbiturates, Ur Screen NONE DETECTED NONE DETECTED   Benzodiazepine, Ur Scrn NONE DETECTED NONE DETECTED   Methadone Scn, Ur NONE DETECTED NONE DETECTED  Osmolality, urine     Status: Abnormal   Collection Time: 10/17/22 12:15 AM  Result Value Ref Range   Osmolality, Ur 272 (L) 300 - 900 mOsm/kg  Sodium, urine, random     Status: None   Collection Time: 10/17/22 12:15 AM  Result Value Ref Range  Sodium, Ur 28 mmol/L  Troponin I (High  Sensitivity)     Status: Abnormal   Collection Time: 10/17/22  1:14 AM  Result Value Ref Range   Troponin I (High Sensitivity) 406 (HH) <18 ng/L  Lactic acid, plasma     Status: Abnormal   Collection Time: 10/17/22  1:32 AM  Result Value Ref Range   Lactic Acid, Venous 4.9 (HH) 0.5 - 1.9 mmol/L  APTT     Status: None   Collection Time: 10/17/22  3:42 AM  Result Value Ref Range   aPTT 26 24 - 36 seconds  Magnesium     Status: None   Collection Time: 10/17/22  5:00 AM  Result Value Ref Range   Magnesium 2.3 1.7 - 2.4 mg/dL  Phosphorus     Status: None   Collection Time: 10/17/22  5:00 AM  Result Value Ref Range   Phosphorus 3.5 2.5 - 4.6 mg/dL  Troponin I (High Sensitivity)     Status: Abnormal   Collection Time: 10/17/22  5:34 AM  Result Value Ref Range   Troponin I (High Sensitivity) 804 (HH) <18 ng/L  Troponin I (High Sensitivity)     Status: Abnormal   Collection Time: 10/17/22  7:25 AM  Result Value Ref Range   Troponin I (High Sensitivity) 784 (HH) <18 ng/L  Lipid panel     Status: None   Collection Time: 10/17/22  7:25 AM  Result Value Ref Range   Cholesterol 111 0 - 200 mg/dL   Triglycerides 28 <098 mg/dL   HDL 46 >11 mg/dL   Total CHOL/HDL Ratio 2.4 RATIO   VLDL 6 0 - 40 mg/dL   LDL Cholesterol 59 0 - 99 mg/dL  Basic metabolic panel     Status: Abnormal   Collection Time: 10/17/22  7:25 AM  Result Value Ref Range   Sodium 125 (L) 135 - 145 mmol/L   Potassium 4.7 3.5 - 5.1 mmol/L   Chloride 99 98 - 111 mmol/L   CO2 19 (L) 22 - 32 mmol/L   Glucose, Bld 101 (H) 70 - 99 mg/dL   BUN 18 8 - 23 mg/dL   Creatinine, Ser 9.14 (H) 0.61 - 1.24 mg/dL   Calcium 7.7 (L) 8.9 - 10.3 mg/dL   GFR, Estimated 47 (L) >60 mL/min   Anion gap 7 5 - 15  Osmolality     Status: Abnormal   Collection Time: 10/17/22  7:25 AM  Result Value Ref Range   Osmolality 272 (L) 275 - 295 mOsm/kg  Lactic acid, plasma     Status: Abnormal   Collection Time: 10/17/22  8:53 AM  Result Value Ref  Range   Lactic Acid, Venous 2.7 (HH) 0.5 - 1.9 mmol/L   CT Head Wo Contrast  Result Date: 10/17/2022 CLINICAL DATA:  Mental status change, unknown cause; Sepsis EXAM: CT HEAD WITHOUT CONTRAST CT CHEST, ABDOMEN AND PELVIS WITH CONTRAST TECHNIQUE: Contiguous axial images were obtained from the base of the skull through the vertex without intravenous contrast. Multidetector CT imaging of the chest, abdomen and pelvis was performed following the standard protocol during bolus administration of intravenous contrast. RADIATION DOSE REDUCTION: This exam was performed according to the departmental dose-optimization program which includes automated exposure control, adjustment of the mA and/or kV according to patient size and/or use of iterative reconstruction technique. CONTRAST:  80mL OMNIPAQUE IOHEXOL 300 MG/ML  SOLN COMPARISON:  CT head 05/09/2020, CT chest abdomen pelvis 09/08/2020 FINDINGS: CT HEAD FINDINGS Brain: Normal anatomic configuration.  Parenchymal volume loss is commensurate with the patient's age. No abnormal intra or extra-axial mass lesion or fluid collection. No abnormal mass effect or midline shift. No evidence of acute intracranial hemorrhage or infarct. Ventricular size is normal. Cerebellum unremarkable. Vascular: No asymmetric hyperdense vasculature at the skull base. Skull: Intact Sinuses/Orbits: Paranasal sinuses are clear. Orbits are unremarkable. Other: Mastoid air cells and middle ear cavities are clear. CT CHEST FINDINGS Cardiovascular: Moderate coronary artery calcification within the left anterior descending coronary artery proximally. Global cardiac size within normal limits. No pericardial effusion. Central pulmonary arteries are of normal caliber. Mild atherosclerotic calcification within the thoracic aorta. No aortic aneurysm. Mediastinum/Nodes: No enlarged mediastinal, hilar, or axillary lymph nodes. Thyroid gland, trachea, and esophagus demonstrate no significant findings.  Lungs/Pleura: Stable architectural distortion within the upper lobes bilaterally in keeping with probable post infectious or post inflammatory scarring. No superimposed confluent pulmonary infiltrate. Bibasilar atelectasis. No pneumothorax or pleural effusion. No central obstructing lesion. Musculoskeletal: No acute bone abnormality. No lytic or blastic bone lesion. CT ABDOMEN PELVIS FINDINGS Hepatobiliary: Stable scattered probable cysts within the liver. No enhancing intrahepatic mass. Gallbladder unremarkable. No intra or extrahepatic biliary ductal dilation. Pancreas: Unremarkable Spleen: Unremarkable Adrenals/Urinary Tract: Stable thickening of the adrenal glands bilaterally in keeping with adrenal hyperplasia. Stable 9 mm low-attenuation nodule within the right adrenal gland in keeping with an adrenal adenoma. No follow-up imaging is recommended for this lesion. The kidneys are normal in size and position. Multiple simple cortical cysts are seen within the kidneys bilaterally for which no follow-up imaging is recommended. The kidneys are otherwise unremarkable. The bladder is distended with a punctate focus of intraluminal gas identified possibly related to recent catheterization. Stomach/Bowel: Appendectomy has been performed. The stomach, small bowel, and large bowel are unremarkable. No evidence of obstruction or focal inflammation. No free intraperitoneal gas or fluid. Vascular/Lymphatic: Aortic atherosclerosis. No enlarged abdominal or pelvic lymph nodes. Reproductive: Mild prostatic hypertrophy. Defect within the central prostate gland likely reflects changes of prior trans urethral resection. Other: No abdominal wall hernia or abnormality. No abdominopelvic ascites. Musculoskeletal: No acute bone abnormality. No lytic or blastic bone lesion. IMPRESSION: 1. No acute intracranial abnormality. No calvarial fracture. 2. Moderate coronary artery calcification. 3. No acute intrathoracic or intra-abdominal  pathology identified. 4. Stable 9 mm right adrenal adenoma. Stable changes of adrenal hyperplasia. 5. Marked distention of the bladder possibly reflecting changes of bladder outlet obstruction. 6. Mild prostatic hypertrophy.  Probable post TURP changes. Aortic Atherosclerosis (ICD10-I70.0). Electronically Signed   By: Helyn Numbers M.D.   On: 10/17/2022 03:13   CT CHEST ABDOMEN PELVIS W CONTRAST  Result Date: 10/17/2022 CLINICAL DATA:  Mental status change, unknown cause; Sepsis EXAM: CT HEAD WITHOUT CONTRAST CT CHEST, ABDOMEN AND PELVIS WITH CONTRAST TECHNIQUE: Contiguous axial images were obtained from the base of the skull through the vertex without intravenous contrast. Multidetector CT imaging of the chest, abdomen and pelvis was performed following the standard protocol during bolus administration of intravenous contrast. RADIATION DOSE REDUCTION: This exam was performed according to the departmental dose-optimization program which includes automated exposure control, adjustment of the mA and/or kV according to patient size and/or use of iterative reconstruction technique. CONTRAST:  80mL OMNIPAQUE IOHEXOL 300 MG/ML  SOLN COMPARISON:  CT head 05/09/2020, CT chest abdomen pelvis 09/08/2020 FINDINGS: CT HEAD FINDINGS Brain: Normal anatomic configuration. Parenchymal volume loss is commensurate with the patient's age. No abnormal intra or extra-axial mass lesion or fluid collection. No abnormal mass effect or midline shift. No  evidence of acute intracranial hemorrhage or infarct. Ventricular size is normal. Cerebellum unremarkable. Vascular: No asymmetric hyperdense vasculature at the skull base. Skull: Intact Sinuses/Orbits: Paranasal sinuses are clear. Orbits are unremarkable. Other: Mastoid air cells and middle ear cavities are clear. CT CHEST FINDINGS Cardiovascular: Moderate coronary artery calcification within the left anterior descending coronary artery proximally. Global cardiac size within normal  limits. No pericardial effusion. Central pulmonary arteries are of normal caliber. Mild atherosclerotic calcification within the thoracic aorta. No aortic aneurysm. Mediastinum/Nodes: No enlarged mediastinal, hilar, or axillary lymph nodes. Thyroid gland, trachea, and esophagus demonstrate no significant findings. Lungs/Pleura: Stable architectural distortion within the upper lobes bilaterally in keeping with probable post infectious or post inflammatory scarring. No superimposed confluent pulmonary infiltrate. Bibasilar atelectasis. No pneumothorax or pleural effusion. No central obstructing lesion. Musculoskeletal: No acute bone abnormality. No lytic or blastic bone lesion. CT ABDOMEN PELVIS FINDINGS Hepatobiliary: Stable scattered probable cysts within the liver. No enhancing intrahepatic mass. Gallbladder unremarkable. No intra or extrahepatic biliary ductal dilation. Pancreas: Unremarkable Spleen: Unremarkable Adrenals/Urinary Tract: Stable thickening of the adrenal glands bilaterally in keeping with adrenal hyperplasia. Stable 9 mm low-attenuation nodule within the right adrenal gland in keeping with an adrenal adenoma. No follow-up imaging is recommended for this lesion. The kidneys are normal in size and position. Multiple simple cortical cysts are seen within the kidneys bilaterally for which no follow-up imaging is recommended. The kidneys are otherwise unremarkable. The bladder is distended with a punctate focus of intraluminal gas identified possibly related to recent catheterization. Stomach/Bowel: Appendectomy has been performed. The stomach, small bowel, and large bowel are unremarkable. No evidence of obstruction or focal inflammation. No free intraperitoneal gas or fluid. Vascular/Lymphatic: Aortic atherosclerosis. No enlarged abdominal or pelvic lymph nodes. Reproductive: Mild prostatic hypertrophy. Defect within the central prostate gland likely reflects changes of prior trans urethral resection.  Other: No abdominal wall hernia or abnormality. No abdominopelvic ascites. Musculoskeletal: No acute bone abnormality. No lytic or blastic bone lesion. IMPRESSION: 1. No acute intracranial abnormality. No calvarial fracture. 2. Moderate coronary artery calcification. 3. No acute intrathoracic or intra-abdominal pathology identified. 4. Stable 9 mm right adrenal adenoma. Stable changes of adrenal hyperplasia. 5. Marked distention of the bladder possibly reflecting changes of bladder outlet obstruction. 6. Mild prostatic hypertrophy.  Probable post TURP changes. Aortic Atherosclerosis (ICD10-I70.0). Electronically Signed   By: Helyn Numbers M.D.   On: 10/17/2022 03:13   DG Chest Port 1 View  Result Date: 10/17/2022 CLINICAL DATA:  Sepsis, found down EXAM: PORTABLE CHEST 1 VIEW COMPARISON:  12/11/2021 FINDINGS: The heart size and mediastinal contours are within normal limits. Both lungs are clear. The visualized skeletal structures are unremarkable. IMPRESSION: No active disease. Electronically Signed   By: Helyn Numbers M.D.   On: 10/17/2022 00:15     ASSESSMENT AND PLAN: #1 elevated troponin with the last troponin 784 down from 804.  Patient does have some ST depression inferolaterally.  However patient had fever on admission with altered mental status and metabolic acidosis suggestive of sepsis.  Thus will hold off doing any intervention at this time.  Advise giving IV heparin and aspirin and high intensity statins. #2 chronic renal insufficiency with electrolyte abnormality including hyponatremia.  Sodium is 125 and BUN is 18, and creatinine 1.6.  Advise getting renal consult. #3 status post fall most likely due to sepsis.  Will hold off cardiac cath until is clear with the sepsis is present or not.  Lorrin Bodner [place] Flakes

## 2022-10-17 NOTE — Progress Notes (Signed)
ANTICOAGULATION CONSULT NOTE  Pharmacy Consult for heparin infusion Indication: ACS/STEMI  Allergies  Allergen Reactions   Haldol [Haloperidol Lactate] Other (See Comments)    Tardive diskonesia   Other     Navy Beans cause patient to vomit   Thorazine [Chlorpromazine] Other (See Comments)    "messes me all up"   Trazodone And Nefazodone Other (See Comments)    "makes me feel like I'm dying."    Patient Measurements: Height: 5\' 9"  (175.3 cm) Weight: 87.9 kg (193 lb 12.6 oz) IBW/kg (Calculated) : 70.7 Heparin Dosing Weight: 89.1 kg  Vital Signs: Temp: 98 F (36.7 C) (06/30 2005) Temp Source: Axillary (06/30 2005) BP: 98/59 (06/30 2005) Pulse Rate: 63 (06/30 2005)  Labs: Recent Labs    10/16/22 2344 10/17/22 0114 10/17/22 0342 10/17/22 0534 10/17/22 0725 10/17/22 0938 10/17/22 1138 10/17/22 1526 10/17/22 1647 10/17/22 2005  HGB 11.5*  --   --   --   --   --   --   --   --   --   HCT 34.3*  --   --   --   --   --   --   --   --   --   PLT 165  --   --   --   --   --   --   --   --   --   APTT  --   --  26  --   --   --   --   --   --   --   LABPROT 15.7*  --   --   --   --   --   --   --   --   --   INR 1.2  --   --   --   --   --   --   --   --   --   HEPARINUNFRC  --   --   --   --   --   --  >1.10*  --   --  0.82*  CREATININE 1.78*  --   --   --  1.60*  --   --  1.96*  --   --   CKTOTAL  --   --   --   --   --  16,109*  --   --  >50,000*  --   TROPONINIHS 172* 406*  --  804* 784*  --   --   --   --   --      Estimated Creatinine Clearance: 39.6 mL/min (A) (by C-G formula based on SCr of 1.96 mg/dL (H)).   Medical History: Past Medical History:  Diagnosis Date   COPD (chronic obstructive pulmonary disease) (HCC)    Schizoaffective disorder, bipolar type (HCC)    Sleep apnea    Tardive dyskinesia    Thyroid disease     Assessment: Pt is a 67 yo male presenting to ED from care facility after a fall, found with elevated troponin I level, trending  up.  6/30 1138 HL > 1.1  6/30 2005 HL = 0.82  Goal of Therapy:  Heparin level 0.3-0.7 units/ml Monitor platelets by anticoagulation protocol: Yes   Plan:  Heparin level is supratherapeutic. Will decrease heparin infusion to 750 units/hr. Recheck heparin level in 6 hours CBC daily while on heparin.    Clovia Cuff, PharmD, BCPS 10/17/2022 8:47 PM

## 2022-10-17 NOTE — Progress Notes (Signed)
PHARMACY - PHYSICIAN COMMUNICATION CRITICAL VALUE ALERT - BLOOD CULTURE IDENTIFICATION (BCID)  Results for orders placed or performed during the hospital encounter of 10/16/22  Blood Culture (routine x 2)     Status: None (Preliminary result)   Collection Time: 10/16/22 11:44 PM   Specimen: BLOOD LEFT ARM  Result Value Ref Range Status   Specimen Description BLOOD LEFT ARM  Final   Special Requests   Final    BOTTLES DRAWN AEROBIC AND ANAEROBIC Blood Culture adequate volume   Culture   Final    NO GROWTH < 12 HOURS Performed at [facility], [address] [place], [place] [address]    Report Status PENDING  Incomplete  Blood Culture (routine x 2)     Status: None (Preliminary result)   Collection Time: 10/16/22 11:44 PM   Specimen: BLOOD RIGHT ARM  Result Value Ref Range Status   Specimen Description BLOOD RIGHT ARM  Final   Special Requests   Final    BOTTLES DRAWN AEROBIC AND ANAEROBIC Blood Culture adequate volume   Culture  Setup Time   Final    Organism ID to follow GRAM POSITIVE COCCI ANAEROBIC BOTTLE ONLY Performed at [facility], [address], [place], [place] [address]    Culture [facility] POSITIVE COCCI  Final   Report Status PENDING  Incomplete  SARS Coronavirus 2 by RT PCR (hospital order, performed in [facility] hospital lab) *cepheid single result test* Anterior Nasal Swab     Status: None   Collection Time: 10/16/22 11:44 PM   Specimen: Anterior Nasal Swab  Result Value Ref Range Status   SARS Coronavirus 2 by RT PCR NEGATIVE NEGATIVE Final    Comment: (NOTE) SARS-CoV-2 target nucleic acids are NOT DETECTED.  The SARS-CoV-2 RNA is generally detectable in upper and lower respiratory specimens during the acute phase of infection. The lowest concentration of SARS-CoV-2 viral copies this assay can detect is 250 copies / mL. A negative result does not preclude SARS-CoV-2 infection and should not be used as the sole basis for treatment or  other patient management decisions.  A negative result may occur with improper specimen collection / handling, submission of specimen other than nasopharyngeal swab, presence of viral mutation(s) within the areas targeted by this assay, and inadequate number of viral copies (<250 copies / mL). A negative result must be combined with clinical observations, patient history, and epidemiological information.  Fact Sheet for Patients:   RoadLapTop.co.za  Fact Sheet for Healthcare Providers: http://kim-miller.com/  This test is not yet approved or  cleared by the [place] FDA and has been authorized for detection and/or diagnosis of SARS-CoV-2 by FDA under an Emergency Use Authorization (EUA).  This EUA will remain in effect (meaning this test can be used) for the duration of the COVID-19 declaration under Section 564(b)(1) of the Act, 21 U.S.C. section 360bbb-3(b)(1), unless the authorization is terminated or revoked sooner.  Performed at [facility], [address] [place], [place] [address]   Blood Culture ID Panel (Reflexed)     Status: Abnormal   Collection Time: 10/16/22 11:44 PM  Result Value Ref Range Status   Enterococcus faecalis NOT DETECTED NOT DETECTED Final   Enterococcus Faecium NOT DETECTED NOT DETECTED Final   Listeria monocytogenes NOT DETECTED NOT DETECTED Final   Staphylococcus species DETECTED (A) NOT DETECTED Final    Comment: CRITICAL RESULT CALLED TO, READ BACK BY AND VERIFIED WITH: Dontrez Pettis @ 2253 10/17/22 [facility]    Staphylococcus  aureus (BCID) NOT DETECTED NOT DETECTED Final   Staphylococcus epidermidis DETECTED (A) NOT DETECTED Final    Comment: Methicillin (oxacillin) resistant coagulase negative staphylococcus. Possible blood culture contaminant (unless isolated from more than one blood culture draw or clinical case suggests pathogenicity). No antibiotic treatment is indicated for blood  culture  contaminants. CRITICAL RESULT CALLED TO, READ BACK BY AND VERIFIED WITH: Montrell Cessna @ 2253 10/17/22 [facility]    Staphylococcus lugdunensis NOT DETECTED NOT DETECTED Final   Streptococcus species NOT DETECTED NOT DETECTED Final   Streptococcus agalactiae NOT DETECTED NOT DETECTED Final   Streptococcus pneumoniae NOT DETECTED NOT DETECTED Final   Streptococcus pyogenes NOT DETECTED NOT DETECTED Final   A.calcoaceticus-baumannii NOT DETECTED NOT DETECTED Final   Bacteroides fragilis NOT DETECTED NOT DETECTED Final   Enterobacterales NOT DETECTED NOT DETECTED Final   Enterobacter cloacae complex NOT DETECTED NOT DETECTED Final   Escherichia coli NOT DETECTED NOT DETECTED Final   Klebsiella aerogenes NOT DETECTED NOT DETECTED Final   Klebsiella oxytoca NOT DETECTED NOT DETECTED Final   Klebsiella pneumoniae NOT DETECTED NOT DETECTED Final   Proteus species NOT DETECTED NOT DETECTED Final   Salmonella species NOT DETECTED NOT DETECTED Final   Serratia marcescens NOT DETECTED NOT DETECTED Final   Haemophilus influenzae NOT DETECTED NOT DETECTED Final   Neisseria meningitidis NOT DETECTED NOT DETECTED Final   Pseudomonas aeruginosa NOT DETECTED NOT DETECTED Final   Stenotrophomonas maltophilia NOT DETECTED NOT DETECTED Final   Candida albicans NOT DETECTED NOT DETECTED Final   Candida auris NOT DETECTED NOT DETECTED Final   Candida glabrata NOT DETECTED NOT DETECTED Final   Candida krusei NOT DETECTED NOT DETECTED Final   Candida parapsilosis NOT DETECTED NOT DETECTED Final   Candida tropicalis NOT DETECTED NOT DETECTED Final   Cryptococcus neoformans/gattii NOT DETECTED NOT DETECTED Final   Methicillin resistance mecA/C DETECTED (A) NOT DETECTED Final    Comment: CRITICAL RESULT CALLED TO, READ BACK BY AND VERIFIED WITH: Kasidy Gianino @ 2253 10/17/22 [facility] Performed at [facility], [address] [place], [place] [address]   Respiratory (~20 pathogens) panel by PCR     Status: None    Collection Time: 10/17/22  9:26 AM   Specimen: Nasopharyngeal Swab; Respiratory  Result Value Ref Range Status   Adenovirus NOT DETECTED NOT DETECTED Final   Coronavirus 229E NOT DETECTED NOT DETECTED Final    Comment: (NOTE) The Coronavirus on the Respiratory Panel, DOES NOT test for the novel  Coronavirus (2019 nCoV)    Coronavirus HKU1 NOT DETECTED NOT DETECTED Final   Coronavirus NL63 NOT DETECTED NOT DETECTED Final   Coronavirus OC43 NOT DETECTED NOT DETECTED Final   Metapneumovirus NOT DETECTED NOT DETECTED Final   Rhinovirus / Enterovirus NOT DETECTED NOT DETECTED Final   Influenza A NOT DETECTED NOT DETECTED Final   Influenza B NOT DETECTED NOT DETECTED Final   Parainfluenza Virus 1 NOT DETECTED NOT DETECTED Final   Parainfluenza Virus 2 NOT DETECTED NOT DETECTED Final   Parainfluenza Virus 3 NOT DETECTED NOT DETECTED Final   Parainfluenza Virus 4 NOT DETECTED NOT DETECTED Final   Respiratory Syncytial Virus NOT DETECTED NOT DETECTED Final   Bordetella pertussis NOT DETECTED NOT DETECTED Final   Bordetella Parapertussis NOT DETECTED NOT DETECTED Final   Chlamydophila pneumoniae NOT DETECTED NOT DETECTED Final   Mycoplasma pneumoniae NOT DETECTED NOT DETECTED Final    Comment: Performed at [facility], [address], [place], [place] [address]    BCID  Results: 1 (anaerobic) bottle of 4 bottles w/ Staph Epi, mecA/C detected.  Pt currently on Cefepime, Linezolid, and Flagyl.  Name of provider contacted: Andrez Grime, MD  Changes to prescribed antibiotics required: Continue current abx at this time.  Otelia Sergeant, PharmD, MBA 10/17/2022 11:50 PM

## 2022-10-17 NOTE — Consult Note (Addendum)
PHARMACIST - PHYSICIAN ORDER COMMUNICATION  Waylynn Leinberger is a 67 y.o. year old male with a history of schizoaffective disorder on Clozapine PTA. Continuing this medication order as an inpatient requires that monitoring parameters per REMS requirements must be met.   Clozapine REMS Dispense Authorization was obtained, and will dispense inpatient.  RDA code [id number].  Verified Clozapine dose: 100mg  in AM, 200mg  in PM  Last ANC value and date reported on the Clozapine REMS website:  6/29: 6800 ANC(per uL) ANC monitoring frequency: weekly Next ANC reporting is due on (date) 10/23/22.  Oriya Kettering A Rosemary Mossbarger 10/17/2022, 10:02 AM

## 2022-10-18 ENCOUNTER — Inpatient Hospital Stay: Payer: Medicare Other

## 2022-10-18 ENCOUNTER — Inpatient Hospital Stay
Admit: 2022-10-18 | Discharge: 2022-10-18 | Disposition: A | Discharge status: Home / Self Care | Payer: Medicare Other | Attending: Internal Medicine | Admitting: Internal Medicine

## 2022-10-18 DIAGNOSIS — N1831 Chronic kidney disease, stage 3a: Secondary | ICD-10-CM

## 2022-10-18 DIAGNOSIS — R7989 Other specified abnormal findings of blood chemistry: Secondary | ICD-10-CM | POA: Diagnosis not present

## 2022-10-18 DIAGNOSIS — R6510 Systemic inflammatory response syndrome (SIRS) of non-infectious origin without acute organ dysfunction: Secondary | ICD-10-CM | POA: Diagnosis not present

## 2022-10-18 DIAGNOSIS — M6282 Rhabdomyolysis: Secondary | ICD-10-CM

## 2022-10-18 DIAGNOSIS — I214 Non-ST elevation (NSTEMI) myocardial infarction: Secondary | ICD-10-CM | POA: Diagnosis not present

## 2022-10-18 DIAGNOSIS — N179 Acute kidney failure, unspecified: Secondary | ICD-10-CM

## 2022-10-18 DIAGNOSIS — G9341 Metabolic encephalopathy: Secondary | ICD-10-CM | POA: Diagnosis not present

## 2022-10-18 DIAGNOSIS — R4182 Altered mental status, unspecified: Secondary | ICD-10-CM | POA: Diagnosis not present

## 2022-10-18 DIAGNOSIS — T148XXA Other injury of unspecified body region, initial encounter: Secondary | ICD-10-CM | POA: Diagnosis not present

## 2022-10-18 DIAGNOSIS — R079 Chest pain, unspecified: Secondary | ICD-10-CM | POA: Diagnosis not present

## 2022-10-18 DIAGNOSIS — N171 Acute kidney failure with acute cortical necrosis: Secondary | ICD-10-CM

## 2022-10-18 LAB — BASIC METABOLIC PANEL
Anion gap: 8 (ref 5–15)
Anion gap: 11 (ref 5–15)
BUN: 30 mg/dL — ABNORMAL HIGH (ref 8–23)
BUN: 33 mg/dL — ABNORMAL HIGH (ref 8–23)
CO2: 16 mmol/L — ABNORMAL LOW (ref 22–32)
CO2: 16 mmol/L — ABNORMAL LOW (ref 22–32)
Calcium: 7 mg/dL — ABNORMAL LOW (ref 8.9–10.3)
Calcium: 7 mg/dL — ABNORMAL LOW (ref 8.9–10.3)
Chloride: 100 mmol/L (ref 98–111)
Chloride: 100 mmol/L (ref 98–111)
Creatinine, Ser: 3.17 mg/dL — ABNORMAL HIGH (ref 0.61–1.24)
Creatinine, Ser: 3.9 mg/dL — ABNORMAL HIGH (ref 0.61–1.24)
GFR, Estimated: 21 mL/min — ABNORMAL LOW (ref >60)
GFR, Estimated: 16 mL/min — ABNORMAL LOW (ref >60)
Glucose, Bld: 177 mg/dL — ABNORMAL HIGH (ref 70–99)
Glucose, Bld: 117 mg/dL — ABNORMAL HIGH (ref 70–99)
Potassium: 4.8 mmol/L (ref 3.5–5.1)
Potassium: 4.4 mmol/L (ref 3.5–5.1)
Sodium: 124 mmol/L — ABNORMAL LOW (ref 135–145)
Sodium: 127 mmol/L — ABNORMAL LOW (ref 135–145)

## 2022-10-18 LAB — BLOOD GAS, ARTERIAL
Acid-base deficit: 9.6 mmol/L — ABNORMAL HIGH (ref 0.0–2.0)
Acid-base deficit: 11 mmol/L — ABNORMAL HIGH (ref 0.0–2.0)
Bicarbonate: 15.7 mmol/L — ABNORMAL LOW (ref 20.0–28.0)
FIO2: 100 %
O2 Content: 60 L/min
O2 Saturation: 95.4 %
Patient temperature: 37
pCO2 arterial: 32 mmHg (ref 32–48)
pH, Arterial: 7.3 — ABNORMAL LOW (ref 7.35–7.45)
pO2, Arterial: 63 mmHg — ABNORMAL LOW (ref 83–108)

## 2022-10-18 LAB — CBC WITH DIFFERENTIAL/PLATELET
Abs Immature Granulocytes: 0.06 10*3/uL (ref 0.00–0.07)
Abs Immature Granulocytes: 0.11 10*3/uL — ABNORMAL HIGH (ref 0.00–0.07)
Basophils Absolute: 0 10*3/uL (ref 0.0–0.1)
Basophils Absolute: 0 10*3/uL (ref 0.0–0.1)
Basophils Relative: 0 %
Basophils Relative: 0 %
Eosinophils Absolute: 0 10*3/uL (ref 0.0–0.5)
Eosinophils Absolute: 0 10*3/uL (ref 0.0–0.5)
Eosinophils Relative: 0 %
Eosinophils Relative: 0 %
HCT: 16.3 % — ABNORMAL LOW (ref 39.0–52.0)
HCT: 28.4 % — ABNORMAL LOW (ref 39.0–52.0)
Hemoglobin: 5.4 g/dL — ABNORMAL LOW (ref 13.0–17.0)
Hemoglobin: 10.1 g/dL — ABNORMAL LOW (ref 13.0–17.0)
Immature Granulocytes: 1 %
Immature Granulocytes: 1 %
Lymphocytes Relative: 10 %
Lymphocytes Relative: 12 %
Lymphs Abs: 1 10*3/uL (ref 0.7–4.0)
Lymphs Abs: 1.2 10*3/uL (ref 0.7–4.0)
MCH: 32.3 pg (ref 26.0–34.0)
MCH: 31.2 pg (ref 26.0–34.0)
MCHC: 33.1 g/dL (ref 30.0–36.0)
MCHC: 35.6 g/dL (ref 30.0–36.0)
MCV: 97.6 fL (ref 80.0–100.0)
MCV: 87.7 fL (ref 80.0–100.0)
Monocytes Absolute: 1.4 10*3/uL — ABNORMAL HIGH (ref 0.1–1.0)
Monocytes Absolute: 1.5 10*3/uL — ABNORMAL HIGH (ref 0.1–1.0)
Monocytes Relative: 14 %
Monocytes Relative: 15 %
Neutro Abs: 8.1 10*3/uL — ABNORMAL HIGH (ref 1.7–7.7)
Neutro Abs: 7.4 10*3/uL (ref 1.7–7.7)
Neutrophils Relative %: 75 %
Neutrophils Relative %: 72 %
Platelets: 92 10*3/uL — ABNORMAL LOW (ref 150–400)
Platelets: 83 10*3/uL — ABNORMAL LOW (ref 150–400)
RBC: 1.67 MIL/uL — ABNORMAL LOW (ref 4.22–5.81)
RBC: 3.24 MIL/uL — ABNORMAL LOW (ref 4.22–5.81)
RDW: 14.2 % (ref 11.5–15.5)
RDW: 15.4 % (ref 11.5–15.5)
Smear Review: NORMAL
Smear Review: NORMAL
WBC: 10.6 10*3/uL — ABNORMAL HIGH (ref 4.0–10.5)
WBC: 10.3 10*3/uL (ref 4.0–10.5)
nRBC: 0 % (ref 0.0–0.2)
nRBC: 0 % (ref 0.0–0.2)

## 2022-10-18 LAB — BPAM RBC
Blood Product Expiration Date: 202408052359
Blood Product Expiration Date: 202408052359

## 2022-10-18 LAB — PROTIME-INR
INR: 1.4 — ABNORMAL HIGH (ref 0.8–1.2)
INR: 1.3 — ABNORMAL HIGH (ref 0.8–1.2)
Prothrombin Time: 17.7 seconds — ABNORMAL HIGH (ref 11.4–15.2)
Prothrombin Time: 16.3 seconds — ABNORMAL HIGH (ref 11.4–15.2)

## 2022-10-18 LAB — ECHOCARDIOGRAM COMPLETE
Est EF: >75
S' Lateral: 2 cm
Weight: 3100.55 oz

## 2022-10-18 LAB — PHOSPHORUS: Phosphorus: 5.4 mg/dL — ABNORMAL HIGH (ref 2.5–4.6)

## 2022-10-18 LAB — CBC
HCT: 18.5 % — ABNORMAL LOW (ref 39.0–52.0)
Hemoglobin: 6.3 g/dL — ABNORMAL LOW (ref 13.0–17.0)
MCH: 32.6 pg (ref 26.0–34.0)
MCHC: 34.1 g/dL (ref 30.0–36.0)
MCV: 95.9 fL (ref 80.0–100.0)
Platelets: 108 10*3/uL — ABNORMAL LOW (ref 150–400)
RBC: 1.93 MIL/uL — ABNORMAL LOW (ref 4.22–5.81)
RDW: 14.1 % (ref 11.5–15.5)
WBC: 11.2 10*3/uL — ABNORMAL HIGH (ref 4.0–10.5)
nRBC: 0 % (ref 0.0–0.2)

## 2022-10-18 LAB — HEPATIC FUNCTION PANEL
ALT: 134 U/L — ABNORMAL HIGH (ref 0–44)
AST: 770 U/L — ABNORMAL HIGH (ref 15–41)
Albumin: 2.3 g/dL — ABNORMAL LOW (ref 3.5–5.0)
Alkaline Phosphatase: 22 U/L — ABNORMAL LOW (ref 38–126)
Bilirubin, Direct: <0.1 mg/dL (ref 0.0–0.2)
Total Bilirubin: 0.6 mg/dL (ref 0.3–1.2)
Total Protein: 4 g/dL — ABNORMAL LOW (ref 6.5–8.1)

## 2022-10-18 LAB — TYPE AND SCREEN: Unit division: 0

## 2022-10-18 LAB — BLOOD GAS, VENOUS
Acid-base deficit: 7.4 mmol/L — ABNORMAL HIGH (ref 0.0–2.0)
Bicarbonate: 17.3 mmol/L — ABNORMAL LOW (ref 20.0–28.0)
O2 Saturation: 86.7 %
Patient temperature: 37
pCO2, Ven: 32 mmHg — ABNORMAL LOW (ref 44–60)
pH, Ven: 7.34 (ref 7.25–7.43)
pO2, Ven: 49 mmHg — ABNORMAL HIGH (ref 32–45)

## 2022-10-18 LAB — HEMOGLOBIN A1C
Hgb A1c MFr Bld: 5.7 % — ABNORMAL HIGH (ref 4.8–5.6)
Mean Plasma Glucose: 117 mg/dL

## 2022-10-18 LAB — GLUCOSE, CAPILLARY: Glucose-Capillary: 172 mg/dL — ABNORMAL HIGH (ref 70–99)

## 2022-10-18 LAB — PREPARE RBC (CROSSMATCH)

## 2022-10-18 LAB — TSH: TSH: 1.176 u[IU]/mL (ref 0.350–4.500)

## 2022-10-18 LAB — MRSA NEXT GEN BY PCR, NASAL: MRSA by PCR Next Gen: NOT DETECTED

## 2022-10-18 LAB — MAGNESIUM: Magnesium: 1.9 mg/dL (ref 1.7–2.4)

## 2022-10-18 LAB — CK: Total CK: 25443 U/L — ABNORMAL HIGH (ref 49–397)

## 2022-10-18 LAB — BPAM FFP: Blood Product Expiration Date: 202407062359

## 2022-10-18 LAB — ABO/RH: ABO/RH(D): B POS

## 2022-10-18 LAB — HEPARIN LEVEL (UNFRACTIONATED)
Heparin Unfractionated: 0.56 IU/mL (ref 0.30–0.70)
Heparin Unfractionated: <0.1 IU/mL — ABNORMAL LOW (ref 0.30–0.70)

## 2022-10-18 LAB — LACTIC ACID, PLASMA: Lactic Acid, Venous: 2.1 mmol/L (ref 0.5–1.9)

## 2022-10-18 LAB — PROCALCITONIN: Procalcitonin: 2.19 ng/mL

## 2022-10-18 LAB — T4, FREE: Free T4: 1.12 ng/dL (ref 0.61–1.12)

## 2022-10-18 LAB — CORTISOL: Cortisol, Plasma: 42.8 ug/dL

## 2022-10-18 LAB — BRAIN NATRIURETIC PEPTIDE: B Natriuretic Peptide: 80 pg/mL (ref 0.0–100.0)

## 2022-10-18 MED ORDER — CHLORHEXIDINE GLUCONATE CLOTH 2 % EX PADS
6.0000 | MEDICATED_PAD | Freq: Every day | CUTANEOUS | Status: DC
  Administered 2022-10-18 – 2022-11-04 (×16): 6 via TOPICAL

## 2022-10-18 MED ORDER — BENZTROPINE MESYLATE 1 MG PO TABS
2.0000 mg | ORAL_TABLET | Freq: Two times a day (BID) | ORAL | Status: DC
  Administered 2022-10-21 – 2022-10-22 (×3): 2 mg via ORAL
  Filled 2022-10-18 (×14): qty 2

## 2022-10-18 MED ORDER — STERILE WATER FOR INJECTION IV SOLN
INTRAVENOUS | Status: DC
  Filled 2022-10-18: qty 1000
  Filled 2022-10-18: qty 150
  Filled 2022-10-18: qty 1000
  Filled 2022-10-18 (×2): qty 150
  Filled 2022-10-18 (×2): qty 1000
  Filled 2022-10-18 (×2): qty 150
  Filled 2022-10-18 (×2): qty 1000
  Filled 2022-10-18: qty 150
  Filled 2022-10-18: qty 1000

## 2022-10-18 MED ORDER — SODIUM CHLORIDE 0.9% IV SOLUTION: Freq: Once | INTRAVENOUS | Status: AC

## 2022-10-18 MED ORDER — OLANZAPINE 10 MG PO TBDP
10.0000 mg | ORAL_TABLET | Freq: Once | ORAL | Status: AC
  Administered 2022-10-18: 10 mg via ORAL
  Filled 2022-10-18: qty 1

## 2022-10-18 MED ORDER — SODIUM CHLORIDE 0.9 % IV BOLUS
500.0000 mL | Freq: Once | INTRAVENOUS | Status: AC
  Administered 2022-10-18: 500 mL via INTRAVENOUS

## 2022-10-18 MED ORDER — SODIUM CHLORIDE 0.9 % IV SOLN
500.0000 mg | INTRAVENOUS | Status: DC
  Administered 2022-10-18 – 2022-10-21 (×4): 500 mg via INTRAVENOUS
  Filled 2022-10-18 (×5): qty 5

## 2022-10-18 MED ORDER — LORAZEPAM 2 MG/ML IJ SOLN
0.5000 mg | INTRAMUSCULAR | Status: DC | PRN
  Administered 2022-10-19 (×3): 0.5 mg via INTRAVENOUS
  Administered 2022-10-20 (×2): 1 mg via INTRAVENOUS
  Administered 2022-10-21: 0.5 mg via INTRAVENOUS
  Administered 2022-10-22 – 2022-10-29 (×9): 1 mg via INTRAVENOUS
  Filled 2022-10-18 (×17): qty 1

## 2022-10-18 MED ORDER — GABAPENTIN 300 MG PO CAPS: 300.0000 mg | ORAL_CAPSULE | Freq: Two times a day (BID) | ORAL | Status: DC

## 2022-10-18 MED ORDER — BENZTROPINE MESYLATE 1 MG/ML IJ SOLN
2.0000 mg | Freq: Two times a day (BID) | INTRAMUSCULAR | Status: DC
  Administered 2022-10-18 – 2022-10-21 (×6): 2 mg via INTRAMUSCULAR
  Filled 2022-10-18 (×16): qty 2

## 2022-10-18 NOTE — Assessment & Plan Note (Signed)
Stable -PRN bronchodilators and Mucinex

## 2022-10-18 NOTE — Assessment & Plan Note (Signed)
See AKI ?

## 2022-10-18 NOTE — Assessment & Plan Note (Signed)
On admission, tachycardic, tachypneic, febrile. Lactic acid was above 9 meeting septic shock criteria (with dehydration, AKI, rhabdo contributing).  Hypotension seemed more due to acute blood loss 2/2 left neck hematoma.  BP responded to fluids, blood products and midodrine, did not require pressors. Has bacteremia and RLL pneumonia suspect acute aspiration on 7/1 while encephalopathic  --Continue IV antibiotics --Follow cultures --ID consulted for bacteremia --Trend lactate, procal --Monitor fever curve, CBC, hemodynamics

## 2022-10-18 NOTE — Assessment & Plan Note (Signed)
See acute metabolic encephalopathy 

## 2022-10-18 NOTE — Assessment & Plan Note (Signed)
CT head on admission negative. --Mgmt of underlying metabolic issues as outlined, antibiotics for PNA --Resume home psychiatric meds once safe to take PO --Delirium precautions --PRN IV Ativan if severe agitation

## 2022-10-18 NOTE — Assessment & Plan Note (Signed)
-  Resume home Benztropine, BuSpar, clozapine, Depakote, Ingrezza when able to take PO meds

## 2022-10-18 NOTE — Assessment & Plan Note (Signed)
Unknown cause, pt was found on the floor at his family care home, per caregiver's history on admission. --PT/OT evaluations when clinically improved --Imaging was negative for acute injuries / fractures --Neck hematoma developed 7/1 Confirmed on CT chest and neck soft tissue obtained later on 7/1 PM

## 2022-10-18 NOTE — Assessment & Plan Note (Signed)
Foley placed on admission. Hold Cardura given hypotension. Proscar was started on admission Voiding trial when pt is clinically improved Urology follow up

## 2022-10-18 NOTE — Assessment & Plan Note (Signed)
Resume home PO meds when able Allergy listed to haldol, will use IV Ativan PRN if agitation
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
## 2022-10-18 NOTE — Progress Notes (Signed)
*  PRELIMINARY RESULTS* Echocardiogram 2D Echocardiogram has been performed.  Cristela Blue 10/18/2022, 7:44 AM

## 2022-10-18 NOTE — Assessment & Plan Note (Signed)
CK peaked > 50000 Given aggressive IV hydration for this and hypotension, then devoped hypoxia on 7/1, concern for pulmonary edema but found to have RLL collapse. Fluids were held. --Now on bicarb fluids --Trend CK --Nephrology consulted

## 2022-10-18 NOTE — Progress Notes (Addendum)
OT Cancellation Note  Patient Details Name: Austin Marsh MRN: [id number] DOB: 09-Oct-1955   Cancelled Treatment:    Reason Eval/Treat Not Completed: Medical issues which prohibited therapy, Discussed with PT; pt's BP low, not medically appropriate for OT eval at this time. will check back as able.    12:15pm - pt transferred to ICU for change in medical status. OT to sign off, please re-consult when medically appropriate.    Lynetta Tomczak L. Normajean Nash, OTR/L  10/18/22, 12:16 PM

## 2022-10-18 NOTE — Assessment & Plan Note (Signed)
See Acute Urinary Retention

## 2022-10-18 NOTE — Assessment & Plan Note (Signed)
Stage 3a CKD: Baseline Cr 1.17 on 06/04/2022.  Cr on admission 7.78 AKI likely due to severe rhabdomyolysis, sepsis, dehydration, urinary retention secondary to bladder outlet obstruction. --Continue IV fluids  --Avoid using renal toxic medications --Monitor BMP

## 2022-10-18 NOTE — Progress Notes (Signed)
Patient more alert and breathing well. Took patient off bipap and placed on 30L @ 40% HFNC. Patient doing well and sating 98%.

## 2022-10-18 NOTE — Progress Notes (Signed)
ANTICOAGULATION CONSULT NOTE  Pharmacy Consult for heparin infusion Indication: ACS/STEMI  Allergies  Allergen Reactions   Haldol [Haloperidol Lactate] Other (See Comments)    Tardive diskonesia   Other     [employer]ans cause patient to vomit   Thorazine [Chlorpromazine] Other (See Comments)    "messes me all up"   Trazodone And Nefazodone Other (See Comments)    "makes me feel like I'm dying."    Patient Measurements: Height: 5\' 9"  (175.3 cm) Weight: 87.9 kg (193 lb 12.6 oz) IBW/kg (Calculated) : 70.7 Heparin Dosing Weight: 89.1 kg  Vital Signs: Temp: 97.6 F (36.4 C) (07/01 0527) Temp Source: Axillary (07/01 0527) BP: 89/55 (07/01 0527) Pulse Rate: 95 (07/01 0527)  Labs: Recent Labs    10/16/22 2344 10/17/22 0114 10/17/22 0342 10/17/22 0534 10/17/22 0725 10/17/22 1610 10/17/22 1138 10/17/22 1526 10/17/22 1647 10/17/22 2005 10/18/22 0655  HGB 11.5*  --   --   --   --   --   --   --   --   --   --   HCT 34.3*  --   --   --   --   --   --   --   --   --   --   PLT 165  --   --   --   --   --   --   --   --   --   --   APTT  --   --  26  --   --   --   --   --   --   --   --   LABPROT 15.7*  --   --   --   --   --   --   --   --   --   --   INR 1.2  --   --   --   --   --   --   --   --   --   --   HEPARINUNFRC  --   --   --   --   --   --  >1.10*  --   --  0.82* 0.56  CREATININE 1.78*  --   --   --  1.60*  --   --  1.96*  --   --   --   CKTOTAL  --   --   --   --   --  96,045*  --   --  >50,000*  --   --   TROPONINIHS 172* 406*  --  804* 784*  --   --   --   --   --   --     Estimated Creatinine Clearance: 39.6 mL/min (A) (by C-G formula based on SCr of 1.96 mg/dL (H)).  Medical History: Past Medical History:  Diagnosis Date   COPD (chronic obstructive pulmonary disease) (HCC)    Schizoaffective disorder, bipolar type (HCC)    Sleep apnea    Tardive dyskinesia    Thyroid disease     Assessment: Pt is a 67 yo male presenting to ED from care facility  after a fall, found with elevated troponin I level, trending up. Pharmacy consulted to manage heparin for ACS/STEMI.  Goal of Therapy:  Heparin level 0.3-0.7 units/ml Monitor platelets by anticoagulation protocol: Yes  Results: Date/time HL  Comments 06/30@2005  HL=0.82 Supra-therapeutic 950un/hr > 750un/hr 07/01@0655  HL 0.56 Therapeutic x 1   Plan:  HL therapeutic  x 1 with heparin rate 750 un/hr, will continue current rate and repeat HL in 8hr ti confirm therapeutic level. Monitor CBC daily while on heparin infusion  Valicia Rief Rodriguez-Guzman PharmD, BCPS 10/18/2022 7:52 AM

## 2022-10-18 NOTE — Plan of Care (Addendum)
Patient alert and oriented to self only, baseline confusion.  VSS throughout shift.  All meds given on time as ordered.  Pt foley care provided and CHG bath given. POC maintained, will continue to monitor.  Problem: Education: Goal: Knowledge of General Education information will improve Description: Including pain rating scale, medication(s)/side effects and non-pharmacologic comfort measures Outcome: Progressing   Problem: Health Behavior/Discharge Planning: Goal: Ability to manage health-related needs will improve Outcome: Progressing   Problem: Clinical Measurements: Goal: Ability to maintain clinical measurements within normal limits will improve Outcome: Progressing Goal: Will remain free from infection Outcome: Progressing Goal: Diagnostic test results will improve Outcome: Progressing Goal: Respiratory complications will improve Outcome: Progressing Goal: Cardiovascular complication will be avoided Outcome: Progressing   Problem: Activity: Goal: Risk for activity intolerance will decrease Outcome: Progressing   Problem: Nutrition: Goal: Adequate nutrition will be maintained Outcome: Progressing   Problem: Coping: Goal: Level of anxiety will decrease Outcome: Progressing   Problem: Elimination: Goal: Will not experience complications related to bowel motility Outcome: Progressing Goal: Will not experience complications related to urinary retention Outcome: Progressing   Problem: Pain Managment: Goal: General experience of comfort will improve Outcome: Progressing   Problem: Safety: Goal: Ability to remain free from injury will improve Outcome: Progressing   Problem: Skin Integrity: Goal: Risk for impaired skin integrity will decrease Outcome: Progressing   Problem: Fluid Volume: Goal: Hemodynamic stability will improve Outcome: Progressing   Problem: Clinical Measurements: Goal: Diagnostic test results will improve Outcome: Progressing Goal: Signs  and symptoms of infection will decrease Outcome: Progressing   Problem: Respiratory: Goal: Ability to maintain adequate ventilation will improve Outcome: Progressing

## 2022-10-18 NOTE — Progress Notes (Addendum)
Pt transferred from 2A due to hypotension. BP 70/41. HR 93. SPO2 66% on R/A. 4L N/C applied.  11:00-pt placed on 15 Non-rebreather mask. SPO 85% 11:15 pt placed on HFNC Fio60% SPO 90%

## 2022-10-18 NOTE — Progress Notes (Signed)
[name] CLINIC CARDIOLOGY CONSULT NOTE       Patient ID: Austin Marsh MRN: [id number] DOB/AGE: 1955/12/27 67 y.o.  Admit date: 10/16/2022 Referring Physician Dr. Valente David  Primary Physician  Primary Cardiologist Dr. Juliann Pares (one visit in 2016)  Reason for Consultation elevated troponin  HPI: Austin Marsh is a 80NFA with a PMH of schizoaffective disorder, OSA, COPD, CKD III, hypothyroidism who presented to [facility] ED 10/16/2022 from a care facility with altered mental status and a fall.  In the ED he was febrile to 101 F, hypertensive and tachycardic only complaining of back pain.  His troponin was elevated to a peak of 800, EKGs with borderline inferolateral ST depressions and was started on IV heparin out of initial concern for NSTEMI.  Further workup revealed an severely elevated CK to 50,000 with leukocytosis to 11 K, and severe anemia with Hgb downtrending from 11 --6.3.    Interval history: -Seen and examined late morning in the PCU, he was lethargic but is able to tell me he "feels good" but minimally conversant otherwise  -Overnight events noted with dramatic decline in hemoglobin from 11.5-6.3, uptrending creatinine from 1.96--3.17, elevated Pro-Cal to 2.19 -He was hypotensive to 90/45 but was comfortable on room air -Noted firm hematoma on the back of his neck and occipital region pending imaging.  Review of systems limited by patient's mentation    Past Medical History:  Diagnosis Date   COPD (chronic obstructive pulmonary disease) (HCC)    Schizoaffective disorder, bipolar type (HCC)    Sleep apnea    Tardive dyskinesia    Thyroid disease     Past Surgical History:  Procedure Laterality Date   APPENDECTOMY      Medications Prior to Admission  Medication Sig Dispense Refill Last Dose   acetaminophen (TYLENOL) 325 MG tablet Take 650 mg by mouth every 4 (four) hours as needed.   unk at unk   albuterol (VENTOLIN HFA) 108 (90 Base) MCG/ACT inhaler Inhale 2 puffs into  the lungs every 4 (four) hours as needed for wheezing or shortness of breath. 8 g 0 unk at unk   alum & mag hydroxide-simeth (MAALOX/MYLANTA) 200-200-20 MG/5ML suspension Take 30 mLs by mouth daily as needed for indigestion or heartburn.   unk at un   benztropine (COGENTIN) 2 MG tablet Take 2 mg by mouth 2 (two) times daily.   10/16/2022   bismuth subsalicylate (PEPTO BISMOL) 262 MG/15ML suspension Take 15 mLs by mouth every 4 (four) hours as needed.   unk at unk   busPIRone (BUSPAR) 15 MG tablet Take 15 mg by mouth 2 (two) times daily.   10/16/2022   cloZAPine (CLOZARIL) 100 MG tablet Take 100-200 mg by mouth 2 (two) times daily. 100 mg qam 200 mg qpm   10/16/2022   divalproex (DEPAKOTE ER) 500 MG 24 hr tablet Take 1,000 mg by mouth at bedtime.   10/16/2022 at 2000   doxazosin (CARDURA) 4 MG tablet Take 4 mg by mouth daily.   10/16/2022   gabapentin (NEURONTIN) 300 MG capsule Take 300 mg by mouth 3 (three) times daily.   10/16/2022   glycopyrrolate (ROBINUL) 1 MG tablet Take 1 mg by mouth daily.   10/16/2022   guaiFENesin (ROBITUSSIN) 100 MG/5ML liquid Take 200 mg by mouth every 6 (six) hours as needed for cough.   unk at unk   levothyroxine (SYNTHROID) 100 MCG tablet Take 75 mcg by mouth daily before breakfast.   10/16/2022   loperamide (IMODIUM) 2 MG capsule  Take 2 mg by mouth as needed for diarrhea or loose stools.   unk at unk   magnesium hydroxide (MILK OF MAGNESIA) 400 MG/5ML suspension Take 30 mLs by mouth daily as needed for mild constipation.   unk at unk   meclizine (ANTIVERT) 25 MG tablet Take 25 mg by mouth 3 (three) times daily as needed for dizziness.   unk at unk   pantoprazole (PROTONIX) 40 MG tablet Take 40 mg by mouth daily.   10/16/2022   sennosides-docusate sodium (SENOKOT-S) 8.6-50 MG tablet Take 1 tablet by mouth in the morning and at bedtime.   10/16/2022   traZODone (DESYREL) 50 MG tablet Take 50 mg by mouth at bedtime.   10/16/2022   valbenazine (INGREZZA) 80 MG capsule Take 80 mg  by mouth daily.   10/16/2022   vitamin B-12 (CYANOCOBALAMIN) 100 MCG tablet Take 100 mcg by mouth daily.   10/16/2022   Vitamin D, Cholecalciferol, 25 MCG (1000 UT) TABS Take 50 mcg by mouth daily.    10/16/2022   cephALEXin (KEFLEX) 500 MG capsule Take 1 capsule (500 mg total) by mouth 2 (two) times daily. (Patient not taking: Reported on 10/17/2022) 14 capsule 0 Not Taking   polyethylene glycol (MIRALAX) 17 g packet Take 17 g by mouth daily. (Patient not taking: Reported on 10/17/2022) 14 each 0 Not Taking   Social History   Socioeconomic History   Marital status: Married    Spouse name: Not on file   Number of children: Not on file   Years of education: Not on file   Highest education level: Not on file  Occupational History   Not on file  Tobacco Use   Smoking status: Every Day    Types: Cigarettes   Smokeless tobacco: Never  Substance and Sexual Activity   Alcohol use: Not Currently   Drug use: Not Currently   Sexual activity: Not Currently  Other Topics Concern   Not on file  Social History Narrative   Not on file   Social Determinants of Health   Financial Resource Strain: Not on file  Food Insecurity: No Food Insecurity (10/17/2022)   Hunger Vital Sign    Worried About Running Out of Food in the Last Year: Never true    Ran Out of Food in the Last Year: Never true  Transportation Needs: Not on file  Physical Activity: Not on file  Stress: Not on file  Social Connections: Not on file  Intimate Partner Violence: Not on file    History reviewed. No pertinent family history.    Intake/Output Summary (Last 24 hours) at 10/18/2022 1412 Last data filed at 10/18/2022 1400 Gross per 24 hour  Intake 3795.19 ml  Output 250 ml  Net 3545.19 ml    Vitals:   10/18/22 1230 10/18/22 1300 10/18/22 1310 10/18/22 1330  BP: (!) 92/57 95/72 95/72  (!) 77/49  Pulse: 88 85  85  Resp: 17 16  17   Temp:      TempSrc:      SpO2: 96% 96%  94%  Weight:      Height:        PHYSICAL  EXAM General: Chronically ill-appearing elderly male, lethargic and pale in reverse Trendelenburg position with multiple nurses at bedside.   HEENT:  Normocephalic, firm swelling to the back of the patient's neck up to the occipital region c/w large hematoma Neck:  No JVD.  Lungs: Normal respiratory effort on room air. Clear bilaterally to auscultation. No wheezes, crackles, rhonchi.  Heart: HRRR . Normal S1 and S2 without gallops or murmurs.  Abdomen: Non-distended appearing.  Msk: Generalized weakness. Extremities:  No peripheral edema.  Neuro: Lethargic Psych: Awakens to voice, able to tell me he feels "good" but does not answer further questions.  Labs: Basic Metabolic Panel: Recent Labs    10/17/22 0500 10/17/22 0725 10/17/22 1526 10/18/22 0655 10/18/22 1135  NA  --    < > 126* 124*  --   K  --    < > 4.5 4.8  --   CL  --    < > 99 100  --   CO2  --    < > 19* 16*  --   GLUCOSE  --    < > 109* 177*  --   BUN  --    < > 23 30*  --   CREATININE  --    < > 1.96* 3.17*  --   CALCIUM  --    < > 7.4* 7.0*  --   MG 2.3  --   --   --  1.9  PHOS 3.5  --   --   --  5.4*   < > = values in this interval not displayed.   Liver Function Tests: Recent Labs    10/16/22 2344 10/18/22 0655  AST 30 770*  ALT 10 134*  ALKPHOS 44 22*  BILITOT 0.6 0.6  PROT 6.0* 4.0*  ALBUMIN 3.3* 2.3*   Recent Labs    10/16/22 2344  LIPASE 38   CBC: Recent Labs    10/16/22 2344 10/18/22 0655 10/18/22 1135  WBC 11.2* 11.2* 10.6*  NEUTROABS 6.8  --  8.1*  HGB 11.5* 6.3* 5.4*  HCT 34.3* 18.5* 16.3*  MCV 96.6 95.9 97.6  PLT 165 108* 92*   Cardiac Enzymes: Recent Labs    10/17/22 0114 10/17/22 0534 10/17/22 0725 10/17/22 0938 10/17/22 1647 10/18/22 0655  CKTOTAL  --   --   --  78,295* >50,000* 25,443*  TROPONINIHS 406* 804* 784*  --   --   --    BNP: Recent Labs    10/18/22 1135  BNP 80.0   D-Dimer: No results for input(s): "DDIMER" in the last 72 hours. Hemoglobin  A1C: Recent Labs    10/17/22 0725  HGBA1C 5.7*   Fasting Lipid Panel: Recent Labs    10/17/22 0725  CHOL 111  HDL 46  LDLCALC 59  TRIG 28  CHOLHDL 2.4   Thyroid Function Tests: No results for input(s): "TSH", "T4TOTAL", "T3FREE", "THYROIDAB" in the last 72 hours.  Invalid input(s): "FREET3" Anemia Panel: No results for input(s): "VITAMINB12", "FOLATE", "FERRITIN", "TIBC", "IRON", "RETICCTPCT" in the last 72 hours.   Radiology: ECHOCARDIOGRAM COMPLETE  Result Date: 10/18/2022    ECHOCARDIOGRAM REPORT   Patient Name:   NGOZI RECZEK Date of Exam: 10/18/2022 Medical Rec #:  [id number]     Height:       69.0 in Accession #:    [id number]    Weight:       193.8 lb Date of Birth:  07-Sep-1955    BSA:          2.038 m Patient Age:    66 years      BP:           89/55 mmHg Patient Gender: M             HR:           95 bpm. Exam Location:  [facility] Procedure: 2D Echo, Cardiac Doppler and Color Doppler Indications:     NSTEMI I21.4  History:         Patient has no prior history of Echocardiogram examinations.                  COPD; Risk Factors:Sleep Apnea.  Sonographer:     Cristela Blue Referring Phys:  [id number] Brien Few NIU Diagnosing Phys: Marcina Millard MD  Sonographer Comments: No apical window and no subcostal window. Image acquisition challenging due to COPD. IMPRESSIONS  1. Left ventricular ejection fraction, by estimation, is >75%. The left ventricle has hyperdynamic function. The left ventricle has no regional wall motion abnormalities. Left ventricular diastolic parameters were normal.  2. Right ventricular systolic function is normal. The right ventricular size is normal.  3. The mitral valve is normal in structure. Trivial mitral valve regurgitation. No evidence of mitral stenosis.  4. The aortic valve is normal in structure. Aortic valve regurgitation is not visualized. No aortic stenosis is present.  5. The inferior vena cava is normal in size with greater than 50% respiratory variability,  suggesting right atrial pressure of 3 mmHg. FINDINGS  Left Ventricle: Left ventricular ejection fraction, by estimation, is >75%. The left ventricle has hyperdynamic function. The left ventricle has no regional wall motion abnormalities. The left ventricular internal cavity size was normal in size. There is borderline left ventricular hypertrophy. Left ventricular diastolic parameters were normal. Right Ventricle: The right ventricular size is normal. No increase in right ventricular wall thickness. Right ventricular systolic function is normal. Left Atrium: Left atrial size was normal in size. Right Atrium: Right atrial size was normal in size. Pericardium: There is no evidence of pericardial effusion. Mitral Valve: The mitral valve is normal in structure. Trivial mitral valve regurgitation. No evidence of mitral valve stenosis. Tricuspid Valve: The tricuspid valve is normal in structure. Tricuspid valve regurgitation is trivial. No evidence of tricuspid stenosis. Aortic Valve: The aortic valve is normal in structure. Aortic valve regurgitation is not visualized. No aortic stenosis is present. Pulmonic Valve: The pulmonic valve was normal in structure. Pulmonic valve regurgitation is not visualized. No evidence of pulmonic stenosis. Aorta: The aortic root is normal in size and structure. Venous: The inferior vena cava is normal in size with greater than 50% respiratory variability, suggesting right atrial pressure of 3 mmHg. IAS/Shunts: No atrial level shunt detected by color flow Doppler.  LEFT VENTRICLE PLAX 2D LVIDd:         3.40 cm LVIDs:         2.00 cm LV PW:         1.10 cm LV IVS:        1.20 cm LVOT diam:     1.85 cm LVOT Area:     2.69 cm  LEFT ATRIUM         Index LA diam:    2.40 cm 1.18 cm/m   AORTA Ao Root diam: 2.50 cm  SHUNTS Systemic Diam: 1.85 cm Marcina Millard MD Electronically signed by Marcina Millard MD Signature Date/Time: 10/18/2022/1:34:51 PM    Final    DG Chest Port 1  View  Result Date: 10/18/2022 CLINICAL DATA:  Respiratory failure EXAM: PORTABLE CHEST 1 VIEW COMPARISON:  Previous studies including the examination of 10/16/2022 FINDINGS: Transverse diameter of heart is increased. Apparent shift of mediastinum to the right may be due to rotation. Increased markings are seen in right lower lung field. Right lateral CP angle is indistinct. Left lateral  CP angle is clear. There is no pneumothorax. There is marked soft tissue swelling in the left supraclavicular region. IMPRESSION: Increased markings in right lower lung field may suggest atelectasis/pneumonia. Possible small right pleural effusion. There is soft tissue prominence in left supraclavicular region. Possibility of large hematoma in the left side of neck is not excluded. Please correlate with physical examination findings and consider CT neck. These results will be called to the ordering clinician or representative by the Radiologist Assistant, and communication documented in the PACS or [employer]. Electronically Signed   By: Ernie Avena M.D.   On: 10/18/2022 12:42   DG Lumbar Spine 2-3 Views  Result Date: 10/17/2022 CLINICAL DATA:  Recent fall.  Pain. EXAM: LUMBAR SPINE - 2-3 VIEW COMPARISON:  CT abdomen and pelvis 06/01/2021 FINDINGS: There are 5 non-rib-bearing lumbar-type vertebral bodies. No sagittal spondylolisthesis. Minimal posterior L5-S1 disc space narrowing. Moderate L5-S1 greater than L4-5 facet joint sclerosis and hypertrophy. Vertebral body heights are maintained. Moderate atherosclerotic calcifications. IMPRESSION: 1. Minimal posterior L5-S1 disc space narrowing. 2. Moderate L5-S1 greater than L4-5 facet degenerative changes. Electronically Signed   By: Neita Garnet M.D.   On: 10/17/2022 12:20   DG Thoracic Spine 2 View  Result Date: 10/17/2022 CLINICAL DATA:  Recent fall.  Pain. EXAM: THORACIC SPINE 2 VIEWS COMPARISON:  AP chest 10/16/2022, chest two views 05/08/2020 FINDINGS: There  are 12 rib-bearing thoracic type vertebral bodies. Normal frontal alignment. No sagittal spondylolisthesis. Mild multilevel anterior disc space narrowing of the upper thoracic spine. Vertebral body heights are maintained. The visualized heart and lungs are grossly unremarkable. IMPRESSION: Mild multilevel degenerative changes of the upper thoracic spine. Electronically Signed   By: Neita Garnet M.D.   On: 10/17/2022 12:17   DG HIPS BILAT WITH PELVIS MIN 5 VIEWS  Result Date: 10/17/2022 CLINICAL DATA:  Recent fall.  Pain. EXAM: DG HIP (WITH OR WITHOUT PELVIS) 5+V BILAT COMPARISON:  Pelvis and left hip radiographs 03/11/2020, CT pelvis 06/01/2021 FINDINGS: There is diffuse decreased bone mineralization. Mild bilateral femoroacetabular joint space narrowing moderate bilateral superolateral acetabular degenerative osteophytes. The bilateral sacroiliac joint spaces are maintained. No acute fracture is seen. No dislocation. IMPRESSION: Mild bilateral femoroacetabular osteoarthritis. No acute fracture. Electronically Signed   By: Neita Garnet M.D.   On: 10/17/2022 12:15   CT Head Wo Contrast  Result Date: 10/17/2022 CLINICAL DATA:  Mental status change, unknown cause; Sepsis EXAM: CT HEAD WITHOUT CONTRAST CT CHEST, ABDOMEN AND PELVIS WITH CONTRAST TECHNIQUE: Contiguous axial images were obtained from the base of the skull through the vertex without intravenous contrast. Multidetector CT imaging of the chest, abdomen and pelvis was performed following the standard protocol during bolus administration of intravenous contrast. RADIATION DOSE REDUCTION: This exam was performed according to the departmental dose-optimization program which includes automated exposure control, adjustment of the mA and/or kV according to patient size and/or use of iterative reconstruction technique. CONTRAST:  80mL OMNIPAQUE IOHEXOL 300 MG/ML  SOLN COMPARISON:  CT head 05/09/2020, CT chest abdomen pelvis 09/08/2020 FINDINGS: CT HEAD  FINDINGS Brain: Normal anatomic configuration. Parenchymal volume loss is commensurate with the patient's age. No abnormal intra or extra-axial mass lesion or fluid collection. No abnormal mass effect or midline shift. No evidence of acute intracranial hemorrhage or infarct. Ventricular size is normal. Cerebellum unremarkable. Vascular: No asymmetric hyperdense vasculature at the skull base. Skull: Intact Sinuses/Orbits: Paranasal sinuses are clear. Orbits are unremarkable. Other: Mastoid air cells and middle ear cavities are clear. CT CHEST FINDINGS Cardiovascular: Moderate  coronary artery calcification within the left anterior descending coronary artery proximally. Global cardiac size within normal limits. No pericardial effusion. Central pulmonary arteries are of normal caliber. Mild atherosclerotic calcification within the thoracic aorta. No aortic aneurysm. Mediastinum/Nodes: No enlarged mediastinal, hilar, or axillary lymph nodes. Thyroid gland, trachea, and esophagus demonstrate no significant findings. Lungs/Pleura: Stable architectural distortion within the upper lobes bilaterally in keeping with probable post infectious or post inflammatory scarring. No superimposed confluent pulmonary infiltrate. Bibasilar atelectasis. No pneumothorax or pleural effusion. No central obstructing lesion. Musculoskeletal: No acute bone abnormality. No lytic or blastic bone lesion. CT ABDOMEN PELVIS FINDINGS Hepatobiliary: Stable scattered probable cysts within the liver. No enhancing intrahepatic mass. Gallbladder unremarkable. No intra or extrahepatic biliary ductal dilation. Pancreas: Unremarkable Spleen: Unremarkable Adrenals/Urinary Tract: Stable thickening of the adrenal glands bilaterally in keeping with adrenal hyperplasia. Stable 9 mm low-attenuation nodule within the right adrenal gland in keeping with an adrenal adenoma. No follow-up imaging is recommended for this lesion. The kidneys are normal in size and  position. Multiple simple cortical cysts are seen within the kidneys bilaterally for which no follow-up imaging is recommended. The kidneys are otherwise unremarkable. The bladder is distended with a punctate focus of intraluminal gas identified possibly related to recent catheterization. Stomach/Bowel: Appendectomy has been performed. The stomach, small bowel, and large bowel are unremarkable. No evidence of obstruction or focal inflammation. No free intraperitoneal gas or fluid. Vascular/Lymphatic: Aortic atherosclerosis. No enlarged abdominal or pelvic lymph nodes. Reproductive: Mild prostatic hypertrophy. Defect within the central prostate gland likely reflects changes of prior trans urethral resection. Other: No abdominal wall hernia or abnormality. No abdominopelvic ascites. Musculoskeletal: No acute bone abnormality. No lytic or blastic bone lesion. IMPRESSION: 1. No acute intracranial abnormality. No calvarial fracture. 2. Moderate coronary artery calcification. 3. No acute intrathoracic or intra-abdominal pathology identified. 4. Stable 9 mm right adrenal adenoma. Stable changes of adrenal hyperplasia. 5. Marked distention of the bladder possibly reflecting changes of bladder outlet obstruction. 6. Mild prostatic hypertrophy.  Probable post TURP changes. Aortic Atherosclerosis (ICD10-I70.0). Electronically Signed   By: Helyn Numbers M.D.   On: 10/17/2022 03:13   CT CHEST ABDOMEN PELVIS W CONTRAST  Result Date: 10/17/2022 CLINICAL DATA:  Mental status change, unknown cause; Sepsis EXAM: CT HEAD WITHOUT CONTRAST CT CHEST, ABDOMEN AND PELVIS WITH CONTRAST TECHNIQUE: Contiguous axial images were obtained from the base of the skull through the vertex without intravenous contrast. Multidetector CT imaging of the chest, abdomen and pelvis was performed following the standard protocol during bolus administration of intravenous contrast. RADIATION DOSE REDUCTION: This exam was performed according to the  departmental dose-optimization program which includes automated exposure control, adjustment of the mA and/or kV according to patient size and/or use of iterative reconstruction technique. CONTRAST:  80mL OMNIPAQUE IOHEXOL 300 MG/ML  SOLN COMPARISON:  CT head 05/09/2020, CT chest abdomen pelvis 09/08/2020 FINDINGS: CT HEAD FINDINGS Brain: Normal anatomic configuration. Parenchymal volume loss is commensurate with the patient's age. No abnormal intra or extra-axial mass lesion or fluid collection. No abnormal mass effect or midline shift. No evidence of acute intracranial hemorrhage or infarct. Ventricular size is normal. Cerebellum unremarkable. Vascular: No asymmetric hyperdense vasculature at the skull base. Skull: Intact Sinuses/Orbits: Paranasal sinuses are clear. Orbits are unremarkable. Other: Mastoid air cells and middle ear cavities are clear. CT CHEST FINDINGS Cardiovascular: Moderate coronary artery calcification within the left anterior descending coronary artery proximally. Global cardiac size within normal limits. No pericardial effusion. Central pulmonary arteries are of normal caliber.  Mild atherosclerotic calcification within the thoracic aorta. No aortic aneurysm. Mediastinum/Nodes: No enlarged mediastinal, hilar, or axillary lymph nodes. Thyroid gland, trachea, and esophagus demonstrate no significant findings. Lungs/Pleura: Stable architectural distortion within the upper lobes bilaterally in keeping with probable post infectious or post inflammatory scarring. No superimposed confluent pulmonary infiltrate. Bibasilar atelectasis. No pneumothorax or pleural effusion. No central obstructing lesion. Musculoskeletal: No acute bone abnormality. No lytic or blastic bone lesion. CT ABDOMEN PELVIS FINDINGS Hepatobiliary: Stable scattered probable cysts within the liver. No enhancing intrahepatic mass. Gallbladder unremarkable. No intra or extrahepatic biliary ductal dilation. Pancreas: Unremarkable  Spleen: Unremarkable Adrenals/Urinary Tract: Stable thickening of the adrenal glands bilaterally in keeping with adrenal hyperplasia. Stable 9 mm low-attenuation nodule within the right adrenal gland in keeping with an adrenal adenoma. No follow-up imaging is recommended for this lesion. The kidneys are normal in size and position. Multiple simple cortical cysts are seen within the kidneys bilaterally for which no follow-up imaging is recommended. The kidneys are otherwise unremarkable. The bladder is distended with a punctate focus of intraluminal gas identified possibly related to recent catheterization. Stomach/Bowel: Appendectomy has been performed. The stomach, small bowel, and large bowel are unremarkable. No evidence of obstruction or focal inflammation. No free intraperitoneal gas or fluid. Vascular/Lymphatic: Aortic atherosclerosis. No enlarged abdominal or pelvic lymph nodes. Reproductive: Mild prostatic hypertrophy. Defect within the central prostate gland likely reflects changes of prior trans urethral resection. Other: No abdominal wall hernia or abnormality. No abdominopelvic ascites. Musculoskeletal: No acute bone abnormality. No lytic or blastic bone lesion. IMPRESSION: 1. No acute intracranial abnormality. No calvarial fracture. 2. Moderate coronary artery calcification. 3. No acute intrathoracic or intra-abdominal pathology identified. 4. Stable 9 mm right adrenal adenoma. Stable changes of adrenal hyperplasia. 5. Marked distention of the bladder possibly reflecting changes of bladder outlet obstruction. 6. Mild prostatic hypertrophy.  Probable post TURP changes. Aortic Atherosclerosis (ICD10-I70.0). Electronically Signed   By: Helyn Numbers M.D.   On: 10/17/2022 03:13   DG Chest Port 1 View  Result Date: 10/17/2022 CLINICAL DATA:  Sepsis, found down EXAM: PORTABLE CHEST 1 VIEW COMPARISON:  12/11/2021 FINDINGS: The heart size and mediastinal contours are within normal limits. Both lungs are  clear. The visualized skeletal structures are unremarkable. IMPRESSION: No active disease. Electronically Signed   By: Helyn Numbers M.D.   On: 10/17/2022 00:15     TELEMETRY reviewed by me (LT) 10/18/2022 : Sinus rhythm 80s to 90s with significant artifact and baseline wander  EKG reviewed by me: Sinus rhythm borderline ST to depressions inferolaterally compared to priors  Data reviewed by me (LT) 10/18/2022: ED physician note, admission H&P, prior cardiology consult note last 24h vitals tele labs imaging I/O   Principal Problem:   SIRS (systemic inflammatory response syndrome) (HCC) Active Problems:   COPD (chronic obstructive pulmonary disease) (HCC)   Hypothyroidism   Depression with anxiety   Schizoaffective disorder, bipolar type (HCC)   Tobacco abuse   Fall   BPH (benign prostatic hyperplasia)   Acute renal failure superimposed on stage 3a chronic kidney disease (HCC)   Acute metabolic encephalopathy   Hyponatremia   NSTEMI (non-ST elevated myocardial infarction) (HCC)   Acute retention of urine   Rhabdomyolysis    ASSESSMENT AND PLAN:  Dorie Haxton is a 20yoM with a PMH of schizoaffective disorder, OSA, COPD, CKD III, hypothyroidism who presented to [facility] ED 10/16/2022 from a care facility with altered mental status and a fall.  In the ED he was febrile to 101  F, hypertensive and tachycardic only complaining of back pain.  His troponin was elevated to a peak of 800, EKGs with borderline inferolateral ST depressions and was started on IV heparin out of initial concern for NSTEMI.  Further workup revealed an severely elevated CK to 50,000 with leukocytosis to 11 K, and severe anemia with Hgb downtrending from 11 --6.3.    # Acute encephalopathy # Septic shock # Rhabdomyolysis # Acute blood loss anemia # Acute renal failure -Agree with current therapy per primary team, PCCM  # Demand ischemia Initial concern in the emergency department for NSTEMI as troponins were elevated to  a peak of 800, and EKGs showed borderline inferolateral ST depressions, further workup as above revealed a CK elevated to 50 K in the setting of a fall suggest that troponin elevation is demand in the setting of rhabdomyolysis /element of decreased clearance with AKI on CKD 3 in the absence of chest pain. Furthermore, echo showed hyperdynamic LVEF without rWMAs. The patient was initially started on heparin in the ED, but this was stopped this AM with dramatic drop in hgb + soft tissue swelling/hematoma on the back of his neck appreciated this AM.  - defer additional cardiac diagnostics  - heparin contraindicated with ABLA - hold statin with transaminitis / rhabdo - hold aspirin with thrombocytopenia c/f bleeding    Cardiology will sign off. Please haiku with questions or re-engage if needed.    This patient's plan of care was discussed and created with Dr. Darrold Junker and he is in agreement.  Signed: Rebeca Allegra , PA-C 10/18/2022, 2:12 PM [facility]

## 2022-10-18 NOTE — Progress Notes (Signed)
Dr. Denton Lank notified of Rectal temp of 95.5. Bair hugger applied. Will continue to monitor.

## 2022-10-18 NOTE — Assessment & Plan Note (Signed)
Hematoma Left Neck/Shoulder  Acute Blood Loss Anemia Hypotension due to above and ?septic shock 7/1 - Hbg dropped from 11.5 >> 6.3 and pt noted to have new swelling and ecchymosis at the left neck & upper shoulder.  --Heparin was stopped  --Transfused total 3 units pRBC's and 1 FFP --Transferred to stepdown 7/1 --PCCM consulted for pressors if needed - signed off, BP's stabilized --Maintain MAP>65 --Trend Hbg & transfuse if < 7 or signs of ongoing active bleeding

## 2022-10-18 NOTE — Hospital Course (Signed)
HPI on admission 10/17/22:  "Austin Marsh is a 67 y.o. male with medical history significant of COPD, hypothyroidism, depression with anxiety, BPH, CKD-3A, schizoaffective disorder with bipolar, tardive dyskinesia, OSA on CPAP, who presents with fall and AMS."  Caregiver at family care facility.  reported pt was in his normal health condition until 10:30 PM yesterday when pt had fall and was found on the floor. At normal baseline, patient is alert oriented x 3 but today is confused.  On admission, pt was lethargic, oriented to person and place, not to year or situation.  He reported right hip pain with moving right leg, low back pain, some chest pain which he was unable to characterize further, constipation, suprapubic pain.  He was found to have acute urinary retention (973 cc on bladder scan).  Foley catheter was placed.    In the ED - fever of 101, HR 123, RR 26, stable O2 sat on room air.  Per EMS report, patient had low BP, but at time of admission BP was stable at 124/73.   Chest x-ray negative.   CT of head negative for acute intracranial abnormalities.   CT Chest/Abd/Pelvis: Moderate coronary artery calcification. No acute intrathoracic or intra-abdominal pathology identified. Stable 9 mm right adrenal adenoma. Stable changes of adrenal hyperplasia. Marked distention of the bladder possibly reflecting changes of bladder outlet obstruction. Mild prostatic hypertrophy.  Probable post TURP changes.  X-ray of bilateral hips/pelvis negative for fracture.   X-ray of L-spine and T-spine negative for acute fracture, did showed degenerative disc disease    Notable labs - WBC 11.2, severe AKI with Cr 7.78, sodium 123, negative COVID PCR, lactic acid> 9.0 ---> 4.9, troponin level 172, 406, 804, 784.  Sodium 123, magnesium 2.3,  Hemoglobin 11.5.  Pt was admitted to PCU. Subsequently found to have severe rhabdomyolysis with CK peaking above 50k. IV heparin was started and cardiology consulted due to  troponin elevation.  7/1 -- pt hypotensive, requiring IV fluid boluses.  Worsening encephalopathy, Hbg dropped to 6.3, large area of ecchymosis and swelling in the left neck & upper shoulder concerning for hematoma.  Pt was transferred to stepdown for developing shock. PCCM consulted.  Transfused 2 units pRBC's, 1 FFP.

## 2022-10-18 NOTE — Assessment & Plan Note (Signed)
Nicotine patch ordered.

## 2022-10-18 NOTE — Progress Notes (Addendum)
PT Cancellation Note  Patient Details Name: Austin Marsh MRN: [id number] DOB: September 26, 1955   Cancelled Treatment:    Reason Eval/Treat Not Completed: Other (comment). Pt tangentially speaks to PT, supine in bed. BP assessed in supine and with HOB elevated ~30degrees and noted for significantly low BP. MD in room to assess pt during session as well, RN notified of pt status. PT to re-attempt to initiate evaluation as pt is medically appropriate.   Addendum: Pt noted to have transferred to ICU for change in medical status. PT to sign off, please re-consult when patient is medically appropriate.   Olga Coaster PT, DPT 11:22 AM,10/18/22

## 2022-10-18 NOTE — Consult Note (Addendum)
NAME:  Austin Marsh, MRN:  [id number], DOB:  [birth year], LOS: 1 ADMISSION DATE:  10/16/2022, CONSULTATION DATE: 10/18/2022 REFERRING MD: Dr. Denton Lank, CHIEF COMPLAINT: Fall  History of Present Illness:  This is a 67 yo male who presented to [facility] ER on 06/29 via EMS from a care facility following a fall.  At the facility the pt was found on the floor by staff.  Per ER notes EMS reported pt febrile/tachycardia/hypotensive with altered mental status meeting sepsis protocol.  All hx obtained from chart pt currently encephalopathic.    ED Course  Upon arrival to the ER pt remained altered, but baseline unknown.  He did endorse low to mid back pain.  Significant lab results were: Na+ 123/chloride 92/CO2 11/glucose 159/creatinine 1.78/calcium 8.1/anion gap 20/albumin 3.3/troponin 172/lactic acid >9.0/wbc 11.2/hgb 11.5/COVID negative.  CXR negative for acute abnormality.  Code sepsis protocol initiated pt received iv fluid resuscitation, vancomycin, and cefepime.   CT Head negative for acute intracranial abnormality.  CT Abd Pelvis concerning for marked distension of the bladder possibly reflecting changes bladder outlet obstruction.  He was subsequently admitted to the telemetry unit for additional workup and treatment.  See detailed hospital course below under significant events.   CT Head: No acute intracranial abnormality. No calvarial fracture.   CT Chest/Abd/Pelvis: Moderate coronary artery calcification. No acute intrathoracic or intra-abdominal pathology identified. Stable 9 mm right adrenal adenoma. Stable changes of adrenal hyperplasia. Marked distention of the bladder possibly reflecting changes of bladder outlet obstruction. Mild prostatic hypertrophy.  Probable post TURP changes.  Lumbar spine x-ray: Minimal posterior L5-S1 disc space narrowing. Moderate L5-S1 greater than L4-5 facet degenerative changes. Thoracic spine x-ray: Mild multilevel degenerative changes of the upper thoracic spine.     Pertinent  Medical History  COPD Schizoaffective Disorder with Bipolar  OSA (wears CPAP) Tardive Dyskinesia  Hypothyroidism  CKD Stage 3a  Depression  Anxiety   Significant Hospital Events: Including procedures, antibiotic start and stop dates in addition to other pertinent events   06/29: Pt admitted to the telemetry unit following a fall with sepsis, rhabdomyolysis, acute kidney injury superimposed on CKD stage 3a, and acute metabolic encephalopathy  07/01: Pt required transfer to the stepdown unit with acute metabolic encephalopathy, hypotension concerning for acute blood loss, and acute hypoxic respiratory failure secondary to RUL atelectasis.  PCCM team consulted to assist with management   Micro Data:   Blood 06/29>>MRSA (1 out of 4 bottles)  COVID 06/29>>negative  Resp (~20 pathogens) 06/30>>negative  MRSA 07/01>>negative   Anti-infectives (From admission, onward)    Start     Dose/Rate Route Frequency Ordered Stop   10/17/22 2200  vancomycin (VANCOREADY) IVPB 1250 mg/250 mL  Status:  Discontinued        1,250 mg 166.7 mL/hr over 90 Minutes Intravenous Every 24 hours 10/17/22 0532 10/17/22 0826   10/17/22 1200  metroNIDAZOLE (FLAGYL) IVPB 500 mg  Status:  Discontinued        500 mg 100 mL/hr over 60 Minutes Intravenous Every 12 hours 10/17/22 0520 10/18/22 0850   10/17/22 1200  ceFEPIme (MAXIPIME) 2 g in sodium chloride 0.9 % 100 mL IVPB  Status:  Discontinued        2 g 200 mL/hr over 30 Minutes Intravenous Every 12 hours 10/17/22 0532 10/18/22 0850   10/17/22 1000  linezolid (ZYVOX) IVPB 600 mg        600 mg 300 mL/hr over 60 Minutes Intravenous Every 12 hours 10/17/22 0826     10/17/22 0615  ceFEPIme (MAXIPIME) 2 g in sodium chloride 0.9 % 100 mL IVPB  Status:  Discontinued        2 g 200 mL/hr over 30 Minutes Intravenous  Once 10/17/22 0520 10/17/22 0525   10/17/22 0615  vancomycin (VANCOCIN) IVPB 1000 mg/200 mL premix  Status:  Discontinued        1,000  mg 200 mL/hr over 60 Minutes Intravenous  Once 10/17/22 0520 10/17/22 0525   10/17/22 0100  metroNIDAZOLE (FLAGYL) IVPB 500 mg        500 mg 100 mL/hr over 60 Minutes Intravenous  Once 10/17/22 0047 10/17/22 0319   10/16/22 2345  ceFEPIme (MAXIPIME) 2 g in sodium chloride 0.9 % 100 mL IVPB        2 g 200 mL/hr over 30 Minutes Intravenous  Once 10/16/22 2333 10/17/22 0024   10/16/22 2345  vancomycin (VANCOCIN) IVPB 1000 mg/200 mL premix  Status:  Discontinued        1,000 mg 200 mL/hr over 60 Minutes Intravenous  Once 10/16/22 2333 10/16/22 2337   10/16/22 2345  vancomycin (VANCOREADY) IVPB 2000 mg/400 mL        2,000 mg 200 mL/hr over 120 Minutes Intravenous  Once 10/16/22 2337 10/17/22 0319      Interim History / Subjective:  Pt hypoxic on NRB O2 sats 85% transitioned to HHFNC 100%/60L.  Pt initially hypotensive but following 1L NS bolus map >65  Objective   Blood pressure (!) 70/41, pulse 60, temperature (!) 97.5 F (36.4 C), temperature source Axillary, resp. rate 20, height 5\' 9"  (1.753 m), weight 91.5 kg, SpO2 95 %.        Intake/Output Summary (Last 24 hours) at 10/18/2022 1118 Last data filed at 10/17/2022 2203 Gross per 24 hour  Intake 3541.67 ml  Output 150 ml  Net 3391.67 ml   Filed Weights   10/16/22 2331 10/17/22 0459 10/18/22 1000  Weight: 90.7 kg 87.9 kg 91.5 kg   Examination: General: Acute on chronically-ill appearing male, NAD on HHFNC  HENT: Supple, no JVD  Lungs: Faint crackles throughout, even, non labored  Cardiovascular: NSR, s1s2, no r/g, 2+ radial/1+ distal pulses, no edema  Abdomen: +BS x4, obese, soft, non tender, non distended  Extremities: Moves all extremities  Neuro: Lethargic but following commands, PERRLA  GU: Indwelling foley catheter draining yellow urine   Resolved Hospital Problem list     Assessment & Plan:  #Acute metabolic encephalopathy  Hx: Schizoaffective disorder with bipolar and tardive dyskinesia  CT Head 06/30: No acute  intracranial abnormality. No calvarial fracture.  - Correct metabolic derangements  - Once able to tolerate po's continue outpatient benztropine, buspirone, gabapentin, clozapine, and valbenazine   #Elevated troponin secondary to demand ischemia vs. NSTEMI  - Continuous telemetry monitoring  - Heparin gtt discontinue due to acute blood loss anemia  - Troponin peaked at 804 - Continue finasteride   #Hypotension multifactorial: acute blood loss anemia and septic shock  - Aggressive iv fluid; scheduled midodrine; prn blood product administration and/or levophed gtt to maintain map >65 - Cortisol level pending   #Acute hypoxic respiratory failure secondary to RUL atelectasis  Hx: COPD  - Continue Bipap for now; pt High Risk for Intubation  - Maintain O2 sats 88% to 92% - Prn bronchodilator therapy  - Aggressive pulmonary hygiene as tolerated  - Follow CXR and ABG - CT Chest WO Contrast pending   #Acute kidney injury superimposed CKD stage 3a  #Metabolic and lactic acidosis  #Hyponatremia  #  Rhabdomyolysis  - Trend BMP and lactic acid  - Replace electrolytes as indicated  - Monitor UOP - Avoid nephrotoxic medications as able  - Low threshold to consult nephrology  - Continue iv maintenance fluids   #Elevated liver enzymes  - Trend hepatic function panel  - Avoid hepatotoxic medications  - RUQ Limited pending   #MRSA bacteremia (1 out 4 possible contaminant) - Trend WBC and monitor fever curve  - Trend PCT  - Follow cultures  - Continue abx as outlined above   #Acute blood loss anemia secondary to left neck hematoma  #Thrombocytopenia  - Trend CBC  - Monitor for s/sx of bleeding  - Transfuse for hgb <7 and/or active signs of bleeding  - Avoid chemical VTE px; SCD's for now  - CT Soft Tissue Neck pending if following blood product resuscitation pt remains anemic will consider CTA Head/Neck   #Hyperglycemia  - CBG's q4hs  - Sensitive SSI  - Follow hypo/hyperglycemic  protocol   #Fall - Fall precautions  - PT/OT once stabilized   Best Practice (right click and "Reselect all SmartList Selections" daily)   Diet/type: NPO DVT prophylaxis: SCD GI prophylaxis: PPI Lines: N/A Foley:  Yes, and it is still needed Code Status:  full code Last date of multidisciplinary goals of care discussion [N/A]  Labs   CBC: Recent Labs  Lab 10/16/22 2344 10/18/22 0655  WBC 11.2* 11.2*  NEUTROABS 6.8  --   HGB 11.5* 6.3*  HCT 34.3* 18.5*  MCV 96.6 95.9  PLT 165 108*    Basic Metabolic Panel: Recent Labs  Lab 10/16/22 2344 10/17/22 0500 10/17/22 0725 10/17/22 1526 10/18/22 0655  NA 123*  --  125* 126* 124*  K 4.4  --  4.7 4.5 4.8  CL 92*  --  99 99 100  CO2 11*  --  19* 19* 16*  GLUCOSE 159*  --  101* 109* 177*  BUN 18  --  18 23 30*  CREATININE 1.78*  --  1.60* 1.96* 3.17*  CALCIUM 8.1*  --  7.7* 7.4* 7.0*  MG  --  2.3  --   --   --   PHOS  --  3.5  --   --   --    GFR: Estimated Creatinine Clearance: 24.9 mL/min (A) (by C-G formula based on SCr of 3.17 mg/dL (H)). Recent Labs  Lab 10/16/22 2344 10/17/22 0132 10/17/22 0853 10/17/22 1138 10/18/22 0655  WBC 11.2*  --   --   --  11.2*  LATICACIDVEN >9.0* 4.9* 2.7* 3.2*  --     Liver Function Tests: Recent Labs  Lab 10/16/22 2344  AST 30  ALT 10  ALKPHOS 44  BILITOT 0.6  PROT 6.0*  ALBUMIN 3.3*   Recent Labs  Lab 10/16/22 2344  LIPASE 38   No results for input(s): "AMMONIA" in the last 168 hours.  ABG    Component Value Date/Time   HCO3 12.8 (L) 10/16/2022 2344   ACIDBASEDEF 11.9 (H) 10/16/2022 2344   O2SAT 98.5 10/16/2022 2344     Coagulation Profile: Recent Labs  Lab 10/16/22 2344 10/18/22 0655  INR 1.2 1.4*    Cardiac Enzymes: Recent Labs  Lab 10/17/22 0938 10/17/22 1647 10/18/22 0655  CKTOTAL 39,792* >50,000* 25,443*    HbA1C: Hgb A1c MFr Bld  Date/Time Value Ref Range Status  10/17/2022 07:25 AM 5.7 (H) 4.8 - 5.6 % Final    Comment:     (NOTE)  Prediabetes: 5.7 - 6.4         Diabetes: >6.4         Glycemic control for adults with diabetes: <7.0     CBG: Recent Labs  Lab 10/17/22 1737 10/18/22 0836  GLUCAP 121* 172*    Review of Systems:   Unable to assess pt encephalopathic   Past Medical History:  He,  has a past medical history of COPD (chronic obstructive pulmonary disease) (HCC), Schizoaffective disorder, bipolar type (HCC), Sleep apnea, Tardive dyskinesia, and Thyroid disease.   Surgical History:   Past Surgical History:  Procedure Laterality Date   APPENDECTOMY       Social History:   reports that he has been smoking cigarettes. He has never used smokeless tobacco. He reports that he does not currently use alcohol. He reports that he does not currently use drugs.   Family History:  His family history is not on file.   Allergies Allergies  Allergen Reactions   Haldol [Haloperidol Lactate] Other (See Comments)    Tardive diskonesia   Other     [employer] cause patient to vomit   Thorazine [Chlorpromazine] Other (See Comments)    "messes me all up"   Trazodone And Nefazodone Other (See Comments)    "makes me feel like I'm dying."     Home Medications  Prior to Admission medications   Medication Sig Start Date End Date Taking? Authorizing Provider  acetaminophen (TYLENOL) 325 MG tablet Take 650 mg by mouth every 4 (four) hours as needed.   Yes [provider]  albuterol (VENTOLIN HFA) 108 (90 Base) MCG/ACT inhaler Inhale 2 puffs into the lungs every 4 (four) hours as needed for wheezing or shortness of breath. 01/23/20  Yes Shaune Pollack, MD  alum & mag hydroxide-simeth (MAALOX/MYLANTA) 200-200-20 MG/5ML suspension Take 30 mLs by mouth daily as needed for indigestion or heartburn.   Yes [provider]  benztropine (COGENTIN) 2 MG tablet Take 2 mg by mouth 2 (two) times daily.   Yes [provider]  bismuth subsalicylate (PEPTO BISMOL) 262 MG/15ML suspension  Take 15 mLs by mouth every 4 (four) hours as needed.   Yes [provider]  busPIRone (BUSPAR) 15 MG tablet Take 15 mg by mouth 2 (two) times daily.   Yes [provider]  cloZAPine (CLOZARIL) 100 MG tablet Take 100-200 mg by mouth 2 (two) times daily. 100 mg qam 200 mg qpm   Yes [provider]  divalproex (DEPAKOTE ER) 500 MG 24 hr tablet Take 1,000 mg by mouth at bedtime.   Yes [provider]  doxazosin (CARDURA) 4 MG tablet Take 4 mg by mouth daily.   Yes [provider]  gabapentin (NEURONTIN) 300 MG capsule Take 300 mg by mouth 3 (three) times daily.   Yes [provider]  glycopyrrolate (ROBINUL) 1 MG tablet Take 1 mg by mouth daily.   Yes [provider]  guaiFENesin (ROBITUSSIN) 100 MG/5ML liquid Take 200 mg by mouth every 6 (six) hours as needed for cough.   Yes [provider]  levothyroxine (SYNTHROID) 100 MCG tablet Take 75 mcg by mouth daily before breakfast.   Yes [provider]  loperamide (IMODIUM) 2 MG capsule Take 2 mg by mouth as needed for diarrhea or loose stools.   Yes [provider]  magnesium hydroxide (MILK OF MAGNESIA) 400 MG/5ML suspension Take 30 mLs by mouth daily as needed for mild constipation.   Yes [provider]  meclizine (  ANTIVERT) 25 MG tablet Take 25 mg by mouth 3 (three) times daily as needed for dizziness.   Yes [provider]  pantoprazole (PROTONIX) 40 MG tablet Take 40 mg by mouth daily.   Yes [provider]  sennosides-docusate sodium (SENOKOT-S) 8.6-50 MG tablet Take 1 tablet by mouth in the morning and at bedtime.   Yes [provider]  traZODone (DESYREL) 50 MG tablet Take 50 mg by mouth at bedtime.   Yes [provider]  valbenazine (INGREZZA) 80 MG capsule Take 80 mg by mouth daily.   Yes [provider]  vitamin B-12 (CYANOCOBALAMIN) 100 MCG tablet Take 100 mcg by mouth daily.   Yes [provider]  Vitamin D, Cholecalciferol, 25 MCG (1000 UT) TABS Take 50 mcg by mouth daily.    Yes [provider]  cephALEXin (KEFLEX) 500 MG capsule Take 1 capsule (500 mg total) by mouth 2 (two) times daily. Patient not taking: Reported on 10/17/2022 05/09/20   Ward, Layla Maw, DO  polyethylene glycol (MIRALAX) 17 g packet Take 17 g by mouth daily. Patient not taking: Reported on 10/17/2022 06/01/21   Georga Hacking, MD     Critical care time: 28 minutes      Zada Girt, [facility]  Pulmonary/Critical Care Pager [phone] (please enter 7 digits) PCCM Consult Pager [phone] (please enter 7 digits)

## 2022-10-19 DIAGNOSIS — A419 Sepsis, unspecified organism: Secondary | ICD-10-CM

## 2022-10-19 DIAGNOSIS — D649 Anemia, unspecified: Secondary | ICD-10-CM

## 2022-10-19 DIAGNOSIS — R6521 Severe sepsis with septic shock: Secondary | ICD-10-CM

## 2022-10-19 DIAGNOSIS — M6282 Rhabdomyolysis: Secondary | ICD-10-CM | POA: Diagnosis not present

## 2022-10-19 DIAGNOSIS — R7881 Bacteremia: Secondary | ICD-10-CM | POA: Diagnosis present

## 2022-10-19 DIAGNOSIS — W1830XD Fall on same level, unspecified, subsequent encounter: Secondary | ICD-10-CM | POA: Diagnosis not present

## 2022-10-19 DIAGNOSIS — B958 Unspecified staphylococcus as the cause of diseases classified elsewhere: Secondary | ICD-10-CM | POA: Diagnosis present

## 2022-10-19 DIAGNOSIS — B957 Other staphylococcus as the cause of diseases classified elsewhere: Secondary | ICD-10-CM

## 2022-10-19 DIAGNOSIS — N179 Acute kidney failure, unspecified: Secondary | ICD-10-CM | POA: Diagnosis not present

## 2022-10-19 DIAGNOSIS — J9601 Acute respiratory failure with hypoxia: Secondary | ICD-10-CM | POA: Diagnosis not present

## 2022-10-19 DIAGNOSIS — R6510 Systemic inflammatory response syndrome (SIRS) of non-infectious origin without acute organ dysfunction: Secondary | ICD-10-CM | POA: Diagnosis not present

## 2022-10-19 DIAGNOSIS — F250 Schizoaffective disorder, bipolar type: Secondary | ICD-10-CM | POA: Diagnosis not present

## 2022-10-19 LAB — BPAM RBC
Blood Product Expiration Date: 202407182359
Blood Product Expiration Date: 202407192359
Blood Product Expiration Date: 202408052359
Blood Product Expiration Date: 202408052359
ISSUE DATE / TIME: 202407011209
ISSUE DATE / TIME: 202407011529
ISSUE DATE / TIME: 202407011740
Unit Type and Rh: 5100
Unit Type and Rh: 5100
Unit Type and Rh: 7300
Unit Type and Rh: 7300

## 2022-10-19 LAB — BPAM FFP
ISSUE DATE / TIME: 202407011700
Unit Type and Rh: 7300

## 2022-10-19 LAB — CBC
HCT: 25.9 % — ABNORMAL LOW (ref 39.0–52.0)
HCT: 25.2 % — ABNORMAL LOW (ref 39.0–52.0)
Hemoglobin: 9.3 g/dL — ABNORMAL LOW (ref 13.0–17.0)
Hemoglobin: 9 g/dL — ABNORMAL LOW (ref 13.0–17.0)
MCH: 31.1 pg (ref 26.0–34.0)
MCH: 30.6 pg (ref 26.0–34.0)
MCHC: 35.9 g/dL (ref 30.0–36.0)
MCHC: 35.7 g/dL (ref 30.0–36.0)
MCV: 86.6 fL (ref 80.0–100.0)
MCV: 85.7 fL (ref 80.0–100.0)
Platelets: 83 10*3/uL — ABNORMAL LOW (ref 150–400)
Platelets: 82 10*3/uL — ABNORMAL LOW (ref 150–400)
RBC: 2.99 MIL/uL — ABNORMAL LOW (ref 4.22–5.81)
RBC: 2.94 MIL/uL — ABNORMAL LOW (ref 4.22–5.81)
RDW: 15.6 % — ABNORMAL HIGH (ref 11.5–15.5)
RDW: 15.7 % — ABNORMAL HIGH (ref 11.5–15.5)
WBC: 10.8 10*3/uL — ABNORMAL HIGH (ref 4.0–10.5)
WBC: 11.6 10*3/uL — ABNORMAL HIGH (ref 4.0–10.5)
nRBC: 0 % (ref 0.0–0.2)
nRBC: 0.2 % (ref 0.0–0.2)

## 2022-10-19 LAB — BASIC METABOLIC PANEL
Anion gap: 11 (ref 5–15)
BUN: 39 mg/dL — ABNORMAL HIGH (ref 8–23)
CO2: 20 mmol/L — ABNORMAL LOW (ref 22–32)
Calcium: 7.1 mg/dL — ABNORMAL LOW (ref 8.9–10.3)
Chloride: 96 mmol/L — ABNORMAL LOW (ref 98–111)
Creatinine, Ser: 5.14 mg/dL — ABNORMAL HIGH (ref 0.61–1.24)
GFR, Estimated: 11 mL/min — ABNORMAL LOW (ref >60)
Glucose, Bld: 102 mg/dL — ABNORMAL HIGH (ref 70–99)
Potassium: 4.4 mmol/L (ref 3.5–5.1)
Sodium: 127 mmol/L — ABNORMAL LOW (ref 135–145)

## 2022-10-19 LAB — COMPREHENSIVE METABOLIC PANEL
ALT: 124 U/L — ABNORMAL HIGH (ref 0–44)
AST: 606 U/L — ABNORMAL HIGH (ref 15–41)
Albumin: 2.5 g/dL — ABNORMAL LOW (ref 3.5–5.0)
Alkaline Phosphatase: 25 U/L — ABNORMAL LOW (ref 38–126)
Anion gap: 12 (ref 5–15)
BUN: 33 mg/dL — ABNORMAL HIGH (ref 8–23)
CO2: 17 mmol/L — ABNORMAL LOW (ref 22–32)
Calcium: 7 mg/dL — ABNORMAL LOW (ref 8.9–10.3)
Chloride: 98 mmol/L (ref 98–111)
Creatinine, Ser: 4.22 mg/dL — ABNORMAL HIGH (ref 0.61–1.24)
GFR, Estimated: 15 mL/min — ABNORMAL LOW (ref >60)
Glucose, Bld: 117 mg/dL — ABNORMAL HIGH (ref 70–99)
Potassium: 4.4 mmol/L (ref 3.5–5.1)
Sodium: 127 mmol/L — ABNORMAL LOW (ref 135–145)
Total Bilirubin: 1 mg/dL (ref 0.3–1.2)
Total Protein: 4.3 g/dL — ABNORMAL LOW (ref 6.5–8.1)

## 2022-10-19 LAB — TYPE AND SCREEN
ABO/RH(D): B POS
Antibody Screen: NEGATIVE
Unit division: 0
Unit division: 0
Unit division: 0
Unit division: 0

## 2022-10-19 LAB — PREPARE FRESH FROZEN PLASMA

## 2022-10-19 LAB — HEPATITIS PANEL, ACUTE
HCV Ab: NONREACTIVE
Hep A IgM: NONREACTIVE
Hep B C IgM: NONREACTIVE
Hepatitis B Surface Ag: NONREACTIVE

## 2022-10-19 LAB — MAGNESIUM: Magnesium: 1.8 mg/dL (ref 1.7–2.4)

## 2022-10-19 LAB — OSMOLALITY, URINE: Osmolality, Ur: 259 mOsm/kg — ABNORMAL LOW (ref 300–900)

## 2022-10-19 LAB — BLOOD GAS, VENOUS
Acid-base deficit: 5.9 mmol/L — ABNORMAL HIGH (ref 0.0–2.0)
Bicarbonate: 18.6 mmol/L — ABNORMAL LOW (ref 20.0–28.0)
O2 Saturation: 82.1 %
Patient temperature: 37
pCO2, Ven: 33 mmHg — ABNORMAL LOW (ref 44–60)
pH, Ven: 7.36 (ref 7.25–7.43)
pO2, Ven: 47 mmHg — ABNORMAL HIGH (ref 32–45)

## 2022-10-19 LAB — BLOOD GAS, ARTERIAL
Bicarbonate: 14.7 mmol/L — ABNORMAL LOW (ref 20.0–28.0)
Drawn by: 30136
O2 Saturation: 99.1 %
Patient temperature: 35
pCO2 arterial: 29 mmHg — ABNORMAL LOW (ref 32–48)
pH, Arterial: 7.3 — ABNORMAL LOW (ref 7.35–7.45)
pO2, Arterial: 73 mmHg — ABNORMAL LOW (ref 83–108)

## 2022-10-19 LAB — PHOSPHORUS: Phosphorus: 5.8 mg/dL — ABNORMAL HIGH (ref 2.5–4.6)

## 2022-10-19 LAB — PROTIME-INR
INR: 1.3 — ABNORMAL HIGH (ref 0.8–1.2)
Prothrombin Time: 16.2 seconds — ABNORMAL HIGH (ref 11.4–15.2)

## 2022-10-19 LAB — LACTIC ACID, PLASMA
Lactic Acid, Venous: 2.4 mmol/L (ref 0.5–1.9)
Lactic Acid, Venous: 1.8 mmol/L (ref 0.5–1.9)

## 2022-10-19 LAB — HEMOGLOBIN AND HEMATOCRIT, BLOOD
HCT: 24.8 % — ABNORMAL LOW (ref 39.0–52.0)
HCT: 26.5 % — ABNORMAL LOW (ref 39.0–52.0)
Hemoglobin: 9 g/dL — ABNORMAL LOW (ref 13.0–17.0)
Hemoglobin: 9.2 g/dL — ABNORMAL LOW (ref 13.0–17.0)

## 2022-10-19 LAB — CK: Total CK: 19573 U/L — ABNORMAL HIGH (ref 49–397)

## 2022-10-19 LAB — SODIUM, URINE, RANDOM: Sodium, Ur: 60 mmol/L

## 2022-10-19 LAB — PROCALCITONIN: Procalcitonin: 1.72 ng/mL

## 2022-10-19 MED ORDER — MAGNESIUM SULFATE 2 GM/50ML IV SOLN
2.0000 g | Freq: Once | INTRAVENOUS | Status: AC
  Administered 2022-10-19: 2 g via INTRAVENOUS
  Filled 2022-10-19: qty 50

## 2022-10-19 MED ORDER — FUROSEMIDE 10 MG/ML IJ SOLN
80.0000 mg | Freq: Once | INTRAMUSCULAR | Status: AC
  Administered 2022-10-19: 80 mg via INTRAVENOUS
  Filled 2022-10-19: qty 8

## 2022-10-19 MED ORDER — MORPHINE SULFATE (PF) 2 MG/ML IV SOLN
2.0000 mg | INTRAVENOUS | Status: DC | PRN
  Administered 2022-10-19 – 2022-11-03 (×12): 2 mg via INTRAVENOUS
  Filled 2022-10-19 (×11): qty 1

## 2022-10-19 NOTE — Progress Notes (Signed)
PHARMACY - PHYSICIAN COMMUNICATION CRITICAL VALUE ALERT - BLOOD CULTURE IDENTIFICATION (BCID)  Austin Marsh is an 67 y.o. male who presented to [facility] on 10/16/2022 with a chief complaint of SIRS, sepsis, PNA  Assessment:  Gram positive rods in 2 of 4 bottles (include suspected source if known)  Name of physician (or Provider) Contacted: Cheryll Cockayne Rust-Chester, NP   Current antibiotics: Zyvox ,  Azithromycin   Changes to prescribed antibiotics recommended:  Recommendations declined by provider - infection may be polymicrobial  - suggested d/c azithromycin,  NP wants to continue azithromycin b/c pt has bad PNA and wants await results of sputum culture  Results for orders placed or performed during the hospital encounter of 10/16/22  Blood Culture ID Panel (Reflexed) (Collected: 10/16/2022 11:44 PM)  Result Value Ref Range   Enterococcus faecalis NOT DETECTED NOT DETECTED   Enterococcus Faecium NOT DETECTED NOT DETECTED   Listeria monocytogenes NOT DETECTED NOT DETECTED   Staphylococcus species DETECTED (A) NOT DETECTED   Staphylococcus aureus (BCID) NOT DETECTED NOT DETECTED   Staphylococcus epidermidis DETECTED (A) NOT DETECTED   Staphylococcus lugdunensis NOT DETECTED NOT DETECTED   Streptococcus species NOT DETECTED NOT DETECTED   Streptococcus agalactiae NOT DETECTED NOT DETECTED   Streptococcus pneumoniae NOT DETECTED NOT DETECTED   Streptococcus pyogenes NOT DETECTED NOT DETECTED   A.calcoaceticus-baumannii NOT DETECTED NOT DETECTED   Bacteroides fragilis NOT DETECTED NOT DETECTED   Enterobacterales NOT DETECTED NOT DETECTED   Enterobacter cloacae complex NOT DETECTED NOT DETECTED   Escherichia coli NOT DETECTED NOT DETECTED   Klebsiella aerogenes NOT DETECTED NOT DETECTED   Klebsiella oxytoca NOT DETECTED NOT DETECTED   Klebsiella pneumoniae NOT DETECTED NOT DETECTED   Proteus species NOT DETECTED NOT DETECTED   Salmonella species NOT DETECTED NOT DETECTED    Serratia marcescens NOT DETECTED NOT DETECTED   Haemophilus influenzae NOT DETECTED NOT DETECTED   Neisseria meningitidis NOT DETECTED NOT DETECTED   Pseudomonas aeruginosa NOT DETECTED NOT DETECTED   Stenotrophomonas maltophilia NOT DETECTED NOT DETECTED   Candida albicans NOT DETECTED NOT DETECTED   Candida auris NOT DETECTED NOT DETECTED   Candida glabrata NOT DETECTED NOT DETECTED   Candida krusei NOT DETECTED NOT DETECTED   Candida parapsilosis NOT DETECTED NOT DETECTED   Candida tropicalis NOT DETECTED NOT DETECTED   Cryptococcus neoformans/gattii NOT DETECTED NOT DETECTED   Methicillin resistance mecA/C DETECTED (A) NOT DETECTED    Joslin Doell D 10/19/2022  3:25 AM

## 2022-10-19 NOTE — Assessment & Plan Note (Signed)
Initial blood cx +staph epidermidis felt contaminant. Now GPC's in anaerobic bottle only, GPR's in aerobic bottle only. --On IV Linezolid for this, IV Zithromax to cover PNA. --Continue current antibiotics --ID is following

## 2022-10-19 NOTE — Assessment & Plan Note (Signed)
Right lower lobe pneumonia, not POA, suspect acute aspiration event on 7/1. --On Linezolid IV for gram+ bacteremia --On Zithromax IV --Continue antibiotics --Wean O2 as tolerated, maintain spO2 >90%

## 2022-10-19 NOTE — Progress Notes (Addendum)
Progress Note   Patient: Austin Marsh ZOX:[id number] DOB: 11-02-1955 DOA: 10/16/2022     2 DOS: the patient was seen and examined on 10/19/2022   Brief hospital course: HPI on admission 10/17/22:  "[name] is a 67 y.o. male with medical history significant of COPD, hypothyroidism, depression with anxiety, BPH, CKD-3A, schizoaffective disorder with bipolar, tardive dyskinesia, OSA on CPAP, who presents with fall and AMS."  Caregiver at family care facility.  reported pt was in his normal health condition until 10:30 PM yesterday when pt had fall and was found on the floor. At normal baseline, patient is alert oriented x 3 but today is confused.  On admission, pt was lethargic, oriented to person and place, not to year or situation.  He reported right hip pain with moving right leg, low back pain, some chest pain which he was unable to characterize further, constipation, suprapubic pain.  He was found to have acute urinary retention (973 cc on bladder scan).  Foley catheter was placed.    In the ED - fever of 101, HR 123, RR 26, stable O2 sat on room air.  Per EMS report, patient had low BP, but at time of admission BP was stable at 124/73.   Chest x-ray negative.   CT of head negative for acute intracranial abnormalities.   CT Chest/Abd/Pelvis: Moderate coronary artery calcification. No acute intrathoracic or intra-abdominal pathology identified. Stable 9 mm right adrenal adenoma. Stable changes of adrenal hyperplasia. Marked distention of the bladder possibly reflecting changes of bladder outlet obstruction. Mild prostatic hypertrophy.  Probable post TURP changes.  X-ray of bilateral hips/pelvis negative for fracture.   X-ray of L-spine and T-spine negative for acute fracture, did showed degenerative disc disease    Notable labs - WBC 11.2, severe AKI with Cr 7.78, sodium 123, negative COVID PCR, lactic acid> 9.0 ---> 4.9, troponin level 172, 406, 804, 784.  Sodium 123, magnesium 2.3,   Hemoglobin 11.5.  Pt was admitted to PCU. Subsequently found to have severe rhabdomyolysis with CK peaking above 50k. IV heparin was started and cardiology consulted due to troponin elevation.  7/1 -- pt hypotensive, requiring IV fluid boluses.  Worsening encephalopathy, Hbg dropped to 6.3, large area of ecchymosis and swelling in the left neck & upper shoulder concerning for hematoma, later confirmed on CT check and neck soft tissue.  Pt was transferred to stepdown for developing shock. PCCM consulted.  Transfused 2 units pRBC's, 1 FFP.  7/2 -- BP's improved after 3 units pRBC's, 1 FFP total.  Hbg improved from nadir 5.4 to 10.1 >> 9.3 this AM.  +Blood cultures with GPC's and GPR's.  ID consulted.  CK trending down. Nephrology consulted with worsening renal function and poor urine output reported.  Assessment and Plan: * Septic shock (HCC) On admission, tachycardic, tachypneic, febrile. Lactic acid was above 9 meeting septic shock criteria (with dehydration, AKI, rhabdo contributing).  Hypotension seemed more due to acute blood loss 2/2 left neck hematoma.  BP responded to fluids, blood products and midodrine, did not require pressors. Has bacteremia and RLL pneumonia suspect acute aspiration on 7/1 while encephalopathic  --Continue IV antibiotics --Follow cultures --ID consulted for bacteremia --Trend lactate, procal --Monitor fever curve, CBC, hemodynamics  Acute respiratory failure with hypoxia (HCC) Right lower lobe pneumonia, not POA, suspect acute aspiration event on 7/1. --On Linezolid IV for gram+ bacteremia --On Zithromax IV --Continue antibiotics --Wean O2 as tolerated, maintain spO2 >90%  Bacteremia due to Staphylococcus Initial blood cx +staph epidermidis  felt contaminant. Now GPC's in anaerobic bottle only, GPR's in aerobic bottle only. --On IV Linezolid for this, IV Zithromax to cover PNA. --Continue current antibiotics --Consult ID  Hematoma Hematoma Left  Neck/Shoulder  Acute Blood Loss Anemia Hypotension due to above and ?septic shock 7/1 - Hbg dropped from 11.5 >> 6.3 and pt noted to have new swelling and ecchymosis at the left neck & upper shoulder.  --Heparin was stopped  --Transfused total 3 units pRBC's and 1 FFP --Transferred to stepdown 7/1 --PCCM consulted for pressors if needed - signed off, BP's stabilized --Maintain MAP>65 --Trend Hbg & transfuse if < 7 or signs of ongoing active bleeding  Rhabdomyolysis CK peaked > 50000 Given aggressive IV hydration for this and hypotension, then devoped hypoxia on 7/1, concern for pulmonary edema but found to have RLL collapse. Fluids were held. --Now on bicarb fluids --Trend CK --Nephrology consulted  Acute metabolic encephalopathy CT head on admission negative.  Multifactorial due to metabolic derangements, sepsis / bacteremia, underlying psych conditions --Mgmt of underlying metabolic issues as outlined, antibiotics for PNA --Resume home psychiatric meds once safe to take PO --Delirium precautions --PRN IV Ativan if severe agitation  Acute kidney injury (HCC) Stage 3a CKD: Baseline Cr 1.17 on 06/04/2022.  Cr on admission 7.78 AKI likely due to severe rhabdomyolysis, sepsis, dehydration, urinary retention secondary to bladder outlet obstruction. --Consult nephrology, appreciate recs --Continue IV fluids  - currently on bicarb drip --Avoid using renal toxic medications --Monitor BMP  Acute retention of urine Foley placed on admission. Hold Cardura given hypotension. Proscar was started on admission Voiding trial when pt is clinically improved Urology follow up  NSTEMI (non-ST elevated myocardial infarction) (HCC) Elevated troponin - demand ischemia & delayed clearance with severe AKI. -Cardiology was consulted, signed off --IV heparin stopped 7/1 AM with acute anemia and hematoma --Monitor   Fall Unknown cause, pt was found on the floor at his family care home, per  caregiver's history on admission. --PT/OT evaluations when clinically improved --Imaging was negative for acute injuries / fractures --Neck hematoma developed 7/1 Confirmed on CT chest and neck soft tissue obtained later on 7/1 PM  COPD (chronic obstructive pulmonary disease) (HCC) Stable -PRN bronchodilators and Mucinex  Hypothyroidism Na 127, unchanged. Saline fluids stopped due to hypoxia on 7/1, currently on bicarb drip. --Monitor BMP --Monitor volume status --Neprhology consulted  --Check serum & urine osmolality, TSH, urine sodium, cortisol with AM labs  BPH (benign prostatic hyperplasia) See Acute Urinary Retention  Acute renal failure superimposed on stage 3a chronic kidney disease (HCC) See AKI  Schizoaffective disorder, bipolar type (HCC) Resume home PO meds when able Allergy listed to haldol, will use IV Ativan PRN if agitation  Depression with anxiety -Resume home Benztropine, BuSpar, clozapine, Depakote, Ingrezza when able to take PO meds  Tobacco abuse Nicotine patch ordered  Altered mental status See acute metabolic encephalopathy        Subjective: Pt seen in ICU this AM. Still encephalopathic, does not make eye contact or track with his eyes. Constantly mumbles incoherent speech. Does follow commands.  No acute events reported.  BP's stable after blood products yesterday.  RN reported pt complained of neck pain from the hematoma.     Physical Exam: Vitals:   10/19/22 1600 10/19/22 1700 10/19/22 1800 10/19/22 1835  BP: (!) 104/56 (!) 96/52 (!) 91/53 130/80  Pulse: 80 78 75 81  Resp: 13 12 12 12   Temp:      TempSrc:  SpO2: 97% 99% 97% 99%  Weight:      Height:       General exam: awake, alert, no acute distress HEENT: area of ecchymosis and swelling on left neck (image below) appears stable today, moist mucus membranes, hearing grossly normal  Respiratory system: CTAB on exam limited by pt mubling, no wheezes, normal respiratory  effort. Cardiovascular system: normal S1/S2, RRR, no JVD, murmurs, rubs, gallops, no pedal edema.   Gastrointestinal system: soft, NT, ND, no HSM felt, +bowel sounds. Central nervous system: very limited exam due to encephalopathy and incoherent speech, follows commands Extremities: mittens on b/l hands, moves all extremities, no edema, normal tone Skin: dry, intact, normal temperature, pale appearing Psychiatry: exam limited by encephalopathy      Data Reviewed:   Notable labs -- Cr 3.17 >> 4.2, Na 124 >> 127, bicarb 16>>17, glucose 117, BUN 33, Ca 7.0, albumin 2.5, AST 770 >> 606, ALT 124, normal Tbili,  CK improved 25443 >> 19573 procal 2.19 >> 1.72,  WBC 11.6,  Hbg 6.3 >> 5.4 >> 10.1 >> 9.0 >> 9.0 INR 1.3, platelets 82k   Normal TSH and free T4 Normal cortisol  Family Communication: Unable to reach contact [name] when attempted by phone. Will try again as able.  Disposition: Status is: Inpatient Remains inpatient appropriate because: severity of illness, pt is critically ill with multiple acute issues as above   Planned Discharge Destination: Home    Time spent: 55 minutes with times at bedside and in coordination of care  Author: Pennie Banter, DO 10/19/2022 6:45 PM  For on call review [email or web address].

## 2022-10-19 NOTE — Assessment & Plan Note (Signed)
Na 127, unchanged. Saline fluids stopped due to hypoxia on 7/1, currently on bicarb drip. --Monitor BMP --Monitor volume status --Neprhology consulted  --Check serum & urine osmolality, TSH, urine sodium, cortisol with AM labs 

## 2022-10-19 NOTE — TOC Progression Note (Signed)
Transition of Care [facility]) - Progression Note    Patient Details  Name: Austin Marsh MRN: [id number] Date of Birth: April 26, 1955  [facility] CM/SW Contact  Kreg Shropshire, RN Phone Number: 10/19/2022, 11:31 AM  Clinical Narrative:     Received notification of a POA needed. Pt has a wife named Austin Marsh. She is inpt here at [facility]. Spoke with CM Michaela and she stated pt is AAOx4 and awaiting placement. She is able to make decisions for her herself and husband. We also found another point of contact. Her name is Austin Marsh who is listed as the daughter of Austin Marsh. Unsure if she is also the daughter of Austin Marsh. [facility] nursing station number is [phone].       Expected Discharge Plan and Services                                               Social Determinants of Health (SDOH) Interventions SDOH Screenings   Food Insecurity: No Food Insecurity (10/17/2022)  Housing: Patient Unable To Answer (10/17/2022)  Tobacco Use: High Risk (10/16/2022)    Readmission Risk Interventions    03/17/2020    1:48 PM  Readmission Risk Prevention Plan  Transportation Screening Complete  Palliative Care Screening Not Applicable  Medication Review (RN Care Manager) Complete

## 2022-10-19 NOTE — Assessment & Plan Note (Signed)
Elevated troponin - demand ischemia & delayed clearance with severe AKI. -Cardiology was consulted, signed off --IV heparin stopped 7/1 AM with acute anemia and hematoma --Monitor

## 2022-10-19 NOTE — Consult Note (Addendum)
[facility]  CONSULT NOTE    Date: 10/19/2022                  Patient Name:  Austin Marsh  MRN: [id number]  DOB: [birth year]  Age / Sex: 68 y.o., male         PCP: Koren Bound, NP                 Service Requesting Consult: TRH                 Reason for Consult: Acute kidney injury            History of Present Illness: Mr. Dio Bethell is a 68 y.o.  male with past medical history of hypothyroidism, depression with anxiety, schizoaffective, and COPD, who was admitted to [facility] on 10/16/2022 for Lactic acidosis [E87.20] Elevated troponin level [R79.89] Demand ischemia [I24.89] NSTEMI (non-ST elevated myocardial infarction) (HCC) [I21.4] Acute kidney injury (HCC) [N17.9] Septic shock (HCC) [A41.9, R65.21] Sepsis due to undetermined organism (HCC) [A41.9] Altered mental status, unspecified altered mental status type [R41.82] SIRS (systemic inflammatory response syndrome) (HCC) [R65.10]  Patient presents to the ED after suffering a fall and having altered mental status. Patient is a resident of facility. He is seen and evaluated at bedside in ICU. Alert with difficult speech. Remains confused. HFNC in place. Sodium bicarb infusion. Generalized edema. Foley catheter in place.   Labs on ED arrival significant for sodium 123, serum bicarb 11, glucose 159, creatinine 1.78 with GFR 41, troponin 172, lactic acid greater than 9.0, and hemoglobin 11.5.  UA negative.  Urine tox screen negative.  Blood culture positive for Staphylococcus species, Staphylococcus epidermidis and MRSA.  Chest x-ray negative for acute findings..  Sodium has increased during this admission, 127.  However renal function has continued to decline as well, creatinine 5.14 currently.  Patient also found to have elevated CK, 39,792 on admission.   Medications: Outpatient medications: Medications Prior to Admission  Medication Sig Dispense Refill Last Dose   acetaminophen (TYLENOL) 325 MG  tablet Take 650 mg by mouth every 4 (four) hours as needed.   unk at unk   albuterol (VENTOLIN HFA) 108 (90 Base) MCG/ACT inhaler Inhale 2 puffs into the lungs every 4 (four) hours as needed for wheezing or shortness of breath. 8 g 0 unk at unk   alum & mag hydroxide-simeth (MAALOX/MYLANTA) 200-200-20 MG/5ML suspension Take 30 mLs by mouth daily as needed for indigestion or heartburn.   unk at un   benztropine (COGENTIN) 2 MG tablet Take 2 mg by mouth 2 (two) times daily.   10/16/2022   bismuth subsalicylate (PEPTO BISMOL) 262 MG/15ML suspension Take 15 mLs by mouth every 4 (four) hours as needed.   unk at unk   busPIRone (BUSPAR) 15 MG tablet Take 15 mg by mouth 2 (two) times daily.   10/16/2022   cloZAPine (CLOZARIL) 100 MG tablet Take 100-200 mg by mouth 2 (two) times daily. 100 mg qam 200 mg qpm   10/16/2022   divalproex (DEPAKOTE ER) 500 MG 24 hr tablet Take 1,000 mg by mouth at bedtime.   10/16/2022 at 2000   doxazosin (CARDURA) 4 MG tablet Take 4 mg by mouth daily.   10/16/2022   gabapentin (NEURONTIN) 300 MG capsule Take 300 mg by mouth 3 (three) times daily.   10/16/2022   glycopyrrolate (ROBINUL) 1 MG tablet Take 1 mg by mouth daily.   10/16/2022   guaiFENesin (ROBITUSSIN) 100 MG/5ML liquid  Take 200 mg by mouth every 6 (six) hours as needed for cough.   unk at unk   levothyroxine (SYNTHROID) 100 MCG tablet Take 75 mcg by mouth daily before breakfast.   10/16/2022   loperamide (IMODIUM) 2 MG capsule Take 2 mg by mouth as needed for diarrhea or loose stools.   unk at unk   magnesium hydroxide (MILK OF MAGNESIA) 400 MG/5ML suspension Take 30 mLs by mouth daily as needed for mild constipation.   unk at unk   meclizine (ANTIVERT) 25 MG tablet Take 25 mg by mouth 3 (three) times daily as needed for dizziness.   unk at unk   pantoprazole (PROTONIX) 40 MG tablet Take 40 mg by mouth daily.   10/16/2022   sennosides-docusate sodium (SENOKOT-S) 8.6-50 MG tablet Take 1 tablet by mouth in the morning and at  bedtime.   10/16/2022   traZODone (DESYREL) 50 MG tablet Take 50 mg by mouth at bedtime.   10/16/2022   valbenazine (INGREZZA) 80 MG capsule Take 80 mg by mouth daily.   10/16/2022   vitamin B-12 (CYANOCOBALAMIN) 100 MCG tablet Take 100 mcg by mouth daily.   10/16/2022   Vitamin D, Cholecalciferol, 25 MCG (1000 UT) TABS Take 50 mcg by mouth daily.    10/16/2022    Current medications: Current Facility-Administered Medications  Medication Dose Route Frequency Provider Last Rate Last Admin   acetaminophen (TYLENOL) tablet 650 mg  650 mg Oral Q6H PRN Lorretta Harp, MD       Or   acetaminophen (TYLENOL) suppository 650 mg  650 mg Rectal Q6H PRN Lorretta Harp, MD       albuterol (PROVENTIL) (2.5 MG/3ML) 0.083% nebulizer solution 2.5 mg  2.5 mg Nebulization Q4H PRN Lorretta Harp, MD       alum & mag hydroxide-simeth (MAALOX/MYLANTA) 200-200-20 MG/5ML suspension 30 mL  30 mL Oral Daily PRN Lorretta Harp, MD       azithromycin (ZITHROMAX) 500 mg in sodium chloride 0.9 % 250 mL IVPB  500 mg Intravenous Q24H Raechel Chute, MD   Stopped at 10/18/22 1724   benztropine mesylate (COGENTIN) injection 2 mg  2 mg Intramuscular BID Rust-Chester, Cecelia Byars, NP   2 mg at 10/19/22 1610   Or   benztropine (COGENTIN) tablet 2 mg  2 mg Oral BID Rust-Chester, Cecelia Byars, NP       bismuth subsalicylate (PEPTO BISMOL) 262 MG/15ML suspension 15 mL  15 mL Oral Q4H PRN Lorretta Harp, MD       busPIRone (BUSPAR) tablet 15 mg  15 mg Oral BID Lorretta Harp, MD   15 mg at 10/17/22 2202   Chlorhexidine Gluconate Cloth 2 % PADS 6 each  6 each Topical Daily Esaw Grandchild A, DO   6 each at 10/18/22 1052   cholecalciferol (VITAMIN D3) 25 MCG (1000 UNIT) tablet 2,000 Units  2,000 Units Oral Daily Lorretta Harp, MD   2,000 Units at 10/17/22 1014   cloZAPine (CLOZARIL) tablet 100 mg  100 mg Oral Daily Lorretta Harp, MD   100 mg at 10/17/22 1013   And   cloZAPine (CLOZARIL) tablet 200 mg  200 mg Oral QHS Lorretta Harp, MD   200 mg at 10/17/22 2202   finasteride  (PROSCAR) tablet 5 mg  5 mg Oral Daily Lorretta Harp, MD   5 mg at 10/17/22 1356   glycopyrrolate (ROBINUL) tablet 1 mg  1 mg Oral Daily Lorretta Harp, MD   1 mg at 10/17/22 1013   guaiFENesin (ROBITUSSIN)  100 MG/5ML liquid 200 mg  200 mg Oral Q6H PRN Lorretta Harp, MD       levothyroxine (SYNTHROID) tablet 75 mcg  75 mcg Oral Q0600 Lorretta Harp, MD   75 mcg at 10/18/22 0530   linezolid (ZYVOX) IVPB 600 mg  600 mg Intravenous Q12H Lorretta Harp, MD   Stopped at 10/19/22 1030   loperamide (IMODIUM) capsule 2 mg  2 mg Oral PRN Lorretta Harp, MD       LORazepam (ATIVAN) injection 0.5-1 mg  0.5-1 mg Intravenous Q4H PRN Esaw Grandchild A, DO   0.5 mg at 10/19/22 1525   magnesium hydroxide (MILK OF MAGNESIA) suspension 30 mL  30 mL Oral Daily PRN Lorretta Harp, MD       magnesium hydroxide (MILK OF MAGNESIA) suspension 30 mL  30 mL Oral Daily PRN Lorretta Harp, MD       meclizine (ANTIVERT) tablet 25 mg  25 mg Oral TID PRN Lorretta Harp, MD       methocarbamol (ROBAXIN) tablet 500 mg  500 mg Oral Q8H PRN Lorretta Harp, MD       midodrine (PROAMATINE) tablet 10 mg  10 mg Oral TID WC Lorretta Harp, MD       morphine (PF) 2 MG/ML injection 2 mg  2 mg Intravenous Q4H PRN Esaw Grandchild A, DO   2 mg at 10/19/22 1231   nicotine (NICODERM CQ - dosed in mg/24 hours) patch 21 mg  21 mg Transdermal Daily Lorretta Harp, MD   21 mg at 10/19/22 0922   ondansetron (ZOFRAN) tablet 4 mg  4 mg Oral Q6H PRN Lorretta Harp, MD       Or   ondansetron (ZOFRAN) injection 4 mg  4 mg Intravenous Q6H PRN Lorretta Harp, MD       oxyCODONE-acetaminophen (PERCOCET/ROXICET) 5-325 MG per tablet 1 tablet  1 tablet Oral Q4H PRN Lorretta Harp, MD       pantoprazole (PROTONIX) EC tablet 40 mg  40 mg Oral Daily Lorretta Harp, MD   40 mg at 10/17/22 1013   polyethylene glycol (MIRALAX / GLYCOLAX) packet 17 g  17 g Oral Daily PRN Lorretta Harp, MD       senna-docusate (Senokot-S) tablet 1 tablet  1 tablet Oral BID Lorretta Harp, MD   1 tablet at 10/17/22 2202   sodium bicarbonate 150 mEq  in sterile water 1,150 mL infusion   Intravenous Continuous Ezequiel Essex, NP 75 mL/hr at 10/19/22 1512 Infusion Verify at 10/19/22 1512   sodium chloride tablet 1 g  1 g Oral BID WC Lorretta Harp, MD   1 g at 10/17/22 1628   traZODone (DESYREL) tablet 50 mg  50 mg Oral QHS Lorretta Harp, MD   50 mg at 10/17/22 2202   valbenazine [facility]) capsule 80 mg  80 mg Oral Daily Lorretta Harp, MD   80 mg at 10/17/22 1013   vitamin B-12 (CYANOCOBALAMIN) tablet 100 mcg  100 mcg Oral Daily Lorretta Harp, MD   100 mcg at 10/17/22 1013      Allergies: Allergies  Allergen Reactions   Haldol [Haloperidol Lactate] Other (See Comments)    Tardive diskonesia   Other     [employer] Beans cause patient to vomit   Thorazine [Chlorpromazine] Other (See Comments)    "messes me all up"   Trazodone And Nefazodone Other (See Comments)    "makes me feel like I'm dying."      Past Medical History: Past Medical History:  Diagnosis Date  COPD (chronic obstructive pulmonary disease) (HCC)    Schizoaffective disorder, bipolar type (HCC)    Sleep apnea    Tardive dyskinesia    Thyroid disease      Past Surgical History: Past Surgical History:  Procedure Laterality Date   APPENDECTOMY       Family History: History reviewed. No pertinent family history.   Social History: Social History   Socioeconomic History   Marital status: Married    Spouse name: Not on file   Number of children: Not on file   Years of education: Not on file   Highest education level: Not on file  Occupational History   Not on file  Tobacco Use   Smoking status: Every Day    Types: Cigarettes   Smokeless tobacco: Never  Substance and Sexual Activity   Alcohol use: Not Currently   Drug use: Not Currently   Sexual activity: Not Currently  Other Topics Concern   Not on file  Social History Narrative   Not on file   Social Determinants of Health   Financial Resource Strain: Not on file  Food Insecurity: No Food Insecurity  (10/17/2022)   Hunger Vital Sign    Worried About Running Out of Food in the Last Year: Never true    Ran Out of Food in the Last Year: Never true  Transportation Needs: Not on file  Physical Activity: Not on file  Stress: Not on file  Social Connections: Not on file  Intimate Partner Violence: Not on file     Review of Systems: Review of Systems  Unable to perform ROS: Mental status change    Vital Signs: Blood pressure (!) 116/57, pulse 93, temperature 98.3 F (36.8 C), temperature source Oral, resp. rate 16, height 5\' 9"  (1.753 m), weight 91.5 kg, SpO2 96 %.  Weight trends: Filed Weights   10/16/22 2331 10/17/22 0459 10/18/22 1000  Weight: 90.7 kg 87.9 kg 91.5 kg    Physical Exam: General: Ill-appearing  Head: Normocephalic, atraumatic.  Dry oral mucosal membranes  Eyes: Anicteric  Lungs:  Right-sided wheeze, high flow nasal cannula  Heart: Regular rate and rhythm  Abdomen:  Soft, nontender  Extremities: 1+ peripheral edema.  Neurologic: Alert, moving all four extremities  Skin: No lesions  Access: None     Lab results: Basic Metabolic Panel: Recent Labs  Lab 10/17/22 0500 10/17/22 0725 10/18/22 1135 10/18/22 2122 10/19/22 0210 10/19/22 1426  NA  --    < >  --  127* 127* 127*  K  --    < >  --  4.4 4.4 4.4  CL  --    < >  --  100 98 96*  CO2  --    < >  --  16* 17* 20*  GLUCOSE  --    < >  --  117* 117* 102*  BUN  --    < >  --  33* 33* 39*  CREATININE  --    < >  --  3.90* 4.22* 5.14*  CALCIUM  --    < >  --  7.0* 7.0* 7.1*  MG 2.3  --  1.9  --  1.8  --   PHOS 3.5  --  5.4*  --  5.8*  --    < > = values in this interval not displayed.    Liver Function Tests: Recent Labs  Lab 10/16/22 2344 10/18/22 0655 10/19/22 0210  AST 30 770* 606*  ALT 10 134*  124*  ALKPHOS 44 22* 25*  BILITOT 0.6 0.6 1.0  PROT 6.0* 4.0* 4.3*  ALBUMIN 3.3* 2.3* 2.5*   Recent Labs  Lab 10/16/22 2344  LIPASE 38   No results for input(s): "AMMONIA" in the last 168  hours.  CBC: Recent Labs  Lab 10/16/22 2344 10/18/22 0655 10/18/22 1135 10/18/22 2122 10/19/22 0210 10/19/22 0759 10/19/22 1426  WBC 11.2* 11.2* 10.6* 10.3 10.8* 11.6*  --   NEUTROABS 6.8  --  8.1* 7.4  --   --   --   HGB 11.5* 6.3* 5.4* 10.1* 9.3* 9.0* 9.0*  HCT 34.3* 18.5* 16.3* 28.4* 25.9* 25.2* 24.8*  MCV 96.6 95.9 97.6 87.7 86.6 85.7  --   PLT 165 108* 92* 83* 83* 82*  --     Cardiac Enzymes: Recent Labs  Lab 10/17/22 0938 10/17/22 1647 10/18/22 0655 10/19/22 0210  CKTOTAL 39,792* >50,000* 25,443* 19,573*    BNP: Invalid input(s): "POCBNP"  CBG: Recent Labs  Lab 10/17/22 1737 10/18/22 0836  GLUCAP 121* 172*    Microbiology: Results for orders placed or performed during the hospital encounter of 10/16/22  Blood Culture (routine x 2)     Status: None (Preliminary result)   Collection Time: 10/16/22 11:44 PM   Specimen: BLOOD LEFT ARM  Result Value Ref Range Status   Specimen Description   Final    BLOOD LEFT ARM Performed at [facility], [address] [place], [place] [address]    Special Requests   Final    BOTTLES DRAWN AEROBIC AND ANAEROBIC Blood Culture adequate volume Performed at [facility], [address], [place], [place] [address]    Culture  Setup Time   Final    GRAM POSTIVE ORGANISM AEROBIC BOTTLE ONLY CRITICAL VALUE NOTED.  VALUE IS CONSISTENT WITH PREVIOUSLY REPORTED AND CALLED VALUE. Performed at [facility], [address], [place], [place] [address]    Culture GRAM POSITIVE RODS  Final   Report Status PENDING  Incomplete  Blood Culture (routine x 2)     Status: Abnormal (Preliminary result)   Collection Time: 10/16/22 11:44 PM   Specimen: BLOOD RIGHT ARM  Result Value Ref Range Status   Specimen Description   Final    BLOOD RIGHT ARM Performed at [facility], [address] [place], [place] [address]    Special Requests   Final    BOTTLES DRAWN AEROBIC AND ANAEROBIC Blood Culture  adequate volume Performed at [facility], [address] [place], [place] [address]    Culture  Setup Time   Final    GRAM POSITIVE COCCI ANAEROBIC BOTTLE ONLY CRITICAL RESULT CALLED TO, READ BACK BY AND VERIFIED WITH: NATHAN BELUE @ 2253 10/17/22 [facility] GRAM POSITIVE RODS AEROBIC BOTTLE ONLY CRITICAL RESULT CALLED TO, READ BACK BY AND VERIFIED WITH: JASON [email or web address]  10/19/22 RH Performed at [facility], [address] [place], [place] [address]    Culture STAPHYLOCOCCUS EPIDERMIDIS (A)  Final   Report Status PENDING  Incomplete  SARS Coronavirus 2 by RT PCR (hospital order, performed in [facility] hospital lab) *cepheid single result test* Anterior Nasal Swab     Status: None   Collection Time: 10/16/22 11:44 PM   Specimen: Anterior Nasal Swab  Result Value Ref Range Status   SARS Coronavirus 2 by RT PCR NEGATIVE NEGATIVE Final    Comment: (NOTE) SARS-CoV-2 target nucleic acids are NOT DETECTED.  The SARS-CoV-2 RNA is generally detectable in upper and lower  respiratory specimens during the acute phase of infection. The lowest concentration of SARS-CoV-2 viral copies this assay can detect is 250 copies / mL. A negative result does not preclude SARS-CoV-2 infection and should not be used as the sole basis for treatment or other patient management decisions.  A negative result may occur with improper specimen collection / handling, submission of specimen other than nasopharyngeal swab, presence of viral mutation(s) within the areas targeted by this assay, and inadequate number of viral copies (<250 copies / mL). A negative result must be combined with clinical observations, patient history, and epidemiological information.  Fact Sheet for Patients:   RoadLapTop.co.za  Fact Sheet for Healthcare Providers: http://kim-miller.com/  This test is not yet approved or  cleared by the [place] FDA and has been authorized  for detection and/or diagnosis of SARS-CoV-2 by FDA under an Emergency Use Authorization (EUA).  This EUA will remain in effect (meaning this test can be used) for the duration of the COVID-19 declaration under Section 564(b)(1) of the Act, 21 U.S.C. section 360bbb-3(b)(1), unless the authorization is terminated or revoked sooner.  Performed at [facility], [address] [place], [place] [address]   Blood Culture ID Panel (Reflexed)     Status: Abnormal   Collection Time: 10/16/22 11:44 PM  Result Value Ref Range Status   Enterococcus faecalis NOT DETECTED NOT DETECTED Final   Enterococcus Faecium NOT DETECTED NOT DETECTED Final   Listeria monocytogenes NOT DETECTED NOT DETECTED Final   Staphylococcus species DETECTED (A) NOT DETECTED Final    Comment: CRITICAL RESULT CALLED TO, READ BACK BY AND VERIFIED WITH: NATHAN BELUE @ 2253 10/17/22 [facility]    Staphylococcus aureus (BCID) NOT DETECTED NOT DETECTED Final   Staphylococcus epidermidis DETECTED (A) NOT DETECTED Final    Comment: Methicillin (oxacillin) resistant coagulase negative staphylococcus. Possible blood culture contaminant (unless isolated from more than one blood culture draw or clinical case suggests pathogenicity). No antibiotic treatment is indicated for blood  culture contaminants. CRITICAL RESULT CALLED TO, READ BACK BY AND VERIFIED WITH: NATHAN BELUE @ 2253 10/17/22 [facility]    Staphylococcus lugdunensis NOT DETECTED NOT DETECTED Final   Streptococcus species NOT DETECTED NOT DETECTED Final   Streptococcus agalactiae NOT DETECTED NOT DETECTED Final   Streptococcus pneumoniae NOT DETECTED NOT DETECTED Final   Streptococcus pyogenes NOT DETECTED NOT DETECTED Final   A.calcoaceticus-baumannii NOT DETECTED NOT DETECTED Final   Bacteroides fragilis NOT DETECTED NOT DETECTED Final   Enterobacterales NOT DETECTED NOT DETECTED Final   Enterobacter cloacae complex NOT DETECTED NOT DETECTED Final   Escherichia coli NOT  DETECTED NOT DETECTED Final   Klebsiella aerogenes NOT DETECTED NOT DETECTED Final   Klebsiella oxytoca NOT DETECTED NOT DETECTED Final   Klebsiella pneumoniae NOT DETECTED NOT DETECTED Final   Proteus species NOT DETECTED NOT DETECTED Final   Salmonella species NOT DETECTED NOT DETECTED Final   Serratia marcescens NOT DETECTED NOT DETECTED Final   Haemophilus influenzae NOT DETECTED NOT DETECTED Final   Neisseria meningitidis NOT DETECTED NOT DETECTED Final   Pseudomonas aeruginosa NOT DETECTED NOT DETECTED Final   Stenotrophomonas maltophilia NOT DETECTED NOT DETECTED Final   Candida albicans NOT DETECTED NOT DETECTED Final   Candida auris NOT DETECTED NOT DETECTED Final   Candida glabrata NOT DETECTED NOT DETECTED Final   Candida krusei NOT DETECTED NOT DETECTED Final   Candida parapsilosis NOT DETECTED NOT DETECTED Final   Candida tropicalis NOT DETECTED NOT DETECTED Final   Cryptococcus neoformans/gattii NOT DETECTED NOT  DETECTED Final   Methicillin resistance mecA/C DETECTED (A) NOT DETECTED Final    Comment: CRITICAL RESULT CALLED TO, READ BACK BY AND VERIFIED WITH: NATHAN BELUE @ 2253 10/17/22 [facility] Performed at [facility], [address] [place], [place] [address]   Respiratory (~20 pathogens) panel by PCR     Status: None   Collection Time: 10/17/22  9:26 AM   Specimen: Nasopharyngeal Swab; Respiratory  Result Value Ref Range Status   Adenovirus NOT DETECTED NOT DETECTED Final   Coronavirus 229E NOT DETECTED NOT DETECTED Final    Comment: (NOTE) The Coronavirus on the Respiratory Panel, DOES NOT test for the novel  Coronavirus (2019 nCoV)    Coronavirus HKU1 NOT DETECTED NOT DETECTED Final   Coronavirus NL63 NOT DETECTED NOT DETECTED Final   Coronavirus OC43 NOT DETECTED NOT DETECTED Final   Metapneumovirus NOT DETECTED NOT DETECTED Final   Rhinovirus / Enterovirus NOT DETECTED NOT DETECTED Final   Influenza A NOT DETECTED NOT DETECTED Final   Influenza B  NOT DETECTED NOT DETECTED Final   Parainfluenza Virus 1 NOT DETECTED NOT DETECTED Final   Parainfluenza Virus 2 NOT DETECTED NOT DETECTED Final   Parainfluenza Virus 3 NOT DETECTED NOT DETECTED Final   Parainfluenza Virus 4 NOT DETECTED NOT DETECTED Final   Respiratory Syncytial Virus NOT DETECTED NOT DETECTED Final   Bordetella pertussis NOT DETECTED NOT DETECTED Final   Bordetella Parapertussis NOT DETECTED NOT DETECTED Final   Chlamydophila pneumoniae NOT DETECTED NOT DETECTED Final   Mycoplasma pneumoniae NOT DETECTED NOT DETECTED Final    Comment: Performed at [facility], [address], [place], [place] [address]  MRSA Next Gen by PCR, Nasal     Status: None   Collection Time: 10/18/22 10:53 AM   Specimen: Nasal Mucosa; Nasal Swab  Result Value Ref Range Status   MRSA by PCR Next Gen NOT DETECTED NOT DETECTED Final    Comment: (NOTE) The GeneXpert MRSA Assay (FDA approved for NASAL specimens only), is one component of a comprehensive MRSA colonization surveillance program. It is not intended to diagnose MRSA infection nor to guide or monitor treatment for MRSA infections. Test performance is not FDA approved in patients less than 58 years old. Performed at [facility], [address] [place], [place] [address]     Coagulation Studies: Recent Labs    10/18/22 9147 10/18/22 2122 10/19/22 0210  LABPROT 17.7* 16.3* 16.2*  INR 1.4* 1.3* 1.3*    Urinalysis: Recent Labs    10/16/22 2344  COLORURINE YELLOW*  LABSPEC 1.008  PHURINE 6.0  GLUCOSEU NEGATIVE  HGBUR NEGATIVE  BILIRUBINUR NEGATIVE  KETONESUR NEGATIVE  PROTEINUR NEGATIVE  NITRITE NEGATIVE  LEUKOCYTESUR NEGATIVE      Imaging: CT SOFT TISSUE NECK WO CONTRAST  Result Date: 10/18/2022 CLINICAL DATA:  Left neck swelling EXAM: CT NECK WITHOUT CONTRAST TECHNIQUE: Multidetector CT imaging of the neck was performed following the standard protocol without intravenous contrast. RADIATION DOSE  REDUCTION: This exam was performed according to the departmental dose-optimization program which includes automated exposure control, adjustment of the mA and/or kV according to patient size and/or use of iterative reconstruction technique. COMPARISON:  None Available. FINDINGS: Pharynx and larynx: Normal. No mass or swelling. Salivary glands: No inflammation, mass, or stone. Thyroid: Normal. Lymph nodes: None enlarged or abnormal density. Vascular: Negative. Limited intracranial: Negative. Visualized orbits: Negative. Mastoids and visualized paranasal sinuses: Clear. Skeleton: No acute or aggressive process. Upper chest: Left lung dependent atelectasis. Other: Marked enlargement  of the left trapezius muscle within the neck and upper back with moderate surrounding inflammatory stranding. The left levator scapula is also enlarged. These findings likely indicate intramuscular hematomas. IMPRESSION: Marked enlargement of the left trapezius and levator scapula with surrounding fat stranding, likely intramuscular hematomas. Electronically Signed   By: Deatra Robinson M.D.   On: 10/18/2022 20:55   CT CHEST WO CONTRAST  Result Date: 10/18/2022 CLINICAL DATA:  Left shoulder/neck swelling and ecchymosis, drop in hemoglobin EXAM: CT CHEST WITHOUT CONTRAST TECHNIQUE: Multidetector CT imaging of the chest was performed following the standard protocol without IV contrast. RADIATION DOSE REDUCTION: This exam was performed according to the departmental dose-optimization program which includes automated exposure control, adjustment of the mA and/or kV according to patient size and/or use of iterative reconstruction technique. COMPARISON:  CT chest dated 10/17/2022 FINDINGS: Cardiovascular: The heart is normal in size. No pericardial effusion. No evidence of thoracic aortic aneurysm. Mild atherosclerotic calcifications of the aortic arch. Moderate coronary atherosclerosis of the LAD and right coronary artery. Mediastinum/Nodes: No  suspicious mediastinal lymphadenopathy. Visualized thyroid is unremarkable. Lungs/Pleura: Debris in the right lower lobe bronchus (series 3/image 34), new from recent prior, with new right lower lobe atelectasis/collapse. This appearance favors mucous plugging or possibly interval aspiration. Trace bilateral pleural effusions, new. Mild linear scarring with subpleural reticulation in the lungs bilaterally. Although evaluation is constrained by respiratory motion, there are no suspicious pulmonary nodules. No pneumothorax. Upper Abdomen: Visualized upper abdomen is notable for a carious excretion of contrast, small hepatic cysts, in stable low-density adrenal thickening with a 10 mm adrenal adenoma on the right (series 3/image 133). Musculoskeletal: New intramuscular hematoma with enlargement of the left levator scapulae and trapezius muscles (series 3/image 1), incompletely visualized. Correlate with pending CT neck. Additional subcutaneous stranding in the left supraclavicular region and left upper back, likely related to subcutaneous hemorrhage. Visualized osseous structures are within normal limits. IMPRESSION: New intramuscular hematoma with enlargement of the left levator scapulae and trapezius muscles, incompletely visualized. Additional mild subcutaneous stranding/hemorrhage, as above. Correlate with pending CT neck. Debris in the right lower lobe bronchus, new from recent prior, with new right lower lobe atelectasis/collapse. This appearance favors mucous plugging or possibly interval aspiration. Trace bilateral pleural effusions, new. Aortic Atherosclerosis (ICD10-I70.0). Electronically Signed   By: Charline Bills M.D.   On: 10/18/2022 20:52   US Abdomen Limited RUQ (LIVER/GB)  Result Date: 10/18/2022 CLINICAL DATA:  Liver enzymes EXAM: ULTRASOUND ABDOMEN LIMITED RIGHT UPPER QUADRANT COMPARISON:  CT 10/16/2022 FINDINGS: Gallbladder: No gallstones or wall thickening visualized. No sonographic Murphy  sign noted by sonographer. Common bile duct: Diameter: 4 mm Liver: Diffusely echogenic hepatic parenchyma consistent with fatty liver infiltration. Small hepatic cysts identified measuring 7 x 8 x 7 mm. This is simple appearing. No specific follow-up. Portal vein is patent on color Doppler imaging with normal direction of blood flow towards the liver. Other: None. IMPRESSION: No gallstones or ductal dilatation. Fatty liver infiltration. Hepatic cyst seen. Please correlate with prior CT scan with contrast Electronically Signed   By: Karen Kays M.D.   On: 10/18/2022 16:20   ECHOCARDIOGRAM COMPLETE  Result Date: 10/18/2022    ECHOCARDIOGRAM REPORT   Patient Name:   NAHEIM CRANSON Date of Exam: 10/18/2022 Medical Rec #:  [id number]     Height:       69.0 in Accession #:    [id number]    Weight:       193.8 lb Date of Birth:  01-Jan-1956  BSA:          2.038 m Patient Age:    66 years      BP:           89/55 mmHg Patient Gender: M             HR:           95 bpm. Exam Location:  [facility] Procedure: 2D Echo, Cardiac Doppler and Color Doppler Indications:     NSTEMI I21.4  History:         Patient has no prior history of Echocardiogram examinations.                  COPD; Risk Factors:Sleep Apnea.  Sonographer:     Cristela Blue Referring Phys:  [id number] Brien Few NIU Diagnosing Phys: Marcina Millard MD  Sonographer Comments: No apical window and no subcostal window. Image acquisition challenging due to COPD. IMPRESSIONS  1. Left ventricular ejection fraction, by estimation, is >75%. The left ventricle has hyperdynamic function. The left ventricle has no regional wall motion abnormalities. Left ventricular diastolic parameters were normal.  2. Right ventricular systolic function is normal. The right ventricular size is normal.  3. The mitral valve is normal in structure. Trivial mitral valve regurgitation. No evidence of mitral stenosis.  4. The aortic valve is normal in structure. Aortic valve regurgitation is not visualized.  No aortic stenosis is present.  5. The inferior vena cava is normal in size with greater than 50% respiratory variability, suggesting right atrial pressure of 3 mmHg. FINDINGS  Left Ventricle: Left ventricular ejection fraction, by estimation, is >75%. The left ventricle has hyperdynamic function. The left ventricle has no regional wall motion abnormalities. The left ventricular internal cavity size was normal in size. There is borderline left ventricular hypertrophy. Left ventricular diastolic parameters were normal. Right Ventricle: The right ventricular size is normal. No increase in right ventricular wall thickness. Right ventricular systolic function is normal. Left Atrium: Left atrial size was normal in size. Right Atrium: Right atrial size was normal in size. Pericardium: There is no evidence of pericardial effusion. Mitral Valve: The mitral valve is normal in structure. Trivial mitral valve regurgitation. No evidence of mitral valve stenosis. Tricuspid Valve: The tricuspid valve is normal in structure. Tricuspid valve regurgitation is trivial. No evidence of tricuspid stenosis. Aortic Valve: The aortic valve is normal in structure. Aortic valve regurgitation is not visualized. No aortic stenosis is present. Pulmonic Valve: The pulmonic valve was normal in structure. Pulmonic valve regurgitation is not visualized. No evidence of pulmonic stenosis. Aorta: The aortic root is normal in size and structure. Venous: The inferior vena cava is normal in size with greater than 50% respiratory variability, suggesting right atrial pressure of 3 mmHg. IAS/Shunts: No atrial level shunt detected by color flow Doppler.  LEFT VENTRICLE PLAX 2D LVIDd:         3.40 cm LVIDs:         2.00 cm LV PW:         1.10 cm LV IVS:        1.20 cm LVOT diam:     1.85 cm LVOT Area:     2.69 cm  LEFT ATRIUM         Index LA diam:    2.40 cm 1.18 cm/m   AORTA Ao Root diam: 2.50 cm  SHUNTS Systemic Diam: 1.85 cm Marcina Millard MD  Electronically signed by Marcina Millard MD Signature Date/Time: 10/18/2022/1:34:51 PM  Final    DG Chest Port 1 View  Result Date: 10/18/2022 CLINICAL DATA:  Respiratory failure EXAM: PORTABLE CHEST 1 VIEW COMPARISON:  Previous studies including the examination of 10/16/2022 FINDINGS: Transverse diameter of heart is increased. Apparent shift of mediastinum to the right may be due to rotation. Increased markings are seen in right lower lung field. Right lateral CP angle is indistinct. Left lateral CP angle is clear. There is no pneumothorax. There is marked soft tissue swelling in the left supraclavicular region. IMPRESSION: Increased markings in right lower lung field may suggest atelectasis/pneumonia. Possible small right pleural effusion. There is soft tissue prominence in left supraclavicular region. Possibility of large hematoma in the left side of neck is not excluded. Please correlate with physical examination findings and consider CT neck. These results will be called to the ordering clinician or representative by the Radiologist Assistant, and communication documented in the PACS or [employer]. Electronically Signed   By: Ernie Avena M.D.   On: 10/18/2022 12:42     Assessment & Plan: Mr. Jhair Feick is a 67 y.o.  male with past medical history of hypothyroidism, depression with anxiety, schizoaffective, and COPD,, who was admitted to [facility] on 10/16/2022 for Lactic acidosis [E87.20] Elevated troponin level [R79.89] Demand ischemia [I24.89] NSTEMI (non-ST elevated myocardial infarction) (HCC) [I21.4] Acute kidney injury (HCC) [N17.9] Septic shock (HCC) [A41.9, R65.21] Sepsis due to undetermined organism (HCC) [A41.9] Altered mental status, unspecified altered mental status type [R41.82] SIRS (systemic inflammatory response syndrome) (HCC) [R65.10]  Acute kidney injury with hyponatremia likely secondary to rhabdomyolysis.  CK greater than 29,000 on admission.  Decreased urine  output noted.  Sodium 123 on admission, now 127. Will trial IV furosemide 80 mg once and monitor patient response.  If undesired results, may consider renal replacement therapy tomorrow.  Agree with IV fluids and continue to avoid nephrotoxic agents and therapies.  Foley catheter in place to monitor I's and O's.  Hepatitis B panel ordered if hemodialysis is needed.    LOS: 2 Wendee Beavers 7/2/20243:44 PM

## 2022-10-19 NOTE — Progress Notes (Addendum)
Desat on 5L, 86%. 02 increased to 10L with no improved sat staying 85-87%. Respiratory amy called, will place back on heated high flow. HFNC 30L, 50% sat 92-93%

## 2022-10-19 NOTE — Progress Notes (Signed)
NAME:  Austin Marsh, MRN:  [id number], DOB:  Sep 07, 1955, LOS: 2 ADMISSION DATE:  10/16/2022, CONSULTATION DATE: 10/18/2022 REFERRING MD: Dr. Denton Lank, CHIEF COMPLAINT: Fall  History of Present Illness:  This is a 67 yo male who presented to [facility] ER on 06/29 via EMS from a care facility following a fall.  At the facility the pt was found on the floor by staff.  Per ER notes EMS reported pt febrile/tachycardia/hypotensive with altered mental status meeting sepsis protocol.  All hx obtained from chart pt currently encephalopathic.    ED Course  Upon arrival to the ER pt remained altered, but baseline unknown.  He did endorse low to mid back pain.  Significant lab results were: Na+ 123/chloride 92/CO2 11/glucose 159/creatinine 1.78/calcium 8.1/anion gap 20/albumin 3.3/troponin 172/lactic acid >9.0/wbc 11.2/hgb 11.5/COVID negative.  CXR negative for acute abnormality.  Code sepsis protocol initiated pt received iv fluid resuscitation, vancomycin, and cefepime.   CT Head negative for acute intracranial abnormality.  CT Abd Pelvis concerning for marked distension of the bladder possibly reflecting changes bladder outlet obstruction.  He was subsequently admitted to the telemetry unit for additional workup and treatment.  See detailed hospital course below under significant events.   CT Head: No acute intracranial abnormality. No calvarial fracture.   CT Chest/Abd/Pelvis: Moderate coronary artery calcification. No acute intrathoracic or intra-abdominal pathology identified. Stable 9 mm right adrenal adenoma. Stable changes of adrenal hyperplasia. Marked distention of the bladder possibly reflecting changes of bladder outlet obstruction. Mild prostatic hypertrophy.  Probable post TURP changes.  Lumbar spine x-ray: Minimal posterior L5-S1 disc space narrowing. Moderate L5-S1 greater than L4-5 facet degenerative changes. Thoracic spine x-ray: Mild multilevel degenerative changes of the upper thoracic spine.     Pertinent  Medical History  COPD Schizoaffective Disorder with Bipolar  OSA (wears CPAP) Tardive Dyskinesia  Hypothyroidism  CKD Stage 3a  Depression  Anxiety   Significant Hospital Events: Including procedures, antibiotic start and stop dates in addition to other pertinent events   06/29: Pt admitted to the telemetry unit following a fall with sepsis, rhabdomyolysis, acute kidney injury superimposed on CKD stage 3a, and acute metabolic encephalopathy  07/01: Pt required transfer to the stepdown unit with acute metabolic encephalopathy, hypotension concerning for acute blood loss, and acute hypoxic respiratory failure secondary to RUL atelectasis.  PCCM team consulted to assist with management  07/2: Pt hgb stabilized following 3 units of pRBC and 1 unit of FFP.  Blood pressure stable and not requiring vasopressors.  Pt tolerating HHFNC.  PCCM team will sign off   Micro Data:   Blood 06/29>>MRSE (1 out of 4 bottles); gram positive cocci anaerobic bottle; gram positive rods aerobic bottle  COVID 06/29>>negative  Resp (~20 pathogens) 06/30>>negative  MRSA 07/01>>negative   Anti-infectives (From admission, onward)    Start     Dose/Rate Route Frequency Ordered Stop   10/18/22 1700  azithromycin (ZITHROMAX) 500 mg in sodium chloride 0.9 % 250 mL IVPB        500 mg 250 mL/hr over 60 Minutes Intravenous Every 24 hours 10/18/22 1535     10/17/22 2200  vancomycin (VANCOREADY) IVPB 1250 mg/250 mL  Status:  Discontinued        1,250 mg 166.7 mL/hr over 90 Minutes Intravenous Every 24 hours 10/17/22 0532 10/17/22 0826   10/17/22 1200  metroNIDAZOLE (FLAGYL) IVPB 500 mg  Status:  Discontinued        500 mg 100 mL/hr over 60 Minutes Intravenous Every 12 hours 10/17/22  1610 10/18/22 0850   10/17/22 1200  ceFEPIme (MAXIPIME) 2 g in sodium chloride 0.9 % 100 mL IVPB  Status:  Discontinued        2 g 200 mL/hr over 30 Minutes Intravenous Every 12 hours 10/17/22 0532 10/18/22 0850   10/17/22  1000  linezolid (ZYVOX) IVPB 600 mg        600 mg 300 mL/hr over 60 Minutes Intravenous Every 12 hours 10/17/22 0826     10/17/22 0615  ceFEPIme (MAXIPIME) 2 g in sodium chloride 0.9 % 100 mL IVPB  Status:  Discontinued        2 g 200 mL/hr over 30 Minutes Intravenous  Once 10/17/22 0520 10/17/22 0525   10/17/22 0615  vancomycin (VANCOCIN) IVPB 1000 mg/200 mL premix  Status:  Discontinued        1,000 mg 200 mL/hr over 60 Minutes Intravenous  Once 10/17/22 0520 10/17/22 0525   10/17/22 0100  metroNIDAZOLE (FLAGYL) IVPB 500 mg        500 mg 100 mL/hr over 60 Minutes Intravenous  Once 10/17/22 0047 10/17/22 0319   10/16/22 2345  ceFEPIme (MAXIPIME) 2 g in sodium chloride 0.9 % 100 mL IVPB        2 g 200 mL/hr over 30 Minutes Intravenous  Once 10/16/22 2333 10/17/22 0024   10/16/22 2345  vancomycin (VANCOCIN) IVPB 1000 mg/200 mL premix  Status:  Discontinued        1,000 mg 200 mL/hr over 60 Minutes Intravenous  Once 10/16/22 2333 10/16/22 2337   10/16/22 2345  vancomycin (VANCOREADY) IVPB 2000 mg/400 mL        2,000 mg 200 mL/hr over 120 Minutes Intravenous  Once 10/16/22 2337 10/17/22 0319      Interim History / Subjective:  Pt currently on HHFNC on 30L/40% tolerating well.  BP stable not requiring vasopressors   Objective   Blood pressure 133/66, pulse 98, temperature 97.9 F (36.6 C), resp. rate (!) 30, height 5\' 9"  (1.753 m), weight 91.5 kg, SpO2 95 %.    FiO2 (%):  [40 %-100 %] 40 %   Intake/Output Summary (Last 24 hours) at 10/19/2022 0720 Last data filed at 10/19/2022 0600 Gross per 24 hour  Intake 3514.49 ml  Output 460 ml  Net 3054.49 ml   Filed Weights   10/16/22 2331 10/17/22 0459 10/18/22 1000  Weight: 90.7 kg 87.9 kg 91.5 kg   Examination: General: Acute on chronically-ill appearing male, NAD on HHFNC  HENT: Supple, no JVD  Lungs: Clear throughout, even, non labored  Cardiovascular: NSR, s1s2, no r/g, 2+ radial/1+ distal pulses, no edema  Abdomen: +BS x4,  obese, soft, non tender, non distended  Extremities: Moves all extremities  Neuro: Alert but confused, following commands, PERRLA  GU: Indwelling foley catheter draining yellow urine   Resolved Hospital Problem list     Assessment & Plan:  #Acute metabolic encephalopathy  Hx: Schizoaffective disorder with bipolar and tardive dyskinesia  CT Head 06/30: No acute intracranial abnormality. No calvarial fracture.  - Correct metabolic derangements  - Once able to tolerate po's continue outpatient benztropine, buspirone, gabapentin, clozapine, and valbenazine   #Elevated troponin secondary to demand ischemia vs. NSTEMI  - Continuous telemetry monitoring  - Heparin gtt discontinued due to acute blood loss anemia  - Troponin peaked at 804 - Continue finasteride   #Hypotension multifactorial: acute blood loss anemia and septic shock~resolved   - Aggressive iv fluid; scheduled midodrine; prn blood product administration and/or levophed gtt to  maintain map >65  #Acute hypoxic respiratory failure secondary to RUL atelectasis/collapse #Mucous plugging vs interval aspiration   Hx: COPD  CT Chest 07/1: Debris in the RLL bronchus with new RLL atelectasis/collapse  concerning for mucous plugging vs interval aspiration  - Continue Bipap for now; pt High Risk for Intubation  - Maintain O2 sats 88% to 92% - Prn bronchodilator therapy  - Aggressive pulmonary hygiene as tolerated  - Follow CXR and ABG  #Acute kidney injury superimposed CKD stage 3a  #Metabolic and lactic acidosis  #Hyponatremia  #Rhabdomyolysis  - Trend BMP, CK, and lactic acid  - Replace electrolytes as indicated  - Monitor UOP - Avoid nephrotoxic medications as able  - Consider nephrology consult   #Elevated liver enzymes secondary to fatty liver and hypotension  - Trend hepatic function panel  - Avoid hepatotoxic medications  - Will check acute hepatitis panel    #MRSE bacteremia (1 out 4 possible  contaminant) #Pneumonia likely aspiration  - Trend WBC and monitor fever curve  - Trend PCT  - Follow cultures  - Continue abx as outlined above   #Acute blood loss anemia secondary to left neck hematoma  #Thrombocytopenia  CT Soft Tissue Neck 07/1: Marked enlargement of the left trapezius and levator scapula with surrounding fat stranding, likely intramuscular hematomas - Trend CBC  - Monitor for s/sx of bleeding  - Transfuse for hgb <8 and/or active signs of bleeding  - Avoid chemical VTE px; SCD's for now   #Hyperglycemia  - CBG's q4hs  - Sensitive SSI  - Follow hypo/hyperglycemic protocol   #Fall - Fall precautions  - PT/OT once stabilized   Best Practice (right click and "Reselect all SmartList Selections" daily)   Diet/type: NPO DVT prophylaxis: SCD GI prophylaxis: PPI Lines: N/A Foley:  Yes, and it is still needed Code Status:  full code Last date of multidisciplinary goals of care discussion [N/A]  Labs   CBC: Recent Labs  Lab 10/16/22 2344 10/18/22 0655 10/18/22 1135 10/18/22 2122 10/19/22 0210  WBC 11.2* 11.2* 10.6* 10.3 10.8*  NEUTROABS 6.8  --  8.1* 7.4  --   HGB 11.5* 6.3* 5.4* 10.1* 9.3*  HCT 34.3* 18.5* 16.3* 28.4* 25.9*  MCV 96.6 95.9 97.6 87.7 86.6  PLT 165 108* 92* 83* 83*    Basic Metabolic Panel: Recent Labs  Lab 10/17/22 0500 10/17/22 0725 10/17/22 1526 10/18/22 0655 10/18/22 1135 10/18/22 2122 10/19/22 0210  NA  --  125* 126* 124*  --  127* 127*  K  --  4.7 4.5 4.8  --  4.4 4.4  CL  --  99 99 100  --  100 98  CO2  --  19* 19* 16*  --  16* 17*  GLUCOSE  --  101* 109* 177*  --  117* 117*  BUN  --  18 23 30*  --  33* 33*  CREATININE  --  1.60* 1.96* 3.17*  --  3.90* 4.22*  CALCIUM  --  7.7* 7.4* 7.0*  --  7.0* 7.0*  MG 2.3  --   --   --  1.9  --  1.8  PHOS 3.5  --   --   --  5.4*  --  5.8*   GFR: Estimated Creatinine Clearance: 18.7 mL/min (A) (by C-G formula based on SCr of 4.22 mg/dL (H)). Recent Labs  Lab 10/17/22 0853  10/17/22 1138 10/18/22 0655 10/18/22 1135 10/18/22 2122 10/19/22 0210  PROCALCITON  --   --  2.19  --   --  1.72  WBC  --   --  11.2* 10.6* 10.3 10.8*  LATICACIDVEN 2.7* 3.2*  --   --  2.1* 2.4*    Liver Function Tests: Recent Labs  Lab 10/16/22 2344 10/18/22 0655 10/19/22 0210  AST 30 770* 606*  ALT 10 134* 124*  ALKPHOS 44 22* 25*  BILITOT 0.6 0.6 1.0  PROT 6.0* 4.0* 4.3*  ALBUMIN 3.3* 2.3* 2.5*   Recent Labs  Lab 10/16/22 2344  LIPASE 38   No results for input(s): "AMMONIA" in the last 168 hours.  ABG    Component Value Date/Time   PHART 7.3 (L) 10/18/2022 1500   PCO2ART 29 (L) 10/18/2022 1500   PO2ART 73 (L) 10/18/2022 1500   HCO3 18.6 (L) 10/19/2022 0211   ACIDBASEDEF 5.9 (H) 10/19/2022 0211   O2SAT 82.1 10/19/2022 0211     Coagulation Profile: Recent Labs  Lab 10/16/22 2344 10/18/22 0655 10/18/22 2122 10/19/22 0210  INR 1.2 1.4* 1.3* 1.3*    Cardiac Enzymes: Recent Labs  Lab 10/17/22 0938 10/17/22 1647 10/18/22 0655 10/19/22 0210  CKTOTAL 39,792* >50,000* 25,443* 19,573*    HbA1C: Hgb A1c MFr Bld  Date/Time Value Ref Range Status  10/17/2022 07:25 AM 5.7 (H) 4.8 - 5.6 % Final    Comment:    (NOTE)         Prediabetes: 5.7 - 6.4         Diabetes: >6.4         Glycemic control for adults with diabetes: <7.0     CBG: Recent Labs  Lab 10/17/22 1737 10/18/22 0836  GLUCAP 121* 172*    Review of Systems:   Unable to assess pt encephalopathic   Past Medical History:  He,  has a past medical history of COPD (chronic obstructive pulmonary disease) (HCC), Schizoaffective disorder, bipolar type (HCC), Sleep apnea, Tardive dyskinesia, and Thyroid disease.   Surgical History:   Past Surgical History:  Procedure Laterality Date   APPENDECTOMY       Social History:   reports that he has been smoking cigarettes. He has never used smokeless tobacco. He reports that he does not currently use alcohol. He reports that he does not  currently use drugs.   Family History:  His family history is not on file.   Allergies Allergies  Allergen Reactions   Haldol [Haloperidol Lactate] Other (See Comments)    Tardive diskonesia   Other     [employer] cause patient to vomit   Thorazine [Chlorpromazine] Other (See Comments)    "messes me all up"   Trazodone And Nefazodone Other (See Comments)    "makes me feel like I'm dying."     Home Medications  Prior to Admission medications   Medication Sig Start Date End Date Taking? Authorizing Provider  acetaminophen (TYLENOL) 325 MG tablet Take 650 mg by mouth every 4 (four) hours as needed.   Yes [provider]  albuterol (VENTOLIN HFA) 108 (90 Base) MCG/ACT inhaler Inhale 2 puffs into the lungs every 4 (four) hours as needed for wheezing or shortness of breath. 01/23/20  Yes Shaune Pollack, MD  alum & mag hydroxide-simeth (MAALOX/MYLANTA) 200-200-20 MG/5ML suspension Take 30 mLs by mouth daily as needed for indigestion or heartburn.   Yes [provider]  benztropine (COGENTIN) 2 MG tablet Take 2 mg by mouth 2 (two) times daily.   Yes [provider]  bismuth subsalicylate (PEPTO BISMOL) 262 MG/15ML suspension Take 15 mLs by mouth every 4 (four) hours as  needed.   Yes [provider]  busPIRone (BUSPAR) 15 MG tablet Take 15 mg by mouth 2 (two) times daily.   Yes [provider]  cloZAPine (CLOZARIL) 100 MG tablet Take 100-200 mg by mouth 2 (two) times daily. 100 mg qam 200 mg qpm   Yes [provider]  divalproex (DEPAKOTE ER) 500 MG 24 hr tablet Take 1,000 mg by mouth at bedtime.   Yes [provider]  doxazosin (CARDURA) 4 MG tablet Take 4 mg by mouth daily.   Yes [provider]  gabapentin (NEURONTIN) 300 MG capsule Take 300 mg by mouth 3 (three) times daily.   Yes [provider]  glycopyrrolate (ROBINUL) 1 MG tablet Take 1 mg by mouth daily.   Yes [provider]  guaiFENesin  (ROBITUSSIN) 100 MG/5ML liquid Take 200 mg by mouth every 6 (six) hours as needed for cough.   Yes [provider]  levothyroxine (SYNTHROID) 100 MCG tablet Take 75 mcg by mouth daily before breakfast.   Yes [provider]  loperamide (IMODIUM) 2 MG capsule Take 2 mg by mouth as needed for diarrhea or loose stools.   Yes [provider]  magnesium hydroxide (MILK OF MAGNESIA) 400 MG/5ML suspension Take 30 mLs by mouth daily as needed for mild constipation.   Yes [provider]  meclizine (ANTIVERT) 25 MG tablet Take 25 mg by mouth 3 (three) times daily as needed for dizziness.   Yes [provider]  pantoprazole (PROTONIX) 40 MG tablet Take 40 mg by mouth daily.   Yes [provider]  sennosides-docusate sodium (SENOKOT-S) 8.6-50 MG tablet Take 1 tablet by mouth in the morning and at bedtime.   Yes [provider]  traZODone (DESYREL) 50 MG tablet Take 50 mg by mouth at bedtime.   Yes [provider]  valbenazine (INGREZZA) 80 MG capsule Take 80 mg by mouth daily.   Yes [provider]  vitamin B-12 (CYANOCOBALAMIN) 100 MCG tablet Take 100 mcg by mouth daily.   Yes [provider]  Vitamin D, Cholecalciferol, 25 MCG (1000 UT) TABS Take 50 mcg by mouth daily.    Yes [provider]  cephALEXin (KEFLEX) 500 MG capsule Take 1 capsule (500 mg total) by mouth 2 (two) times daily. Patient not taking: Reported on 10/17/2022 05/09/20   Ward, Layla Maw, DO  polyethylene glycol (MIRALAX) 17 g packet Take 17 g by mouth daily. Patient not taking: Reported on 10/17/2022 06/01/21   Georga Hacking, MD     Critical care time: 36 minutes      Zada Girt, AGNP  Pulmonary/Critical Care Pager [phone] (please enter 7 digits) PCCM Consult Pager [phone] (please enter 7 digits)

## 2022-10-19 NOTE — Progress Notes (Signed)
Dr. Denton Lank made aware kidney function is worsening bun 33 creat 4.22. very poor output total of 150cc currently my shift, AKI due to Rhabo. Md placed orders for nephrology consult.

## 2022-10-19 NOTE — Progress Notes (Signed)
SLP Cancellation Note  Patient Details Name: Austin Marsh MRN: [id number] DOB: 12/04/1955   Cancelled treatment:       Reason Eval/Treat Not Completed: Medical issues which prohibited therapy;Fatigue/lethargy limiting ability to participate;Patient not medically ready (chart reviewed; consulted NSG then met briefly w/ pt)  Pt currently lethargic post Ativan medication given by NSG d/t agitation this afternoon. Per chart history, pt does not appear to have a h/o swallowing difficulty; noted his h/o depression with anxiety, Schizoaffective not currently receiving his medications d/t NPO status. Patient is a resident of facility.   NSG reported pt has been on BiPAP w/ increased native secretions. Reviewed his Chest CTs -- one at admit, then a f/u CT yesterday revealing: "Debris in the right lower lobe bronchus series 3/image 34), new from recent prior, with new right lower lobe atelectasis/collapse. This appearance favors mucous  plugging". Other CXR in 2023: "No active disease". Also noted pt's BMI/weight WNL.   ST services will f/u w/ pt's status tomorrow to determine appropriate time for swallow assessment. Recommend oral care w/ aspiration precautions for hygiene and stimulation of swallowing when awake. NSG agreed.     Jerilynn Som, MS, CCC-SLP Speech Language Pathologist Rehab Services; [facility] [phone] (ascom) Nary Sneed 10/19/2022, 4:45 PM

## 2022-10-19 NOTE — Consult Note (Signed)
NAME: Austin Marsh  DOB: Aug 29, 1955  MRN: [id number]  Date/Time: 10/19/2022 1:47 PM  REQUESTING PROVIDER: Dr.Griffith Subjective:  REASON FOR CONSULT: BActeremia ?No history available from patient as he had received ativan for agitation and is somnolent Austin Marsh is a 67 y.o. male with a history of COPD, schizoaffective disorder, bipolar, tardive dyskinesia presented to [facility] ED on 10/16/2022 brought in from the facility after a fall. In the ED vitals BP 150/117, heart rate 123, respiratory 22, temperature 101, lactate was more than 9, sodium 123, creatinine 1.78, WBC 11.2, hemoglobin 11.5 CK was 50,000  found to have acute urinary retention and bladder scan showed 973 cc of residual volume and a Foley catheter was placed.  He was found to have a fever of 101 in the ED.  He was also complaining of pain in his legs and so underwent CT of the head which was negative for intracranial abnormalities.  X-ray of the bilateral hips and pelvis was negative for fracture.  X-ray of the lumbar spine and T-spine negative for acute fracture.  Patient was admitted to progressive care unit. Blood culture was sent.  On 10/18/2022 he was hypotensive requiring IV fluid bolus.  He was having worsening encephalopathy.  Hemoglobin dropped to 6.3 and there was a large area of ecchymosis and swelling in the left neck and upper shoulder concerning for hematoma.  So he was transfused 2 units of PRBC and 1 frozen plasma.  Patient initially was started on vancomycin cefepime and Flagyl.  His blood culture came back positive for gram-positive bacteremia and the vancomycin and cefepime were discontinued and he was placed on linezolid and azithromycin for possible pneumonia It was reported as Staph epidermidis. Now there is gram-positive rods in aerobic bottle and I am asked to see the patient.  As per his nurse patient was agitated this evening and received Ativan and now he is sedated and sleeping and I am not able to get any  history from him.   Past Medical History:  Diagnosis Date   COPD (chronic obstructive pulmonary disease) (HCC)    Schizoaffective disorder, bipolar type (HCC)    Sleep apnea    Tardive dyskinesia    Thyroid disease     Past Surgical History:  Procedure Laterality Date   APPENDECTOMY      Social History   Socioeconomic History   Marital status: Married    Spouse name: Not on file   Number of children: Not on file   Years of education: Not on file   Highest education level: Not on file  Occupational History   Not on file  Tobacco Use   Smoking status: Every Day    Types: Cigarettes   Smokeless tobacco: Never  Substance and Sexual Activity   Alcohol use: Not Currently   Drug use: Not Currently   Sexual activity: Not Currently  Other Topics Concern   Not on file  Social History Narrative   Not on file   Social Determinants of Health   Financial Resource Strain: Not on file  Food Insecurity: No Food Insecurity (10/17/2022)   Hunger Vital Sign    Worried About Running Out of Food in the Last Year: Never true    Ran Out of Food in the Last Year: Never true  Transportation Needs: Not on file  Physical Activity: Not on file  Stress: Not on file  Social Connections: Not on file  Intimate Partner Violence: Not on file    History reviewed. No pertinent family  history. Allergies  Allergen Reactions   Haldol [Haloperidol Lactate] Other (See Comments)    Tardive diskonesia   Other     [employer] cause patient to vomit   Thorazine [Chlorpromazine] Other (See Comments)    "messes me all up"   Trazodone And Nefazodone Other (See Comments)    "makes me feel like I'm dying."   I? Current Facility-Administered Medications  Medication Dose Route Frequency Provider Last Rate Last Admin   acetaminophen (TYLENOL) tablet 650 mg  650 mg Oral Q6H PRN Lorretta Harp, MD       Or   acetaminophen (TYLENOL) suppository 650 mg  650 mg Rectal Q6H PRN Lorretta Harp, MD       albuterol  (PROVENTIL) (2.5 MG/3ML) 0.083% nebulizer solution 2.5 mg  2.5 mg Nebulization Q4H PRN Lorretta Harp, MD       alum & mag hydroxide-simeth (MAALOX/MYLANTA) 200-200-20 MG/5ML suspension 30 mL  30 mL Oral Daily PRN Lorretta Harp, MD       azithromycin (ZITHROMAX) 500 mg in sodium chloride 0.9 % 250 mL IVPB  500 mg Intravenous Q24H Raechel Chute, MD   Stopped at 10/18/22 1724   benztropine mesylate (COGENTIN) injection 2 mg  2 mg Intramuscular BID Rust-Chester, Cecelia Byars, NP   2 mg at 10/19/22 0865   Or   benztropine (COGENTIN) tablet 2 mg  2 mg Oral BID Rust-Chester, Cecelia Byars, NP       bismuth subsalicylate (PEPTO BISMOL) 262 MG/15ML suspension 15 mL  15 mL Oral Q4H PRN Lorretta Harp, MD       busPIRone (BUSPAR) tablet 15 mg  15 mg Oral BID Lorretta Harp, MD   15 mg at 10/17/22 2202   Chlorhexidine Gluconate Cloth 2 % PADS 6 each  6 each Topical Daily Esaw Grandchild A, DO   6 each at 10/18/22 1052   cholecalciferol (VITAMIN D3) 25 MCG (1000 UNIT) tablet 2,000 Units  2,000 Units Oral Daily Lorretta Harp, MD   2,000 Units at 10/17/22 1014   cloZAPine (CLOZARIL) tablet 100 mg  100 mg Oral Daily Lorretta Harp, MD   100 mg at 10/17/22 1013   And   cloZAPine (CLOZARIL) tablet 200 mg  200 mg Oral QHS Lorretta Harp, MD   200 mg at 10/17/22 2202   finasteride (PROSCAR) tablet 5 mg  5 mg Oral Daily Lorretta Harp, MD   5 mg at 10/17/22 1356   glycopyrrolate (ROBINUL) tablet 1 mg  1 mg Oral Daily Lorretta Harp, MD   1 mg at 10/17/22 1013   guaiFENesin (ROBITUSSIN) 100 MG/5ML liquid 200 mg  200 mg Oral Q6H PRN Lorretta Harp, MD       levothyroxine (SYNTHROID) tablet 75 mcg  75 mcg Oral Q0600 Lorretta Harp, MD   75 mcg at 10/18/22 0530   linezolid (ZYVOX) IVPB 600 mg  600 mg Intravenous Q12H Lorretta Harp, MD 300 mL/hr at 10/19/22 0930 600 mg at 10/19/22 0930   loperamide (IMODIUM) capsule 2 mg  2 mg Oral PRN Lorretta Harp, MD       LORazepam (ATIVAN) injection 0.5-1 mg  0.5-1 mg Intravenous Q4H PRN Esaw Grandchild A, DO   0.5 mg at 10/19/22 0145    magnesium hydroxide (MILK OF MAGNESIA) suspension 30 mL  30 mL Oral Daily PRN Lorretta Harp, MD       magnesium hydroxide (MILK OF MAGNESIA) suspension 30 mL  30 mL Oral Daily PRN Lorretta Harp, MD       meclizine (ANTIVERT) tablet  25 mg  25 mg Oral TID PRN Lorretta Harp, MD       methocarbamol (ROBAXIN) tablet 500 mg  500 mg Oral Q8H PRN Lorretta Harp, MD       midodrine (PROAMATINE) tablet 10 mg  10 mg Oral TID WC Lorretta Harp, MD       morphine (PF) 2 MG/ML injection 2 mg  2 mg Intravenous Q4H PRN Esaw Grandchild A, DO   2 mg at 10/19/22 1231   nicotine (NICODERM CQ - dosed in mg/24 hours) patch 21 mg  21 mg Transdermal Daily Lorretta Harp, MD   21 mg at 10/19/22 0922   ondansetron (ZOFRAN) tablet 4 mg  4 mg Oral Q6H PRN Lorretta Harp, MD       Or   ondansetron [facility]) injection 4 mg  4 mg Intravenous Q6H PRN Lorretta Harp, MD       oxyCODONE-acetaminophen (PERCOCET/ROXICET) 5-325 MG per tablet 1 tablet  1 tablet Oral Q4H PRN Lorretta Harp, MD       pantoprazole (PROTONIX) EC tablet 40 mg  40 mg Oral Daily Lorretta Harp, MD   40 mg at 10/17/22 1013   polyethylene glycol (MIRALAX / GLYCOLAX) packet 17 g  17 g Oral Daily PRN Lorretta Harp, MD       senna-docusate (Senokot-S) tablet 1 tablet  1 tablet Oral BID Lorretta Harp, MD   1 tablet at 10/17/22 2202   sodium bicarbonate 150 mEq in sterile water 1,150 mL infusion   Intravenous Continuous Ezequiel Essex, NP 75 mL/hr at 10/19/22 0925 Infusion Verify at 10/19/22 0925   sodium chloride tablet 1 g  1 g Oral BID WC Lorretta Harp, MD   1 g at 10/17/22 1628   traZODone (DESYREL) tablet 50 mg  50 mg Oral QHS Lorretta Harp, MD   50 mg at 10/17/22 2202   valbenazine (INGREZZA) capsule 80 mg  80 mg Oral Daily Lorretta Harp, MD   80 mg at 10/17/22 1013   vitamin B-12 (CYANOCOBALAMIN) tablet 100 mcg  100 mcg Oral Daily Lorretta Harp, MD   100 mcg at 10/17/22 1013     Abtx:  Anti-infectives (From admission, onward)    Start     Dose/Rate Route Frequency Ordered Stop   10/18/22 1700   azithromycin (ZITHROMAX) 500 mg in sodium chloride 0.9 % 250 mL IVPB        500 mg 250 mL/hr over 60 Minutes Intravenous Every 24 hours 10/18/22 1535     10/17/22 2200  vancomycin (VANCOREADY) IVPB 1250 mg/250 mL  Status:  Discontinued        1,250 mg 166.7 mL/hr over 90 Minutes Intravenous Every 24 hours 10/17/22 0532 10/17/22 0826   10/17/22 1200  metroNIDAZOLE (FLAGYL) IVPB 500 mg  Status:  Discontinued        500 mg 100 mL/hr over 60 Minutes Intravenous Every 12 hours 10/17/22 0520 10/18/22 0850   10/17/22 1200  ceFEPIme (MAXIPIME) 2 g in sodium chloride 0.9 % 100 mL IVPB  Status:  Discontinued        2 g 200 mL/hr over 30 Minutes Intravenous Every 12 hours 10/17/22 0532 10/18/22 0850   10/17/22 1000  linezolid (ZYVOX) IVPB 600 mg        600 mg 300 mL/hr over 60 Minutes Intravenous Every 12 hours 10/17/22 0826     10/17/22 0615  ceFEPIme (MAXIPIME) 2 g in sodium chloride 0.9 % 100 mL IVPB  Status:  Discontinued  2 g 200 mL/hr over 30 Minutes Intravenous  Once 10/17/22 0520 10/17/22 0525   10/17/22 0615  vancomycin (VANCOCIN) IVPB 1000 mg/200 mL premix  Status:  Discontinued        1,000 mg 200 mL/hr over 60 Minutes Intravenous  Once 10/17/22 0520 10/17/22 0525   10/17/22 0100  metroNIDAZOLE (FLAGYL) IVPB 500 mg        500 mg 100 mL/hr over 60 Minutes Intravenous  Once 10/17/22 0047 10/17/22 0319   10/16/22 2345  ceFEPIme (MAXIPIME) 2 g in sodium chloride 0.9 % 100 mL IVPB        2 g 200 mL/hr over 30 Minutes Intravenous  Once 10/16/22 2333 10/17/22 0024   10/16/22 2345  vancomycin (VANCOCIN) IVPB 1000 mg/200 mL premix  Status:  Discontinued        1,000 mg 200 mL/hr over 60 Minutes Intravenous  Once 10/16/22 2333 10/16/22 2337   10/16/22 2345  vancomycin (VANCOREADY) IVPB 2000 mg/400 mL        2,000 mg 200 mL/hr over 120 Minutes Intravenous  Once 10/16/22 2337 10/17/22 0319       REVIEW OF SYSTEMS:  NA Objective:  VITALS:  BP 120/61   Pulse 92   Temp 98 F (36.7  C) (Oral)   Resp (!) 29   Ht 5\' 9"  (1.753 m)   Wt 91.5 kg   SpO2 92%   BMI 29.79 kg/m   PHYSICAL EXAM:  General: Somnolent.  Was not arousable Head: Normocephalic, without obvious abnormality, atraumatic. Eyes: Conjunctivae clear, anicteric sclerae. Pupils are equal ENT cannot examine  Neck: , symmetrical, no adenopathy, thyroid: non tender no carotid bruit and no JVD. Severe bruising and ecchymosis over the left supra scapular area   Lungs: Bilateral air entry.  Decreased in the bases Heart: Tachycardia Abdomen: Soft, non-tender,not distended. Bowel sounds normal. No masses Extremities: atraumatic, no cyanosis. No edema. No clubbing Skin: No rashes or lesions. Or bruising Lymph: Cervical, supraclavicular normal. Neurologic: Cannot be assessed Lab Results CBC    Component Value Date/Time   WBC 11.6 (H) 10/19/2022 0759   RBC 2.94 (L) 10/19/2022 0759   HGB 9.0 (L) 10/19/2022 0759   HGB 13.0 02/21/2012 0723   HCT 25.2 (L) 10/19/2022 0759   HCT 38.5 (L) 02/21/2012 0723   PLT 82 (L) 10/19/2022 0759   PLT 178 02/21/2012 0723   MCV 85.7 10/19/2022 0759   MCV 96 02/21/2012 0723   MCH 30.6 10/19/2022 0759   MCHC 35.7 10/19/2022 0759   RDW 15.7 (H) 10/19/2022 0759   RDW 14.2 02/21/2012 0723   LYMPHSABS 1.2 10/18/2022 2122   LYMPHSABS 3.0 02/21/2012 0723   MONOABS 1.5 (H) 10/18/2022 2122   MONOABS 1.2 (H) 02/21/2012 0723   EOSABS 0.0 10/18/2022 2122   EOSABS 0.2 02/21/2012 0723   BASOSABS 0.0 10/18/2022 2122   BASOSABS 0.1 02/21/2012 0723       Latest Ref Rng & Units 10/19/2022    2:10 AM 10/18/2022    9:22 PM 10/18/2022    6:55 AM  CMP  Glucose 70 - 99 mg/dL 161  096  045   BUN 8 - 23 mg/dL 33  33  30   Creatinine 0.61 - 1.24 mg/dL 4.09  8.11  9.14   Sodium 135 - 145 mmol/L 127  127  124   Potassium 3.5 - 5.1 mmol/L 4.4  4.4  4.8   Chloride 98 - 111 mmol/L 98  100  100   CO2 22 -  32 mmol/L 17  16  16    Calcium 8.9 - 10.3 mg/dL 7.0  7.0  7.0   Total Protein 6.5 - 8.1  g/dL 4.3   4.0   Total Bilirubin 0.3 - 1.2 mg/dL 1.0   0.6   Alkaline Phos 38 - 126 U/L 25   22   AST 15 - 41 U/L 606   770   ALT 0 - 44 U/L 124   134       Microbiology: Recent Results (from the past 240 hour(s))  Blood Culture (routine x 2)     Status: None (Preliminary result)   Collection Time: 10/16/22 11:44 PM   Specimen: BLOOD LEFT ARM  Result Value Ref Range Status   Specimen Description   Final    BLOOD LEFT ARM Performed at [facility], [address], [place], [place] [address]    Special Requests   Final    BOTTLES DRAWN AEROBIC AND ANAEROBIC Blood Culture adequate volume Performed at [facility], [address] [place], [place] [address]    Culture  Setup Time   Final    GRAM POSTIVE ORGANISM AEROBIC BOTTLE ONLY CRITICAL VALUE NOTED.  VALUE IS CONSISTENT WITH PREVIOUSLY REPORTED AND CALLED VALUE. Performed at [facility], [address] [place], [place] [address]    Culture GRAM POSITIVE RODS  Final   Report Status PENDING  Incomplete  Blood Culture (routine x 2)     Status: Abnormal (Preliminary result)   Collection Time: 10/16/22 11:44 PM   Specimen: BLOOD RIGHT ARM  Result Value Ref Range Status   Specimen Description   Final    BLOOD RIGHT ARM Performed at [facility], [address] [place], [place] [address]    Special Requests   Final    BOTTLES DRAWN AEROBIC AND ANAEROBIC Blood Culture adequate volume Performed at [facility], [address] [place], [place] [address]    Culture  Setup Time   Final    GRAM POSITIVE COCCI ANAEROBIC BOTTLE ONLY CRITICAL RESULT CALLED TO, READ BACK BY AND VERIFIED WITH: NATHAN BELUE @ 2253 10/17/22 [facility] GRAM POSITIVE RODS AEROBIC BOTTLE ONLY CRITICAL RESULT CALLED TO, READ BACK BY AND VERIFIED WITH: JASON ROBBINS@0310  10/19/22 RH Performed at [facility], [address] [place], [place] [address]    Culture STAPHYLOCOCCUS EPIDERMIDIS (A)  Final   Report  Status PENDING  Incomplete  SARS Coronavirus 2 by RT PCR (hospital order, performed in [facility] hospital lab) *cepheid single result test* Anterior Nasal Swab     Status: None   Collection Time: 10/16/22 11:44 PM   Specimen: Anterior Nasal Swab  Result Value Ref Range Status   SARS Coronavirus 2 by RT PCR NEGATIVE NEGATIVE Final    Comment: (NOTE) SARS-CoV-2 target nucleic acids are NOT DETECTED.  The SARS-CoV-2 RNA is generally detectable in upper and lower respiratory specimens during the acute phase of infection. The lowest concentration of SARS-CoV-2 viral copies this assay can detect is 250 copies / mL. A negative result does not preclude SARS-CoV-2 infection and should not be used as the sole basis for treatment or other patient management decisions.  A negative result may occur with improper specimen collection / handling, submission of specimen other than nasopharyngeal swab, presence of viral mutation(s) within the areas targeted by this assay, and inadequate number of viral copies (<250 copies / mL). A negative result must be combined with clinical observations, patient history, and epidemiological  information.  Fact Sheet for Patients:   RoadLapTop.co.za  Fact Sheet for Healthcare Providers: http://kim-miller.com/  This test is not yet approved or  cleared by the [place] FDA and has been authorized for detection and/or diagnosis of SARS-CoV-2 by FDA under an Emergency Use Authorization (EUA).  This EUA will remain in effect (meaning this test can be used) for the duration of the COVID-19 declaration under Section 564(b)(1) of the Act, 21 U.S.C. section 360bbb-3(b)(1), unless the authorization is terminated or revoked sooner.  Performed at [facility], [address] [place], [place] [address]   Blood Culture ID Panel (Reflexed)     Status: Abnormal   Collection Time: 10/16/22 11:44 PM  Result Value Ref  Range Status   Enterococcus faecalis NOT DETECTED NOT DETECTED Final   Enterococcus Faecium NOT DETECTED NOT DETECTED Final   Listeria monocytogenes NOT DETECTED NOT DETECTED Final   Staphylococcus species DETECTED (A) NOT DETECTED Final    Comment: CRITICAL RESULT CALLED TO, READ BACK BY AND VERIFIED WITH: NATHAN BELUE @ 2253 10/17/22 [facility]    Staphylococcus aureus (BCID) NOT DETECTED NOT DETECTED Final   Staphylococcus epidermidis DETECTED (A) NOT DETECTED Final    Comment: Methicillin (oxacillin) resistant coagulase negative staphylococcus. Possible blood culture contaminant (unless isolated from more than one blood culture draw or clinical case suggests pathogenicity). No antibiotic treatment is indicated for blood  culture contaminants. CRITICAL RESULT CALLED TO, READ BACK BY AND VERIFIED WITH: NATHAN BELUE @ 2253 10/17/22 [facility]    Staphylococcus lugdunensis NOT DETECTED NOT DETECTED Final   Streptococcus species NOT DETECTED NOT DETECTED Final   Streptococcus agalactiae NOT DETECTED NOT DETECTED Final   Streptococcus pneumoniae NOT DETECTED NOT DETECTED Final   Streptococcus pyogenes NOT DETECTED NOT DETECTED Final   A.calcoaceticus-baumannii NOT DETECTED NOT DETECTED Final   Bacteroides fragilis NOT DETECTED NOT DETECTED Final   Enterobacterales NOT DETECTED NOT DETECTED Final   Enterobacter cloacae complex NOT DETECTED NOT DETECTED Final   Escherichia coli NOT DETECTED NOT DETECTED Final   Klebsiella aerogenes NOT DETECTED NOT DETECTED Final   Klebsiella oxytoca NOT DETECTED NOT DETECTED Final   Klebsiella pneumoniae NOT DETECTED NOT DETECTED Final   Proteus species NOT DETECTED NOT DETECTED Final   Salmonella species NOT DETECTED NOT DETECTED Final   Serratia marcescens NOT DETECTED NOT DETECTED Final   Haemophilus influenzae NOT DETECTED NOT DETECTED Final   Neisseria meningitidis NOT DETECTED NOT DETECTED Final   Pseudomonas aeruginosa NOT DETECTED NOT DETECTED Final    Stenotrophomonas maltophilia NOT DETECTED NOT DETECTED Final   Candida albicans NOT DETECTED NOT DETECTED Final   Candida auris NOT DETECTED NOT DETECTED Final   Candida glabrata NOT DETECTED NOT DETECTED Final   Candida krusei NOT DETECTED NOT DETECTED Final   Candida parapsilosis NOT DETECTED NOT DETECTED Final   Candida tropicalis NOT DETECTED NOT DETECTED Final   Cryptococcus neoformans/gattii NOT DETECTED NOT DETECTED Final   Methicillin resistance mecA/C DETECTED (A) NOT DETECTED Final    Comment: CRITICAL RESULT CALLED TO, READ BACK BY AND VERIFIED WITHDawayne Cirri @ 2253 10/17/22 [facility] Performed at [facility], [address] [place], [place] [address]   Respiratory (~20 pathogens) panel by PCR     Status: None   Collection Time: 10/17/22  9:26 AM   Specimen: Nasopharyngeal Swab; Respiratory  Result Value Ref Range Status   Adenovirus NOT DETECTED NOT DETECTED Final   Coronavirus 229E NOT DETECTED NOT DETECTED Final    Comment: (NOTE) The  Coronavirus on the Respiratory Panel, DOES NOT test for the novel  Coronavirus (2019 nCoV)    Coronavirus HKU1 NOT DETECTED NOT DETECTED Final   Coronavirus NL63 NOT DETECTED NOT DETECTED Final   Coronavirus OC43 NOT DETECTED NOT DETECTED Final   Metapneumovirus NOT DETECTED NOT DETECTED Final   Rhinovirus / Enterovirus NOT DETECTED NOT DETECTED Final   Influenza A NOT DETECTED NOT DETECTED Final   Influenza B NOT DETECTED NOT DETECTED Final   Parainfluenza Virus 1 NOT DETECTED NOT DETECTED Final   Parainfluenza Virus 2 NOT DETECTED NOT DETECTED Final   Parainfluenza Virus 3 NOT DETECTED NOT DETECTED Final   Parainfluenza Virus 4 NOT DETECTED NOT DETECTED Final   Respiratory Syncytial Virus NOT DETECTED NOT DETECTED Final   Bordetella pertussis NOT DETECTED NOT DETECTED Final   Bordetella Parapertussis NOT DETECTED NOT DETECTED Final   Chlamydophila pneumoniae NOT DETECTED NOT DETECTED Final   Mycoplasma pneumoniae NOT  DETECTED NOT DETECTED Final    Comment: Performed at [facility], [address] [place], [place] [address]  MRSA Next Gen by PCR, Nasal     Status: None   Collection Time: 10/18/22 10:53 AM   Specimen: Nasal Mucosa; Nasal Swab  Result Value Ref Range Status   MRSA by PCR Next Gen NOT DETECTED NOT DETECTED Final    Comment: (NOTE) The GeneXpert MRSA Assay (FDA approved for NASAL specimens only), is one component of a comprehensive MRSA colonization surveillance program. It is not intended to diagnose MRSA infection nor to guide or monitor treatment for MRSA infections. Test performance is not FDA approved in patients less than 59 years old. Performed at [facility], [address] [place], [place] [address]     IMAGING RESULTS: CT of the neck shows marked enlargement of the left trapezius and levator scapula with surrounding fat stranding.  Likely intramuscular hematomas CT chest shows debris in the right lower lobe bronchus with new right lower lobe atelectasis/collapse.  Favoring mucous plugging or possible intervention aspiration. I have personally reviewed the films ? Impression/Recommendation Fall with severe rhabdomyolysis. The rhabdomyolysis disproportionate to the fall which is a ground-level fall in group home  Severe rhabdomyolysis causing AKI  AKI combination of prerenal, urinary retention, rhabdomyolysis The rhabdo is improving but creatinine is worsening He also received contrast which could have aggravated it Avoid nephrotoxic medications  Staph epidermidis bacteremia in 1 of bottle and gram-positive rod and another bottle This is very likely contamination. Patient is currently on linezolid Tomorrow we will get the final report and decide on stopping antibiotics.  Severe hematoma causing anemia He received PRBC and FFP  Thrombocytopenia  Bipolar disorder, schizoaffective disorder.  Was on many medications.  Watch out for NMS   Patient  is on azithromycin for possible right lower lobe pneumonia May do Augmentin instead ___________________________________________________ Discussed with care team. Note:  This document was prepared using Dragon voice recognition software and may include unintentional dictation errors.

## 2022-10-20 DIAGNOSIS — R7401 Elevation of levels of liver transaminase levels: Secondary | ICD-10-CM

## 2022-10-20 DIAGNOSIS — A419 Sepsis, unspecified organism: Secondary | ICD-10-CM | POA: Diagnosis not present

## 2022-10-20 DIAGNOSIS — B957 Other staphylococcus as the cause of diseases classified elsewhere: Secondary | ICD-10-CM | POA: Diagnosis not present

## 2022-10-20 DIAGNOSIS — N179 Acute kidney failure, unspecified: Secondary | ICD-10-CM | POA: Diagnosis not present

## 2022-10-20 DIAGNOSIS — R6521 Severe sepsis with septic shock: Secondary | ICD-10-CM | POA: Diagnosis not present

## 2022-10-20 DIAGNOSIS — M6282 Rhabdomyolysis: Secondary | ICD-10-CM | POA: Diagnosis not present

## 2022-10-20 DIAGNOSIS — W1830XD Fall on same level, unspecified, subsequent encounter: Secondary | ICD-10-CM | POA: Diagnosis not present

## 2022-10-20 LAB — CBC
HCT: 25.1 % — ABNORMAL LOW (ref 39.0–52.0)
Hemoglobin: 8.8 g/dL — ABNORMAL LOW (ref 13.0–17.0)
MCH: 30.6 pg (ref 26.0–34.0)
MCHC: 35.1 g/dL (ref 30.0–36.0)
MCV: 87.2 fL (ref 80.0–100.0)
Platelets: 81 10*3/uL — ABNORMAL LOW (ref 150–400)
RBC: 2.88 MIL/uL — ABNORMAL LOW (ref 4.22–5.81)
RDW: 15.6 % — ABNORMAL HIGH (ref 11.5–15.5)
WBC: 11.3 10*3/uL — ABNORMAL HIGH (ref 4.0–10.5)
nRBC: 0.3 % — ABNORMAL HIGH (ref 0.0–0.2)

## 2022-10-20 LAB — HEMOGLOBIN AND HEMATOCRIT, BLOOD
HCT: 25 % — ABNORMAL LOW (ref 39.0–52.0)
HCT: 22 % — ABNORMAL LOW (ref 39.0–52.0)
HCT: 25.1 % — ABNORMAL LOW (ref 39.0–52.0)
Hemoglobin: 8.9 g/dL — ABNORMAL LOW (ref 13.0–17.0)
Hemoglobin: 7.9 g/dL — ABNORMAL LOW (ref 13.0–17.0)
Hemoglobin: 8.9 g/dL — ABNORMAL LOW (ref 13.0–17.0)

## 2022-10-20 LAB — COMPREHENSIVE METABOLIC PANEL
ALT: 120 U/L — ABNORMAL HIGH (ref 0–44)
AST: 480 U/L — ABNORMAL HIGH (ref 15–41)
Albumin: 2.4 g/dL — ABNORMAL LOW (ref 3.5–5.0)
Alkaline Phosphatase: 31 U/L — ABNORMAL LOW (ref 38–126)
Anion gap: 10 (ref 5–15)
BUN: 43 mg/dL — ABNORMAL HIGH (ref 8–23)
CO2: 23 mmol/L (ref 22–32)
Calcium: 7.1 mg/dL — ABNORMAL LOW (ref 8.9–10.3)
Chloride: 94 mmol/L — ABNORMAL LOW (ref 98–111)
Creatinine, Ser: 5.65 mg/dL — ABNORMAL HIGH (ref 0.61–1.24)
GFR, Estimated: 10 mL/min — ABNORMAL LOW (ref >60)
Glucose, Bld: 95 mg/dL (ref 70–99)
Potassium: 4.3 mmol/L (ref 3.5–5.1)
Sodium: 127 mmol/L — ABNORMAL LOW (ref 135–145)
Total Bilirubin: 0.8 mg/dL (ref 0.3–1.2)
Total Protein: 4.6 g/dL — ABNORMAL LOW (ref 6.5–8.1)

## 2022-10-20 LAB — OSMOLALITY: Osmolality: 277 mOsm/kg (ref 275–295)

## 2022-10-20 LAB — LEGIONELLA PNEUMOPHILA SEROGP 1 UR AG: L. pneumophila Serogp 1 Ur Ag: NEGATIVE

## 2022-10-20 LAB — HEPATITIS B SURFACE ANTIGEN: Hepatitis B Surface Ag: NONREACTIVE

## 2022-10-20 LAB — CK: Total CK: 12021 U/L — ABNORMAL HIGH (ref 49–397)

## 2022-10-20 MED ORDER — ORAL CARE MOUTH RINSE: 15.0000 mL | OROMUCOSAL | Status: DC | PRN

## 2022-10-20 MED ORDER — FUROSEMIDE 10 MG/ML IJ SOLN
80.0000 mg | Freq: Once | INTRAMUSCULAR | Status: AC
  Administered 2022-10-20: 80 mg via INTRAVENOUS
  Filled 2022-10-20: qty 8

## 2022-10-20 MED ORDER — ORAL CARE MOUTH RINSE
15.0000 mL | OROMUCOSAL | Status: DC
  Administered 2022-10-20 – 2022-11-04 (×50): 15 mL via OROMUCOSAL

## 2022-10-20 NOTE — Progress Notes (Signed)
Date of Admission:  10/16/2022     ID: Austin Marsh is a 67 y.o. male  Principal Problem:   Septic shock (HCC) Active Problems:   Acute kidney injury (HCC)   COPD (chronic obstructive pulmonary disease) (HCC)   Hypothyroidism   Depression with anxiety   Schizoaffective disorder, bipolar type (HCC)   Tobacco abuse   Fall   BPH (benign prostatic hyperplasia)   Acute renal failure superimposed on stage 3a chronic kidney disease (HCC)   Acute metabolic encephalopathy   Hyponatremia   NSTEMI (non-ST elevated myocardial infarction) (HCC)   Acute retention of urine   Rhabdomyolysis   Altered mental status   Hematoma   Bacteremia due to Staphylococcus   Acute respiratory failure with hypoxia (HCC)    Subjective: Not available  Medications:   benztropine mesylate  2 mg Intramuscular BID   Or   benztropine  2 mg Oral BID   busPIRone  15 mg Oral BID   Chlorhexidine Gluconate Cloth  6 each Topical Daily   cholecalciferol  2,000 Units Oral Daily   cloZAPine  100 mg Oral Daily   And   cloZAPine  200 mg Oral QHS   finasteride  5 mg Oral Daily   glycopyrrolate  1 mg Oral Daily   levothyroxine  75 mcg Oral Q0600   midodrine  10 mg Oral TID WC   nicotine  21 mg Transdermal Daily   mouth rinse  15 mL Mouth Rinse 4 times per day   pantoprazole  40 mg Oral Daily   senna-docusate  1 tablet Oral BID   sodium chloride  1 g Oral BID WC   traZODone  50 mg Oral QHS   valbenazine  80 mg Oral Daily   vitamin B-12  100 mcg Oral Daily    Objective: Vital signs in last 24 hours: Patient Vitals for the past 24 hrs:  BP Temp Temp src Pulse Resp SpO2 Weight  10/20/22 2200 128/72 -- -- 87 (!) 23 98 % --  10/20/22 2124 -- -- -- 89 (!) 24 97 % --  10/20/22 2100 (!) 140/65 -- -- 87 19 98 % --  10/20/22 2000 139/72 98.6 F (37 C) Axillary 97 20 92 % --  10/20/22 1930 -- -- -- 95 (!) 23 94 % --  10/20/22 1900 134/65 -- -- 93 (!) 26 94 % --  10/20/22 1800 (!) 126/97 -- -- 94 (!) 27 91 %  --  10/20/22 1700 126/68 -- -- 91 18 90 % --  10/20/22 1600 124/63 -- -- 88 17 90 % --  10/20/22 1548 -- 98 F (36.7 C) Oral -- -- -- --  10/20/22 1500 (!) 103/55 -- -- 79 (!) 27 98 % --  10/20/22 1400 (!) 98/50 -- -- 80 (!) 24 98 % --  10/20/22 1300 (!) 93/50 (!) 97.1 F (36.2 C) Axillary 80 12 99 % --  10/20/22 1200 (!) 99/50 -- -- 82 13 98 % --  10/20/22 1108 -- -- -- -- -- 94 % --  10/20/22 1100 122/89 -- -- (!) 104 (!) 25 92 % --  10/20/22 1000 (!) 103/59 -- -- 97 (!) 21 96 % --  10/20/22 0900 (!) 131/94 -- -- 94 15 94 % --  10/20/22 0800 130/81 98.4 F (36.9 C) Axillary 97 16 95 % --  10/20/22 0758 -- -- -- -- -- 95 % --  10/20/22 0702 -- -- -- -- -- -- 91.1 kg  10/20/22 0700 126/69 -- --  97 16 93 % --  10/20/22 0600 (!) 139/90 -- -- (!) 103 17 (!) 88 % --  10/20/22 0500 132/72 -- -- 95 16 94 % --  10/20/22 0400 127/66 -- -- 93 13 93 % --  10/20/22 0300 (!) 149/73 -- -- 97 14 97 % --  10/20/22 0200 134/74 98 F (36.7 C) Axillary 93 13 99 % --  10/20/22 0100 135/65 -- -- 89 13 98 % --  10/20/22 0000 (!) 146/65 -- -- 93 16 93 % --  10/19/22 2300 136/65 -- -- 87 (!) 22 98 % --      PHYSICAL EXAM:  General: somnolent but arousable Lungs: b/l air entry Heart: s1s2 Hematoma, bruising left upper scapula area Abdomen: Soft, non-tender,not distended. Bowel sounds normal. No masses Extremities: atraumatic, no cyanosis. No edema. No clubbing Skin: No rashes or lesions. Or bruising Lymph: Cervical, supraclavicular normal. Neurologic: cannot assess  Lab Results    Latest Ref Rng & Units 10/20/2022    8:19 PM 10/20/2022    2:20 PM 10/20/2022    7:55 AM  CBC  Hemoglobin 13.0 - 17.0 g/dL 8.9  7.9  8.9   Hematocrit 39.0 - 52.0 % 25.1  22.0  25.0        Latest Ref Rng & Units 10/20/2022    1:45 AM 10/19/2022    2:26 PM 10/19/2022    2:10 AM  CMP  Glucose 70 - 99 mg/dL 95  829  562   BUN 8 - 23 mg/dL 43  39  33   Creatinine 0.61 - 1.24 mg/dL 1.30  8.65  7.84   Sodium 135 - 145  mmol/L 127  127  127   Potassium 3.5 - 5.1 mmol/L 4.3  4.4  4.4   Chloride 98 - 111 mmol/L 94  96  98   CO2 22 - 32 mmol/L 23  20  17    Calcium 8.9 - 10.3 mg/dL 7.1  7.1  7.0   Total Protein 6.5 - 8.1 g/dL 4.6   4.3   Total Bilirubin 0.3 - 1.2 mg/dL 0.8   1.0   Alkaline Phos 38 - 126 U/L 31   25   AST 15 - 41 U/L 480   606   ALT 0 - 44 U/L 120   124       Microbiology:     Assessment/Plan: Fall with severe rhabdomyolysis. The rhabdomyolysis disproportionate to the fall which is a ground-level fall in group home   Severe rhabdomyolysis causing AKI   AKI combination of prerenal, urinary retention, rhabdomyolysis The rhabdo is improving but creatinine is worsening He also received contrast which could have aggravated it Avoid nephrotoxic medications   Staph epidermidis bacteremia in 1 of bottle and gram-positive rod and another bottle This is very likely contamination. Patient is currently on linezolid Once the final culture is available can decide on stopping antibiotic  Transaminitis- better   Severe hematoma causing anemia He received PRBC and FFP   Thrombocytopenia   Bipolar disorder, schizoaffective disorder.  Was on many medications.      Discussed the management with the care team

## 2022-10-20 NOTE — Progress Notes (Signed)
Progress Note   Patient: Austin Marsh BMW:[id number] DOB: [birth year] DOA: 10/16/2022     3 DOS: the patient was seen and examined on 10/20/2022   Brief hospital course: HPI on admission 10/17/22:  "Austin Marsh is a 67 y.o. male with medical history significant of COPD, hypothyroidism, depression with anxiety, BPH, CKD-3A, schizoaffective disorder with bipolar, tardive dyskinesia, OSA on CPAP, who presents with fall and AMS."  Caregiver at family care facility.  reported pt was in his normal health condition until 10:30 PM yesterday when pt had fall and was found on the floor. At normal baseline, patient is alert oriented x 3 but today is confused.  On admission, pt was lethargic, oriented to person and place, not to year or situation.  He reported right hip pain with moving right leg, low back pain, some chest pain which he was unable to characterize further, constipation, suprapubic pain.  He was found to have acute urinary retention (973 cc on bladder scan).  Foley catheter was placed.    In the ED - fever of 101, HR 123, RR 26, stable O2 sat on room air.  Per EMS report, patient had low BP, but at time of admission BP was stable at 124/73.   Chest x-ray negative.   CT of head negative for acute intracranial abnormalities.   CT Chest/Abd/Pelvis: Moderate coronary artery calcification. No acute intrathoracic or intra-abdominal pathology identified. Stable 9 mm right adrenal adenoma. Stable changes of adrenal hyperplasia. Marked distention of the bladder possibly reflecting changes of bladder outlet obstruction. Mild prostatic hypertrophy.  Probable post TURP changes.  X-ray of bilateral hips/pelvis negative for fracture.   X-ray of L-spine and T-spine negative for acute fracture, did showed degenerative disc disease    Notable labs - WBC 11.2, severe AKI with Cr 7.78, sodium 123, negative COVID PCR, lactic acid> 9.0 ---> 4.9, troponin level 172, 406, 804, 784.  Sodium 123, magnesium 2.3,   Hemoglobin 11.5.  Pt was admitted to PCU. Subsequently found to have severe rhabdomyolysis with CK peaking above 50k. IV heparin was started and cardiology consulted due to troponin elevation.  7/1 -- pt hypotensive, requiring IV fluid boluses.  Worsening encephalopathy, Hbg dropped to 6.3, large area of ecchymosis and swelling in the left neck & upper shoulder concerning for hematoma, later confirmed on CT check and neck soft tissue.  Pt was transferred to stepdown for developing shock. PCCM consulted.  Transfused 2 units pRBC's, 1 FFP.  7/2 -- BP's improved after 3 units pRBC's, 1 FFP total.  Hbg improved from nadir 5.4 to 10.1 >> 9.3 this AM.  +Blood cultures with GPC's and GPR's.  ID consulted.  CK trending down. Nephrology consulted with worsening renal function and poor urine output reported.  Assessment and Plan: * Septic shock (HCC) On admission, tachycardic, tachypneic, febrile. Lactic acid was above 9 meeting septic shock criteria (with dehydration, AKI, rhabdo contributing).  Hypotension seemed more due to acute blood loss 2/2 left neck hematoma.  BP responded to fluids, blood products and midodrine, did not require pressors. Has bacteremia and RLL pneumonia suspect acute aspiration on 7/1 while encephalopathic  --Continue IV antibiotics --Follow cultures --ID consulted for bacteremia --Trend lactate, procal --Monitor fever curve, CBC, hemodynamics  Acute respiratory failure with hypoxia (HCC) Right lower lobe pneumonia, not POA, suspect acute aspiration event on 7/1. --On Linezolid IV for gram+ bacteremia --On Zithromax IV --Continue antibiotics --ID is following due to positive blood cultures  --Wean O2 as tolerated, maintain spO2 >90%  Bacteremia due to Staphylococcus Initial blood cx +staph epidermidis felt contaminant. Now GPC's in anaerobic bottle only, GPR's in aerobic bottle only. --On IV Linezolid for this, IV Zithromax to cover PNA. --Continue current  antibiotics --ID is following  Hematoma Hematoma Left Neck/Shoulder  Acute Blood Loss Anemia Hypotension due to above and ?septic shock 7/1 - Hbg dropped from 11.5 >> 6.3 and pt noted to have new swelling and ecchymosis at the left neck & upper shoulder.  --Heparin was stopped  --Transfused total 3 units pRBC's and 1 FFP --Transferred to stepdown 7/1 --PCCM consulted for pressors if needed - signed off, BP's stabilized --Maintain MAP>65 --Trend Hbg & transfuse if < 7 or signs of ongoing active bleeding  Rhabdomyolysis CK peaked > 50000 Given aggressive IV hydration for this and hypotension, then devoped hypoxia on 7/1, concern for pulmonary edema but found to have RLL collapse. Fluids were held. --Now on bicarb fluids --Trend CK --Nephrology consulted  Acute metabolic encephalopathy CT head on admission negative.  Multifactorial due to metabolic derangements, sepsis / bacteremia, underlying psych conditions --Mgmt of underlying metabolic issues as outlined, antibiotics for PNA --Resume home psychiatric meds once safe to take PO --Delirium precautions --PRN IV Ativan if severe agitation  Acute kidney injury (HCC) Stage 3a CKD: Baseline Cr 1.17 on 06/04/2022.  Cr on admission 7.78 AKI likely due to severe rhabdomyolysis, sepsis, dehydration, urinary retention secondary to bladder outlet obstruction. --Consult nephrology, appreciate recs --Currently on bicarb drip --Nephrology today trying diuresis, but pt might need dialysis tomorrow --Avoid using renal toxic medications --Monitor BMP  Acute retention of urine Foley placed on admission. Hold Cardura given hypotension. Proscar was started on admission Voiding trial when pt is clinically improved Urology follow up  NSTEMI (non-ST elevated myocardial infarction) (HCC) Elevated troponin - demand ischemia & delayed clearance with severe AKI. -Cardiology was consulted, signed off --IV heparin stopped 7/1 AM with acute anemia  and hematoma --Monitor   Fall Unknown cause, pt was found on the floor at his family care home, per caregiver's history on admission. --PT/OT evaluations when clinically improved --Imaging was negative for acute injuries / fractures --Neck hematoma developed 7/1 Confirmed on CT chest and neck soft tissue obtained later on 7/1 PM  COPD (chronic obstructive pulmonary disease) (HCC) Stable -PRN bronchodilators and Mucinex  Hyponatremia Na 127, unchanged. Saline fluids stopped due to hypoxia on 7/1, currently on bicarb drip. --Monitor BMP --Monitor volume status --Neprhology consulted  --Check serum & urine osmolality, TSH, urine sodium, cortisol with AM labs  Hypothyroidism --Synthroid  BPH (benign prostatic hyperplasia) See Acute Urinary Retention  Acute renal failure superimposed on stage 3a chronic kidney disease (HCC) See AKI  Schizoaffective disorder, bipolar type (HCC) Resume home PO meds when able Allergy listed to haldol, will use IV Ativan PRN if agitation  Depression with anxiety -Resume home Benztropine, BuSpar, clozapine, Depakote, Ingrezza when able to take PO meds  Tobacco abuse Nicotine patch ordered  Altered mental status See acute metabolic encephalopathy        Subjective: Pt seen in ICU this AM. He is still encephalopathic, mumbling but no coherent speech. Does follow basic commands including take a deep breath during exam.  No acute events reported.  On more O2 today again.  Worsening renal function.    Physical Exam: Vitals:   10/20/22 1300 10/20/22 1400 10/20/22 1500 10/20/22 1548  BP: (!) 93/50 (!) 98/50 (!) 103/55   Pulse: 80 80 79   Resp: 12 (!) 24 (!) 27  Temp: (!) 97.1 F (36.2 C)   98 F (36.7 C)  TempSrc: Axillary   Oral  SpO2: 99% 98% 98%   Weight:      Height:       General exam: awake, confused, mumbling, no acute distress HEENT: area of ecchymosis and swelling on left neck appears stable, moist mucus membranes, hearing  grossly normal  Respiratory system: CTAB, diminished bases, exam limited by pt mumbling, no wheezes, normal respiratory effort. Cardiovascular system: normal S1/S2, RRR, trace pedal edema.   Gastrointestinal system: soft, NT, ND Central nervous system: very limited exam due to encephalopathy and incoherent speech, follows commands Extremities: mittens on b/l hands, moves all extremities, no edema, normal tone Skin: dry, intact, normal temperature, pale appearing Psychiatry: exam limited by encephalopathy    Data Reviewed:   Notable labs -- Cr rising 5.65, Na 124 >> 127x4,  BUN 43, Ca 7.1, albumin 2.4, AST improving 480, ALT improving 120,  normal Tbili,  CK improved 25443 >> 19573 >> 12021 procal 2.19 >> 1.72  WBC 11.3,  Hbg 6.3 >> 5.4 >> 10.1 >> 9.0 >> 9.0 >> 9.2 >> 8.9 >> 7.9 platelets 81k stable  Normal TSH and free T4 Normal cortisol  Family Communication: Patient's wife, Austin Marsh is a pt on [facility] downstairs.  I updated her in person this afternoon 7/3.  [facility] nursing station number is [phone].   Unable to reach contact Austin Marsh when attempted by phone 7/1 and 7/2.   Disposition: Status is: Inpatient Remains inpatient appropriate because: severity of illness, pt is critically ill with multiple acute issues as above   Planned Discharge Destination: Home    Time spent: 45 minutes   Author: Pennie Banter, DO 10/20/2022 3:49 PM  For on call review [email or web address].

## 2022-10-20 NOTE — Assessment & Plan Note (Signed)
Na 127, unchanged. Saline fluids stopped due to hypoxia on 7/1, currently on bicarb drip. --Monitor BMP --Monitor volume status --Neprhology consulted  --Check serum & urine osmolality, TSH, urine sodium, cortisol with AM labs

## 2022-10-20 NOTE — Evaluation (Signed)
Clinical/Bedside Swallow Evaluation Patient Details  Name: Austin Marsh MRN: [id number] Date of Birth: Oct 02, 1955  Today's Date: 10/20/2022 Time: SLP Start Time (ACUTE ONLY): 0830 SLP Stop Time (ACUTE ONLY): 0925 SLP Time Calculation (min) (ACUTE ONLY): 55 min  Past Medical History:  Past Medical History:  Diagnosis Date   COPD (chronic obstructive pulmonary disease) (HCC)    Schizoaffective disorder, bipolar type (HCC)    Sleep apnea    Tardive dyskinesia    Thyroid disease    Past Surgical History:  Past Surgical History:  Procedure Laterality Date   APPENDECTOMY     HPI:  Pt is a 67 y.o. male with medical history significant of COPD, hypothyroidism, depression with anxiety, BPH, CKD-3A, schizoaffective disorder with bipolar, tardive dyskinesia, OSA on CPAP, who presents with fall and AMS.  Pt resides at a [facility].  Per care giver, at normal baseline, patient is alert oriented x 3.  Upon admit to the ED, the pt was lethargic and confused. Pt admited w/ SIRS and rhabdomyolysis w/ hyponatremia.  He required BIPAP in ICU -- NSG reported concern he may have aspirated secretions/phelgm d/t his thick secretions/congestion.    CT Head: no acute abnormaility.  CT Chest: Debris in the right lower lobe bronchus (series  3/image 34), new from recent prior, with new right lower lobe  atelectasis/collapse. This appearance favors mucous plugging.    Assessment / Plan / Recommendation  Clinical Impression   Pt seen today for BSE. Pt awake, mumbling w/ muttered speech and poor intelligibility. Pt was oriented to his name but nothing else. He did not follow any commands. Pt has been agitated requiring sedating medications yesterday and this morning per NSG.  On HFNC 30L, 40%; afebrile. WBC elevated at 11.3.  Pt appears to present w/ oropharyngeal phase dysphagia in setting of declined Cognitive awareness and overall presentation; illness. ANY Cognitive decline can impact overall  awareness/timing of swallow and safety during po tasks which increases risk for aspiration, choking thus Pulmonary decline.      An oral diet is not safe at this time d/t PROFOUND risk for aspiration.   Pt was presented w/ oral care then (2) trials of ice chips and (1) trial of puree -- all trials placed anteriorly in mouth and/or on lips. No pharyngeal swallow was appreciate via palpation; 2 trials removed/expectorated. Oral phase was c/b POOR oral attention and awareness -- pt made no attempt to lick/sweep up bolus material w/ lingual movements to engage bolus management, A-P transfer/swallow. Bolus residue of puree remained on lips until wiped off by SLP.  OM Exam revealed generalized oral weakness at rest but during oral care, lingual bunching w/ appropriate strength noted. No gross unilateral weakness noted. SEVERE confusion of OM tasks and during oral care noted. Pt often turned his head away.         In setting of current presentation, recommend STRICT NPO status w/ frequent oral care to engage oral stim/swallow and to provide hygiene. ST services will f/u w/ pt's status tomorrow for ongoing assessement for appropriateness for an oral diet. MD/NSG updated, agreed. Precautions posted in room. SLP Visit Diagnosis: Dysphagia, oropharyngeal phase (R13.12) (POOR Cognitive awarness/decline)    Aspiration Risk  Severe aspiration risk;Risk for inadequate nutrition/hydration    Diet Recommendation   NPO w/ frequent oral care for hygiene and stimulation of swallowing  Medication Administration: Via alternative means    Other  Recommendations Recommended Consults:  (Dietician following) Oral Care Recommendations: Oral care QID;Staff/trained caregiver to  provide oral care (for stim/hygiene) Caregiver Recommendations:  (TBD)    Recommendations for follow up therapy are one component of a multi-disciplinary discharge planning process, led by the attending physician.  Recommendations may be updated based  on patient status, additional functional criteria and insurance authorization.  Follow up Recommendations Follow physician's recommendations for discharge plan and follow up therapies      Assistance Recommended at Discharge  full  Functional Status Assessment  (TBD)  Frequency and Duration min 2x/week  2 weeks       Prognosis Prognosis for improved oropharyngeal function: Guarded Barriers to Reach Goals: Cognitive deficits;Language deficits;Time post onset;Severity of deficits;Behavior Barriers/Prognosis Comment: POOR Cognitive awareness/decline currently      Swallow Study   General Date of Onset: 10/17/22 HPI: Pt is a 67 y.o. male with medical history significant of COPD, hypothyroidism, depression with anxiety, BPH, CKD-3A, schizoaffective disorder with bipolar, tardive dyskinesia, OSA on CPAP, who presents with fall and AMS.  Pt resides at a [facility].  Per care giver, at normal baseline, patient is alert oriented x 3.  Upon admit to the ED, the pt was lethargic and confused. Pt admited w/ SIRS and rhabdomyolysis w/ hyponatremia.  He required BIPAP in ICU -- NSG reported concern he may have aspirated secretions/phelgm d/t his thick secretions/congestion.    CT Head: no acute abnormaility.  CT Chest: Debris in the right lower lobe bronchus (series  3/image 34), new from recent prior, with new right lower lobe  atelectasis/collapse. This appearance favors mucous plugging. Type of Study: Bedside Swallow Evaluation Previous Swallow Assessment: none Diet Prior to this Study: NPO Temperature Spikes Noted: No (wbc 11.3) Respiratory Status: Nasal cannula (HFNC 40%; 30L) History of Recent Intubation: No Behavior/Cognition: Alert;Confused;Agitated;Distractible;Requires cueing;Doesn't follow directions (Awake; muttered speech) Oral Cavity Assessment: Within Functional Limits Oral Care Completed by SLP: Yes Oral Cavity - Dentition: Edentulous Vision:  (n/a) Self-Feeding  Abilities: Total assist Patient Positioning: Upright in bed (needed full assistance) Baseline Vocal Quality: Low vocal intensity (muttered/mumbled speech) Volitional Cough: Cognitively unable to elicit Volitional Swallow: Unable to elicit    Oral/Motor/Sensory Function Overall Oral Motor/Sensory Function: Generalized oral weakness   Ice Chips Ice chips: Impaired Presentation: Spoon (fed; 2 trials) Oral Phase Impairments: Reduced lingual movement/coordination;Poor awareness of bolus Oral Phase Functional Implications: Oral residue Pharyngeal Phase Impairments:  (did not engage pharyngeal swallow) Other Comments: bolus expectorated 1/2 trials   Thin Liquid Thin Liquid: Not tested    Nectar Thick Nectar Thick Liquid: Not tested   Honey Thick Honey Thick Liquid: Not tested   Puree Puree: Impaired Presentation: Spoon (fed; 1 trial) Oral Phase Impairments: Reduced lingual movement/coordination;Poor awareness of bolus Oral Phase Functional Implications: Oral residue Pharyngeal Phase Impairments:  (removed)   Solid     Solid: Not tested        Jerilynn Som, MS, CCC-SLP Speech Language Pathologist Rehab Services; [facility] [place] [phone] (ascom) Syla Devoss 10/20/2022,1:04 PM

## 2022-10-20 NOTE — Progress Notes (Signed)
[facility]  ROUNDING NOTE   Subjective:   Patient remains in ICU Remains on HFNC 40% Generalized edema present  UOP in past 24 hours CK 12021 Creatinine 5.65  Objective:  Vital signs in last 24 hours:  Temp:  [97.6 F (36.4 C)-98.4 F (36.9 C)] 98.4 F (36.9 C) (07/03 0800) Pulse Rate:  [75-104] 82 (07/03 1200) Resp:  [12-29] 13 (07/03 1200) BP: (91-153)/(50-94) 99/50 (07/03 1200) SpO2:  [86 %-99 %] 98 % (07/03 1200) FiO2 (%):  [40 %-50 %] 40 % (07/03 1108) Weight:  [91.1 kg] 91.1 kg (07/03 0702)  Weight change:  Filed Weights   10/17/22 0459 10/18/22 1000 10/20/22 0702  Weight: 87.9 kg 91.5 kg 91.1 kg    Intake/Output: I/O last 3 completed shifts: In: 3432.2 [I.V.:2090.6; Blood:136; IV Piggyback:1205.6] Out: 735 [Urine:735]   Intake/Output this shift:  Total I/O In: 1194.1 [I.V.:894.1; IV Piggyback:300] Out: 75 [Urine:75]  Physical Exam: General: Ill appearing  Head: Normocephalic, atraumatic. Dry oral mucosal membranes  Eyes: Anicteric  Lungs:  Wheeze present, HFNC  Heart: Regular rate and rhythm  Abdomen:  Soft, nontender  Extremities:  1+ peripheral edema.  Neurologic: Nonfocal, moving all four extremities  Skin: No lesions  Access: None    Basic Metabolic Panel: Recent Labs  Lab 10/17/22 0500 10/17/22 0725 10/18/22 0655 10/18/22 1135 10/18/22 2122 10/19/22 0210 10/19/22 1426 10/20/22 0145  NA  --    < > 124*  --  127* 127* 127* 127*  K  --    < > 4.8  --  4.4 4.4 4.4 4.3  CL  --    < > 100  --  100 98 96* 94*  CO2  --    < > 16*  --  16* 17* 20* 23  GLUCOSE  --    < > 177*  --  117* 117* 102* 95  BUN  --    < > 30*  --  33* 33* 39* 43*  CREATININE  --    < > 3.17*  --  3.90* 4.22* 5.14* 5.65*  CALCIUM  --    < > 7.0*  --  7.0* 7.0* 7.1* 7.1*  MG 2.3  --   --  1.9  --  1.8  --   --   PHOS 3.5  --   --  5.4*  --  5.8*  --   --    < > = values in this interval not displayed.    Liver Function Tests: Recent Labs   Lab 10/16/22 2344 10/18/22 0655 10/19/22 0210 10/20/22 0145  AST 30 770* 606* 480*  ALT 10 134* 124* 120*  ALKPHOS 44 22* 25* 31*  BILITOT 0.6 0.6 1.0 0.8  PROT 6.0* 4.0* 4.3* 4.6*  ALBUMIN 3.3* 2.3* 2.5* 2.4*   Recent Labs  Lab 10/16/22 2344  LIPASE 38   No results for input(s): "AMMONIA" in the last 168 hours.  CBC: Recent Labs  Lab 10/16/22 2344 10/18/22 0655 10/18/22 1135 10/18/22 2122 10/19/22 0210 10/19/22 0759 10/19/22 1426 10/19/22 1946 10/20/22 0145 10/20/22 0755  WBC 11.2*   < > 10.6* 10.3 10.8* 11.6*  --   --  11.3*  --   NEUTROABS 6.8  --  8.1* 7.4  --   --   --   --   --   --   HGB 11.5*   < > 5.4* 10.1* 9.3* 9.0* 9.0* 9.2* 8.8* 8.9*  HCT 34.3*   < > 16.3*  28.4* 25.9* 25.2* 24.8* 26.5* 25.1* 25.0*  MCV 96.6   < > 97.6 87.7 86.6 85.7  --   --  87.2  --   PLT 165   < > 92* 83* 83* 82*  --   --  81*  --    < > = values in this interval not displayed.    Cardiac Enzymes: Recent Labs  Lab 10/17/22 0938 10/17/22 1647 10/18/22 0655 10/19/22 0210 10/20/22 0145  CKTOTAL 39,792* >50,000* 25,443* 19,573* 12,021*    BNP: Invalid input(s): "POCBNP"  CBG: Recent Labs  Lab 10/17/22 1737 10/18/22 0836  GLUCAP 121* 172*    Microbiology: Results for orders placed or performed during the hospital encounter of 10/16/22  Blood Culture (routine x 2)     Status: None (Preliminary result)   Collection Time: 10/16/22 11:44 PM   Specimen: BLOOD LEFT ARM  Result Value Ref Range Status   Specimen Description   Final    BLOOD LEFT ARM Performed at [facility], [address], [place], [place] [address]    Special Requests   Final    BOTTLES DRAWN AEROBIC AND ANAEROBIC Blood Culture adequate volume Performed at [facility], [address] [place], [place] [address]    Culture  Setup Time   Final    GRAM POSTIVE ORGANISM AEROBIC BOTTLE ONLY CRITICAL VALUE NOTED.  VALUE IS CONSISTENT WITH PREVIOUSLY REPORTED AND CALLED VALUE.     Culture   Final    CULTURE REINCUBATED FOR BETTER GROWTH Performed at [facility], [address] [place], [place] [address]    Report Status PENDING  Incomplete  Blood Culture (routine x 2)     Status: Abnormal (Preliminary result)   Collection Time: 10/16/22 11:44 PM   Specimen: BLOOD RIGHT ARM  Result Value Ref Range Status   Specimen Description   Final    BLOOD RIGHT ARM Performed at [facility], [address], [place], [place] [address]    Special Requests   Final    BOTTLES DRAWN AEROBIC AND ANAEROBIC Blood Culture adequate volume Performed at [facility], [address] [place], [place] [address]    Culture  Setup Time   Final    GRAM POSITIVE COCCI ANAEROBIC BOTTLE ONLY CRITICAL RESULT CALLED TO, READ BACK BY AND VERIFIED WITH: NATHAN BELUE @ 2253 10/17/22 [facility] GRAM POSITIVE RODS AEROBIC BOTTLE ONLY CRITICAL RESULT CALLED TO, READ BACK BY AND VERIFIED WITH: JASON ROBBINS@0310  10/19/22 [facility]    Culture (A)  Final    STAPHYLOCOCCUS EPIDERMIDIS THE SIGNIFICANCE OF ISOLATING THIS ORGANISM FROM A SINGLE SET OF BLOOD CULTURES WHEN MULTIPLE SETS ARE DRAWN IS UNCERTAIN. PLEASE NOTIFY THE MICROBIOLOGY DEPARTMENT WITHIN ONE WEEK IF SPECIATION AND SENSITIVITIES ARE REQUIRED. CULTURE REINCUBATED FOR BETTER GROWTH Performed at [facility], [address] [place], [place] [address]    Report Status PENDING  Incomplete  SARS Coronavirus 2 by RT PCR (hospital order, performed in [facility] hospital lab) *cepheid single result test* Anterior Nasal Swab     Status: None   Collection Time: 10/16/22 11:44 PM   Specimen: Anterior Nasal Swab  Result Value Ref Range Status   SARS Coronavirus 2 by RT PCR NEGATIVE NEGATIVE Final    Comment: (NOTE) SARS-CoV-2 target nucleic acids are NOT DETECTED.  The SARS-CoV-2 RNA is generally detectable in upper and lower respiratory specimens during the acute phase of infection. The lowest concentration of SARS-CoV-2  viral copies this assay can detect  is 250 copies / mL. A negative result does not preclude SARS-CoV-2 infection and should not be used as the sole basis for treatment or other patient management decisions.  A negative result may occur with improper specimen collection / handling, submission of specimen other than nasopharyngeal swab, presence of viral mutation(s) within the areas targeted by this assay, and inadequate number of viral copies (<250 copies / mL). A negative result must be combined with clinical observations, patient history, and epidemiological information.  Fact Sheet for Patients:   RoadLapTop.co.za  Fact Sheet for Healthcare Providers: http://kim-miller.com/  This test is not yet approved or  cleared by the [place] FDA and has been authorized for detection and/or diagnosis of SARS-CoV-2 by FDA under an Emergency Use Authorization (EUA).  This EUA will remain in effect (meaning this test can be used) for the duration of the COVID-19 declaration under Section 564(b)(1) of the Act, 21 U.S.C. section 360bbb-3(b)(1), unless the authorization is terminated or revoked sooner.  Performed at [facility], [address] [place], [place] [address]   Blood Culture ID Panel (Reflexed)     Status: Abnormal   Collection Time: 10/16/22 11:44 PM  Result Value Ref Range Status   Enterococcus faecalis NOT DETECTED NOT DETECTED Final   Enterococcus Faecium NOT DETECTED NOT DETECTED Final   Listeria monocytogenes NOT DETECTED NOT DETECTED Final   Staphylococcus species DETECTED (A) NOT DETECTED Final    Comment: CRITICAL RESULT CALLED TO, READ BACK BY AND VERIFIED WITH: NATHAN BELUE @ 2253 10/17/22 [facility]    Staphylococcus aureus (BCID) NOT DETECTED NOT DETECTED Final   Staphylococcus epidermidis DETECTED (A) NOT DETECTED Final    Comment: Methicillin (oxacillin) resistant coagulase negative staphylococcus. Possible blood  culture contaminant (unless isolated from more than one blood culture draw or clinical case suggests pathogenicity). No antibiotic treatment is indicated for blood  culture contaminants. CRITICAL RESULT CALLED TO, READ BACK BY AND VERIFIED WITH: NATHAN BELUE @ 2253 10/17/22 [facility]    Staphylococcus lugdunensis NOT DETECTED NOT DETECTED Final   Streptococcus species NOT DETECTED NOT DETECTED Final   Streptococcus agalactiae NOT DETECTED NOT DETECTED Final   Streptococcus pneumoniae NOT DETECTED NOT DETECTED Final   Streptococcus pyogenes NOT DETECTED NOT DETECTED Final   A.calcoaceticus-baumannii NOT DETECTED NOT DETECTED Final   Bacteroides fragilis NOT DETECTED NOT DETECTED Final   Enterobacterales NOT DETECTED NOT DETECTED Final   Enterobacter cloacae complex NOT DETECTED NOT DETECTED Final   Escherichia coli NOT DETECTED NOT DETECTED Final   Klebsiella aerogenes NOT DETECTED NOT DETECTED Final   Klebsiella oxytoca NOT DETECTED NOT DETECTED Final   Klebsiella pneumoniae NOT DETECTED NOT DETECTED Final   Proteus species NOT DETECTED NOT DETECTED Final   Salmonella species NOT DETECTED NOT DETECTED Final   Serratia marcescens NOT DETECTED NOT DETECTED Final   Haemophilus influenzae NOT DETECTED NOT DETECTED Final   Neisseria meningitidis NOT DETECTED NOT DETECTED Final   Pseudomonas aeruginosa NOT DETECTED NOT DETECTED Final   Stenotrophomonas maltophilia NOT DETECTED NOT DETECTED Final   Candida albicans NOT DETECTED NOT DETECTED Final   Candida auris NOT DETECTED NOT DETECTED Final   Candida glabrata NOT DETECTED NOT DETECTED Final   Candida krusei NOT DETECTED NOT DETECTED Final   Candida parapsilosis NOT DETECTED NOT DETECTED Final   Candida tropicalis NOT DETECTED NOT DETECTED Final   Cryptococcus neoformans/gattii NOT DETECTED NOT DETECTED Final   Methicillin resistance mecA/C DETECTED (A) NOT DETECTED Final    Comment: CRITICAL RESULT CALLED TO,  READ BACK BY AND VERIFIED  WITHDawayne Cirri @ 2253 10/17/22 [facility] Performed at [facility], [address] [place], [place] [address]   Respiratory (~20 pathogens) panel by PCR     Status: None   Collection Time: 10/17/22  9:26 AM   Specimen: Nasopharyngeal Swab; Respiratory  Result Value Ref Range Status   Adenovirus NOT DETECTED NOT DETECTED Final   Coronavirus 229E NOT DETECTED NOT DETECTED Final    Comment: (NOTE) The Coronavirus on the Respiratory Panel, DOES NOT test for the novel  Coronavirus (2019 nCoV)    Coronavirus HKU1 NOT DETECTED NOT DETECTED Final   Coronavirus NL63 NOT DETECTED NOT DETECTED Final   Coronavirus OC43 NOT DETECTED NOT DETECTED Final   Metapneumovirus NOT DETECTED NOT DETECTED Final   Rhinovirus / Enterovirus NOT DETECTED NOT DETECTED Final   Influenza A NOT DETECTED NOT DETECTED Final   Influenza B NOT DETECTED NOT DETECTED Final   Parainfluenza Virus 1 NOT DETECTED NOT DETECTED Final   Parainfluenza Virus 2 NOT DETECTED NOT DETECTED Final   Parainfluenza Virus 3 NOT DETECTED NOT DETECTED Final   Parainfluenza Virus 4 NOT DETECTED NOT DETECTED Final   Respiratory Syncytial Virus NOT DETECTED NOT DETECTED Final   Bordetella pertussis NOT DETECTED NOT DETECTED Final   Bordetella Parapertussis NOT DETECTED NOT DETECTED Final   Chlamydophila pneumoniae NOT DETECTED NOT DETECTED Final   Mycoplasma pneumoniae NOT DETECTED NOT DETECTED Final    Comment: Performed at [facility], [address] [place], [place] [address]  MRSA Next Gen by PCR, Nasal     Status: None   Collection Time: 10/18/22 10:53 AM   Specimen: Nasal Mucosa; Nasal Swab  Result Value Ref Range Status   MRSA by PCR Next Gen NOT DETECTED NOT DETECTED Final    Comment: (NOTE) The GeneXpert MRSA Assay (FDA approved for NASAL specimens only), is one component of a comprehensive MRSA colonization surveillance program. It is not intended to diagnose MRSA infection nor to guide or monitor treatment for  MRSA infections. Test performance is not FDA approved in patients less than 60 years old. Performed at [facility], [address] [place], [place] [address]     Coagulation Studies: Recent Labs    10/18/22 4696 10/18/22 2122 10/19/22 0210  LABPROT 17.7* 16.3* 16.2*  INR 1.4* 1.3* 1.3*    Urinalysis: No results for input(s): "COLORURINE", "LABSPEC", "PHURINE", "GLUCOSEU", "HGBUR", "BILIRUBINUR", "KETONESUR", "PROTEINUR", "UROBILINOGEN", "NITRITE", "LEUKOCYTESUR" in the last 72 hours.  Invalid input(s): "APPERANCEUR"    Imaging: CT SOFT TISSUE NECK WO CONTRAST  Result Date: 10/18/2022 CLINICAL DATA:  Left neck swelling EXAM: CT NECK WITHOUT CONTRAST TECHNIQUE: Multidetector CT imaging of the neck was performed following the standard protocol without intravenous contrast. RADIATION DOSE REDUCTION: This exam was performed according to the departmental dose-optimization program which includes automated exposure control, adjustment of the mA and/or kV according to patient size and/or use of iterative reconstruction technique. COMPARISON:  None Available. FINDINGS: Pharynx and larynx: Normal. No mass or swelling. Salivary glands: No inflammation, mass, or stone. Thyroid: Normal. Lymph nodes: None enlarged or abnormal density. Vascular: Negative. Limited intracranial: Negative. Visualized orbits: Negative. Mastoids and visualized paranasal sinuses: Clear. Skeleton: No acute or aggressive process. Upper chest: Left lung dependent atelectasis. Other: Marked enlargement of the left trapezius muscle within the neck and upper back with moderate surrounding inflammatory stranding. The left levator scapula is also enlarged. These findings likely indicate intramuscular hematomas. IMPRESSION: Marked enlargement of the left  trapezius and levator scapula with surrounding fat stranding, likely intramuscular hematomas. Electronically Signed   By: Deatra Robinson M.D.   On: 10/18/2022 20:55   CT  CHEST WO CONTRAST  Result Date: 10/18/2022 CLINICAL DATA:  Left shoulder/neck swelling and ecchymosis, drop in hemoglobin EXAM: CT CHEST WITHOUT CONTRAST TECHNIQUE: Multidetector CT imaging of the chest was performed following the standard protocol without IV contrast. RADIATION DOSE REDUCTION: This exam was performed according to the departmental dose-optimization program which includes automated exposure control, adjustment of the mA and/or kV according to patient size and/or use of iterative reconstruction technique. COMPARISON:  CT chest dated 10/17/2022 FINDINGS: Cardiovascular: The heart is normal in size. No pericardial effusion. No evidence of thoracic aortic aneurysm. Mild atherosclerotic calcifications of the aortic arch. Moderate coronary atherosclerosis of the LAD and right coronary artery. Mediastinum/Nodes: No suspicious mediastinal lymphadenopathy. Visualized thyroid is unremarkable. Lungs/Pleura: Debris in the right lower lobe bronchus (series 3/image 34), new from recent prior, with new right lower lobe atelectasis/collapse. This appearance favors mucous plugging or possibly interval aspiration. Trace bilateral pleural effusions, new. Mild linear scarring with subpleural reticulation in the lungs bilaterally. Although evaluation is constrained by respiratory motion, there are no suspicious pulmonary nodules. No pneumothorax. Upper Abdomen: Visualized upper abdomen is notable for a carious excretion of contrast, small hepatic cysts, in stable low-density adrenal thickening with a 10 mm adrenal adenoma on the right (series 3/image 133). Musculoskeletal: New intramuscular hematoma with enlargement of the left levator scapulae and trapezius muscles (series 3/image 1), incompletely visualized. Correlate with pending CT neck. Additional subcutaneous stranding in the left supraclavicular region and left upper back, likely related to subcutaneous hemorrhage. Visualized osseous structures are within normal  limits. IMPRESSION: New intramuscular hematoma with enlargement of the left levator scapulae and trapezius muscles, incompletely visualized. Additional mild subcutaneous stranding/hemorrhage, as above. Correlate with pending CT neck. Debris in the right lower lobe bronchus, new from recent prior, with new right lower lobe atelectasis/collapse. This appearance favors mucous plugging or possibly interval aspiration. Trace bilateral pleural effusions, new. Aortic Atherosclerosis (ICD10-I70.0). Electronically Signed   By: Charline Bills M.D.   On: 10/18/2022 20:52   US Abdomen Limited RUQ (LIVER/GB)  Result Date: 10/18/2022 CLINICAL DATA:  Liver enzymes EXAM: ULTRASOUND ABDOMEN LIMITED RIGHT UPPER QUADRANT COMPARISON:  CT 10/16/2022 FINDINGS: Gallbladder: No gallstones or wall thickening visualized. No sonographic Murphy sign noted by sonographer. Common bile duct: Diameter: 4 mm Liver: Diffusely echogenic hepatic parenchyma consistent with fatty liver infiltration. Small hepatic cysts identified measuring 7 x 8 x 7 mm. This is simple appearing. No specific follow-up. Portal vein is patent on color Doppler imaging with normal direction of blood flow towards the liver. Other: None. IMPRESSION: No gallstones or ductal dilatation. Fatty liver infiltration. Hepatic cyst seen. Please correlate with prior CT scan with contrast Electronically Signed   By: Karen Kays M.D.   On: 10/18/2022 16:20     Medications:    azithromycin Stopped (10/19/22 1705)   linezolid (ZYVOX) IV Stopped (10/20/22 1048)   sodium bicarbonate 150 mEq in sterile water 1,150 mL infusion 75 mL/hr at 10/20/22 1111    benztropine mesylate  2 mg Intramuscular BID   Or   benztropine  2 mg Oral BID   busPIRone  15 mg Oral BID   Chlorhexidine Gluconate Cloth  6 each Topical Daily   cholecalciferol  2,000 Units Oral Daily   cloZAPine  100 mg Oral Daily   And   cloZAPine  200 mg Oral QHS  finasteride  5 mg Oral Daily   glycopyrrolate  1  mg Oral Daily   levothyroxine  75 mcg Oral Q0600   midodrine  10 mg Oral TID WC   nicotine  21 mg Transdermal Daily   mouth rinse  15 mL Mouth Rinse 4 times per day   pantoprazole  40 mg Oral Daily   senna-docusate  1 tablet Oral BID   sodium chloride  1 g Oral BID WC   traZODone  50 mg Oral QHS   valbenazine  80 mg Oral Daily   vitamin B-12  100 mcg Oral Daily   acetaminophen **OR** acetaminophen, albuterol, alum & mag hydroxide-simeth, bismuth subsalicylate, guaiFENesin, loperamide, LORazepam, magnesium hydroxide, magnesium hydroxide, meclizine, methocarbamol, morphine injection, ondansetron **OR** ondansetron (ZOFRAN) IV, mouth rinse, oxyCODONE-acetaminophen, polyethylene glycol  Assessment/ Plan:  Mr. Soloman Dube is a 67 y.o.  male with past medical history of hypothyroidism, depression with anxiety, schizoaffective, and COPD, who was admitted to [facility] on 10/16/2022 for Lactic acidosis [E87.20] Elevated troponin level [R79.89] Demand ischemia [I24.89] NSTEMI (non-ST elevated myocardial infarction) (HCC) [I21.4] Acute kidney injury (HCC) [N17.9] Septic shock (HCC) [A41.9, R65.21] Sepsis due to undetermined organism (HCC) [A41.9] Altered mental status, unspecified altered mental status type [R41.82] SIRS (systemic inflammatory response syndrome) (HCC) [R65.10]   Acute kidney injury with hyponatremia likely secondary to rhabdomyolysis.  CK greater than 29,000 on admission. CK continues to improve. Sodium 123 on admission,remains at 127. IV furosemide  given with recorded. Will order additional dose today. Although creatinine continues to rise, CK is improving. Mentation baseline unknown.  No acute indication for dialysis at this time. Will continue to monitor renal function closely.    Lab Results  Component Value Date   CREATININE 5.65 (H) 10/20/2022   CREATININE 5.14 (H) 10/19/2022   CREATININE 4.22 (H) 10/19/2022    Intake/Output Summary (Last 24 hours) at 10/20/2022  1237 Last data filed at 10/20/2022 1111 Gross per 24 hour  Intake 3070.84 ml  Output 400 ml  Net 2670.84 ml   2. Anemia of acute blood loss:  Lab Results  Component Value Date   HGB 8.9 (L) 10/20/2022  Has received blood transfusions during this admission.   3. Acute respiratory failure requiring HFNC, right lower lobe pneumonia noted with positive blood culture, bacteremia. Receiving Linezolid. ID following.    LOS: 3   7/3/202412:37 PM

## 2022-10-20 NOTE — Assessment & Plan Note (Signed)
Synthroid 

## 2022-10-21 DIAGNOSIS — R6521 Severe sepsis with septic shock: Secondary | ICD-10-CM | POA: Diagnosis not present

## 2022-10-21 DIAGNOSIS — A419 Sepsis, unspecified organism: Secondary | ICD-10-CM | POA: Diagnosis not present

## 2022-10-21 LAB — COMPREHENSIVE METABOLIC PANEL
ALT: 95 U/L — ABNORMAL HIGH (ref 0–44)
AST: 278 U/L — ABNORMAL HIGH (ref 15–41)
Albumin: 1.9 g/dL — ABNORMAL LOW (ref 3.5–5.0)
Alkaline Phosphatase: 39 U/L (ref 38–126)
Anion gap: 13 (ref 5–15)
BUN: 56 mg/dL — ABNORMAL HIGH (ref 8–23)
CO2: 24 mmol/L (ref 22–32)
Calcium: 6.9 mg/dL — ABNORMAL LOW (ref 8.9–10.3)
Chloride: 87 mmol/L — ABNORMAL LOW (ref 98–111)
Creatinine, Ser: 6.98 mg/dL — ABNORMAL HIGH (ref 0.61–1.24)
GFR, Estimated: 8 mL/min — ABNORMAL LOW (ref >60)
Glucose, Bld: 92 mg/dL (ref 70–99)
Potassium: 4.5 mmol/L (ref 3.5–5.1)
Sodium: 124 mmol/L — ABNORMAL LOW (ref 135–145)
Total Bilirubin: 1 mg/dL (ref 0.3–1.2)
Total Protein: 4.4 g/dL — ABNORMAL LOW (ref 6.5–8.1)

## 2022-10-21 LAB — PROCALCITONIN: Procalcitonin: 1.2 ng/mL

## 2022-10-21 LAB — HEPATITIS B SURFACE ANTIBODY, QUANTITATIVE: Hep B S AB Quant (Post): <3.5 m[IU]/mL — ABNORMAL LOW

## 2022-10-21 LAB — CBC
HCT: 24.3 % — ABNORMAL LOW (ref 39.0–52.0)
Hemoglobin: 8.6 g/dL — ABNORMAL LOW (ref 13.0–17.0)
MCH: 30.9 pg (ref 26.0–34.0)
MCHC: 35.4 g/dL (ref 30.0–36.0)
MCV: 87.4 fL (ref 80.0–100.0)
Platelets: 69 10*3/uL — ABNORMAL LOW (ref 150–400)
RBC: 2.78 MIL/uL — ABNORMAL LOW (ref 4.22–5.81)
RDW: 15.2 % (ref 11.5–15.5)
WBC: 10.8 10*3/uL — ABNORMAL HIGH (ref 4.0–10.5)
nRBC: 0.3 % — ABNORMAL HIGH (ref 0.0–0.2)

## 2022-10-21 LAB — HEMOGLOBIN AND HEMATOCRIT, BLOOD
HCT: 24 % — ABNORMAL LOW (ref 39.0–52.0)
HCT: 24.1 % — ABNORMAL LOW (ref 39.0–52.0)
HCT: 24.6 % — ABNORMAL LOW (ref 39.0–52.0)
Hemoglobin: 8.6 g/dL — ABNORMAL LOW (ref 13.0–17.0)
Hemoglobin: 8.6 g/dL — ABNORMAL LOW (ref 13.0–17.0)
Hemoglobin: 8.7 g/dL — ABNORMAL LOW (ref 13.0–17.0)

## 2022-10-21 LAB — CK: Total CK: 5100 U/L — ABNORMAL HIGH (ref 49–397)

## 2022-10-21 NOTE — Progress Notes (Addendum)
0800 No sedation given this am. Patient shouts at times. Patient knows his name and birthday. Cannot carry on a conversation.  1000 Singing jingles from the TV. Happy.  1300 Katherine from speech and swallow in to assess patient. Swallows enough to get most medications in. Given psych medications as ordered. Still singing and ask to play Bingo. 1600 Sleeping soundly.  1800 Repositioned and started to sing. Mitts remain on.

## 2022-10-21 NOTE — Progress Notes (Signed)
[facility]  ROUNDING NOTE   Subjective:   Patient remains in ICU Remains on HFNC but breathing via open mouth Generalized edema present Very lethargic. Responds to voice. No meaningful interaction Has failed swallow evaluation  UOP in past 24 hours CK improving Creatinine worsening  Objective:  Vital signs in last 24 hours:  Temp:  [97.1 F (36.2 C)-98.6 F (37 C)] 98.4 F (36.9 C) (07/04 0200) Pulse Rate:  [79-104] 85 (07/04 0700) Resp:  [12-27] 22 (07/04 0700) BP: (93-145)/(50-112) 123/62 (07/04 0700) SpO2:  [84 %-99 %] 95 % (07/04 0700) FiO2 (%):  [35 %-40 %] 35 % (07/04 0200) Weight:  [71.1 kg] 71.1 kg (07/04 0500)  Weight change:  Filed Weights   10/18/22 1000 10/20/22 0702 10/21/22 0500  Weight: 91.5 kg 91.1 kg 71.1 kg    Intake/Output: I/O last 3 completed shifts: In: 3846.5 [I.V.:2696.5; IV Piggyback:1150] Out: 725 [Urine:725]   Intake/Output this shift:  No intake/output data recorded.  Physical Exam: General: Ill appearing  Head: Normocephalic, atraumatic. Dry oral mucosal membranes  Eyes: Anicteric  Lungs:  Wheeze present, HFNC  Heart: Regular rate and rhythm  Abdomen:  Soft, nontender  Extremities:  2+ generalized edema.  Neurologic: Lethargic, minimally responsive to voice  Skin: Weeping edema  Access: None  Foley in place  Basic Metabolic Panel: Recent Labs  Lab 10/17/22 0500 10/17/22 0725 10/18/22 1135 10/18/22 2122 10/19/22 0210 10/19/22 1426 10/20/22 0145 10/21/22 0405  NA  --    < >  --  127* 127* 127* 127* 124*  K  --    < >  --  4.4 4.4 4.4 4.3 4.5  CL  --    < >  --  100 98 96* 94* 87*  CO2  --    < >  --  16* 17* 20* 23 24  GLUCOSE  --    < >  --  117* 117* 102* 95 92  BUN  --    < >  --  33* 33* 39* 43* 56*  CREATININE  --    < >  --  3.90* 4.22* 5.14* 5.65* 6.98*  CALCIUM  --    < >  --  7.0* 7.0* 7.1* 7.1* 6.9*  MG 2.3  --  1.9  --  1.8  --   --   --   PHOS 3.5  --  5.4*  --  5.8*  --   --   --     < > = values in this interval not displayed.     Liver Function Tests: Recent Labs  Lab 10/16/22 2344 10/18/22 0655 10/19/22 0210 10/20/22 0145 10/21/22 0405  AST 30 770* 606* 480* 278*  ALT 10 134* 124* 120* 95*  ALKPHOS 44 22* 25* 31* 39  BILITOT 0.6 0.6 1.0 0.8 1.0  PROT 6.0* 4.0* 4.3* 4.6* 4.4*  ALBUMIN 3.3* 2.3* 2.5* 2.4* 1.9*    Recent Labs  Lab 10/16/22 2344  LIPASE 38    No results for input(s): "AMMONIA" in the last 168 hours.  CBC: Recent Labs  Lab 10/16/22 2344 10/18/22 0655 10/18/22 1135 10/18/22 2122 10/19/22 0210 10/19/22 0759 10/19/22 1426 10/20/22 0145 10/20/22 0755 10/20/22 1420 10/20/22 2019 10/21/22 0223 10/21/22 0405  WBC 11.2*   < > 10.6* 10.3 10.8* 11.6*  --  11.3*  --   --   --   --  10.8*  NEUTROABS 6.8  --  8.1* 7.4  --   --   --   --   --   --   --   --   --  HGB 11.5*   < > 5.4* 10.1* 9.3* 9.0*   < > 8.8* 8.9* 7.9* 8.9* 8.6* 8.6*  HCT 34.3*   < > 16.3* 28.4* 25.9* 25.2*   < > 25.1* 25.0* 22.0* 25.1* 24.0* 24.3*  MCV 96.6   < > 97.6 87.7 86.6 85.7  --  87.2  --   --   --   --  87.4  PLT 165   < > 92* 83* 83* 82*  --  81*  --   --   --   --  69*   < > = values in this interval not displayed.     Cardiac Enzymes: Recent Labs  Lab 10/17/22 1647 10/18/22 0655 10/19/22 0210 10/20/22 0145 10/21/22 0405  CKTOTAL >50,000* 25,443* 19,573* 12,021* 5,100*     BNP: Invalid input(s): "POCBNP"  CBG: Recent Labs  Lab 10/17/22 1737 10/18/22 0836  GLUCAP 121* 172*     Microbiology: Results for orders placed or performed during the hospital encounter of 10/16/22  Blood Culture (routine x 2)     Status: None (Preliminary result)   Collection Time: 10/16/22 11:44 PM   Specimen: BLOOD LEFT ARM  Result Value Ref Range Status   Specimen Description   Final    BLOOD LEFT ARM Performed at [facility], [address], [place], [place] [address]    Special Requests   Final    BOTTLES DRAWN AEROBIC AND ANAEROBIC  Blood Culture adequate volume Performed at [facility], [address], [place], [place] [address]    Culture  Setup Time   Final    GRAM POSTIVE ORGANISM AEROBIC BOTTLE ONLY CRITICAL VALUE NOTED.  VALUE IS CONSISTENT WITH PREVIOUSLY REPORTED AND CALLED VALUE.    Culture   Final    CULTURE REINCUBATED FOR BETTER GROWTH Performed at [facility], [address] [place], [place] [address]    Report Status PENDING  Incomplete  Blood Culture (routine x 2)     Status: Abnormal (Preliminary result)   Collection Time: 10/16/22 11:44 PM   Specimen: BLOOD RIGHT ARM  Result Value Ref Range Status   Specimen Description   Final    BLOOD RIGHT ARM Performed at [facility], [address] [place], [place] [address]    Special Requests   Final    BOTTLES DRAWN AEROBIC AND ANAEROBIC Blood Culture adequate volume Performed at [facility], [address], [place], [place] [address]    Culture  Setup Time   Final    GRAM POSITIVE COCCI ANAEROBIC BOTTLE ONLY CRITICAL RESULT CALLED TO, READ BACK BY AND VERIFIED WITH: NATHAN BELUE @ 2253 10/17/22 [facility] GRAM POSITIVE RODS AEROBIC BOTTLE ONLY CRITICAL RESULT CALLED TO, READ BACK BY AND VERIFIED WITH: JASON ROBBINS@0310  10/19/22 [facility]    Culture (A)  Final    STAPHYLOCOCCUS EPIDERMIDIS THE SIGNIFICANCE OF ISOLATING THIS ORGANISM FROM A SINGLE SET OF BLOOD CULTURES WHEN MULTIPLE SETS ARE DRAWN IS UNCERTAIN. PLEASE NOTIFY THE MICROBIOLOGY DEPARTMENT WITHIN ONE WEEK IF SPECIATION AND SENSITIVITIES ARE REQUIRED. CULTURE REINCUBATED FOR BETTER GROWTH Performed at [facility], [address], [place], [place] [address]    Report Status PENDING  Incomplete  SARS Coronavirus 2 by RT PCR (hospital order, performed in [facility] hospital lab) *cepheid single result test* Anterior Nasal Swab     Status: None   Collection Time: 10/16/22 11:44 PM   Specimen: Anterior Nasal Swab  Result Value Ref Range Status   SARS  Coronavirus 2 by RT PCR NEGATIVE NEGATIVE Final    Comment: (NOTE) SARS-CoV-2 target nucleic acids are NOT DETECTED.  The SARS-CoV-2 RNA is generally detectable in upper and lower respiratory specimens during the acute phase of infection. The lowest concentration of SARS-CoV-2 viral copies this assay can detect is 250 copies / mL. A negative result does not preclude SARS-CoV-2 infection and should not be used as the sole basis for treatment or other patient management decisions.  A negative result may occur with improper specimen collection / handling, submission of specimen other than nasopharyngeal swab, presence of viral mutation(s) within the areas targeted by this assay, and inadequate number of viral copies (<250 copies / mL). A negative result must be combined with clinical observations, patient history, and epidemiological information.  Fact Sheet for Patients:   RoadLapTop.co.za  Fact Sheet for Healthcare Providers: http://kim-miller.com/  This test is not yet approved or  cleared by the [place] FDA and has been authorized for detection and/or diagnosis of SARS-CoV-2 by FDA under an Emergency Use Authorization (EUA).  This EUA will remain in effect (meaning this test can be used) for the duration of the COVID-19 declaration under Section 564(b)(1) of the Act, 21 U.S.C. section 360bbb-3(b)(1), unless the authorization is terminated or revoked sooner.  Performed at [facility], [address] [place], [place] [address]   Blood Culture ID Panel (Reflexed)     Status: Abnormal   Collection Time: 10/16/22 11:44 PM  Result Value Ref Range Status   Enterococcus faecalis NOT DETECTED NOT DETECTED Final   Enterococcus Faecium NOT DETECTED NOT DETECTED Final   Listeria monocytogenes NOT DETECTED NOT DETECTED Final   Staphylococcus species DETECTED (A) NOT DETECTED Final    Comment: CRITICAL RESULT CALLED TO, READ  BACK BY AND VERIFIED WITH: NATHAN BELUE @ 2253 10/17/22 [facility]    Staphylococcus aureus (BCID) NOT DETECTED NOT DETECTED Final   Staphylococcus epidermidis DETECTED (A) NOT DETECTED Final    Comment: Methicillin (oxacillin) resistant coagulase negative staphylococcus. Possible blood culture contaminant (unless isolated from more than one blood culture draw or clinical case suggests pathogenicity). No antibiotic treatment is indicated for blood  culture contaminants. CRITICAL RESULT CALLED TO, READ BACK BY AND VERIFIED WITH: NATHAN BELUE @ 2253 10/17/22 [facility]    Staphylococcus lugdunensis NOT DETECTED NOT DETECTED Final   Streptococcus species NOT DETECTED NOT DETECTED Final   Streptococcus agalactiae NOT DETECTED NOT DETECTED Final   Streptococcus pneumoniae NOT DETECTED NOT DETECTED Final   Streptococcus pyogenes NOT DETECTED NOT DETECTED Final   A.calcoaceticus-baumannii NOT DETECTED NOT DETECTED Final   Bacteroides fragilis NOT DETECTED NOT DETECTED Final   Enterobacterales NOT DETECTED NOT DETECTED Final   Enterobacter cloacae complex NOT DETECTED NOT DETECTED Final   Escherichia coli NOT DETECTED NOT DETECTED Final   Klebsiella aerogenes NOT DETECTED NOT DETECTED Final   Klebsiella oxytoca NOT DETECTED NOT DETECTED Final   Klebsiella pneumoniae NOT DETECTED NOT DETECTED Final   Proteus species NOT DETECTED NOT DETECTED Final   Salmonella species NOT DETECTED NOT DETECTED Final   Serratia marcescens NOT DETECTED NOT DETECTED Final   Haemophilus influenzae NOT DETECTED NOT DETECTED Final   Neisseria meningitidis NOT DETECTED NOT DETECTED Final   Pseudomonas aeruginosa NOT DETECTED NOT DETECTED Final   Stenotrophomonas maltophilia NOT DETECTED NOT DETECTED Final   Candida albicans NOT DETECTED NOT DETECTED Final   Candida auris NOT DETECTED NOT DETECTED Final   Candida glabrata NOT DETECTED NOT DETECTED Final   Candida krusei  NOT DETECTED NOT DETECTED Final   Candida parapsilosis NOT  DETECTED NOT DETECTED Final   Candida tropicalis NOT DETECTED NOT DETECTED Final   Cryptococcus neoformans/gattii NOT DETECTED NOT DETECTED Final   Methicillin resistance mecA/C DETECTED (A) NOT DETECTED Final    Comment: CRITICAL RESULT CALLED TO, READ BACK BY AND VERIFIED WITH: NATHAN BELUE @ 11/22[birth year]51 10/17/22 [facility] Performed at [facility], [address] [place], [place] [address]   Respiratory (~20 pathogens) panel by PCR     Status: None   Collection Time: 10/17/22  9:26 AM   Specimen: Nasopharyngeal Swab; Respiratory  Result Value Ref Range Status   Adenovirus NOT DETECTED NOT DETECTED Final   Coronavirus 229E NOT DETECTED NOT DETECTED Final    Comment: (NOTE) The Coronavirus on the Respiratory Panel, DOES NOT test for the novel  Coronavirus Nov 21, 2017 nCoV)    Coronavirus HKU1 NOT DETECTED NOT DETECTED Final   Coronavirus NL63 NOT DETECTED NOT DETECTED Final   Coronavirus OC43 NOT DETECTED NOT DETECTED Final   Metapneumovirus NOT DETECTED NOT DETECTED Final   Rhinovirus / Enterovirus NOT DETECTED NOT DETECTED Final   Influenza A NOT DETECTED NOT DETECTED Final   Influenza B NOT DETECTED NOT DETECTED Final   Parainfluenza Virus 1 NOT DETECTED NOT DETECTED Final   Parainfluenza Virus 2 NOT DETECTED NOT DETECTED Final   Parainfluenza Virus 3 NOT DETECTED NOT DETECTED Final   Parainfluenza Virus 4 NOT DETECTED NOT DETECTED Final   Respiratory Syncytial Virus NOT DETECTED NOT DETECTED Final   Bordetella pertussis NOT DETECTED NOT DETECTED Final   Bordetella Parapertussis NOT DETECTED NOT DETECTED Final   Chlamydophila pneumoniae NOT DETECTED NOT DETECTED Final   Mycoplasma pneumoniae NOT DETECTED NOT DETECTED Final    Comment: Performed at [facility], [address] [place], [place] [address]  MRSA Next Gen by PCR, Nasal     Status: None   Collection Time: 10/18/22 10:53 AM   Specimen: Nasal Mucosa; Nasal Swab  Result Value Ref Range Status   MRSA by PCR Next Gen  NOT DETECTED NOT DETECTED Final    Comment: (NOTE) The GeneXpert MRSA Assay (FDA approved for NASAL specimens only), is one component of a comprehensive MRSA colonization surveillance program. It is not intended to diagnose MRSA infection nor to guide or monitor treatment for MRSA infections. Test performance is not FDA approved in patients less than 34 years old. Performed at [facility], [address] [place], [place] [address]     Coagulation Studies: Recent Labs    10/18/22 11-21-20 10/19/22 0210  LABPROT 16.3* 16.2*  INR 1.3* 1.3*     Urinalysis: No results for input(s): "COLORURINE", "LABSPEC", "PHURINE", "GLUCOSEU", "HGBUR", "BILIRUBINUR", "KETONESUR", "PROTEINUR", "UROBILINOGEN", "NITRITE", "LEUKOCYTESUR" in the last 72 hours.  Invalid input(s): "APPERANCEUR"    Imaging: No results found.   Medications:    azithromycin Stopped (10/20/22 1759)   linezolid (ZYVOX) IV Stopped (10/21/22 0011)   sodium bicarbonate 150 mEq in sterile water 1,150 mL infusion 75 mL/hr at 10/21/22 9147    benztropine mesylate  2 mg Intramuscular BID   Or   benztropine  2 mg Oral BID   busPIRone  15 mg Oral BID   Chlorhexidine Gluconate Cloth  6 each Topical Daily   cholecalciferol  2,000 Units Oral Daily   cloZAPine  100 mg Oral Daily   And   cloZAPine  200 mg Oral QHS   finasteride  5 mg Oral Daily   glycopyrrolate  1  mg Oral Daily   levothyroxine  75 mcg Oral Q0600   midodrine  10 mg Oral TID WC   nicotine  21 mg Transdermal Daily   mouth rinse  15 mL Mouth Rinse 4 times per day   pantoprazole  40 mg Oral Daily   senna-docusate  1 tablet Oral BID   sodium chloride  1 g Oral BID WC   traZODone  50 mg Oral QHS   valbenazine  80 mg Oral Daily   vitamin B-12  100 mcg Oral Daily   acetaminophen **OR** acetaminophen, albuterol, alum & mag hydroxide-simeth, bismuth subsalicylate, guaiFENesin, loperamide, LORazepam, magnesium hydroxide, magnesium hydroxide, meclizine,  methocarbamol, morphine injection, ondansetron **OR** ondansetron (ZOFRAN) IV, mouth rinse, oxyCODONE-acetaminophen, polyethylene glycol  Assessment/ Plan:  Mr. Leighton Rodney is a 67 y.o.  male with past medical history of hypothyroidism, depression with anxiety, schizoaffective, and COPD, who was admitted to [facility] on 10/16/2022 for Lactic acidosis [E87.20] Elevated troponin level [R79.89] Demand ischemia [I24.89] NSTEMI (non-ST elevated myocardial infarction) (HCC) [I21.4] Acute kidney injury (HCC) [N17.9] Septic shock (HCC) [A41.9, R65.21] Sepsis due to undetermined organism (HCC) [A41.9] Altered mental status, unspecified altered mental status type [R41.82] SIRS (systemic inflammatory response syndrome) (HCC) [R65.10]   Acute kidney injury with hyponatremia likely secondary to rhabdomyolysis.  Patient has also developed volume overload. CK greater than 29,000 on admission.Although creatinine continues to rise, CK is improving. Mentation baseline unknown.   - will change rate of ivf to maintenance at 35 cc/hr - palliative consult to clarify goals of care  - patient appears to have poor overall health - concern about ongoing aspiration - no significant response to iv lasix   Lab Results  Component Value Date   CREATININE 6.98 (H) 10/21/2022   CREATININE 5.65 (H) 10/20/2022   CREATININE 5.14 (H) 10/19/2022    Intake/Output Summary (Last 24 hours) at 10/21/2022 0810 Last data filed at 10/21/2022 0611 Gross per 24 hour  Intake 2501.27 ml  Output 410 ml  Net 2091.27 ml    2. Anemia of acute blood loss:  Lab Results  Component Value Date   HGB 8.6 (L) 10/21/2022  Has received blood transfusions during this admission.   3. Acute respiratory failure requiring HFNC, right lower lobe pneumonia noted with positive blood culture, bacteremia. Receiving Linezolid. ID following.      LOS: 4 Gizzelle Lacomb 7/4/20248:10 AM

## 2022-10-21 NOTE — Progress Notes (Signed)
Progress Note   Patient: Austin Marsh ZOX:[id number] DOB: [birth year] DOA: 10/16/2022     4 DOS: the patient was seen and examined on 10/21/2022    Subjective:  Patient seen and examined at bedside this morning Able to engage in meaningful conversation Still pleasantly confused Denies nausea vomiting abdominal pain Continues to be in mittens   "Kemoni Rudell is a 67 y.o. male with medical history significant of COPD, hypothyroidism, depression with anxiety, BPH, CKD-3A, schizoaffective disorder with bipolar, tardive dyskinesia, OSA on CPAP, who presents with fall and AMS."  Caregiver at family care facility.  reported pt was in his normal health condition until 10:30 PM yesterday when pt had fall and was found on the floor. At normal baseline, patient is alert oriented x 3 but today is confused.  On admission, pt was lethargic, oriented to person and place, not to year or situation.  He reported right hip pain with moving right leg, low back pain, some chest pain which he was unable to characterize further, constipation, suprapubic pain.  He was found to have acute urinary retention (973 cc on bladder scan).  Foley catheter was placed.     In the ED - fever of 101, HR 123, RR 26, stable O2 sat on room air.  Per EMS report, patient had low BP, but at time of admission BP was stable at 124/73.   Chest x-ray negative.   CT of head negative for acute intracranial abnormalities.   CT Chest/Abd/Pelvis: Moderate coronary artery calcification. No acute intrathoracic or intra-abdominal pathology identified. Stable 9 mm right adrenal adenoma. Stable changes of adrenal hyperplasia. Marked distention of the bladder possibly reflecting changes of bladder outlet obstruction. Mild prostatic hypertrophy.  Probable post TURP changes.  X-ray of bilateral hips/pelvis negative for fracture.   X-ray of L-spine and T-spine negative for acute fracture, did showed degenerative disc disease    Notable labs - WBC 11.2,  severe AKI with Cr 7.78, sodium 123, negative COVID PCR, lactic acid> 9.0 ---> 4.9, troponin level 172, 406, 804, 784.  Sodium 123, magnesium 2.3,  Hemoglobin 11.5.   Pt was admitted to PCU. Subsequently found to have severe rhabdomyolysis with CK peaking above 50k. IV heparin was started and cardiology consulted due to troponin elevation.   7/1 -- pt hypotensive, requiring IV fluid boluses.  Worsening encephalopathy, Hbg dropped to 6.3, large area of ecchymosis and swelling in the left neck & upper shoulder concerning for hematoma, later confirmed on CT check and neck soft tissue.  Pt was transferred to stepdown for developing shock. PCCM consulted.  Transfused 2 units pRBC's, 1 FFP.   7/2 -- BP's improved after 3 units pRBC's, 1 FFP total.  Hbg improved from nadir 5.4 to 10.1 >> 9.3 this AM.  +Blood cultures with GPC's and GPR's.  ID consulted.  CK trending down. Nephrology consulted with worsening renal function and poor urine output reported.   Assessment and Plan: * Septic shock (HCC)-improved On admission, tachycardic, tachypneic, febrile. Lactic acid was above 9 meeting septic shock criteria (with dehydration, AKI, rhabdo contributing).  Hypotension seemed more due to acute blood loss 2/2 left neck hematoma.  BP responded to fluids, blood products and midodrine, did not require pressors. Has bacteremia and RLL pneumonia suspect acute aspiration on 7/1 while encephalopathic  Continue current antibiotics --Follow cultures --ID consulted for bacteremia --Trend lactate, procal --Monitor fever curve, CBC, hemodynamics   Acute respiratory failure with hypoxia (HCC) Right lower lobe pneumonia, not POA, suspect acute aspiration event  on 7/1. --On Linezolid IV for gram+ bacteremia --On Zithromax IV --Continue antibiotics --ID is following due to positive blood cultures  --Wean O2 as tolerated, maintain spO2 >90%   Bacteremia due to Staphylococcus Initial blood cx +staph epidermidis felt  contaminant. Now GPC's in anaerobic bottle only, GPR's in aerobic bottle only. --On IV Linezolid for this, IV Zithromax to cover PNA. -Continue current antibiotics Infectious disease on board we appreciate input   Hematoma Hematoma Left Neck/Shoulder  Acute Blood Loss Anemia Hypotension due to above and ?septic shock 7/1 - Hbg dropped from 11.5 >> 6.3 and pt noted to have new swelling and ecchymosis at the left neck & upper shoulder.  --Heparin was stopped  --Transfused total 3 units pRBC's and 1 FFP --Transferred to stepdown 7/1 --PCCM consulted for pressors if needed - signed off, BP's stabilized Monitor CBC closely Rhabdomyolysis CK peaked > 50000 Given aggressive IV hydration for this and hypotension, then devoped hypoxia on 7/1, concern for pulmonary edema but found to have RLL collapse. Fluids were held. --Continue IV fluid as recommended by nephrologist --Continue to --Plan of care discussed with nephrologist this morning   Acute metabolic encephalopathy CT head on admission negative.  Multifactorial due to metabolic derangements, sepsis / bacteremia, underlying psych conditions --Resume home psychiatric meds once safe to take PO --Continue delirium precaution -As needed Ativan for severe agitation otherwise we will avoid benzodiazepine use   Acute kidney injury (HCC) Stage 3a CKD: Baseline Cr 1.17 on 06/04/2022.  Cr on admission 7.78 AKI likely due to severe rhabdomyolysis, sepsis, dehydration, urinary retention secondary to bladder outlet obstruction. --Consult nephrology, appreciate recs --Patient was on bicarb drip --Nephrologist on board, patient did not have significant improvement with IV Lasix -By nephrotoxic medications Renally dose all drugs   Acute retention of urine Patient has underlying BPH Foley placed on admission. Hold Cardura given hypotension. Proscar was started on admission Outpatient urology follow-up For voiding trial when patient is improved    NSTEMI (non-ST elevated myocardial infarction) (HCC) Elevated troponin - demand ischemia & delayed clearance with severe AKI. -Cardiology was consulted, signed off --IV heparin stopped 7/1 AM with acute anemia and hematoma --Continue to monitor closely   Fall Unknown cause, pt was found on the floor at his family care home, per caregiver's history on admission. --PT OT evaluation when clinically improved --Imaging was negative for acute injuries / fractures --Neck hematoma developed 7/1 Confirmed on CT chest and neck soft tissue obtained later on 7/1 PM   COPD (chronic obstructive pulmonary disease) (HCC) Stable -PRN bronchodilators and Mucinex I personally reviewed patient's CT scan of the chest that showed some bilateral pleural effusion as well as findings suggestive of right lower lobe atelectasis/collapse Pulmonary on board we appreciate input  Hyponatremia Sodium 124 on 10/21/2022 Saline fluids stopped due to hypoxia on 7/1, currently on bicarb drip. --Monitor BMP --Monitor volume status --Nephrologist on board and case discussed at this point the recommendation is for hospice however we are yet to get patient's family to make this decision Follow-up on check serum & urine osmolality, TSH, urine sodium, cortisol with AM labs   Hypothyroidism --Continue Synthroid     Schizoaffective disorder, bipolar type (HCC) Resume home PO meds when able Allergy listed to haldol, will use IV Ativan PRN if agitation   Depression with anxiety -Resume home Benztropine, BuSpar, clozapine, Depakote, Ingrezza when able to take PO meds   Tobacco abuse Nicotine patch ordered     Disposition: Status is: Inpatient Remains inpatient  appropriate because: severity of illness, pt is critically ill with multiple acute issues as above    Planned Discharge Destination: Home     Physical examination:  General exam: Awake but confused HEENT: Ecchymosis and swelling noted at the left neck  appears improved  Respiratory system: CTAB, diminished bases Cardiovascular system: normal S1/S2, RRR, trace pedal edema.   Gastrointestinal system: soft, NT, ND Central nervous system: very limited exam due to encephalopathy and incoherent speech, follows commands Extremities: Bilateral upper extremity mittens Skin: dry, intact, normal temperature, pale appearing Psychiatry: exam limited by encephalopathy     Data Reviewed: I have reviewed patient's lab data as well as infectious disease documentation provided documentation  Family Communication: None present at bedside   Vitals:   10/21/22 0900 10/21/22 1000 10/21/22 1100 10/21/22 1121  BP: (!) 152/74 (!) 146/77 (!) 162/68   Pulse: 84 80 85   Resp: (!) 27 (!) 21 (!) 27   Temp:      TempSrc:      SpO2: 100% 100% 100% 98%  Weight:      Height:         Author: Loyce Dys, MD 10/21/2022 2:25 PM  For on call review [email or web address].

## 2022-10-21 NOTE — Progress Notes (Signed)
Speech Language Pathology Treatment: Dysphagia  Patient Details Name: Austin Marsh MRN: [id number] DOB: 1955/04/23 Today's Date: 10/21/2022 Time: 7829-5621 SLP Time Calculation (min) (ACUTE ONLY): 45 min  Assessment / Plan / Recommendation Clinical Impression  Pt seen today for ongoing assessment of swallowing today in hopes of diet upgrade. Pt awake, mumbling w/ muttered speech and poor intelligibility AND often singing. Pt was oriented to his name but nothing else. He did not follow any commands. Pt has been agitated requiring sedating medications per NSG.  On O2 [place] 5, afebrile. WBC elevated at 10.8.   Pt appears to present w/ oropharyngeal phase dysphagia in setting of declined (poor) Cognitive awareness and overall presentation; severe illness. ANY Cognitive decline can impact overall awareness/timing of swallow and safety during po tasks which increases risk for aspiration, choking thus Pulmonary decline.      A full oral diet is not safe at this time d/t HIGH risk for aspiration.    Pt was presented w/ oral care then trials of ice chips which he did not attend to and let fall anteriorly from his mouth. W/ tsps trials of Nectar liquids and puree, he appeared to give more Cognitive attention/focus to the boluses, demonstrate improved oral awareness for bolus management, and engage lingual attention to A-P transfer for swallowing. Pt exhibited min lick/sweep to lingually clear bolus residue on lips. However, during these po trials, pt was often singing/talking w/ po material in mouth despite instruction not to do so. He became agitated x1 w/ this SLP -- gave 1- mins to calm again.  No overt pharyngeal phase dysphagia noted w/ the Nectar liquids and puree given; no overt coughing nor decline in vocal quality or respiratory presentation during/post the trials.   OM Exam revealed no gross unilateral weakness; confusion of OM tasks and during oral care noted. Pt often turned his head away.          In setting of current presentation of declined Cognitive status and inability to follow commands, along w/ POOR focus and attention during po tasks, recommend continue NPO status w/ frequent oral care to engage oral stim/swallow and to provide hygiene. Recommend therapeutic/pleasure trials of Applesauce w/ NSG as they give po meds CRUSHED in Puree -- strict aspiration precautions and Supervision. ST services will f/u w/ pt's status tomorrow for ongoing assessement for appropriateness for an oral diet. MD/NSG updated, agreed. Precautions posted in room for Applesauce/Crushed pills including reducing distractions.      HPI HPI: Pt is a 67 y.o. male with medical history significant of COPD, hypothyroidism, depression with anxiety, BPH, CKD-3A, schizoaffective disorder with bipolar, tardive dyskinesia, OSA on CPAP, who presents with fall and AMS.  Pt resides at a [facility].  Per care giver, at normal baseline, patient is alert oriented x 3.  Upon admit to the ED, the pt was lethargic and confused. Pt admited w/ SIRS and rhabdomyolysis w/ hyponatremia.  He required BIPAP in ICU -- NSG reported concern he may have aspirated secretions/phelgm d/t his thick secretions/congestion.    CT Head: no acute abnormaility.  CT Chest: Debris in the right lower lobe bronchus (series  3/image 34), new from recent prior, with new right lower lobe  atelectasis/collapse. This appearance favors mucous plugging.      SLP Plan  Continue with current plan of care      Recommendations for follow up therapy are one component of a multi-disciplinary discharge planning process, led by the attending physician.  Recommendations may be updated  based on patient status, additional functional criteria and insurance authorization.    Recommendations  Diet recommendations: NPO (except Applesauce w/ NSG) Medication Administration: Crushed with puree Supervision: Staff to assist with self feeding;Full supervision/cueing for  compensatory strategies Compensations: Minimize environmental distractions;Slow rate;Small sips/bites (applesauce) Postural Changes and/or Swallow Maneuvers: Seated upright 90 degrees;Upright 30-60 min after meal                 (Dietician f/u; Palliative Care f/u) Oral care BID;Oral care before and after PO;Staff/trained caregiver to provide oral care   Frequent or constant Supervision/Assistance Dysphagia, oropharyngeal phase (R13.12) (POOR Cognitive awarness/decline)     Continue with current plan of care       Austin Som, MS, CCC-SLP Speech Language Pathologist Rehab Services; [facility] [phone] (ascom) Austin Marsh  10/21/2022, 2:16 PM

## 2022-10-22 DIAGNOSIS — M6282 Rhabdomyolysis: Secondary | ICD-10-CM | POA: Diagnosis not present

## 2022-10-22 DIAGNOSIS — W1830XD Fall on same level, unspecified, subsequent encounter: Secondary | ICD-10-CM | POA: Diagnosis not present

## 2022-10-22 DIAGNOSIS — I2489 Other forms of acute ischemic heart disease: Secondary | ICD-10-CM

## 2022-10-22 DIAGNOSIS — E872 Acidosis, unspecified: Secondary | ICD-10-CM

## 2022-10-22 DIAGNOSIS — A419 Sepsis, unspecified organism: Secondary | ICD-10-CM | POA: Diagnosis not present

## 2022-10-22 DIAGNOSIS — R6521 Severe sepsis with septic shock: Secondary | ICD-10-CM | POA: Diagnosis not present

## 2022-10-22 DIAGNOSIS — B957 Other staphylococcus as the cause of diseases classified elsewhere: Secondary | ICD-10-CM | POA: Diagnosis not present

## 2022-10-22 DIAGNOSIS — R7989 Other specified abnormal findings of blood chemistry: Secondary | ICD-10-CM

## 2022-10-22 DIAGNOSIS — N179 Acute kidney failure, unspecified: Secondary | ICD-10-CM | POA: Diagnosis not present

## 2022-10-22 LAB — BASIC METABOLIC PANEL
Anion gap: 13 (ref 5–15)
BUN: 73 mg/dL — ABNORMAL HIGH (ref 8–23)
CO2: 28 mmol/L (ref 22–32)
Calcium: 7.3 mg/dL — ABNORMAL LOW (ref 8.9–10.3)
Chloride: 86 mmol/L — ABNORMAL LOW (ref 98–111)
Creatinine, Ser: 7.66 mg/dL — ABNORMAL HIGH (ref 0.61–1.24)
GFR, Estimated: 7 mL/min — ABNORMAL LOW (ref >60)
Glucose, Bld: 103 mg/dL — ABNORMAL HIGH (ref 70–99)
Potassium: 5 mmol/L (ref 3.5–5.1)
Sodium: 127 mmol/L — ABNORMAL LOW (ref 135–145)

## 2022-10-22 LAB — CBC WITH DIFFERENTIAL/PLATELET
Abs Immature Granulocytes: 0.1 10*3/uL — ABNORMAL HIGH (ref 0.00–0.07)
Basophils Absolute: 0 10*3/uL (ref 0.0–0.1)
Basophils Relative: 0 %
Eosinophils Absolute: 0 10*3/uL (ref 0.0–0.5)
Eosinophils Relative: 0 %
HCT: 25.7 % — ABNORMAL LOW (ref 39.0–52.0)
Hemoglobin: 9 g/dL — ABNORMAL LOW (ref 13.0–17.0)
Immature Granulocytes: 1 %
Lymphocytes Relative: 8 %
Lymphs Abs: 0.8 10*3/uL (ref 0.7–4.0)
MCH: 30.7 pg (ref 26.0–34.0)
MCHC: 35 g/dL (ref 30.0–36.0)
MCV: 87.7 fL (ref 80.0–100.0)
Monocytes Absolute: 1 10*3/uL (ref 0.1–1.0)
Monocytes Relative: 10 %
Neutro Abs: 8.3 10*3/uL — ABNORMAL HIGH (ref 1.7–7.7)
Neutrophils Relative %: 81 %
Platelets: 71 10*3/uL — ABNORMAL LOW (ref 150–400)
RBC: 2.93 MIL/uL — ABNORMAL LOW (ref 4.22–5.81)
RDW: 14.6 % (ref 11.5–15.5)
WBC: 10.2 10*3/uL (ref 4.0–10.5)
nRBC: 0.2 % (ref 0.0–0.2)

## 2022-10-22 LAB — CK: Total CK: 2838 U/L — ABNORMAL HIGH (ref 49–397)

## 2022-10-22 MED ORDER — SALINE SPRAY 0.65 % NA SOLN
1.0000 | NASAL | Status: DC | PRN
  Filled 2022-10-22: qty 44

## 2022-10-22 NOTE — TOC Progression Note (Addendum)
Transition of Care [facility]) - Progression Note    Patient Details  Name: Austin Marsh MRN: [id number] Date of Birth: 10-21-1955  Transition of Care [facility]) CM/SW Contact  Margarito Liner, LCSW Phone Number: 10/22/2022, 12:29 PM  Clinical Narrative: Per palliative NP, patient's wife is unable to make decisions in regards to goals of care. Lamar Laundry is not patient's daughter. CSW tried calling APS x 2 to make report but was on hold for extended periods of time and unable to leave a message. Will try again later.  1:26 pm: Received call back from APS. CSW provided report information.  Expected Discharge Plan and Services                                               Social Determinants of Health (SDOH) Interventions SDOH Screenings   Food Insecurity: No Food Insecurity (10/17/2022)  Housing: Patient Unable To Answer (10/17/2022)  Tobacco Use: High Risk (10/16/2022)    Readmission Risk Interventions    03/17/2020    1:48 PM  Readmission Risk Prevention Plan  Transportation Screening Complete  Palliative Care Screening Not Applicable  Medication Review (RN Care Manager) Complete

## 2022-10-22 NOTE — Plan of Care (Signed)
  Problem: Safety: Goal: Ability to remain free from injury will improve Outcome: Progressing   Problem: Education: Goal: Knowledge of General Education information will improve Description: Including pain rating scale, medication(s)/side effects and non-pharmacologic comfort measures Outcome: Not Progressing   Problem: Activity: Goal: Risk for activity intolerance will decrease Outcome: Not Progressing   Problem: Nutrition: Goal: Adequate nutrition will be maintained Outcome: Not Progressing

## 2022-10-22 NOTE — Progress Notes (Signed)
   10/22/22 1200  Spiritual Encounters  Type of Visit Initial  Care provided to: Pt and family  Conversation partners present during encounter Physician;Nurse  Referral source Nurse (RN/NT/LPN);Family  Reason for visit Urgent spiritual support  OnCall Visit Yes  Spiritual Framework  Presenting Themes Meaning/purpose/sources of inspiration;Significant life change;Rituals and practive  Family Stress Factors Major life changes  Interventions  Spiritual Care Interventions Made Established relationship of care and support;Compassionate presence;Prayer  Spiritual Care Plan  Spiritual Care Issues Still Outstanding No further spiritual care needs at this time (see row info)   Receive page from ICU to pray with patient's wife. Provided prayer for patient wife and patient. Wife seem to be exteremly sad dealing with husband's medical condition. Advise Spouse we are here 24/7 if she needed someone to talk to during this time.

## 2022-10-22 NOTE — Progress Notes (Signed)
[facility]  ROUNDING NOTE   Subjective:   Patient remains in ICU Somnolent Weaned to 4L [place] Generalized edema noted  Creatinine 7.66  Objective:  Vital signs in last 24 hours:  Temp:  [97.4 F (36.3 C)-97.7 F (36.5 C)] 97.4 F (36.3 C) (07/05 0800) Pulse Rate:  [76-90] 78 (07/05 1100) Resp:  [8-29] 8 (07/05 1100) BP: (104-165)/(11-80) 129/69 (07/05 1100) SpO2:  [94 %-100 %] 100 % (07/05 1100) FiO2 (%):  [35 %] 35 % (07/04 1800) Weight:  [68.3 kg] 68.3 kg (07/05 0500)  Weight change: -22.8 kg Filed Weights   10/20/22 0702 10/21/22 0500 10/22/22 0500  Weight: 91.1 kg 71.1 kg 68.3 kg    Intake/Output: I/O last 3 completed shifts: In: 1879.2 [I.V.:1579.2; IV Piggyback:300] Out: 720 [Urine:720]   Intake/Output this shift:  Total I/O In: -  Out: 200 [Urine:200]  Physical Exam: General: Ill appearing  Head: Normocephalic, atraumatic. Dry oral mucosal membranes  Eyes: Anicteric  Lungs:  Wheeze present, [place]  Heart: Regular rate and rhythm  Abdomen:  Soft, nontender  Extremities:  2+ generalized edema.  Neurologic: Lethargic, minimally responsive to voice  Skin: Weeping edema  Access: None  Foley in place  Basic Metabolic Panel: Recent Labs  Lab 10/17/22 0500 10/17/22 0725 10/18/22 1135 10/18/22 2122 10/19/22 0210 10/19/22 1426 10/20/22 0145 10/21/22 0405 10/22/22 0409  NA  --    < >  --    < > 127* 127* 127* 124* 127*  K  --    < >  --    < > 4.4 4.4 4.3 4.5 5.0  CL  --    < >  --    < > 98 96* 94* 87* 86*  CO2  --    < >  --    < > 17* 20* 23 24 28   GLUCOSE  --    < >  --    < > 117* 102* 95 92 103*  BUN  --    < >  --    < > 33* 39* 43* 56* 73*  CREATININE  --    < >  --    < > 4.22* 5.14* 5.65* 6.98* 7.66*  CALCIUM  --    < >  --    < > 7.0* 7.1* 7.1* 6.9* 7.3*  MG 2.3  --  1.9  --  1.8  --   --   --   --   PHOS 3.5  --  5.4*  --  5.8*  --   --   --   --    < > = values in this interval not displayed.     Liver Function  Tests: Recent Labs  Lab 10/16/22 2344 10/18/22 0655 10/19/22 0210 10/20/22 0145 10/21/22 0405  AST 30 770* 606* 480* 278*  ALT 10 134* 124* 120* 95*  ALKPHOS 44 22* 25* 31* 39  BILITOT 0.6 0.6 1.0 0.8 1.0  PROT 6.0* 4.0* 4.3* 4.6* 4.4*  ALBUMIN 3.3* 2.3* 2.5* 2.4* 1.9*    Recent Labs  Lab 10/16/22 2344  LIPASE 38    No results for input(s): "AMMONIA" in the last 168 hours.  CBC: Recent Labs  Lab 10/16/22 2344 10/18/22 1610 10/18/22 1135 10/18/22 2122 10/19/22 0210 10/19/22 0759 10/19/22 1426 10/20/22 0145 10/20/22 0755 10/21/22 0223 10/21/22 0405 10/21/22 0800 10/21/22 1351 10/22/22 0409  WBC 11.2*   < > 10.6* 10.3 10.8* 11.6*  --  11.3*  --   --  10.8*  --   --  10.2  NEUTROABS 6.8  --  8.1* 7.4  --   --   --   --   --   --   --   --   --  8.3*  HGB 11.5*   < > 5.4* 10.1* 9.3* 9.0*   < > 8.8*   < > 8.6* 8.6* 8.6* 8.7* 9.0*  HCT 34.3*   < > 16.3* 28.4* 25.9* 25.2*   < > 25.1*   < > 24.0* 24.3* 24.1* 24.6* 25.7*  MCV 96.6   < > 97.6 87.7 86.6 85.7  --  87.2  --   --  87.4  --   --  87.7  PLT 165   < > 92* 83* 83* 82*  --  81*  --   --  69*  --   --  71*   < > = values in this interval not displayed.     Cardiac Enzymes: Recent Labs  Lab 10/18/22 0655 10/19/22 0210 10/20/22 0145 10/21/22 0405 10/22/22 0409  CKTOTAL 25,443* 19,573* 12,021* 5,100* 2,838*     BNP: Invalid input(s): "POCBNP"  CBG: Recent Labs  Lab 10/17/22 1737 10/18/22 0836  GLUCAP 121* 172*     Microbiology: Results for orders placed or performed during the hospital encounter of 10/16/22  Blood Culture (routine x 2)     Status: None (Preliminary result)   Collection Time: 10/16/22 11:44 PM   Specimen: BLOOD LEFT ARM  Result Value Ref Range Status   Specimen Description   Final    BLOOD LEFT ARM Performed at [facility], [address], [place], [place] [address]    Special Requests   Final    BOTTLES DRAWN AEROBIC AND ANAEROBIC Blood Culture adequate  volume Performed at [facility], [address] [place], [place] [address]    Culture  Setup Time   Final    GRAM POSTIVE ORGANISM AEROBIC BOTTLE ONLY CRITICAL VALUE NOTED.  VALUE IS CONSISTENT WITH PREVIOUSLY REPORTED AND CALLED VALUE.    Culture   Final    CULTURE REINCUBATED FOR BETTER GROWTH Performed at [facility], [address], [place], [place] [address]    Report Status PENDING  Incomplete  Blood Culture (routine x 2)     Status: Abnormal (Preliminary result)   Collection Time: 10/16/22 11:44 PM   Specimen: BLOOD RIGHT ARM  Result Value Ref Range Status   Specimen Description   Final    BLOOD RIGHT ARM Performed at [facility], [address] [place], [place] [address]    Special Requests   Final    BOTTLES DRAWN AEROBIC AND ANAEROBIC Blood Culture adequate volume Performed at [facility], [address] [place], [place] [address]    Culture  Setup Time   Final    GRAM POSITIVE COCCI ANAEROBIC BOTTLE ONLY CRITICAL RESULT CALLED TO, READ BACK BY AND VERIFIED WITH: NATHAN BELUE @ 2253 10/17/22 [facility] GRAM POSITIVE RODS AEROBIC BOTTLE ONLY CRITICAL RESULT CALLED TO, READ BACK BY AND VERIFIED WITH: JASON ROBBINS@0310  10/19/22 [facility]    Culture (A)  Final    STAPHYLOCOCCUS EPIDERMIDIS THE SIGNIFICANCE OF ISOLATING THIS ORGANISM FROM A SINGLE SET OF BLOOD CULTURES WHEN MULTIPLE SETS ARE DRAWN IS UNCERTAIN. PLEASE NOTIFY THE MICROBIOLOGY DEPARTMENT WITHIN ONE WEEK IF SPECIATION AND SENSITIVITIES ARE REQUIRED. Performed at [facility], [address] [place], [place] [address]    Report Status PENDING  Incomplete  SARS Coronavirus 2 by RT PCR (hospital order, performed in [facility] hospital lab) *cepheid single result test* Anterior Nasal Swab     Status: None   Collection Time: 10/16/22 11:44 PM   Specimen: Anterior Nasal Swab  Result Value Ref Range Status   SARS Coronavirus 2 by RT PCR NEGATIVE NEGATIVE Final    Comment:  (NOTE) SARS-CoV-2 target nucleic acids are NOT DETECTED.  The SARS-CoV-2 RNA is generally detectable in upper and lower respiratory specimens during the acute phase of infection. The lowest concentration of SARS-CoV-2 viral copies this assay can detect is 250 copies / mL. A negative result does not preclude SARS-CoV-2 infection and should not be used as the sole basis for treatment or other patient management decisions.  A negative result may occur with improper specimen collection / handling, submission of specimen other than nasopharyngeal swab, presence of viral mutation(s) within the areas targeted by this assay, and inadequate number of viral copies (<250 copies / mL). A negative result must be combined with clinical observations, patient history, and epidemiological information.  Fact Sheet for Patients:   RoadLapTop.co.za  Fact Sheet for Healthcare Providers: http://kim-miller.com/  This test is not yet approved or  cleared by the [place] FDA and has been authorized for detection and/or diagnosis of SARS-CoV-2 by FDA under an Emergency Use Authorization (EUA).  This EUA will remain in effect (meaning this test can be used) for the duration of the COVID-19 declaration under Section 564(b)(1) of the Act, 21 U.S.C. section 360bbb-3(b)(1), unless the authorization is terminated or revoked sooner.  Performed at [facility], [address] [place], [place] [address]   Blood Culture ID Panel (Reflexed)     Status: Abnormal   Collection Time: 10/16/22 11:44 PM  Result Value Ref Range Status   Enterococcus faecalis NOT DETECTED NOT DETECTED Final   Enterococcus Faecium NOT DETECTED NOT DETECTED Final   Listeria monocytogenes NOT DETECTED NOT DETECTED Final   Staphylococcus species DETECTED (A) NOT DETECTED Final    Comment: CRITICAL RESULT CALLED TO, READ BACK BY AND VERIFIED WITH: NATHAN BELUE @ 2253 10/17/22 [facility]     Staphylococcus aureus (BCID) NOT DETECTED NOT DETECTED Final   Staphylococcus epidermidis DETECTED (A) NOT DETECTED Final    Comment: Methicillin (oxacillin) resistant coagulase negative staphylococcus. Possible blood culture contaminant (unless isolated from more than one blood culture draw or clinical case suggests pathogenicity). No antibiotic treatment is indicated for blood  culture contaminants. CRITICAL RESULT CALLED TO, READ BACK BY AND VERIFIED WITH: NATHAN BELUE @ 2253 10/17/22 [facility]    Staphylococcus lugdunensis NOT DETECTED NOT DETECTED Final   Streptococcus species NOT DETECTED NOT DETECTED Final   Streptococcus agalactiae NOT DETECTED NOT DETECTED Final   Streptococcus pneumoniae NOT DETECTED NOT DETECTED Final   Streptococcus pyogenes NOT DETECTED NOT DETECTED Final   A.calcoaceticus-baumannii NOT DETECTED NOT DETECTED Final   Bacteroides fragilis NOT DETECTED NOT DETECTED Final   Enterobacterales NOT DETECTED NOT DETECTED Final   Enterobacter cloacae complex NOT DETECTED NOT DETECTED Final   Escherichia coli NOT DETECTED NOT DETECTED Final   Klebsiella aerogenes NOT DETECTED NOT DETECTED Final   Klebsiella oxytoca NOT DETECTED NOT DETECTED Final   Klebsiella pneumoniae NOT DETECTED NOT DETECTED Final   Proteus species NOT DETECTED NOT DETECTED Final   Salmonella species NOT DETECTED NOT DETECTED Final   Serratia marcescens NOT DETECTED NOT DETECTED Final   Haemophilus influenzae NOT DETECTED NOT DETECTED Final   Neisseria meningitidis NOT DETECTED NOT DETECTED  Final   Pseudomonas aeruginosa NOT DETECTED NOT DETECTED Final   Stenotrophomonas maltophilia NOT DETECTED NOT DETECTED Final   Candida albicans NOT DETECTED NOT DETECTED Final   Candida auris NOT DETECTED NOT DETECTED Final   Candida glabrata NOT DETECTED NOT DETECTED Final   Candida krusei NOT DETECTED NOT DETECTED Final   Candida parapsilosis NOT DETECTED NOT DETECTED Final   Candida tropicalis NOT DETECTED NOT  DETECTED Final   Cryptococcus neoformans/gattii NOT DETECTED NOT DETECTED Final   Methicillin resistance mecA/C DETECTED (A) NOT DETECTED Final    Comment: CRITICAL RESULT CALLED TO, READ BACK BY AND VERIFIED WITH: NATHAN BELUE @ 2253 10/17/22 [facility] Performed at [facility], [address] [place], [place] [address]   Respiratory (~20 pathogens) panel by PCR     Status: None   Collection Time: 10/17/22  9:26 AM   Specimen: Nasopharyngeal Swab; Respiratory  Result Value Ref Range Status   Adenovirus NOT DETECTED NOT DETECTED Final   Coronavirus 229E NOT DETECTED NOT DETECTED Final    Comment: (NOTE) The Coronavirus on the Respiratory Panel, DOES NOT test for the novel  Coronavirus (2019 nCoV)    Coronavirus HKU1 NOT DETECTED NOT DETECTED Final   Coronavirus NL63 NOT DETECTED NOT DETECTED Final   Coronavirus OC43 NOT DETECTED NOT DETECTED Final   Metapneumovirus NOT DETECTED NOT DETECTED Final   Rhinovirus / Enterovirus NOT DETECTED NOT DETECTED Final   Influenza A NOT DETECTED NOT DETECTED Final   Influenza B NOT DETECTED NOT DETECTED Final   Parainfluenza Virus 1 NOT DETECTED NOT DETECTED Final   Parainfluenza Virus 2 NOT DETECTED NOT DETECTED Final   Parainfluenza Virus 3 NOT DETECTED NOT DETECTED Final   Parainfluenza Virus 4 NOT DETECTED NOT DETECTED Final   Respiratory Syncytial Virus NOT DETECTED NOT DETECTED Final   Bordetella pertussis NOT DETECTED NOT DETECTED Final   Bordetella Parapertussis NOT DETECTED NOT DETECTED Final   Chlamydophila pneumoniae NOT DETECTED NOT DETECTED Final   Mycoplasma pneumoniae NOT DETECTED NOT DETECTED Final    Comment: Performed at [facility], [address] [place], [place] [address]  MRSA Next Gen by PCR, Nasal     Status: None   Collection Time: 10/18/22 10:53 AM   Specimen: Nasal Mucosa; Nasal Swab  Result Value Ref Range Status   MRSA by PCR Next Gen NOT DETECTED NOT DETECTED Final    Comment: (NOTE) The GeneXpert  MRSA Assay (FDA approved for NASAL specimens only), is one component of a comprehensive MRSA colonization surveillance program. It is not intended to diagnose MRSA infection nor to guide or monitor treatment for MRSA infections. Test performance is not FDA approved in patients less than 98 years old. Performed at [facility], [address] [place], [place] [address]     Coagulation Studies: No results for input(s): "LABPROT", "INR" in the last 72 hours.   Urinalysis: No results for input(s): "COLORURINE", "LABSPEC", "PHURINE", "GLUCOSEU", "HGBUR", "BILIRUBINUR", "KETONESUR", "PROTEINUR", "UROBILINOGEN", "NITRITE", "LEUKOCYTESUR" in the last 72 hours.  Invalid input(s): "APPERANCEUR"    Imaging: No results found.   Medications:    sodium bicarbonate 150 mEq in sterile water 1,150 mL infusion 35 mL/hr at 10/22/22 0244    benztropine mesylate  2 mg Intramuscular BID   Or   benztropine  2 mg Oral BID   busPIRone  15 mg Oral BID   Chlorhexidine Gluconate Cloth  6 each Topical Daily   cholecalciferol  2,000 Units Oral Daily   cloZAPine  100 mg Oral Daily   And   cloZAPine  200 mg Oral QHS   finasteride  5 mg Oral Daily   glycopyrrolate  1 mg Oral Daily   levothyroxine  75 mcg Oral Q0600   midodrine  10 mg Oral TID WC   nicotine  21 mg Transdermal Daily   mouth rinse  15 mL Mouth Rinse 4 times per day   pantoprazole  40 mg Oral Daily   senna-docusate  1 tablet Oral BID   traZODone  50 mg Oral QHS   valbenazine  80 mg Oral Daily   vitamin B-12  100 mcg Oral Daily   acetaminophen **OR** acetaminophen, albuterol, alum & mag hydroxide-simeth, bismuth subsalicylate, guaiFENesin, loperamide, LORazepam, magnesium hydroxide, magnesium hydroxide, meclizine, methocarbamol, morphine injection, ondansetron **OR** ondansetron (ZOFRAN) IV, mouth rinse, oxyCODONE-acetaminophen, polyethylene glycol  Assessment/ Plan:  Mr. Austin Marsh is a 67 y.o.  male with past medical  history of hypothyroidism, depression with anxiety, schizoaffective, and COPD, who was admitted to [facility] on 10/16/2022 for Lactic acidosis [E87.20] Elevated troponin level [R79.89] Demand ischemia [I24.89] NSTEMI (non-ST elevated myocardial infarction) (HCC) [I21.4] Acute kidney injury (HCC) [N17.9] Septic shock (HCC) [A41.9, R65.21] Sepsis due to undetermined organism (HCC) [A41.9] Altered mental status, unspecified altered mental status type [R41.82] SIRS (systemic inflammatory response syndrome) (HCC) [R65.10]   Acute kidney injury with hyponatremia likely secondary to rhabdomyolysis.  Patient has also developed volume overload. CK greater than 29,000 on admission.Although creatinine continues to rise, CK is improving. Mentation baseline unknown.   - Creatinine continues to worsen  - Awaiting palliative consult to clarify goals of care, desire for dialysis, etc - patient appears to have poor overall health - IV lasix given for 2 days, sub-optimal results   Lab Results  Component Value Date   CREATININE 7.66 (H) 10/22/2022   CREATININE 6.98 (H) 10/21/2022   CREATININE 5.65 (H) 10/20/2022    Intake/Output Summary (Last 24 hours) at 10/22/2022 1140 Last data filed at 10/22/2022 1100 Gross per 24 hour  Intake 671.13 ml  Output 525 ml  Net 146.13 ml    2. Anemia of acute blood loss:  Lab Results  Component Value Date   HGB 9.0 (L) 10/22/2022  Has received blood transfusions during this admission.   3. Acute respiratory failure requiring HFNC, right lower lobe pneumonia noted with positive blood culture, bacteremia. Receiving Linezolid. ID following. Now weaned to 4L       LOS: 5   7/5/202411:40 AM

## 2022-10-22 NOTE — Progress Notes (Signed)
Started calling out and yelling, Went to bedside, asked patient what was wrong, states "The antichrist is beating me!" I asked if he was in pain, he stated that his back and legs hurt. I medicated patient for pain and he calmed down immediately.

## 2022-10-22 NOTE — Progress Notes (Signed)
Progress Note   Patient: Austin Marsh ZHY:[id number] DOB: 1955/06/30 DOA: 10/16/2022     5 DOS: the patient was seen and examined on 10/22/2022     Subjective:  Patient seen and examined at bedside this morning Was less communicative today Denies chest pain nausea vomiting According to nursing staff no acute overnight issues   Brief hospital course: "Ad Glave is a 67 y.o. male with medical history significant of COPD, hypothyroidism, depression with anxiety, BPH, CKD-3A, schizoaffective disorder with bipolar, tardive dyskinesia, OSA on CPAP, who presents with fall and AMS."  Caregiver at family care facility.  reported pt was in his normal health condition until 10:30 PM yesterday when pt had fall and was found on the floor. At normal baseline, patient is alert oriented x 3 but today is confused.  On admission, pt was lethargic, oriented to person and place, not to year or situation.  He reported right hip pain with moving right leg, low back pain, some chest pain which he was unable to characterize further, constipation, suprapubic pain.  He was found to have acute urinary retention (973 cc on bladder scan).  Foley catheter was placed.     In the ED - fever of 101, HR 123, RR 26, stable O2 sat on room air.  Per EMS report, patient had low BP, but at time of admission BP was stable at 124/73.   Chest x-ray negative.   CT of head negative for acute intracranial abnormalities.   CT Chest/Abd/Pelvis: Moderate coronary artery calcification. No acute intrathoracic or intra-abdominal pathology identified. Stable 9 mm right adrenal adenoma. Stable changes of adrenal hyperplasia. Marked distention of the bladder possibly reflecting changes of bladder outlet obstruction. Mild prostatic hypertrophy.  Probable post TURP changes.  X-ray of bilateral hips/pelvis negative for fracture.   X-ray of L-spine and T-spine negative for acute fracture, did showed degenerative disc disease    Notable labs - WBC  11.2, severe AKI with Cr 7.78, sodium 123, negative COVID PCR, lactic acid> 9.0 ---> 4.9, troponin level 172, 406, 804, 784.  Sodium 123, magnesium 2.3,  Hemoglobin 11.5.   Pt was admitted to PCU. Subsequently found to have severe rhabdomyolysis with CK peaking above 50k. IV heparin was started and cardiology consulted due to troponin elevation.   7/1 -- pt hypotensive, requiring IV fluid boluses.  Worsening encephalopathy, Hbg dropped to 6.3, large area of ecchymosis and swelling in the left neck & upper shoulder concerning for hematoma, later confirmed on CT check and neck soft tissue.  Pt was transferred to stepdown for developing shock. PCCM consulted.  Transfused 2 units pRBC's, 1 FFP.   7/2 -- BP's improved after 3 units pRBC's, 1 FFP total.  Hbg improved from nadir 5.4 to 10.1 >> 9.3 this AM.  +Blood cultures with GPC's and GPR's.  ID consulted.  CK trending down. Nephrology consulted with worsening renal function and poor urine output reported.   Assessment and Plan: * Septic shock (HCC)-improved On admission, tachycardic, tachypneic, febrile. Lactic acid was above 9 meeting septic shock criteria (with dehydration, AKI, rhabdo contributing).  Hypotension seemed more due to acute blood loss 2/2 left neck hematoma.  BP responded to fluids, blood products and midodrine, did not require pressors. Has bacteremia and RLL pneumonia suspect acute aspiration on 7/1 while encephalopathic  Patient completed a course of Zyvox and currently antibiotics have been discontinued by infectious disease specialist we appreciate input Monitor white count closely   Acute respiratory failure with hypoxia (HCC) Right lower  lobe pneumonia, not POA, suspect acute aspiration event on 7/1. Antibiotics completed and discontinued by ID on 10/22/2022 --Wean O2 as tolerated, maintain spO2 >90%   Bacteremia due to Staphylococcus Initial blood cx +staph epidermidis felt contaminant.  Antibiotics completed and  discontinued by ID on 10/22/2022 Infectious disease on board we appreciate input   Hematoma Hematoma Left Neck/Shoulder  Acute Blood Loss Anemia Hypotension due to above and ?septic shock 7/1 - Hbg dropped from 11.5 >> 6.3 and pt noted to have new swelling and ecchymosis at the left neck & upper shoulder.  --Heparin was stopped  --Transfused total 3 units pRBC's and 1 FFP --Transferred to stepdown 7/1 --PCCM consulted for pressors if needed - signed off, BP's stabilized Monitor CBC closely  Rhabdomyolysis-improved CK peaked > 50000 Given aggressive IV hydration for this and hypotension, then devoped hypoxia on 7/1, concern for pulmonary edema but found to have RLL collapse.  Fluids were held. Neurologist on board and case discussed   Acute metabolic encephalopathy CT head on admission negative.  Multifactorial due to metabolic derangements, sepsis / bacteremia, underlying psych conditions Resume home psychiatric meds once safe to take PO Continue delirium precaution -As needed Ativan for severe agitation otherwise we will avoid benzodiazepine use   Acute kidney injury (HCC)-worsening Stage 3a CKD: Baseline Cr 1.17 on 06/04/2022.  Cr on admission 7.78 AKI likely due to severe rhabdomyolysis, sepsis, dehydration, urinary retention secondary to bladder outlet obstruction. Nephrologist on board and case discussed Avoid toxic medications Renally dose all drugs   Acute retention of urine Patient has underlying BPH Foley placed on admission. Hold Cardura given hypotension. Proscar was started on admission Outpatient urology follow-up For voiding trial when patient is improved   NSTEMI (non-ST elevated myocardial infarction) (HCC) Elevated troponin - demand ischemia & delayed clearance with severe AKI. -Cardiology was consulted, signed off --IV heparin stopped 7/1 AM with acute anemia and hematoma --Continue to monitor closely   Fall Unknown cause, pt was found on the floor at  his family care home, per caregiver's history on admission. --PT OT evaluation when clinically improved --Imaging was negative for acute injuries / fractures --Neck hematoma developed 7/1 Confirmed on CT chest and neck soft tissue obtained later on 7/1 PM   COPD (chronic obstructive pulmonary disease) (HCC) Stable -Continue PRN bronchodilators and Mucinex   Hyponatremia Sodium 124 on 10/21/2022 Saline fluids stopped due to hypoxia on 7/1 Monitor sodium level closely Nephrologist following   Hypothyroidism Continue Synthroid     Schizoaffective disorder, bipolar type (HCC) Resume home PO meds when able Allergy listed to haldol, will use IV Ativan PRN if agitation   Depression with anxiety -Resume home Benztropine, BuSpar, clozapine, Depakote, Ingrezza when able to take PO meds   Tobacco abuse Nicotine patch ordered       Disposition: Status is: Inpatient Remains inpatient appropriate because: severity of illness, pt is critically ill with multiple acute issues as above    Planned Discharge Destination: Home     Physical examination:   General exam: Awake but confused HEENT: Ecchymosis and swelling noted at the left neck appears improved  Respiratory system: CTAB, diminished bases Cardiovascular system: normal S1/S2, RRR, trace pedal edema.   Gastrointestinal system: soft, NT, ND Central nervous system: very limited exam due to encephalopathy and incoherent speech, follows commands Extremities: Bilateral upper extremity mittens Skin: dry, intact, normal temperature, pale appearing Psychiatry: exam limited by encephalopathy       Data Reviewed: I reviewed documentation by infectious disease, nephrologist,  palliative care, patient's lab results showing worsening renal function   Family Communication: None present at bedside.  However palliative care discussed with patient's wife and also psychiatry Apparently patient's wife does not have capacity to make decision for  patient as she is acutely sick and being managed in the psych unit and lacks insight.  We may BE pursuing guardianship for patient as he does not have any children to make decisions for him      Vitals:   10/22/22 1300 10/22/22 1400 10/22/22 1500 10/22/22 1600  BP: 134/70 (!) 171/78 (!) 165/79 (!) 162/83  Pulse: 76 91 88 89  Resp: 12 19 19 19   Temp:    (!) 97.5 F (36.4 C)  TempSrc:    Oral  SpO2: 100% 100% 100% 99%  Weight:      Height:         Author: Loyce Dys, MD 10/22/2022 5:51 PM  For on call review [email or web address].

## 2022-10-22 NOTE — Progress Notes (Addendum)
Date of Admission:  10/16/2022     ID: Austin Marsh is a 67 y.o. male  Principal Problem:   Septic shock (HCC) Active Problems:   Acute kidney injury (HCC)   COPD (chronic obstructive pulmonary disease) (HCC)   Hypothyroidism   Depression with anxiety   Schizoaffective disorder, bipolar type (HCC)   Tobacco abuse   Fall   BPH (benign prostatic hyperplasia)   Acute renal failure superimposed on stage 3a chronic kidney disease (HCC)   Acute metabolic encephalopathy   Hyponatremia   NSTEMI (non-ST elevated myocardial infarction) (HCC)   Acute retention of urine   Rhabdomyolysis   Altered mental status   Hematoma   Bacteremia due to Staphylococcus   Acute respiratory failure with hypoxia (HCC)    Subjective: Says he is feeling okay  Medications:   benztropine mesylate  2 mg Intramuscular BID   Or   benztropine  2 mg Oral BID   busPIRone  15 mg Oral BID   Chlorhexidine Gluconate Cloth  6 each Topical Daily   cholecalciferol  2,000 Units Oral Daily   cloZAPine  100 mg Oral Daily   And   cloZAPine  200 mg Oral QHS   finasteride  5 mg Oral Daily   glycopyrrolate  1 mg Oral Daily   levothyroxine  75 mcg Oral Q0600   midodrine  10 mg Oral TID WC   nicotine  21 mg Transdermal Daily   mouth rinse  15 mL Mouth Rinse 4 times per day   pantoprazole  40 mg Oral Daily   senna-docusate  1 tablet Oral BID   traZODone  50 mg Oral QHS   valbenazine  80 mg Oral Daily   vitamin B-12  100 mcg Oral Daily    Objective: Vital signs in last 24 hours: Patient Vitals for the past 24 hrs:  BP Temp Temp src Pulse Resp SpO2 Weight  10/22/22 0800 -- (!) 97.4 F (36.3 C) Oral -- -- -- --  10/22/22 0600 (!) 145/76 -- -- 81 (!) 23 100 % --  10/22/22 0500 (!) 152/69 -- -- 84 (!) 29 100 % 68.3 kg  10/22/22 0400 136/80 -- -- 88 13 96 % --  10/22/22 0300 118/64 -- -- 83 (!) 28 100 % --  10/22/22 0200 (!) 132/59 -- -- 86 (!) 26 99 % --  10/22/22 0100 (!) 145/57 -- -- 87 19 100 % --   10/22/22 0000 (!) 144/66 -- -- 86 (!) 25 100 % --  10/21/22 2300 (!) 145/72 -- -- 90 (!) 28 100 % --  10/21/22 2200 131/71 -- -- 86 (!) 27 100 % --  10/21/22 2100 (!) 104/11 -- -- 85 (!) 23 98 % --  10/21/22 1922 -- 97.7 F (36.5 C) Oral -- -- -- --  10/21/22 1900 136/74 -- -- 85 20 100 % --  10/21/22 1800 127/70 -- -- 87 (!) 22 94 % --  10/21/22 1700 (!) 156/66 -- -- 85 (!) 29 99 % --  10/21/22 1600 114/60 -- -- 76 (!) 22 100 % --  10/21/22 1537 -- -- -- -- -- 100 % --  10/21/22 1500 115/64 -- -- 80 (!) 22 100 % --  10/21/22 1400 (!) 155/68 -- -- 88 (!) 24 100 % --  10/21/22 1300 (!) 158/77 -- -- 89 (!) 23 100 % --  10/21/22 1200 124/76 -- -- 79 16 100 % --      PHYSICAL EXAM:  General: somnolent but  arousable, opened eyes and responed to a few questions- incomprehensible Lungs: b/l air entry Heart: s1s2 Hematoma, bruising left upper scapula area Abdomen: Soft, non-tender,not distended. Bowel sounds normal. No masses Extremities: edema ankles Skin: No rashes or lesions. Or bruising Lymph: Cervical, supraclavicular normal. Neurologic: cannot assess  Lab Results    Latest Ref Rng & Units 10/22/2022    4:09 AM 10/21/2022    1:51 PM 10/21/2022    8:00 AM  CBC  WBC 4.0 - 10.5 K/uL 10.2     Hemoglobin 13.0 - 17.0 g/dL 9.0  8.7  8.6   Hematocrit 39.0 - 52.0 % 25.7  24.6  24.1   Platelets 150 - 400 K/uL 71          Latest Ref Rng & Units 10/22/2022    4:09 AM 10/21/2022    4:05 AM 10/20/2022    1:45 AM  CMP  Glucose 70 - 99 mg/dL 829  92  95   BUN 8 - 23 mg/dL 73  56  43   Creatinine 0.61 - 1.24 mg/dL 5.62  1.30  8.65   Sodium 135 - 145 mmol/L 127  124  127   Potassium 3.5 - 5.1 mmol/L 5.0  4.5  4.3   Chloride 98 - 111 mmol/L 86  87  94   CO2 22 - 32 mmol/L 28  24  23    Calcium 8.9 - 10.3 mg/dL 7.3  6.9  7.1   Total Protein 6.5 - 8.1 g/dL  4.4  4.6   Total Bilirubin 0.3 - 1.2 mg/dL  1.0  0.8   Alkaline Phos 38 - 126 U/L  39  31   AST 15 - 41 U/L  278  480   ALT 0 - 44 U/L   95  120       Microbiology: BC- staph epidermidis and likely diphtheroid ( contaminants)    Assessment/Plan: Fall with severe rhabdomyolysis. The rhabdomyolysis disproportionate to the fall which is a ground-level fall in group home   Severe rhabdomyolysis causing AKI   AKI combination of prerenal, urinary retention, rhabdomyolysis The rhabdo is improving but creatinine is worsening He also received contrast which could have aggravated it Nephrology on board- receiving lasix Awaiting palliative to discuss treatment goals before any intervention as he is doing poorly overall   Staph epidermidis bacteremia in 1 of bottle and gram-positive rod likely diptheroid as per lab Both are skin bacteria and contamination Is highly likely. Patient is currently on linezolid- day 6 Will discontinue  Transaminitis- better   Severe hematoma causing anemia He received PRBC and FFP   Thrombocytopenia   Bipolar disorder, schizoaffective disorder.  Was on many medications.      Discussed the management with the care team  [facility] on call this weekend- available by phone for urgent issues
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
## 2022-10-22 NOTE — Progress Notes (Signed)
SLP Cancellation Note  Patient Details Name: Austin Marsh MRN: [id number] DOB: 1955/04/25   Cancelled treatment:       Reason Eval/Treat Not Completed: Medical issues which prohibited therapy;Patient's level of consciousness;Patient not medically ready (chart reviewed; consulted NSG re: pt's status today and toleration of po Applesauce for Meds.) Pt is not demonstrating appropriate alertness/awareness at this hour for po trials to upgrade to oral diet in setting of his mental status. NSG agreed. ST services will f/u tomorrow.  Recommend strict aspiration precautions any po Applesauce w/ Meds. NSG agreed.      Jerilynn Som, MS, CCC-SLP Speech Language Pathologist Rehab Services; [facility] [phone] (ascom) Leylani Duley 10/22/2022, 3:51 PM

## 2022-10-23 DIAGNOSIS — N170 Acute kidney failure with tubular necrosis: Secondary | ICD-10-CM | POA: Diagnosis not present

## 2022-10-23 DIAGNOSIS — R6521 Severe sepsis with septic shock: Secondary | ICD-10-CM | POA: Diagnosis not present

## 2022-10-23 DIAGNOSIS — A419 Sepsis, unspecified organism: Secondary | ICD-10-CM | POA: Diagnosis not present

## 2022-10-23 LAB — CBC WITH DIFFERENTIAL/PLATELET
Abs Immature Granulocytes: 0.08 10*3/uL — ABNORMAL HIGH (ref 0.00–0.07)
Basophils Absolute: 0 10*3/uL (ref 0.0–0.1)
Basophils Relative: 0 %
Eosinophils Absolute: 0.1 10*3/uL (ref 0.0–0.5)
Eosinophils Relative: 2 %
HCT: 21.3 % — ABNORMAL LOW (ref 39.0–52.0)
Hemoglobin: 7.5 g/dL — ABNORMAL LOW (ref 13.0–17.0)
Immature Granulocytes: 1 %
Lymphocytes Relative: 18 %
Lymphs Abs: 1.3 10*3/uL (ref 0.7–4.0)
MCH: 30.9 pg (ref 26.0–34.0)
MCHC: 35.2 g/dL (ref 30.0–36.0)
MCV: 87.7 fL (ref 80.0–100.0)
Monocytes Absolute: 1.1 10*3/uL — ABNORMAL HIGH (ref 0.1–1.0)
Monocytes Relative: 15 %
Neutro Abs: 4.7 10*3/uL (ref 1.7–7.7)
Neutrophils Relative %: 64 %
Platelets: 70 10*3/uL — ABNORMAL LOW (ref 150–400)
RBC: 2.43 MIL/uL — ABNORMAL LOW (ref 4.22–5.81)
RDW: 14.6 % (ref 11.5–15.5)
WBC: 7.4 10*3/uL (ref 4.0–10.5)
nRBC: 0 % (ref 0.0–0.2)

## 2022-10-23 LAB — BASIC METABOLIC PANEL
Anion gap: 15 (ref 5–15)
BUN: 91 mg/dL — ABNORMAL HIGH (ref 8–23)
CO2: 27 mmol/L (ref 22–32)
Calcium: 7.1 mg/dL — ABNORMAL LOW (ref 8.9–10.3)
Chloride: 83 mmol/L — ABNORMAL LOW (ref 98–111)
Creatinine, Ser: 9.04 mg/dL — ABNORMAL HIGH (ref 0.61–1.24)
GFR, Estimated: 6 mL/min — ABNORMAL LOW (ref >60)
Glucose, Bld: 78 mg/dL (ref 70–99)
Potassium: 5.3 mmol/L — ABNORMAL HIGH (ref 3.5–5.1)
Sodium: 125 mmol/L — ABNORMAL LOW (ref 135–145)

## 2022-10-23 LAB — CK: Total CK: 1354 U/L — ABNORMAL HIGH (ref 49–397)

## 2022-10-23 MED ORDER — PENTAFLUOROPROP-TETRAFLUOROETH EX AERO: 1.0000 | INHALATION_SPRAY | CUTANEOUS | Status: DC | PRN

## 2022-10-23 MED ORDER — ANTICOAGULANT SODIUM CITRATE 4% (200MG/5ML) IV SOLN: 5.0000 mL | Status: DC | PRN

## 2022-10-23 MED ORDER — HEPARIN SODIUM (PORCINE) 1000 UNIT/ML DIALYSIS
1000.0000 [IU] | INTRAMUSCULAR | Status: DC | PRN
  Administered 2022-10-30 (×4): 1000 [IU]

## 2022-10-23 MED ORDER — LIDOCAINE HCL 1 % IJ SOLN
INTRAMUSCULAR | Status: AC
  Administered 2022-10-23: 10 mL
  Filled 2022-10-23: qty 10

## 2022-10-23 MED ORDER — LIDOCAINE HCL (PF) 1 % IJ SOLN: 5.0000 mL | INTRAMUSCULAR | Status: DC | PRN

## 2022-10-23 MED ORDER — ALTEPLASE 2 MG IJ SOLR: 2.0000 mg | Freq: Once | INTRAMUSCULAR | Status: DC | PRN

## 2022-10-23 MED ORDER — LIDOCAINE-PRILOCAINE 2.5-2.5 % EX CREA: 1.0000 | TOPICAL_CREAM | CUTANEOUS | Status: DC | PRN

## 2022-10-23 MED ORDER — LIDOCAINE HCL 1 % IJ SOLN: 10.0000 mL | Freq: Once | INTRAMUSCULAR | Status: DC

## 2022-10-23 NOTE — Progress Notes (Signed)
Notified by nursing staff that family members at bedside visiting. Family present brother and sister-in-law to Mr. Cartmell wife-Ellen. Family present shared with nursing staff, Mr. Causby has a sister that is a judge in a nearby county-family could not provide contact information for sister. Nursing staff also questioning ability to provide Mr. Su Hilt group home staff with updates.  All information passed along to [facility], LCSW who assisted nursing staff with information.  TOC continues to work with DSS in establishing person of contact.  No Charge.  Leeanne Deed, DNP, AGNP-C Palliative Medicine  Please call Palliative Medicine team phone with any questions [phone]. For individual providers please see AMION.

## 2022-10-23 NOTE — TOC Progression Note (Addendum)
Transition of Care [facility]) - Progression Note    Patient Details  Name: Geremias Felton MRN: [id number] Date of Birth: 08/25/1955  [facility] CM/SW Contact  Liliana Cline, LCSW Phone Number: 10/23/2022, 1:45 PM  Clinical Narrative:    Spoke with Palliative NP with updates. Then called and requested call from APS Worker on call.  Need to notify APS that there are siblings that may be next of kin but no names/contact info provided, and that sister/brother in laws did visit today. Also need to inquire if staff can update the group home staff that keeps calling to floor.         Expected Discharge Plan and Services                                               Social Determinants of Health (SDOH) Interventions SDOH Screenings   Food Insecurity: No Food Insecurity (10/17/2022)  Housing: Patient Unable To Answer (10/17/2022)  Tobacco Use: High Risk (10/16/2022)    Readmission Risk Interventions    03/17/2020    1:48 PM  Readmission Risk Prevention Plan  Transportation Screening Complete  Palliative Care Screening Not Applicable  Medication Review (RN Care Manager) Complete

## 2022-10-23 NOTE — Progress Notes (Signed)
SLP Cancellation Note  Patient Details Name: Austin Marsh MRN: [id number] DOB: 11/25/1955   Cancelled treatment:       Reason Eval/Treat Not Completed: Medical issues which prohibited therapy;Patient's level of consciousness;Patient not medically ready (chart reviewed; pt's mental status is not improving for safe consideration of upgrade to oral diet today.) Pt continues to have mental status decline, yelling out. Pt remains at increased risk for aspiration and is not yet safe for consideration of upgrade to an oral diet. Recommend continue strict aspiration precautions w/ Applesauce and Crushed meds given by NSG.  ST services will f/u next 1-2 days.      Jerilynn Som, MS, CCC-SLP Speech Language Pathologist Rehab Services; [facility] [phone] (ascom) [name] 10/23/2022, 12:13 PM

## 2022-10-23 NOTE — Procedures (Signed)
    Patient name: Austin Marsh MRN: [id number] DOB: 04/22/1955 Sex: male   Pre-operative Diagnosis: Acute renal failure Post-operative diagnosis:  Same Surgeon:  Durene Cal Procedure:   Ultrasound-guided placement of right femoral vein temporary dialysis catheter  Indications: This is a 67 year old gentleman with acute renal failure.  He is unable to provide consent.  This was done under emergency consent.  Procedure:    The right groin was prepped and draped in standard sterile fashion.  Ultrasound was used to evaluate the right common femoral vein which was widely patent and easily compressible.  1% lidocaine was used for local anesthesia.  The right common femoral vein was then cannulated under ultrasound guidance with an 18-gauge needle.  A 035 wire was then inserted without resistance.  The subcutaneous tract was dilated with a dilator and a dual-lumen 20 cm dialysis catheter was placed.  Both ports flushed and aspirated without difficulty.  The catheter was sutured in position with nylon.  Sterile dressings were applied.  There were no immediate complications.   Juleen China, M.D., [facility] [place] Office: [phone] Pager:  [phone]

## 2022-10-23 NOTE — Progress Notes (Signed)
Progress Note   Patient: Austin Marsh VHQ:[id number] DOB: [birth year] DOA: 10/16/2022     6 DOS: the patient was seen and examined on 10/23/2022     Subjective:  Patient seen and met at bedside this morning Renal function is still worsened today I discussed with nephrology team and we will initiate hemodialysis at this time Caseworkers working on contacting any other family members.   Brief hospital course: "Austin Marsh is a 67 y.o. male with medical history significant of COPD, hypothyroidism, depression with anxiety, BPH, CKD-3A, schizoaffective disorder with bipolar, tardive dyskinesia, OSA on CPAP, who presents with fall and AMS."  Caregiver at family care facility.  reported pt was in his normal health condition until 10:30 PM yesterday when pt had fall and was found on the floor. At normal baseline, patient is alert oriented x 3 but today is confused.  On admission, pt was lethargic, oriented to person and place, not to year or situation.  He reported right hip pain with moving right leg, low back pain, some chest pain which he was unable to characterize further, constipation, suprapubic pain.  He was found to have acute urinary retention (973 cc on bladder scan).  Foley catheter was placed.     In the ED - fever of 101, HR 123, RR 26, stable O2 sat on room air.  Per EMS report, patient had low BP, but at time of admission BP was stable at 124/73.   Chest x-ray negative.   CT of head negative for acute intracranial abnormalities.   CT Chest/Abd/Pelvis: Moderate coronary artery calcification. No acute intrathoracic or intra-abdominal pathology identified. Stable 9 mm right adrenal adenoma. Stable changes of adrenal hyperplasia. Marked distention of the bladder possibly reflecting changes of bladder outlet obstruction. Mild prostatic hypertrophy.  Probable post TURP changes.  X-ray of bilateral hips/pelvis negative for fracture.   X-ray of L-spine and T-spine negative for acute fracture,  did showed degenerative disc disease    Notable labs - WBC 11.2, severe AKI with Cr 7.78, sodium 123, negative COVID PCR, lactic acid> 9.0 ---> 4.9, troponin level 172, 406, 804, 784.  Sodium 123, magnesium 2.3,  Hemoglobin 11.5.   Pt was admitted to PCU. Subsequently found to have severe rhabdomyolysis with CK peaking above 50k. IV heparin was started and cardiology consulted due to troponin elevation.   7/1 -- pt hypotensive, requiring IV fluid boluses.  Worsening encephalopathy, Hbg dropped to 6.3, large area of ecchymosis and swelling in the left neck & upper shoulder concerning for hematoma, later confirmed on CT check and neck soft tissue.  Pt was transferred to stepdown for developing shock. PCCM consulted.  Transfused 2 units pRBC's, 1 FFP.   7/2 -- BP's improved after 3 units pRBC's, 1 FFP total.  Hbg improved from nadir 5.4 to 10.1 >> 9.3 this AM.  +Blood cultures with GPC's and GPR's.  ID consulted.  CK trending down. Nephrology consulted with worsening renal function and poor urine output reported.   Assessment and Plan:   Acute kidney injury (HCC)-worsening Stage 3a CKD: Baseline Cr 1.17 on 06/04/2022.  Cr on admission 7.78 AKI likely due to severe rhabdomyolysis, sepsis, dehydration, urinary retention secondary to bladder outlet obstruction. Case discussed with nephrologist Avoid nephrotoxic medications Vascular surgeon to place hemodialysis catheter today Nephrologist planning on initiating hemodialysis today We will continue to place even other family members to discuss goals of care   * Septic shock (HCC)-improved On admission, tachycardic, tachypneic, febrile. Lactic acid was above 9 meeting  septic shock criteria (with dehydration, AKI, rhabdo contributing).  Hypotension seemed more due to acute blood loss 2/2 left neck hematoma.  BP responded to fluids, blood products and midodrine, did not require pressors. Has bacteremia and RLL pneumonia suspect acute aspiration on 7/1  while encephalopathic  Patient completed a course of Zyvox and currently antibiotics have been discontinued by infectious disease specialist we appreciate input Continue to monitor white count closely   Acute respiratory failure with hypoxia (HCC) Right lower lobe pneumonia, not POA, suspect acute aspiration event on 7/1. Antibiotics completed and discontinued by ID on 10/22/2022 --Wean O2 as tolerated, maintain spO2 >90% Patient requiring 2 L of intranasal oxygen, we will wean off as tolerated   Bacteremia due to Staphylococcus Initial blood cx +staph epidermidis felt contaminant.  Antibiotics completed and discontinued by ID on 10/22/2022 Infectious disease on board we appreciate input   Hematoma-resolved Hematoma Left Neck/Shoulder  Acute Blood Loss Anemia Hypotension due to above and ?septic shock 7/1 - Hbg dropped from 11.5 >> 6.3 and pt noted to have new swelling and ecchymosis at the left neck & upper shoulder.  --Heparin was stopped  --Transfused total 3 units pRBC's and 1 FFP --Transferred to stepdown 7/1 --PCCM consulted for pressors if needed - signed off, BP's stabilized Monitor CBC closely   Rhabdomyolysis-improved CK peaked > 50000 Given aggressive IV hydration for this and hypotension, then devoped hypoxia on 7/1, concern for pulmonary edema but found to have RLL collapse.  Fluids were held. Neurologist on board and case discussed   Acute metabolic encephalopathy CT head on admission negative.  Multifactorial due to metabolic derangements, sepsis / bacteremia, underlying psych conditions Resume home psychiatric meds once safe to take PO Continue delirium precaution -As needed Ativan for severe agitation otherwise we will avoid benzodiazepine use     Acute retention of urine Patient has underlying BPH Foley placed on admission. Hold Cardura given hypotension. Proscar was started on admission Outpatient urology follow-up For voiding trial when patient is  improved   NSTEMI (non-ST elevated myocardial infarction) (HCC) Elevated troponin - demand ischemia & delayed clearance with severe AKI. -Cardiology was consulted, signed off --IV heparin stopped 7/1 AM with acute anemia and hematoma --Continue to monitor closely   Fall Unknown cause, pt was found on the floor at his family care home, per caregiver's history on admission. --PT OT evaluation when clinically improved --Imaging was negative for acute injuries / fractures --Neck hematoma developed 7/1 Confirmed on CT chest and neck soft tissue obtained later on 7/1 PM   COPD (chronic obstructive pulmonary disease) (HCC) Stable -Continue PRN bronchodilators and Mucinex   Hyponatremia Sodium 124 on 10/21/2022 Saline fluids stopped due to hypoxia on 7/1 Monitor sodium level closely Nephrologist following   Hypothyroidism Continue Synthroid     Schizoaffective disorder, bipolar type (HCC) Resume home PO meds when able Allergy listed to haldol, will use IV Ativan PRN if agitation   Depression with anxiety -Resume home Benztropine, BuSpar, clozapine, Depakote, Ingrezza when able to take PO meds   Tobacco abuse Nicotine patch ordered       Disposition: Status is: Inpatient Remains inpatient appropriate because: severity of illness, pt is critically ill with multiple acute issues as above    Planned Discharge Destination: Home     Physical examination:   General exam: Still laying in bed able to make some conversation but confused HEENT: Ecchymosis and swelling noted at the left neck appears improved  Respiratory system: CTAB, diminished bases Cardiovascular system:  normal S1/S2, RRR, trace pedal edema.   Gastrointestinal system: soft, NT, ND Central nervous system: very limited exam due to encephalopathy and incoherent speech, follows commands Extremities: Bilateral upper extremity mittens Skin: dry, intact, normal temperature, pale appearing Psychiatry: exam limited by  encephalopathy       Data Reviewed: I have reviewed patient's chart including vitals, labs nephrology documentation as well as vascular surgery documentation   Family Communication:  Spoke with case management who also spoke with patient's group home and apparently patient's may have a sister. We are trying to get in touch with patient's sister to help with decision making   Vitals:   10/23/22 1000 10/23/22 1100 10/23/22 1200 10/23/22 1300  BP: (!) 142/61 (!) 129/58 (!) 153/70 (!) 143/66  Pulse: 88 90 94 95  Resp: 16 17 20 20   Temp:      TempSrc:      SpO2: 99% 96% 96% 96%  Weight:      Height:        Author: Loyce Dys, MD 10/23/2022 3:04 PM  For on call review [email or web address].

## 2022-10-23 NOTE — Progress Notes (Signed)
Received patient in bed   Responds to voice. Emergent dialysis consent  t/o my on call doctor with 2 RN witness.    TX duration:2 hours  Patient tolerated well.  without acute distress.  Hand-off given to patient's nurse.   Access used: dialysis cath Access issues: none  Total UF removed: 0 Medication(s) given: heplock 1.3cc per port Post HD VS: see table below Post HD weight: 101.4kg    10/23/22 2135  Vitals  Temp 98.8 F (37.1 C)  Temp Source Axillary  BP (!) 143/68  MAP (mmHg) 89  BP Location Right Arm  BP Method Automatic  Patient Position (if appropriate) Lying  Pulse Rate 89  Pulse Rate Source Monitor  ECG Heart Rate 90  Resp (!) 22  Oxygen Therapy  SpO2 100 %  O2 Device Nasal Cannula  O2 Flow Rate (L/min) 2 L/min  Patient Activity (if Appropriate) In bed  Pulse Oximetry Type Continuous  During Treatment Monitoring  HD Safety Checks Performed Yes  Intra-Hemodialysis Comments Tolerated well  Post Treatment  Dialyzer Clearance Lightly streaked  Duration of HD Treatment -hour(s) 2 hour(s)  Hemodialysis Intake (mL) 0 mL  Liters Processed 24  Fluid Removed (mL) 2 mL  Tolerated HD Treatment Yes  Hemodialysis Catheter Right Femoral vein Double lumen Temporary (Non-Tunneled)  Placement Date/Time: 10/23/22 1100   Placed prior to admission: No  Time Out: Correct patient;Correct site;Correct procedure  Maximum sterile barrier precautions: Hand hygiene;Cap;Mask;Sterile gown;Sterile gloves;Large sterile sheet  Site Prep: Chlorh...  Site Condition No complications  Blue Lumen Status Flushed;Heparin locked;Dead end cap in place  Red Lumen Status Flushed;Heparin locked;Dead end cap in place  Purple Lumen Status N/A  Catheter fill solution Heparin 1000 units/ml  Catheter fill volume (Arterial) 1.3 cc  Catheter fill volume (Venous) 1.3  Post treatment catheter status Capped and Clamped      [place] D[facility] Unit

## 2022-10-23 NOTE — Progress Notes (Signed)
[facility]  ROUNDING NOTE   Subjective:   Patient remains in ICU Incoherent yelling Weaned to 2L [name] Generalized edema  Foley catheter, on day shift yesterday.   Creatinine 9.04  Objective:  Vital signs in last 24 hours:  Temp:  [97.5 F (36.4 C)-97.6 F (36.4 C)] 97.5 F (36.4 C) (07/05 1600) Pulse Rate:  [76-94] 87 (07/06 0800) Resp:  [10-53] 30 (07/06 0800) BP: (121-172)/(55-83) 128/55 (07/06 0800) SpO2:  [98 %-100 %] 98 % (07/06 0800)  Weight change:  Filed Weights   10/20/22 0702 10/21/22 0500 10/22/22 0500  Weight: 91.1 kg 71.1 kg 68.3 kg    Intake/Output: I/O last 3 completed shifts: In: 2018.3 [I.V.:868.2; IV Piggyback:1150.1] Out: 425 [Urine:425]   Intake/Output this shift:  No intake/output data recorded.  Physical Exam: General: Ill appearing  Head: Normocephalic, atraumatic. Dry oral mucosal membranes  Eyes: Anicteric  Lungs:  Faint wheeze present, Sor[place]  Heart: Regular rate and rhythm  Abdomen:  Soft, nontender  Extremities:  2+ generalized edema.  Neurologic: minimally responsive to voice  Skin: Weeping edema  Access: None  Foley in place  Basic Metabolic Panel: Recent Labs  Lab 10/17/22 0500 10/17/22 0725 10/18/22 1135 10/18/22 2122 10/19/22 0210 10/19/22 1426 10/20/22 0145 10/21/22 0405 10/22/22 0409 10/23/22 0521  NA  --    < >  --    < > 127* 127* 127* 124* 127* 125*  K  --    < >  --    < > 4.4 4.4 4.3 4.5 5.0 5.3*  CL  --    < >  --    < > 98 96* 94* 87* 86* 83*  CO2  --    < >  --    < > 17* 20* 23 24 28 27   GLUCOSE  --    < >  --    < > 117* 102* 95 92 103* 78  BUN  --    < >  --    < > 33* 39* 43* 56* 73* 91*  CREATININE  --    < >  --    < > 4.22* 5.14* 5.65* 6.98* 7.66* 9.04*  CALCIUM  --    < >  --    < > 7.0* 7.1* 7.1* 6.9* 7.3* 7.1*  MG 2.3  --  1.9  --  1.8  --   --   --   --   --   PHOS 3.5  --  5.4*  --  5.8*  --   --   --   --   --    < > = values in this interval not displayed.     Liver  Function Tests: Recent Labs  Lab 10/16/22 2344 10/18/22 0655 10/19/22 0210 10/20/22 0145 10/21/22 0405  AST 30 770* 606* 480* 278*  ALT 10 134* 124* 120* 95*  ALKPHOS 44 22* 25* 31* 39  BILITOT 0.6 0.6 1.0 0.8 1.0  PROT 6.0* 4.0* 4.3* 4.6* 4.4*  ALBUMIN 3.3* 2.3* 2.5* 2.4* 1.9*    Recent Labs  Lab 10/16/22 2344  LIPASE 38    No results for input(s): "AMMONIA" in the last 168 hours.  CBC: Recent Labs  Lab 10/16/22 2344 10/18/22 1610 10/18/22 1135 10/18/22 2122 10/19/22 0210 10/19/22 0759 10/19/22 1426 10/20/22 0145 10/20/22 0755 10/21/22 0405 10/21/22 0800 10/21/22 1351 10/22/22 0409 10/23/22 0521  WBC 11.2*   < > 10.6* 10.3   < > 11.6*  --  11.3*  --  10.8*  --   --  10.2 7.4  NEUTROABS 6.8  --  8.1* 7.4  --   --   --   --   --   --   --   --  8.3* 4.7  HGB 11.5*   < > 5.4* 10.1*   < > 9.0*   < > 8.8*   < > 8.6* 8.6* 8.7* 9.0* 7.5*  HCT 34.3*   < > 16.3* 28.4*   < > 25.2*   < > 25.1*   < > 24.3* 24.1* 24.6* 25.7* 21.3*  MCV 96.6   < > 97.6 87.7   < > 85.7  --  87.2  --  87.4  --   --  87.7 87.7  PLT 165   < > 92* 83*   < > 82*  --  81*  --  69*  --   --  71* 70*   < > = values in this interval not displayed.     Cardiac Enzymes: Recent Labs  Lab 10/19/22 0210 10/20/22 0145 10/21/22 0405 10/22/22 0409 10/23/22 0521  CKTOTAL 19,573* 12,021* 5,100* 2,838* 1,354*     BNP: Invalid input(s): "POCBNP"  CBG: Recent Labs  Lab 10/17/22 1737 10/18/22 0836  GLUCAP 121* 172*     Microbiology: Results for orders placed or performed during the hospital encounter of 10/16/22  Blood Culture (routine x 2)     Status: None (Preliminary result)   Collection Time: 10/16/22 11:44 PM   Specimen: BLOOD LEFT ARM  Result Value Ref Range Status   Specimen Description   Final    BLOOD LEFT ARM Performed at [facility], [address] [place], [place] [address]    Special Requests   Final    BOTTLES DRAWN AEROBIC AND ANAEROBIC Blood Culture  adequate volume Performed at [facility], [address], [place], [place] [address]    Culture  Setup Time   Final    GRAM POSTIVE ORGANISM AEROBIC BOTTLE ONLY CRITICAL VALUE NOTED.  VALUE IS CONSISTENT WITH PREVIOUSLY REPORTED AND CALLED VALUE.    Culture   Final    CULTURE REINCUBATED FOR BETTER GROWTH Performed at [facility], [address] [place], [place] [address]    Report Status PENDING  Incomplete  Blood Culture (routine x 2)     Status: Abnormal (Preliminary result)   Collection Time: 10/16/22 11:44 PM   Specimen: BLOOD RIGHT ARM  Result Value Ref Range Status   Specimen Description   Final    BLOOD RIGHT ARM Performed at [facility], [address], [place], [place] [address]    Special Requests   Final    BOTTLES DRAWN AEROBIC AND ANAEROBIC Blood Culture adequate volume Performed at [facility], [address] [place], [place] [address]    Culture  Setup Time   Final    GRAM POSITIVE COCCI ANAEROBIC BOTTLE ONLY CRITICAL RESULT CALLED TO, READ BACK BY AND VERIFIED WITH: NATHAN BELUE @ 2253 10/17/22 [facility] GRAM POSITIVE RODS AEROBIC BOTTLE ONLY CRITICAL RESULT CALLED TO, READ BACK BY AND VERIFIED WITH: JASON ROBBINS@0310  10/19/22 RH    Culture (A)  Final    STAPHYLOCOCCUS EPIDERMIDIS THE SIGNIFICANCE OF ISOLATING THIS ORGANISM FROM A SINGLE SET OF BLOOD CULTURES WHEN MULTIPLE SETS ARE DRAWN IS UNCERTAIN. PLEASE NOTIFY THE MICROBIOLOGY DEPARTMENT WITHIN ONE WEEK IF SPECIATION AND SENSITIVITIES ARE REQUIRED. CULTURE REINCUBATED FOR BETTER GROWTH Performed at [facility], [address], [place], [place] [address]    Report Status PENDING  Incomplete  SARS Coronavirus 2 by RT PCR (hospital order, performed in [facility] hospital lab) *cepheid single result test* Anterior Nasal Swab     Status: None   Collection Time: 10/16/22 11:44 PM   Specimen: Anterior Nasal Swab  Result Value Ref Range Status   SARS Coronavirus 2 by  RT PCR NEGATIVE NEGATIVE Final    Comment: (NOTE) SARS-CoV-2 target nucleic acids are NOT DETECTED.  The SARS-CoV-2 RNA is generally detectable in upper and lower respiratory specimens during the acute phase of infection. The lowest concentration of SARS-CoV-2 viral copies this assay can detect is 250 copies / mL. A negative result does not preclude SARS-CoV-2 infection and should not be used as the sole basis for treatment or other patient management decisions.  A negative result may occur with improper specimen collection / handling, submission of specimen other than nasopharyngeal swab, presence of viral mutation(s) within the areas targeted by this assay, and inadequate number of viral copies (<250 copies / mL). A negative result must be combined with clinical observations, patient history, and epidemiological information.  Fact Sheet for Patients:   RoadLapTop.co.za  Fact Sheet for Healthcare Providers: http://kim-miller.com/  This test is not yet approved or  cleared by the [place] FDA and has been authorized for detection and/or diagnosis of SARS-CoV-2 by FDA under an Emergency Use Authorization (EUA).  This EUA will remain in effect (meaning this test can be used) for the duration of the COVID-19 declaration under Section 564(b)(1) of the Act, 21 U.S.C. section 360bbb-3(b)(1), unless the authorization is terminated or revoked sooner.  Performed at [facility], [address] [place], [place] [address]   Blood Culture ID Panel (Reflexed)     Status: Abnormal   Collection Time: 10/16/22 11:44 PM  Result Value Ref Range Status   Enterococcus faecalis NOT DETECTED NOT DETECTED Final   Enterococcus Faecium NOT DETECTED NOT DETECTED Final   Listeria monocytogenes NOT DETECTED NOT DETECTED Final   Staphylococcus species DETECTED (A) NOT DETECTED Final    Comment: CRITICAL RESULT CALLED TO, READ BACK BY AND VERIFIED  WITH: NATHAN BELUE @ 2253 10/17/22 [facility]    Staphylococcus aureus (BCID) NOT DETECTED NOT DETECTED Final   Staphylococcus epidermidis DETECTED (A) NOT DETECTED Final    Comment: Methicillin (oxacillin) resistant coagulase negative staphylococcus. Possible blood culture contaminant (unless isolated from more than one blood culture draw or clinical case suggests pathogenicity). No antibiotic treatment is indicated for blood  culture contaminants. CRITICAL RESULT CALLED TO, READ BACK BY AND VERIFIED WITH: NATHAN BELUE @ 2253 10/17/22 [facility]    Staphylococcus lugdunensis NOT DETECTED NOT DETECTED Final   Streptococcus species NOT DETECTED NOT DETECTED Final   Streptococcus agalactiae NOT DETECTED NOT DETECTED Final   Streptococcus pneumoniae NOT DETECTED NOT DETECTED Final   Streptococcus pyogenes NOT DETECTED NOT DETECTED Final   A.calcoaceticus-baumannii NOT DETECTED NOT DETECTED Final   Bacteroides fragilis NOT DETECTED NOT DETECTED Final   Enterobacterales NOT DETECTED NOT DETECTED Final   Enterobacter cloacae complex NOT DETECTED NOT DETECTED Final   Escherichia coli NOT DETECTED NOT DETECTED Final   Klebsiella aerogenes NOT DETECTED NOT DETECTED Final   Klebsiella oxytoca NOT DETECTED NOT DETECTED Final   Klebsiella pneumoniae NOT DETECTED NOT DETECTED Final   Proteus species NOT DETECTED NOT DETECTED Final   Salmonella species NOT DETECTED NOT DETECTED Final   Serratia marcescens NOT DETECTED NOT DETECTED Final  Haemophilus influenzae NOT DETECTED NOT DETECTED Final   Neisseria meningitidis NOT DETECTED NOT DETECTED Final   Pseudomonas aeruginosa NOT DETECTED NOT DETECTED Final   Stenotrophomonas maltophilia NOT DETECTED NOT DETECTED Final   Candida albicans NOT DETECTED NOT DETECTED Final   Candida auris NOT DETECTED NOT DETECTED Final   Candida glabrata NOT DETECTED NOT DETECTED Final   Candida krusei NOT DETECTED NOT DETECTED Final   Candida parapsilosis NOT DETECTED NOT DETECTED  Final   Candida tropicalis NOT DETECTED NOT DETECTED Final   Cryptococcus neoformans/gattii NOT DETECTED NOT DETECTED Final   Methicillin resistance mecA/C DETECTED (A) NOT DETECTED Final    Comment: CRITICAL RESULT CALLED TO, READ BACK BY AND VERIFIED WITH: NATHAN BELUE @ 2253 10/17/22 [facility] Performed at [facility], [address] [place], [place] [address]   Respiratory (~20 pathogens) panel by PCR     Status: None   Collection Time: 10/17/22  9:26 AM   Specimen: Nasopharyngeal Swab; Respiratory  Result Value Ref Range Status   Adenovirus NOT DETECTED NOT DETECTED Final   Coronavirus 229E NOT DETECTED NOT DETECTED Final    Comment: (NOTE) The Coronavirus on the Respiratory Panel, DOES NOT test for the novel  Coronavirus (2019 nCoV)    Coronavirus HKU1 NOT DETECTED NOT DETECTED Final   Coronavirus NL63 NOT DETECTED NOT DETECTED Final   Coronavirus OC43 NOT DETECTED NOT DETECTED Final   Metapneumovirus NOT DETECTED NOT DETECTED Final   Rhinovirus / Enterovirus NOT DETECTED NOT DETECTED Final   Influenza A NOT DETECTED NOT DETECTED Final   Influenza B NOT DETECTED NOT DETECTED Final   Parainfluenza Virus 1 NOT DETECTED NOT DETECTED Final   Parainfluenza Virus 2 NOT DETECTED NOT DETECTED Final   Parainfluenza Virus 3 NOT DETECTED NOT DETECTED Final   Parainfluenza Virus 4 NOT DETECTED NOT DETECTED Final   Respiratory Syncytial Virus NOT DETECTED NOT DETECTED Final   Bordetella pertussis NOT DETECTED NOT DETECTED Final   Bordetella Parapertussis NOT DETECTED NOT DETECTED Final   Chlamydophila pneumoniae NOT DETECTED NOT DETECTED Final   Mycoplasma pneumoniae NOT DETECTED NOT DETECTED Final    Comment: Performed at [facility], [address], [place], [place] [address]  MRSA Next Gen by PCR, Nasal     Status: None   Collection Time: 10/18/22 10:53 AM   Specimen: Nasal Mucosa; Nasal Swab  Result Value Ref Range Status   MRSA by PCR Next Gen NOT DETECTED NOT  DETECTED Final    Comment: (NOTE) The GeneXpert MRSA Assay (FDA approved for NASAL specimens only), is one component of a comprehensive MRSA colonization surveillance program. It is not intended to diagnose MRSA infection nor to guide or monitor treatment for MRSA infections. Test performance is not FDA approved in patients less than 80 years old. Performed at [facility], [address] [place], [place] [address]     Coagulation Studies: No results for input(s): "LABPROT", "INR" in the last 72 hours.   Urinalysis: No results for input(s): "COLORURINE", "LABSPEC", "PHURINE", "GLUCOSEU", "HGBUR", "BILIRUBINUR", "KETONESUR", "PROTEINUR", "UROBILINOGEN", "NITRITE", "LEUKOCYTESUR" in the last 72 hours.  Invalid input(s): "APPERANCEUR"    Imaging: No results found.   Medications:    sodium bicarbonate 150 mEq in sterile water 1,150 mL infusion 35 mL/hr at 10/22/22 1855    benztropine mesylate  2 mg Intramuscular BID   Or   benztropine  2 mg Oral BID   busPIRone  15 mg Oral BID   Chlorhexidine Gluconate Cloth  6  each Topical Daily   cholecalciferol  2,000 Units Oral Daily   cloZAPine  100 mg Oral Daily   And   cloZAPine  200 mg Oral QHS   finasteride  5 mg Oral Daily   glycopyrrolate  1 mg Oral Daily   levothyroxine  75 mcg Oral Q0600   midodrine  10 mg Oral TID WC   nicotine  21 mg Transdermal Daily   mouth rinse  15 mL Mouth Rinse 4 times per day   pantoprazole  40 mg Oral Daily   senna-docusate  1 tablet Oral BID   traZODone  50 mg Oral QHS   valbenazine  80 mg Oral Daily   vitamin B-12  100 mcg Oral Daily   acetaminophen **OR** acetaminophen, albuterol, alum & mag hydroxide-simeth, bismuth subsalicylate, guaiFENesin, loperamide, LORazepam, magnesium hydroxide, magnesium hydroxide, meclizine, methocarbamol, morphine injection, ondansetron **OR** ondansetron (ZOFRAN) IV, mouth rinse, oxyCODONE-acetaminophen, polyethylene glycol, sodium  chloride  Assessment/ Plan:  Austin Marsh is a 67 y.o.  male with past medical history of hypothyroidism, depression with anxiety, schizoaffective, and COPD, who was admitted to [facility] on 10/16/2022 for Lactic acidosis [E87.20] Elevated troponin level [R79.89] Demand ischemia [I24.89] NSTEMI (non-ST elevated myocardial infarction) (HCC) [I21.4] Acute kidney injury (HCC) [N17.9] Septic shock (HCC) [A41.9, R65.21] Sepsis due to undetermined organism (HCC) [A41.9] Altered mental status, unspecified altered mental status type [R41.82] SIRS (systemic inflammatory response syndrome) (HCC) [R65.10]   Acute kidney injury with hyponatremia likely secondary to rhabdomyolysis.  Patient has also developed volume overload. CK greater than 29,000 on admission.Although creatinine continues to rise, CK is improving. Mentation baseline unknown.   - Creatinine substantially worse - Primary team seeking guardianship - Discussed with primary team and we will pursue dialysis. Vascular team consulted to place HD temp cath.  - Will provide initial dialysis treatment after placement.    Lab Results  Component Value Date   CREATININE 9.04 (H) 10/23/2022   CREATININE 7.66 (H) 10/22/2022   CREATININE 6.98 (H) 10/21/2022    Intake/Output Summary (Last 24 hours) at 10/23/2022 0919 Last data filed at 10/22/2022 1855 Gross per 24 hour  Intake 645.24 ml  Output 100 ml  Net 545.24 ml    2. Anemia of acute blood loss:  Lab Results  Component Value Date   HGB 7.5 (L) 10/23/2022  Has received blood transfusions during this admission. Hgb not within desired range.   3. Acute respiratory failure requiring HFNC, right lower lobe pneumonia noted with positive blood culture, bacteremia. Receiving Linezolid. ID following. Now weaned to 2L [place]      LOS: 6   7/6/20249:19 AM
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
## 2022-10-24 DIAGNOSIS — R6521 Severe sepsis with septic shock: Secondary | ICD-10-CM | POA: Diagnosis not present

## 2022-10-24 DIAGNOSIS — A419 Sepsis, unspecified organism: Secondary | ICD-10-CM | POA: Diagnosis not present

## 2022-10-24 LAB — BASIC METABOLIC PANEL
Anion gap: 13 (ref 5–15)
BUN: 80 mg/dL — ABNORMAL HIGH (ref 8–23)
CO2: 29 mmol/L (ref 22–32)
Calcium: 7 mg/dL — ABNORMAL LOW (ref 8.9–10.3)
Chloride: 88 mmol/L — ABNORMAL LOW (ref 98–111)
Creatinine, Ser: 7.81 mg/dL — ABNORMAL HIGH (ref 0.61–1.24)
GFR, Estimated: 7 mL/min — ABNORMAL LOW (ref >60)
Glucose, Bld: 86 mg/dL (ref 70–99)
Potassium: 5 mmol/L (ref 3.5–5.1)
Sodium: 130 mmol/L — ABNORMAL LOW (ref 135–145)

## 2022-10-24 LAB — CBC WITH DIFFERENTIAL/PLATELET
Abs Immature Granulocytes: 0.06 10*3/uL (ref 0.00–0.07)
Basophils Absolute: 0 10*3/uL (ref 0.0–0.1)
Basophils Relative: 0 %
Eosinophils Absolute: 0.1 10*3/uL (ref 0.0–0.5)
Eosinophils Relative: 1 %
HCT: 23.2 % — ABNORMAL LOW (ref 39.0–52.0)
Hemoglobin: 7.9 g/dL — ABNORMAL LOW (ref 13.0–17.0)
Immature Granulocytes: 1 %
Lymphocytes Relative: 22 %
Lymphs Abs: 1.4 10*3/uL (ref 0.7–4.0)
MCH: 30.9 pg (ref 26.0–34.0)
MCHC: 34.1 g/dL (ref 30.0–36.0)
MCV: 90.6 fL (ref 80.0–100.0)
Monocytes Absolute: 1.1 10*3/uL — ABNORMAL HIGH (ref 0.1–1.0)
Monocytes Relative: 17 %
Neutro Abs: 4 10*3/uL (ref 1.7–7.7)
Neutrophils Relative %: 59 %
Platelets: 72 10*3/uL — ABNORMAL LOW (ref 150–400)
RBC: 2.56 MIL/uL — ABNORMAL LOW (ref 4.22–5.81)
RDW: 14.7 % (ref 11.5–15.5)
WBC: 6.7 10*3/uL (ref 4.0–10.5)
nRBC: 0.3 % — ABNORMAL HIGH (ref 0.0–0.2)

## 2022-10-24 LAB — CK: Total CK: 909 U/L — ABNORMAL HIGH (ref 49–397)

## 2022-10-24 NOTE — TOC Progression Note (Addendum)
Transition of Care [facility]) - Progression Note    Patient Details  Name: Austin Marsh MRN: [id number] Date of Birth: 06-03-1955  Transition of Care [facility]) CM/SW Contact  Liliana Cline, LCSW Phone Number: 10/24/2022, 9:19 AM  Clinical Narrative:    Attempted call to [facility] at patient's group home, no answer and VM still full. Asked [facility] SW if she can ask the patient's wife for the name of the patient's sister. Per [employer] SW "She said that his half sister is named [name] and he has 2 step sisters named [name] and [name]. she didn't know any last names." Asked RN to get any contact information for relatives if patient has visitors today.  2:35- Checked with NP, no urgent decisions that need to be made today. Still awaiting call from [place] with the group home.  Update left for [facility] Supervisor and weekday CSW.         Expected Discharge Plan and Services                                               Social Determinants of Health (SDOH) Interventions SDOH Screenings   Food Insecurity: No Food Insecurity (10/17/2022)  Housing: Patient Unable To Answer (10/17/2022)  Tobacco Use: High Risk (10/16/2022)    Readmission Risk Interventions    03/17/2020    1:48 PM  Readmission Risk Prevention Plan  Transportation Screening Complete  Palliative Care Screening Not Applicable  Medication Review (RN Care Manager) Complete

## 2022-10-24 NOTE — Progress Notes (Signed)
[facility]  ROUNDING NOTE   Subjective:   Patient underwent first dialysis treatment yesterday. Tolerated well.   Objective:  Vital signs in last 24 hours:  Temp:  [98.2 F (36.8 C)-98.8 F (37.1 C)] 98.4 F (36.9 C) (07/07 0400) Pulse Rate:  [80-97] 81 (07/07 0800) Resp:  [14-31] 20 (07/07 0800) BP: (105-158)/(57-76) 147/64 (07/07 0800) SpO2:  [94 %-100 %] 98 % (07/07 0800) Weight:  [101.4 kg] 101.4 kg (07/07 0500)  Weight change:  Filed Weights   10/23/22 1857 10/23/22 2135 10/24/22 0500  Weight: 101.4 kg 101.4 kg 101.4 kg    Intake/Output: I/O last 3 completed shifts: In: 1015.7 [I.V.:1015.7] Out: 277 [Urine:275; Other:2]   Intake/Output this shift:  No intake/output data recorded.  Physical Exam: General: Ill appearing  Head: Normocephalic, atraumatic. Dry oral mucosal membranes  Eyes: Anicteric  Lungs:  Faint wheeze present, [place]  Heart: Regular rate and rhythm  Abdomen:  Soft, nontender  Extremities: 2+ generalized edema.  Neurologic: Difficult to arouse  Skin: Weeping edema  Access: Right femoral temporary dialysis catheter  Foley in place  Basic Metabolic Panel: Recent Labs  Lab 10/18/22 1135 10/18/22 2122 10/19/22 0210 10/19/22 1426 10/20/22 0145 10/21/22 0405 10/22/22 0409 10/23/22 0521 10/24/22 0615  NA  --    < > 127*   < > 127* 124* 127* 125* 130*  K  --    < > 4.4   < > 4.3 4.5 5.0 5.3* 5.0  CL  --    < > 98   < > 94* 87* 86* 83* 88*  CO2  --    < > 17*   < > 23 24 28 27 29   GLUCOSE  --    < > 117*   < > 95 92 103* 78 86  BUN  --    < > 33*   < > 43* 56* 73* 91* 80*  CREATININE  --    < > 4.22*   < > 5.65* 6.98* 7.66* 9.04* 7.81*  CALCIUM  --    < > 7.0*   < > 7.1* 6.9* 7.3* 7.1* 7.0*  MG 1.9  --  1.8  --   --   --   --   --   --   PHOS 5.4*  --  5.8*  --   --   --   --   --   --    < > = values in this interval not displayed.     Liver Function Tests: Recent Labs  Lab 10/18/22 0655 10/19/22 0210 10/20/22 0145  10/21/22 0405  AST 770* 606* 480* 278*  ALT 134* 124* 120* 95*  ALKPHOS 22* 25* 31* 39  BILITOT 0.6 1.0 0.8 1.0  PROT 4.0* 4.3* 4.6* 4.4*  ALBUMIN 2.3* 2.5* 2.4* 1.9*    No results for input(s): "LIPASE", "AMYLASE" in the last 168 hours.  No results for input(s): "AMMONIA" in the last 168 hours.  CBC: Recent Labs  Lab 10/18/22 1135 10/18/22 2122 10/19/22 0210 10/20/22 0145 10/20/22 0755 10/21/22 0405 10/21/22 0800 10/21/22 1351 10/22/22 0409 10/23/22 0521 10/24/22 0615  WBC 10.6* 10.3   < > 11.3*  --  10.8*  --   --  10.2 7.4 6.7  NEUTROABS 8.1* 7.4  --   --   --   --   --   --  8.3* 4.7 4.0  HGB 5.4* 10.1*   < > 8.8*   < > 8.6* 8.6* 8.7* 9.0*  7.5* 7.9*  HCT 16.3* 28.4*   < > 25.1*   < > 24.3* 24.1* 24.6* 25.7* 21.3* 23.2*  MCV 97.6 87.7   < > 87.2  --  87.4  --   --  87.7 87.7 90.6  PLT 92* 83*   < > 81*  --  69*  --   --  71* 70* 72*   < > = values in this interval not displayed.     Cardiac Enzymes: Recent Labs  Lab 10/20/22 0145 10/21/22 0405 10/22/22 0409 10/23/22 0521 10/24/22 0615  CKTOTAL 12,021* 5,100* 2,838* 1,354* 909*     BNP: Invalid input(s): "POCBNP"  CBG: Recent Labs  Lab 10/17/22 1737 10/18/22 0836  GLUCAP 121* 172*     Microbiology: Results for orders placed or performed during the hospital encounter of 10/16/22  Blood Culture (routine x 2)     Status: None (Preliminary result)   Collection Time: 10/16/22 11:44 PM   Specimen: BLOOD LEFT ARM  Result Value Ref Range Status   Specimen Description   Final    BLOOD LEFT ARM Performed at [facility], [address] [place], [place] [address]    Special Requests   Final    BOTTLES DRAWN AEROBIC AND ANAEROBIC Blood Culture adequate volume Performed at [facility], [address], [place], [place] [address]    Culture  Setup Time   Final    GRAM POSTIVE ORGANISM AEROBIC BOTTLE ONLY CRITICAL VALUE NOTED.  VALUE IS CONSISTENT WITH PREVIOUSLY REPORTED AND  CALLED VALUE.    Culture   Final    GRAM POSITIVE RODS SENT TO [facility] FOR ID Performed at [facility], [address] [place], [place] [address]    Report Status PENDING  Incomplete  Blood Culture (routine x 2)     Status: Abnormal (Preliminary result)   Collection Time: 10/16/22 11:44 PM   Specimen: BLOOD RIGHT ARM  Result Value Ref Range Status   Specimen Description   Final    BLOOD RIGHT ARM Performed at [facility], [address], [place], [place] [address]    Special Requests   Final    BOTTLES DRAWN AEROBIC AND ANAEROBIC Blood Culture adequate volume Performed at [facility], [address], [place], [place] [address]    Culture  Setup Time   Final    GRAM POSITIVE COCCI ANAEROBIC BOTTLE ONLY CRITICAL RESULT CALLED TO, READ BACK BY AND VERIFIED WITH: NATHAN BELUE @ 2253 10/17/22 [facility] GRAM POSITIVE RODS AEROBIC BOTTLE ONLY CRITICAL RESULT CALLED TO, READ BACK BY AND VERIFIED WITH: JASON ROBBINS@0310  10/19/22 [facility]    Culture (A)  Final    STAPHYLOCOCCUS EPIDERMIDIS THE SIGNIFICANCE OF ISOLATING THIS ORGANISM FROM A SINGLE SET OF BLOOD CULTURES WHEN MULTIPLE SETS ARE DRAWN IS UNCERTAIN. PLEASE NOTIFY THE MICROBIOLOGY DEPARTMENT WITHIN ONE WEEK IF SPECIATION AND SENSITIVITIES ARE REQUIRED. GRAM POSITIVE RODS SENT TO [facility] FOR ID AND SUSCEPTIBILITIES Performed at  [facility], [address], [place], [place] [address]    Report Status PENDING  Incomplete  SARS Coronavirus 2 by RT PCR (hospital order, performed in [facility] hospital lab) *cepheid single result test* Anterior Nasal Swab     Status: None   Collection Time: 10/16/22 11:44 PM   Specimen: Anterior Nasal Swab  Result Value Ref Range Status   SARS Coronavirus 2 by RT PCR NEGATIVE NEGATIVE Final    Comment: (NOTE) SARS-CoV-2 target nucleic acids are NOT DETECTED.  The SARS-CoV-2 RNA  is generally detectable in upper and lower respiratory specimens during the acute phase of  infection. The lowest concentration of SARS-CoV-2 viral copies this assay can detect is 250 copies / mL. A negative result does not preclude SARS-CoV-2 infection and should not be used as the sole basis for treatment or other patient management decisions.  A negative result may occur with improper specimen collection / handling, submission of specimen other than nasopharyngeal swab, presence of viral mutation(s) within the areas targeted by this assay, and inadequate number of viral copies (<250 copies / mL). A negative result must be combined with clinical observations, patient history, and epidemiological information.  Fact Sheet for Patients:   RoadLapTop.co.za  Fact Sheet for Healthcare Providers: http://kim-miller.com/  This test is not yet approved or  cleared by the [place] FDA and has been authorized for detection and/or diagnosis of SARS-CoV-2 by FDA under an Emergency Use Authorization (EUA).  This EUA will remain in effect (meaning this test can be used) for the duration of the COVID-19 declaration under Section 564(b)(1) of the Act, 21 U.S.C. section 360bbb-3(b)(1), unless the authorization is terminated or revoked sooner.  Performed at [facility], [address] [place], [place] [address]   Blood Culture ID Panel (Reflexed)     Status: Abnormal   Collection Time: 10/16/22 11:44 PM  Result Value Ref Range Status   Enterococcus faecalis NOT DETECTED NOT DETECTED Final   Enterococcus Faecium NOT DETECTED NOT DETECTED Final   Listeria monocytogenes NOT DETECTED NOT DETECTED Final   Staphylococcus species DETECTED (A) NOT DETECTED Final    Comment: CRITICAL RESULT CALLED TO, READ BACK BY AND VERIFIED WITH: NATHAN BELUE @ 2253 10/17/22 [facility]    Staphylococcus aureus (BCID) NOT DETECTED NOT DETECTED Final   Staphylococcus epidermidis DETECTED (A) NOT DETECTED Final    Comment: Methicillin (oxacillin) resistant  coagulase negative staphylococcus. Possible blood culture contaminant (unless isolated from more than one blood culture draw or clinical case suggests pathogenicity). No antibiotic treatment is indicated for blood  culture contaminants. CRITICAL RESULT CALLED TO, READ BACK BY AND VERIFIED WITH: NATHAN BELUE @ 2253 10/17/22 [facility]    Staphylococcus lugdunensis NOT DETECTED NOT DETECTED Final   Streptococcus species NOT DETECTED NOT DETECTED Final   Streptococcus agalactiae NOT DETECTED NOT DETECTED Final   Streptococcus pneumoniae NOT DETECTED NOT DETECTED Final   Streptococcus pyogenes NOT DETECTED NOT DETECTED Final   A.calcoaceticus-baumannii NOT DETECTED NOT DETECTED Final   Bacteroides fragilis NOT DETECTED NOT DETECTED Final   Enterobacterales NOT DETECTED NOT DETECTED Final   Enterobacter cloacae complex NOT DETECTED NOT DETECTED Final   Escherichia coli NOT DETECTED NOT DETECTED Final   Klebsiella aerogenes NOT DETECTED NOT DETECTED Final   Klebsiella oxytoca NOT DETECTED NOT DETECTED Final   Klebsiella pneumoniae NOT DETECTED NOT DETECTED Final   Proteus species NOT DETECTED NOT DETECTED Final   Salmonella species NOT DETECTED NOT DETECTED Final   Serratia marcescens NOT DETECTED NOT DETECTED Final   Haemophilus influenzae NOT DETECTED NOT DETECTED Final   Neisseria meningitidis NOT DETECTED NOT DETECTED Final   Pseudomonas aeruginosa NOT DETECTED NOT DETECTED Final   Stenotrophomonas maltophilia NOT DETECTED NOT DETECTED Final   Candida albicans NOT DETECTED NOT DETECTED Final   Candida auris NOT DETECTED NOT DETECTED Final   Candida glabrata NOT DETECTED NOT DETECTED Final   Candida krusei NOT DETECTED NOT DETECTED Final   Candida parapsilosis NOT DETECTED NOT DETECTED Final   Candida tropicalis NOT DETECTED NOT DETECTED Final  Cryptococcus neoformans/gattii NOT DETECTED NOT DETECTED Final   Methicillin resistance mecA/C DETECTED (A) NOT DETECTED Final    Comment: CRITICAL  RESULT CALLED TO, READ BACK BY AND VERIFIED WITH: NATHAN BELUE @ 2253 10/17/22 [facility] Performed at [facility], [address] [place], [place] [address]   Respiratory (~20 pathogens) panel by PCR     Status: None   Collection Time: 10/17/22  9:26 AM   Specimen: Nasopharyngeal Swab; Respiratory  Result Value Ref Range Status   Adenovirus NOT DETECTED NOT DETECTED Final   Coronavirus 229E NOT DETECTED NOT DETECTED Final    Comment: (NOTE) The Coronavirus on the Respiratory Panel, DOES NOT test for the novel  Coronavirus (2019 nCoV)    Coronavirus HKU1 NOT DETECTED NOT DETECTED Final   Coronavirus NL63 NOT DETECTED NOT DETECTED Final   Coronavirus OC43 NOT DETECTED NOT DETECTED Final   Metapneumovirus NOT DETECTED NOT DETECTED Final   Rhinovirus / Enterovirus NOT DETECTED NOT DETECTED Final   Influenza A NOT DETECTED NOT DETECTED Final   Influenza B NOT DETECTED NOT DETECTED Final   Parainfluenza Virus 1 NOT DETECTED NOT DETECTED Final   Parainfluenza Virus 2 NOT DETECTED NOT DETECTED Final   Parainfluenza Virus 3 NOT DETECTED NOT DETECTED Final   Parainfluenza Virus 4 NOT DETECTED NOT DETECTED Final   Respiratory Syncytial Virus NOT DETECTED NOT DETECTED Final   Bordetella pertussis NOT DETECTED NOT DETECTED Final   Bordetella Parapertussis NOT DETECTED NOT DETECTED Final   Chlamydophila pneumoniae NOT DETECTED NOT DETECTED Final   Mycoplasma pneumoniae NOT DETECTED NOT DETECTED Final    Comment: Performed at [facility], [address], [place], [place] [address]  MRSA Next Gen by PCR, Nasal     Status: None   Collection Time: 10/18/22 10:53 AM   Specimen: Nasal Mucosa; Nasal Swab  Result Value Ref Range Status   MRSA by PCR Next Gen NOT DETECTED NOT DETECTED Final    Comment: (NOTE) The GeneXpert MRSA Assay (FDA approved for NASAL specimens only), is one component of a comprehensive MRSA colonization surveillance program. It is not intended to diagnose MRSA  infection nor to guide or monitor treatment for MRSA infections. Test performance is not FDA approved in patients less than 45 years old. Performed at [facility], [address] [place], [place] [address]     Coagulation Studies: No results for input(s): "LABPROT", "INR" in the last 72 hours.   Urinalysis: No results for input(s): "COLORURINE", "LABSPEC", "PHURINE", "GLUCOSEU", "HGBUR", "BILIRUBINUR", "KETONESUR", "PROTEINUR", "UROBILINOGEN", "NITRITE", "LEUKOCYTESUR" in the last 72 hours.  Invalid input(s): "APPERANCEUR"    Imaging: No results found.   Medications:    anticoagulant sodium citrate     sodium bicarbonate 150 mEq in sterile water 1,150 mL infusion 35 mL/hr at 10/24/22 0600    benztropine mesylate  2 mg Intramuscular BID   Or   benztropine  2 mg Oral BID   busPIRone  15 mg Oral BID   Chlorhexidine Gluconate Cloth  6 each Topical Daily   cholecalciferol  2,000 Units Oral Daily   cloZAPine  100 mg Oral Daily   And   cloZAPine  200 mg Oral QHS   finasteride  5 mg Oral Daily   glycopyrrolate  1 mg Oral Daily   levothyroxine  75 mcg Oral Q0600   lidocaine  10 mL Intradermal Once   nicotine  21 mg Transdermal Daily   mouth rinse  15 mL Mouth Rinse 4 times per day  pantoprazole  40 mg Oral Daily   senna-docusate  1 tablet Oral BID   traZODone  50 mg Oral QHS   valbenazine  80 mg Oral Daily   vitamin B-12  100 mcg Oral Daily   acetaminophen **OR** acetaminophen, albuterol, alteplase, alum & mag hydroxide-simeth, anticoagulant sodium citrate, bismuth subsalicylate, guaiFENesin, heparin, lidocaine (PF), lidocaine-prilocaine, loperamide, LORazepam, magnesium hydroxide, magnesium hydroxide, meclizine, methocarbamol, morphine injection, ondansetron **OR** ondansetron (ZOFRAN) IV, mouth rinse, oxyCODONE-acetaminophen, pentafluoroprop-tetrafluoroeth, polyethylene glycol, sodium chloride  Assessment/ Plan:  Mr. Jared Sneden is a 67 y.o.  male with past  medical history of hypothyroidism, depression with anxiety, schizoaffective, and COPD, who was admitted to [facility] on 10/16/2022 for Lactic acidosis [E87.20] Elevated troponin level [R79.89] Demand ischemia [I24.89] NSTEMI (non-ST elevated myocardial infarction) (HCC) [I21.4] Acute kidney injury (HCC) [N17.9] Septic shock (HCC) [A41.9, R65.21] Sepsis due to undetermined organism (HCC) [A41.9] Altered mental status, unspecified altered mental status type [R41.82] SIRS (systemic inflammatory response syndrome) (HCC) [R65.10]   Acute kidney injury with hyponatremia likely secondary to rhabdomyolysis.    -Creatinine currently 7.81.  Rhabdomyolysis also improving.  We will schedule next [place] treatment again for tomorrow.   Lab Results  Component Value Date   CREATININE 7.81 (H) 10/24/2022   CREATININE 9.04 (H) 10/23/2022   CREATININE 7.66 (H) 10/22/2022    Intake/Output Summary (Last 24 hours) at 10/24/2022 0930 Last data filed at 10/24/2022 0600 Gross per 24 hour  Intake 526.15 ml  Output 277 ml  Net 249.15 ml    2. Anemia of acute blood loss:  Lab Results  Component Value Date   HGB 7.9 (L) 10/24/2022  Continue to monitor CBC.  No immediate need for blood transfusion.  3. Acute respiratory failure requiring HFNC, right lower lobe pneumonia noted with positive blood culture, bacteremia.  Remains on supplemental oxygen.      LOS: 7 Austin Marsh 7/7/20249:30 AM

## 2022-10-24 NOTE — Progress Notes (Signed)
Speech Language Pathology Treatment: Dysphagia  Patient Details Name: Austin Marsh MRN: [id number] DOB: 1955-11-16 Today's Date: 10/24/2022 Time: 7846-9629 SLP Time Calculation (min) (ACUTE ONLY): 14 min  Assessment / Plan / Recommendation Clinical Impression  Pt seen for clinical swallowing re-evaluation. Pt awake initally. Notably confused with press of speech. Pt difficult to redirect. B mitts donned prior to SLP entrance to room and for duration of evaluation. On 2L/min O2 via [place]. Congested, wet cough appreciated at baseline. Cleared with RN who noted that she held oral meds this date, but observed pt to be more alert and generally more cooperative for participation in SLP services.   Pt offered trials of applesauce and ice chips. Pt declined applesauce trials despite education. Pt observed consuming x4 ice chips. Pt with s/sx oral and highly suspected pharyngeal dysphagia including oral holding, delayed A-P transit, seemingly delayed swallow initiation, absent swallow initiation on 2 of 4 ice chip trials, wet vocal quality, and delayed/congested cough. At present, a safe oral diet cannot be recommended. Of note, pt incessantly talking and unable to be redirected until pt began falling asleep. Pt's mental status continues to be a barrier to safe oral intake.   Recommend strict NPO with oral care provided by nursing staff and consideration for short term alternate route of nutrition/hydration/medication. SLP to continue to f/u for continued dysphagia management with additional PO trials pending improvement in mental status. RN and MD made aware of results, recommendations, and SLP POC.    HPI HPI: Pt is a 67 y.o. male with medical history significant of COPD, hypothyroidism, depression with anxiety, BPH, CKD-3A, schizoaffective disorder with bipolar, tardive dyskinesia, OSA on CPAP, who presents with fall and AMS.  Pt resides at a [facility].  Per care giver, at normal baseline, patient  is alert oriented x 3.  Upon admit to the ED, the pt was lethargic and confused. Pt admited w/ SIRS and rhabdomyolysis w/ hyponatremia.  He required BIPAP in ICU -- NSG reported concern he may have aspirated secretions/phelgm d/t his thick secretions/congestion.    CT Head: no acute abnormaility.  CT Chest: Debris in the right lower lobe bronchus (series  3/image 34), new from recent prior, with new right lower lobe  atelectasis/collapse. This appearance favors mucous plugging.      SLP Plan  Continue with current plan of care      Recommendations for follow up therapy are one component of a multi-disciplinary discharge planning process, led by the attending physician.  Recommendations may be updated based on patient status, additional functional criteria and insurance authorization.    Recommendations  Diet recommendations: NPO Medication Administration: Via alternative means                 (Dietician and Palliative Care on board) Oral care QID;Staff/trained caregiver to provide oral care   Frequent or constant Supervision/Assistance Dysphagia, oropharyngeal phase (R13.12)     Continue with current plan of care    Austin Marsh, M.S., CCC-SLP Speech-Language Pathologist [facility] [phone] Austin Marsh)  Austin Marsh  10/24/2022, 12:37 PM

## 2022-10-24 NOTE — Progress Notes (Signed)
Progress Note   Patient: Austin Marsh UJW:[id number] DOB: February 27, 1956 DOA: 10/16/2022     7 DOS: the patient was seen and examined on 10/24/2022      Subjective:  Patient seen and examined at bedside this morning Had dialysis catheter placement on 10/23/2022 Hemodialysis was initiated outpatient on 10/23/2022 Able to engage in some conversation today but still confused Continue to remain in mittens -Subjective information Limited on account of altered mental status   Brief hospital course: "Austin Marsh is a 67 y.o. male with medical history significant of COPD, hypothyroidism, depression with anxiety, BPH, CKD-3A, schizoaffective disorder with bipolar, tardive dyskinesia, OSA on CPAP, who presents with fall and AMS."  Caregiver at family care facility.  reported pt was in his normal health condition until 10:30 PM yesterday when pt had fall and was found on the floor. At normal baseline, patient is alert oriented x 3 but today is confused.  On admission, pt was lethargic, oriented to person and place, not to year or situation.  He reported right hip pain with moving right leg, low back pain, some chest pain which he was unable to characterize further, constipation, suprapubic pain.  He was found to have acute urinary retention (973 cc on bladder scan).  Foley catheter was placed.     In the ED - fever of 101, HR 123, RR 26, stable O2 sat on room air.  Per EMS report, patient had low BP, but at time of admission BP was stable at 124/73.   Chest x-ray negative.   CT of head negative for acute intracranial abnormalities.   CT Chest/Abd/Pelvis: Moderate coronary artery calcification. No acute intrathoracic or intra-abdominal pathology identified. Stable 9 mm right adrenal adenoma. Stable changes of adrenal hyperplasia. Marked distention of the bladder possibly reflecting changes of bladder outlet obstruction. Mild prostatic hypertrophy.  Probable post TURP changes.  X-ray of bilateral hips/pelvis  negative for fracture.   X-ray of L-spine and T-spine negative for acute fracture, did showed degenerative disc disease    Notable labs - WBC 11.2, severe AKI with Cr 7.78, sodium 123, negative COVID PCR, lactic acid> 9.0 ---> 4.9, troponin level 172, 406, 804, 784.  Sodium 123, magnesium 2.3,  Hemoglobin 11.5.   Pt was admitted to PCU. Subsequently found to have severe rhabdomyolysis with CK peaking above 50k. IV heparin was started and cardiology consulted due to troponin elevation.   7/1 -- pt hypotensive, requiring IV fluid boluses.  Worsening encephalopathy, Hbg dropped to 6.3, large area of ecchymosis and swelling in the left neck & upper shoulder concerning for hematoma, later confirmed on CT check and neck soft tissue.  Pt was transferred to stepdown for developing shock. PCCM consulted.  Transfused 2 units pRBC's, 1 FFP.   7/2 -- BP's improved after 3 units pRBC's, 1 FFP total.  Hbg improved from nadir 5.4 to 10.1 >> 9.3 this AM.  +Blood cultures with GPC's and GPR's.  ID consulted.  CK trending down. Nephrology consulted with worsening renal function and poor urine output reported.   Assessment and Plan:     Acute kidney injury (HCC)-worsening Stage 3a CKD: Baseline Cr 1.17 on 06/04/2022.  Cr on admission 7.78 AKI likely due to severe rhabdomyolysis, sepsis, dehydration, urinary retention secondary to bladder outlet obstruction. Case discussed with nephrology team Avoid nephrotoxic medications Continue bicarb drip Had dialysis catheter placement on 10/23/2022 Hemodialysis was initiated outpatient on 10/23/2022 Able to engage in some conversation today but still confused We will continue to optimize his  renal function Follow-up on renal function test    * Septic shock (HCC)-improved On admission, tachycardic, tachypneic, febrile. Lactic acid was above 9 meeting septic shock criteria (with dehydration, AKI, rhabdo contributing).  Hypotension seemed more due to acute blood loss 2/2  left neck hematoma.  BP responded to fluids, blood products and midodrine, did not require pressors. Has bacteremia and RLL pneumonia suspect acute aspiration on 7/1 while encephalopathic  Patient completed a course of Zyvox and currently antibiotics have been discontinued by infectious disease specialist we appreciate input Continue to monitor closely Infectious disease on board   Acute respiratory failure with hypoxia (HCC) Right lower lobe pneumonia, not POA, suspect acute aspiration event on 7/1. Antibiotics completed and discontinued by ID on 10/22/2022 --Wean O2 as tolerated, maintain spO2 >90% Patient requiring 2 L of intranasal oxygen, we will wean off as tolerated   Bacteremia due to Staphylococcus Initial blood cx +staph epidermidis felt contaminant.  Antibiotics were completed and discontinued by ID on 10/22/2022 Infectious disease on board we appreciate input   Hematoma-resolved Hematoma Left Neck/Shoulder  Acute Blood Loss Anemia Hypotension due to above and ?septic shock 7/1 - Hbg dropped from 11.5 >> 6.3 and pt noted to have new swelling and ecchymosis at the left neck & upper shoulder.  --Heparin was stopped  --Transfused total 3 units pRBC's and 1 FFP --Transferred to stepdown 7/1 --PCCM consulted for pressors if needed - signed off, BP's stabilized Monitor CBC closely   Rhabdomyolysis-improved CK peaked > 50000 Given aggressive IV hydration for this and hypotension, then devoped hypoxia on 7/1, concern for pulmonary edema but found to have RLL collapse.  Fluid held as patient requiring dialysis now CPK level downtrending   Acute metabolic encephalopathy CT head on admission negative.  Multifactorial due to metabolic derangements, sepsis / bacteremia, underlying psych conditions Resume home psychiatric meds once safe to take PO Continue delirium precaution -As needed Ativan for severe agitation otherwise we will avoid benzodiazepine use     Acute retention of  urine Patient has underlying BPH Foley placed on admission. Blood pressure improved however patient unable to take his oral Cardura Resume Proscar when able to take oral meds Outpatient urology follow-up For voiding trial when patient is improved   NSTEMI (non-ST elevated myocardial infarction) (HCC) Elevated troponin - demand ischemia & delayed clearance with severe AKI. -Cardiology was consulted, signed off --IV heparin stopped 7/1 AM with acute anemia and hematoma Continue to monitor closely   Fall Unknown cause, pt was found on the floor at his family care home, per caregiver's history on admission. --PT OT evaluation when clinically improved --Imaging was negative for acute injuries / fractures --Neck hematoma developed 7/1 Confirmed on CT chest and neck soft tissue obtained later on 7/1 PM   COPD (chronic obstructive pulmonary disease) (HCC) Stable -Continue PRN bronchodilators and Mucinex   Hyponatremia Sodium 124 on 10/21/2022 Saline fluids stopped due to hypoxia on 7/1 Monitor sodium level closely Nephrologist following   Hypothyroidism Continue Synthroid     Schizoaffective disorder, bipolar type (HCC) Resume home PO meds when able Allergy listed to haldol, will use IV Ativan PRN if agitation   Depression with anxiety -Resume home Benztropine, BuSpar, clozapine, Depakote, Ingrezza when able to take PO meds   Tobacco abuse Nicotine patch ordered       Disposition: Patient still meets criteria for inpatient management as he continues to require hemodialysis    Planned Discharge Destination: Home     Physical examination:  General exam: Laying in bed in mittens confused  HEENT: Ecchymosis and swelling noted at the left neck appears improved  Respiratory system: CTAB, diminished bases Cardiovascular system: normal S1/S2, RRR, trace pedal edema.   Gastrointestinal system: soft, NT, ND Central nervous system: very limited exam due to encephalopathy and  incoherent speech, follows commands Extremities: Bilateral upper extremity mittens Skin: dry, intact, normal temperature, pale appearing Psychiatry: Still remains encephalopathic       Data Reviewed: I reviewed patient's lab, vitals, nursing documentation as well as nephrology documentation. I also discussed the plan of care with speech therapist and at this point since patient is not safe to take oral medication they will make patient n.p.o. for now. We will consider other means of oral feeding plus medication.   Family Communication:  Spoke with case management who also spoke with patient's group home and apparently patient's may have a sister. We are trying to get in touch with patient's sister to help with decision making      Vitals:   10/24/22 1100 10/24/22 1200 10/24/22 1400 10/24/22 1500  BP: (!) 154/66 (!) 149/78  (!) 154/73  Pulse: 84 87  85  Resp: 17 (!) 23  20  Temp:   97.9 F (36.6 C)   TempSrc:   Axillary   SpO2: 96% 96%  97%  Weight:      Height:          Author: Loyce Dys, MD 10/24/2022 6:15 PM  For on call review [email or web address].

## 2022-10-25 DIAGNOSIS — R6521 Severe sepsis with septic shock: Secondary | ICD-10-CM | POA: Diagnosis not present

## 2022-10-25 DIAGNOSIS — A419 Sepsis, unspecified organism: Secondary | ICD-10-CM | POA: Diagnosis not present

## 2022-10-25 LAB — BASIC METABOLIC PANEL
Anion gap: 16 — ABNORMAL HIGH (ref 5–15)
BUN: 100 mg/dL — ABNORMAL HIGH (ref 8–23)
CO2: 28 mmol/L (ref 22–32)
Calcium: 7.2 mg/dL — ABNORMAL LOW (ref 8.9–10.3)
Chloride: 86 mmol/L — ABNORMAL LOW (ref 98–111)
Creatinine, Ser: 9.13 mg/dL — ABNORMAL HIGH (ref 0.61–1.24)
GFR, Estimated: 6 mL/min — ABNORMAL LOW (ref >60)
Glucose, Bld: 77 mg/dL (ref 70–99)
Potassium: 5.1 mmol/L (ref 3.5–5.1)
Sodium: 130 mmol/L — ABNORMAL LOW (ref 135–145)

## 2022-10-25 LAB — MISC LABCORP TEST (SEND OUT)
LabCorp test name: 182261
LabCorp test name: 8664
Labcorp test code: 182261
Labcorp test code: 8664

## 2022-10-25 LAB — CK: Total CK: 830 U/L — ABNORMAL HIGH (ref 49–397)

## 2022-10-25 MED ORDER — PANTOPRAZOLE SODIUM 40 MG IV SOLR
40.0000 mg | INTRAVENOUS | Status: DC
  Administered 2022-10-25 – 2022-10-28 (×4): 40 mg via INTRAVENOUS
  Filled 2022-10-25 (×4): qty 10

## 2022-10-25 MED ORDER — LACTULOSE ENEMA
300.0000 mL | Freq: Every day | ORAL | Status: DC
  Administered 2022-10-25: 300 mL via RECTAL
  Filled 2022-10-25 (×3): qty 300

## 2022-10-25 NOTE — Progress Notes (Signed)
[facility]  ROUNDING NOTE   Subjective:   Urine output was 225 cc over the preceding 24 hours. Due for second dialysis treatment today. A bit more awake and alert today.   Objective:  Vital signs in last 24 hours:  Temp:  [97.8 F (36.6 C)-98.2 F (36.8 C)] 97.8 F (36.6 C) (07/08 0800) Pulse Rate:  [73-90] 88 (07/08 0800) Resp:  [13-38] 18 (07/08 0800) BP: (131-170)/(63-83) 141/74 (07/08 0800) SpO2:  [95 %-100 %] 100 % (07/08 0800) Weight:  [86.4 kg] 86.4 kg (07/08 0444)  Weight change: -15 kg Filed Weights   10/23/22 2135 10/24/22 0500 10/25/22 0444  Weight: 101.4 kg 101.4 kg 86.4 kg    Intake/Output: I/O last 3 completed shifts: In: 1218 [I.V.:1218] Out: 477 [Urine:475; Other:2]   Intake/Output this shift:  Total I/O In: 69.9 [I.V.:69.9] Out: -   Physical Exam: General: No acute distress  Head: Normocephalic, atraumatic. Dry oral mucosal membranes  Eyes: Anicteric  Lungs:  Scattered rhonchi, normal effort  Heart: Regular rate and rhythm  Abdomen:  Soft, nontender  Extremities: 2+ generalized edema.  Neurologic: Awake, alert  Skin: Weeping edema  Access: Right femoral temporary dialysis catheter  Foley in place  Basic Metabolic Panel: Recent Labs  Lab 10/19/22 0210 10/19/22 1426 10/21/22 0405 10/22/22 0409 10/23/22 0521 10/24/22 0615 10/25/22 0546  NA 127*   < > 124* 127* 125* 130* 130*  K 4.4   < > 4.5 5.0 5.3* 5.0 5.1  CL 98   < > 87* 86* 83* 88* 86*  CO2 17*   < > 24 28 27 29 28   GLUCOSE 117*   < > 92 103* 78 86 77  BUN 33*   < > 56* 73* 91* 80* 100*  CREATININE 4.22*   < > 6.98* 7.66* 9.04* 7.81* 9.13*  CALCIUM 7.0*   < > 6.9* 7.3* 7.1* 7.0* 7.2*  MG 1.8  --   --   --   --   --   --   PHOS 5.8*  --   --   --   --   --   --    < > = values in this interval not displayed.     Liver Function Tests: Recent Labs  Lab 10/19/22 0210 10/20/22 0145 10/21/22 0405  AST 606* 480* 278*  ALT 124* 120* 95*  ALKPHOS 25* 31* 39   BILITOT 1.0 0.8 1.0  PROT 4.3* 4.6* 4.4*  ALBUMIN 2.5* 2.4* 1.9*    No results for input(s): "LIPASE", "AMYLASE" in the last 168 hours.  No results for input(s): "AMMONIA" in the last 168 hours.  CBC: Recent Labs  Lab 10/18/22 2122 10/19/22 0210 10/20/22 0145 10/20/22 0755 10/21/22 0405 10/21/22 0800 10/21/22 1351 10/22/22 0409 10/23/22 0521 10/24/22 0615  WBC 10.3   < > 11.3*  --  10.8*  --   --  10.2 7.4 6.7  NEUTROABS 7.4  --   --   --   --   --   --  8.3* 4.7 4.0  HGB 10.1*   < > 8.8*   < > 8.6* 8.6* 8.7* 9.0* 7.5* 7.9*  HCT 28.4*   < > 25.1*   < > 24.3* 24.1* 24.6* 25.7* 21.3* 23.2*  MCV 87.7   < > 87.2  --  87.4  --   --  87.7 87.7 90.6  PLT 83*   < > 81*  --  69*  --   --  71* 70*  72*   < > = values in this interval not displayed.     Cardiac Enzymes: Recent Labs  Lab 10/21/22 0405 10/22/22 0409 10/23/22 0521 10/24/22 0615 10/25/22 0546  CKTOTAL 5,100* 2,838* 1,354* 909* 830*     BNP: Invalid input(s): "POCBNP"  CBG: No results for input(s): "GLUCAP" in the last 168 hours.   Microbiology: Results for orders placed or performed during the hospital encounter of 10/16/22  Blood Culture (routine x 2)     Status: None (Preliminary result)   Collection Time: 10/16/22 11:44 PM   Specimen: BLOOD LEFT ARM  Result Value Ref Range Status   Specimen Description   Final    BLOOD LEFT ARM Performed at [facility], [address] [place], [place] [address]    Special Requests   Final    BOTTLES DRAWN AEROBIC AND ANAEROBIC Blood Culture adequate volume Performed at [facility], [address] [place], [place] [address]    Culture  Setup Time   Final    GRAM POSTIVE ORGANISM AEROBIC BOTTLE ONLY CRITICAL VALUE NOTED.  VALUE IS CONSISTENT WITH PREVIOUSLY REPORTED AND CALLED VALUE.    Culture   Final    GRAM POSITIVE RODS SENT TO [facility] FOR ID Performed at [facility], [address] [place], [place] [address]    Report  Status PENDING  Incomplete  Blood Culture (routine x 2)     Status: Abnormal (Preliminary result)   Collection Time: 10/16/22 11:44 PM   Specimen: BLOOD RIGHT ARM  Result Value Ref Range Status   Specimen Description   Final    BLOOD RIGHT ARM Performed at [facility], [address] [place], [place] [address]    Special Requests   Final    BOTTLES DRAWN AEROBIC AND ANAEROBIC Blood Culture adequate volume Performed at [facility], [address] [place], [place] [address]    Culture  Setup Time   Final    GRAM POSITIVE COCCI ANAEROBIC BOTTLE ONLY CRITICAL RESULT CALLED TO, READ BACK BY AND VERIFIED WITH: NATHAN BELUE @ 2253 10/17/22 [facility] GRAM POSITIVE RODS AEROBIC BOTTLE ONLY CRITICAL RESULT CALLED TO, READ BACK BY AND VERIFIED WITH: JASON ROBBINS@0310  10/19/22 [facility]    Culture (A)  Final    STAPHYLOCOCCUS EPIDERMIDIS THE SIGNIFICANCE OF ISOLATING THIS ORGANISM FROM A SINGLE SET OF BLOOD CULTURES WHEN MULTIPLE SETS ARE DRAWN IS UNCERTAIN. PLEASE NOTIFY THE MICROBIOLOGY DEPARTMENT WITHIN ONE WEEK IF SPECIATION AND SENSITIVITIES ARE REQUIRED. GRAM POSITIVE RODS SENT TO [facility] FOR ID AND SUSCEPTIBILITIES Performed at [facility], [address] [place], [place] [address]    Report Status PENDING  Incomplete  SARS Coronavirus 2 by RT PCR (hospital order, performed in [facility] hospital lab) *cepheid single result test* Anterior Nasal Swab     Status: None   Collection Time: 10/16/22 11:44 PM   Specimen: Anterior Nasal Swab  Result Value Ref Range Status   SARS Coronavirus 2 by RT PCR NEGATIVE NEGATIVE Final    Comment: (NOTE) SARS-CoV-2 target nucleic acids are NOT DETECTED.  The SARS-CoV-2 RNA is generally detectable in upper and lower respiratory specimens during the acute phase of infection. The lowest concentration of SARS-CoV-2 viral copies this assay can detect is 250 copies / mL. A negative result does not preclude SARS-CoV-2 infection and  should not be used as the sole basis for treatment or other patient management decisions.  A negative result may occur with improper specimen collection / handling,  submission of specimen other than nasopharyngeal swab, presence of viral mutation(s) within the areas targeted by this assay, and inadequate number of viral copies (<250 copies / mL). A negative result must be combined with clinical observations, patient history, and epidemiological information.  Fact Sheet for Patients:   RoadLapTop.co.za  Fact Sheet for Healthcare Providers: http://kim-miller.com/  This test is not yet approved or  cleared by the [place] FDA and has been authorized for detection and/or diagnosis of SARS-CoV-2 by FDA under an Emergency Use Authorization (EUA).  This EUA will remain in effect (meaning this test can be used) for the duration of the COVID-19 declaration under Section 564(b)(1) of the Act, 21 U.S.C. section 360bbb-3(b)(1), unless the authorization is terminated or revoked sooner.  Performed at [facility], [address] [place], [place] [address]   Blood Culture ID Panel (Reflexed)     Status: Abnormal   Collection Time: 10/16/22 11:44 PM  Result Value Ref Range Status   Enterococcus faecalis NOT DETECTED NOT DETECTED Final   Enterococcus Faecium NOT DETECTED NOT DETECTED Final   Listeria monocytogenes NOT DETECTED NOT DETECTED Final   Staphylococcus species DETECTED (A) NOT DETECTED Final    Comment: CRITICAL RESULT CALLED TO, READ BACK BY AND VERIFIED WITH: NATHAN BELUE @ 2253 10/17/22 [facility]    Staphylococcus aureus (BCID) NOT DETECTED NOT DETECTED Final   Staphylococcus epidermidis DETECTED (A) NOT DETECTED Final    Comment: Methicillin (oxacillin) resistant coagulase negative staphylococcus. Possible blood culture contaminant (unless isolated from more than one blood culture draw or clinical case suggests pathogenicity). No  antibiotic treatment is indicated for blood  culture contaminants. CRITICAL RESULT CALLED TO, READ BACK BY AND VERIFIED WITH: NATHAN BELUE @ 2253 10/17/22 [facility]    Staphylococcus lugdunensis NOT DETECTED NOT DETECTED Final   Streptococcus species NOT DETECTED NOT DETECTED Final   Streptococcus agalactiae NOT DETECTED NOT DETECTED Final   Streptococcus pneumoniae NOT DETECTED NOT DETECTED Final   Streptococcus pyogenes NOT DETECTED NOT DETECTED Final   A.calcoaceticus-baumannii NOT DETECTED NOT DETECTED Final   Bacteroides fragilis NOT DETECTED NOT DETECTED Final   Enterobacterales NOT DETECTED NOT DETECTED Final   Enterobacter cloacae complex NOT DETECTED NOT DETECTED Final   Escherichia coli NOT DETECTED NOT DETECTED Final   Klebsiella aerogenes NOT DETECTED NOT DETECTED Final   Klebsiella oxytoca NOT DETECTED NOT DETECTED Final   Klebsiella pneumoniae NOT DETECTED NOT DETECTED Final   Proteus species NOT DETECTED NOT DETECTED Final   Salmonella species NOT DETECTED NOT DETECTED Final   Serratia marcescens NOT DETECTED NOT DETECTED Final   Haemophilus influenzae NOT DETECTED NOT DETECTED Final   Neisseria meningitidis NOT DETECTED NOT DETECTED Final   Pseudomonas aeruginosa NOT DETECTED NOT DETECTED Final   Stenotrophomonas maltophilia NOT DETECTED NOT DETECTED Final   Candida albicans NOT DETECTED NOT DETECTED Final   Candida auris NOT DETECTED NOT DETECTED Final   Candida glabrata NOT DETECTED NOT DETECTED Final   Candida krusei NOT DETECTED NOT DETECTED Final   Candida parapsilosis NOT DETECTED NOT DETECTED Final   Candida tropicalis NOT DETECTED NOT DETECTED Final   Cryptococcus neoformans/gattii NOT DETECTED NOT DETECTED Final   Methicillin resistance mecA/C DETECTED (A) NOT DETECTED Final    Comment: CRITICAL RESULT CALLED TO, READ BACK BY AND VERIFIED WITHDawayne Cirri @ 2253 10/17/22 [facility] Performed at [facility], [address] [place], [place] [address]    Respiratory (~20 pathogens) panel by PCR     Status: None  Collection Time: 10/17/22  9:26 AM   Specimen: Nasopharyngeal Swab; Respiratory  Result Value Ref Range Status   Adenovirus NOT DETECTED NOT DETECTED Final   Coronavirus 229E NOT DETECTED NOT DETECTED Final    Comment: (NOTE) The Coronavirus on the Respiratory Panel, DOES NOT test for the novel  Coronavirus (2019 nCoV)    Coronavirus HKU1 NOT DETECTED NOT DETECTED Final   Coronavirus NL63 NOT DETECTED NOT DETECTED Final   Coronavirus OC43 NOT DETECTED NOT DETECTED Final   Metapneumovirus NOT DETECTED NOT DETECTED Final   Rhinovirus / Enterovirus NOT DETECTED NOT DETECTED Final   Influenza A NOT DETECTED NOT DETECTED Final   Influenza B NOT DETECTED NOT DETECTED Final   Parainfluenza Virus 1 NOT DETECTED NOT DETECTED Final   Parainfluenza Virus 2 NOT DETECTED NOT DETECTED Final   Parainfluenza Virus 3 NOT DETECTED NOT DETECTED Final   Parainfluenza Virus 4 NOT DETECTED NOT DETECTED Final   Respiratory Syncytial Virus NOT DETECTED NOT DETECTED Final   Bordetella pertussis NOT DETECTED NOT DETECTED Final   Bordetella Parapertussis NOT DETECTED NOT DETECTED Final   Chlamydophila pneumoniae NOT DETECTED NOT DETECTED Final   Mycoplasma pneumoniae NOT DETECTED NOT DETECTED Final    Comment: Performed at [facility], [address], [place], [place] [address]  MRSA Next Gen by PCR, Nasal     Status: None   Collection Time: 10/18/22 10:53 AM   Specimen: Nasal Mucosa; Nasal Swab  Result Value Ref Range Status   MRSA by PCR Next Gen NOT DETECTED NOT DETECTED Final    Comment: (NOTE) The GeneXpert MRSA Assay (FDA approved for NASAL specimens only), is one component of a comprehensive MRSA colonization surveillance program. It is not intended to diagnose MRSA infection nor to guide or monitor treatment for MRSA infections. Test performance is not FDA approved in patients less than 83 years old. Performed at [facility], [address] [place], [place] [address]     Coagulation Studies: No results for input(s): "LABPROT", "INR" in the last 72 hours.   Urinalysis: No results for input(s): "COLORURINE", "LABSPEC", "PHURINE", "GLUCOSEU", "HGBUR", "BILIRUBINUR", "KETONESUR", "PROTEINUR", "UROBILINOGEN", "NITRITE", "LEUKOCYTESUR" in the last 72 hours.  Invalid input(s): "APPERANCEUR"    Imaging: No results found.   Medications:    anticoagulant sodium citrate     sodium bicarbonate 150 mEq in sterile water 1,150 mL infusion 35 mL/hr at 10/25/22 0800    Chlorhexidine Gluconate Cloth  6 each Topical Daily   lidocaine  10 mL Intradermal Once   nicotine  21 mg Transdermal Daily   mouth rinse  15 mL Mouth Rinse 4 times per day   pantoprazole (PROTONIX) IV  40 mg Intravenous Q24H   acetaminophen **OR** acetaminophen, albuterol, alteplase, alum & mag hydroxide-simeth, anticoagulant sodium citrate, bismuth subsalicylate, guaiFENesin, heparin, lidocaine (PF), lidocaine-prilocaine, loperamide, LORazepam, magnesium hydroxide, magnesium hydroxide, meclizine, methocarbamol, morphine injection, ondansetron **OR** ondansetron (ZOFRAN) IV, mouth rinse, oxyCODONE-acetaminophen, pentafluoroprop-tetrafluoroeth, polyethylene glycol, sodium chloride  Assessment/ Plan:  Mr. Austin Marsh is a 67 y.o.  male with past medical history of hypothyroidism, depression with anxiety, schizoaffective, and COPD, who was admitted to [facility] on 10/16/2022 for Lactic acidosis [E87.20] Elevated troponin level [R79.89] Demand ischemia [I24.89] NSTEMI (non-ST elevated myocardial infarction) (HCC) [I21.4] Acute kidney injury (HCC) [N17.9] Septic shock (HCC) [A41.9, R65.21] Sepsis due to undetermined organism (HCC) [A41.9] Altered mental status, unspecified altered mental status type [R41.82] SIRS (systemic inflammatory response syndrome) (HCC) [R65.10]   Acute kidney injury with hyponatremia likely  secondary to  rhabdomyolysis.    -Creatinine up to 9.13.  Urine output only 325 cc over the preceding 24 hours.  We are planning for second dialysis treatment today and third dialysis treatment tomorrow.   Lab Results  Component Value Date   CREATININE 9.13 (H) 10/25/2022   CREATININE 7.81 (H) 10/24/2022   CREATININE 9.04 (H) 10/23/2022    Intake/Output Summary (Last 24 hours) at 10/25/2022 1145 Last data filed at 10/25/2022 0800 Gross per 24 hour  Intake 731.48 ml  Output 325 ml  Net 406.48 ml    2. Anemia of acute blood loss:  Lab Results  Component Value Date   HGB 7.9 (L) 10/24/2022  Continue to monitor CBC.  No urgent indication for blood transfusion.  3. Acute respiratory failure requiring HFNC, right lower lobe pneumonia noted with positive blood culture, bacteremia.  Continue supplemental oxygen.      LOS: 8 Austin Marsh 7/8/202411:45 AM

## 2022-10-25 NOTE — Progress Notes (Signed)
Progress Note   Patient: Austin Marsh ZOX:[id number] DOB: 1955-10-28 DOA: 10/16/2022     8 DOS: the patient was seen and examined on 10/25/2022       Subjective:  Patient seen and examined at bedside this morning More awake today and able to engage in some conversation but pleasantly confused Denies nausea vomiting or abdominal pain Subjective information Limited on account of altered mental status   Brief hospital course: "Eugune Lockner is a 67 y.o. male with medical history significant of COPD, hypothyroidism, depression with anxiety, BPH, CKD-3A, schizoaffective disorder with bipolar, tardive dyskinesia, OSA on CPAP, who presents with fall and AMS."  Caregiver at family care facility.  reported pt was in his normal health condition until 10:30 PM yesterday when pt had fall and was found on the floor. At normal baseline, patient is alert oriented x 3 but today is confused.  On admission, pt was lethargic, oriented to person and place, not to year or situation.  He reported right hip pain with moving right leg, low back pain, some chest pain which he was unable to characterize further, constipation, suprapubic pain.  He was found to have acute urinary retention (973 cc on bladder scan).  Foley catheter was placed.     In the ED - fever of 101, HR 123, RR 26, stable O2 sat on room air.  Per EMS report, patient had low BP, but at time of admission BP was stable at 124/73.   Chest x-ray negative.   CT of head negative for acute intracranial abnormalities.   CT Chest/Abd/Pelvis: Moderate coronary artery calcification. No acute intrathoracic or intra-abdominal pathology identified. Stable 9 mm right adrenal adenoma. Stable changes of adrenal hyperplasia. Marked distention of the bladder possibly reflecting changes of bladder outlet obstruction. Mild prostatic hypertrophy.  Probable post TURP changes.  X-ray of bilateral hips/pelvis negative for fracture.   X-ray of L-spine and T-spine negative for  acute fracture, did showed degenerative disc disease    Notable labs - WBC 11.2, severe AKI with Cr 7.78, sodium 123, negative COVID PCR, lactic acid> 9.0 ---> 4.9, troponin level 172, 406, 804, 784.  Sodium 123, magnesium 2.3,  Hemoglobin 11.5.   Pt was admitted to PCU. Subsequently found to have severe rhabdomyolysis with CK peaking above 50k. IV heparin was started and cardiology consulted due to troponin elevation.   7/1 -- pt hypotensive, requiring IV fluid boluses.  Worsening encephalopathy, Hbg dropped to 6.3, large area of ecchymosis and swelling in the left neck & upper shoulder concerning for hematoma, later confirmed on CT check and neck soft tissue.  Pt was transferred to stepdown for developing shock. PCCM consulted.  Transfused 2 units pRBC's, 1 FFP.   7/2 -- BP's improved after 3 units pRBC's, 1 FFP total.  Hbg improved from nadir 5.4 to 10.1 >> 9.3 this AM.  +Blood cultures with GPC's and GPR's.  ID consulted.  CK trending down. Nephrology consulted with worsening renal function and poor urine output reported.  7/3-7/8 patient's renal function continues to worsen and she continues to remain altered nephrologist made decision for hemodialysis initiation.  Patient's wife is in the psych unit and according to psychiatry lacks capacity to make decision for patient.   Assessment and Plan:     Acute kidney injury (HCC)-worsening Stage 3a CKD: Baseline Cr 1.17 on 06/04/2022.  Cr on admission 7.78 AKI likely due to severe rhabdomyolysis, sepsis, dehydration, urinary retention secondary to bladder outlet obstruction. Case discussed with nephrology team Avoid nephrotoxic  medications Continue bicarb drip Had dialysis catheter placement on 10/23/2022 Hemodialysis was initiated on patient on 10/23/2022 To engage in some conversation today but still confused We will continue to optimize renal function We will continue pursuing any additional family members for discussion of goals of care      * Septic shock (HCC)-improved On admission, tachycardic, tachypneic, febrile. Lactic acid was above 9 meeting septic shock criteria (with dehydration, AKI, rhabdo contributing).  Hypotension seemed more due to acute blood loss 2/2 left neck hematoma.  BP responded to fluids, blood products and midodrine, did not require pressors. Has bacteremia and RLL pneumonia suspect acute aspiration on 7/1 while encephalopathic  Patient completed a course of Zyvox and currently antibiotics have been discontinued by infectious disease specialist we appreciate input Continue to monitor closely Infectious disease on board   Acute respiratory failure with hypoxia (HCC) Right lower lobe pneumonia, not POA, suspect acute aspiration event on 7/1. Antibiotics completed and discontinued by ID on 10/22/2022 Currently requiring 2 L of intranasal oxygen Wean off as tolerated   Bacteremia due to Staphylococcus Initial blood cx +staph epidermidis felt contaminant.  Has completed antibiotics and discontinued by ID on 10/22/2022 Infectious disease on board we appreciate input   Hematoma-resolved Hematoma Left Neck/Shoulder  Acute Blood Loss Anemia Hypotension due to above and ?septic shock 7/1 - Hbg dropped from 11.5 >> 6.3 and pt noted to have new swelling and ecchymosis at the left neck & upper shoulder.  --Heparin was stopped  --Transfused total 3 units pRBC's and 1 FFP --Transferred to stepdown 7/1 --PCCM consulted for pressors if needed - signed off, BP's stabilized Continue to monitor CBC closely   Rhabdomyolysis-improved CK peaked > 50000 Given aggressive IV hydration for this and hypotension, then devoped hypoxia on 7/1, concern for pulmonary edema but found to have RLL collapse.  Fluid held as patient requiring dialysis now CPK downtrending   Acute metabolic encephalopathy CT head on admission negative.  Multifactorial due to metabolic derangements, sepsis / bacteremia, underlying psych  conditions Resume home psychiatric meds once safe to take PO Continue delirium precaution -As needed Ativan for severe agitation otherwise we will avoid benzodiazepine use     Acute retention of urine Patient has underlying BPH Foley placed on admission. Blood pressure improved however patient unable to take his oral Cardura Resume Proscar when able to take oral meds Outpatient urology follow-up For voiding trial when patient is improved   NSTEMI (non-ST elevated myocardial infarction) (HCC) Elevated troponin - demand ischemia & delayed clearance with severe AKI. -Cardiology was consulted, signed off --IV heparin stopped 7/1 AM with acute anemia and hematoma Continue to monitor closely   Fall Unknown cause, pt was found on the floor at his family care home, per caregiver's history on admission. --PT OT evaluation when clinically improved --Imaging was negative for acute injuries / fractures --Neck hematoma developed 7/1 Confirmed on CT chest and neck soft tissue obtained later on 7/1 PM   COPD (chronic obstructive pulmonary disease) (HCC) Stable Continue PRN bronchodilators and Mucinex   Hyponatremia-improving Sodium 124 on 10/21/2022 Saline fluids stopped due to hypoxia on 7/1 Monitor sodium level closely Nephrology following   Hypothyroidism Continue Synthroid     Schizoaffective disorder, bipolar type (HCC) Resume home PO meds when able Allergy listed to haldol, will use IV Ativan PRN if agitation   Depression with anxiety -Resume home Benztropine, BuSpar, clozapine, Depakote, Ingrezza when able to take PO meds   Tobacco abuse Nicotine patch ordered  Disposition: Patient still meets criteria for inpatient management as he continues to require hemodialysis    Planned Discharge Destination: Home     Physical examination:   General exam: Still laying in bed, confused requiring mittens HEENT: Ecchymosis and swelling noted at the left neck appears improved   Respiratory system: CTAB, diminished bases Cardiovascular system: normal S1/S2, RRR, trace pedal edema.   Gastrointestinal system: soft, NT, ND Central nervous system: very limited exam due to encephalopathy and incoherent speech, follows commands Extremities: Bilateral upper extremity mittens Skin: dry, intact, normal temperature, pale appearing Psychiatry: Still remains encephalopathic       Data Reviewed: I have reviewed patient's labs, vitals, nursing documentation, nephrology documentation and also discussed the case with nephrologist  Family Communication:  Spoke with case management who also spoke with patient's group home and apparently patient's may have a sister. We are trying to get in touch with patient's sister to help with decision making   Patient's wife is in the psych unit and according to psychiatry lacks capacity to make decision for patient.    Vitals:   10/25/22 1530 10/25/22 1600 10/25/22 1630 10/25/22 1700  BP: 135/63 (!) 143/62 139/70 (!) 142/64  Pulse: 88 92 86 90  Resp: 16 (!) 21 20 19   Temp:      TempSrc:      SpO2: 98% 99% 98% 97%  Weight:      Height:         Author: Loyce Dys, MD 10/25/2022 5:11 PM  For on call review [email or web address].

## 2022-10-25 NOTE — Progress Notes (Addendum)
Speech Language Pathology Treatment: Dysphagia  Patient Details Name: Austin Marsh MRN: [id number] DOB: 05-02-1955 Today's Date: 10/25/2022 Time: 1005-1050 SLP Time Calculation (min) (ACUTE ONLY): 45 min  Assessment / Plan / Recommendation Clinical Impression  Pt seen today for ongoing assessment of swallowing. Pt has NOT exhibited a consistent, functional State in recent week per SLP/NSG/chart notes for safe initiation of an oral diet -- his State continues to wax-wane. Pt awake, talkative w/ tangential statements and intermittent mumbled speech. Pt was oriented to his name but nothing else -- he was able to identify where is used to live and that he liked "fishing - I caught a 5lb large mouth bass you know". He did not follow commands consistently but w/ cues, he followed through w/ 1-step instructions often.  Pt has been agitated in the past requiring sedating medications; pain medication this morning per NSG d/t his "neck". He is wearing Bilat. Mitts as restraints. Noted R head tilt/lean w/ bruising on L neck/shoulder area -- Baseline at admit which could be impacting the R head tilt/lean. Suggested PT consult to NSG. On [place] 2L, afebrile. WBC WNL.   Pt appears to present w/ oropharyngeal phase dysphagia in setting of declined Cognitive awareness and overall presentation; illness. ANY Cognitive decline can impact overall awareness/timing of swallow and safety during po tasks which increases risk for aspiration, choking thus Pulmonary decline.      A full oral diet is not safe at this time d/t Severe risk for aspiration. He does present today w/ min improvement in Cognitive attention/focus for po intake.   Pt was presented w/ oral care then (8) trials of ice chips and (10+) trials of puree (1/2 tsp boluses). All trials placed at lips via spoon to which he opened mouth readily to accept. He was instruction, and often followed through w/, stating "Ready" for the next bolus fed to him by this SLP. No  immediate, overt pharyngeal phase swallow deficits were appreciated; no decline in vocal quality, no immediate cough, no decline in O2 sats/RR(99%). Oral phase was c/b increased oral attention and awareness -- pt exhibited functional bolus management and timely A-P transfer to swallow; he licked/swept bolus residue on mustache w/ lingual movements.  A congested cough was noted b/t trials x1 w/ no other decompensation/decline noted.  Less confusion of OM tasks and during oral care noted - he allowed oral care to be completed. Encouraged pt to hold his head more midline but this appeared fatiguing for him.         In setting of current presentation, recommend continued NPO status w/ frequent oral care to engage oral stim/swallow and to provide hygiene. Then, recommend Therapeutic tsps of Puree w/ CRUSHED Meds(necessary medications such as Psychiatric medications per MD ok) delivered also by NSG. Recommend strict aspiration precautions and stop feeding if any s/s of aspiration noted by NSG. ST services will f/u w/ pt's status tomorrow for ongoing assessement for appropriateness for an oral diet. MD/NSG updated, agreed. Precautions posted in room. Dietician following.      HPI HPI: Pt is a 67 y.o. male with medical history significant of COPD, hypothyroidism, depression with anxiety, BPH, CKD-3A, schizoaffective disorder with bipolar, tardive dyskinesia, OSA on CPAP, who presents with fall and AMS.  Pt resides at a [facility].  Per care giver, at normal baseline, patient is alert oriented x 3.  Upon admit to the ED, the pt was lethargic and confused. Pt admited w/ SIRS and rhabdomyolysis w/ hyponatremia.  He  required BIPAP in ICU -- NSG reported concern he may have aspirated secretions/phelgm d/t his thick secretions/congestion.    CT Head: no acute abnormaility.  CT Chest: Debris in the right lower lobe bronchus (series  3/image 34), new from recent prior, with new right lower lobe   atelectasis/collapse. This appearance favors mucous plugging.      SLP Plan  Continue with current plan of care      Recommendations for follow up therapy are one component of a multi-disciplinary discharge planning process, led by the attending physician.  Recommendations may be updated based on patient status, additional functional criteria and insurance authorization.    Recommendations  Diet recommendations:  (therapeutic trials of Puree w/ NSG re-initiated) Medication Administration: Crushed with puree Supervision: Staff to assist with self feeding;Full supervision/cueing for compensatory strategies Compensations: Minimize environmental distractions;Slow rate;Small sips/bites;Lingual sweep for clearance of pocketing Postural Changes and/or Swallow Maneuvers: Out of bed for meals;Upright 30-60 min after meal;Seated upright 90 degrees                 (Rehab following; Palliative Care; Dietician) Oral care QID;Oral care before and after PO;Staff/trained caregiver to provide oral care   Frequent or constant Supervision/Assistance Dysphagia, oropharyngeal phase (R13.12)     Continue with current plan of care      Jerilynn Som, MS, CCC-SLP Speech Language Pathologist Rehab Services; [facility] [phone] (ascom) Austin Marsh  10/25/2022, 3:20 PM

## 2022-10-25 NOTE — Progress Notes (Signed)
Pt arrived back on the unit

## 2022-10-25 NOTE — Progress Notes (Signed)
Hemodialysis Note  Received patient in bed to unit. Alert and oriented. Informed consent signed and in chart.   Treatment initiated:1437 Treatment completed:1717  Patient tolerated treatment well. Transported back to the room alert, without acute distress. Report given to patient's RN.  Access used:Right Femoral Catheter  Access issues: None   Total UF removed:1000 ml/ 1 liter  Medications given:Heparin  Post HD VS: Stable  Post HD weight:Not obtained   Bartolo Darter, RN [facility]

## 2022-10-25 NOTE — Progress Notes (Signed)
Step daughter Lamar Laundry) and wife at bedside

## 2022-10-25 NOTE — Progress Notes (Signed)
Initial Nutrition Assessment  DOCUMENTATION CODES:   Not applicable  INTERVENTION:   Recommend NGT placement and nutrition support until patient is able to meet his estimated needs via oral intake.   If NGT placed, recommend:  Osmolite 1.5@60ml /hr- Initiate at 40ml/hr and increase by 23ml/hr q 8 hours until goal rate is reached.   Free water flushes 30ml q4 hours to maintain tube patency   Regimen provides 2160kcal/day, 90g/day protein and 121ml/day of free water  Rena-vit daily via tube   Pt at high refeed risk; recommend monitor potassium, magnesium and phosphorus labs daily until stable  Daily weights   Bowel regimen per MD   NUTRITION DIAGNOSIS:   Inadequate oral intake related to acute illness as evidenced by NPO status.  GOAL:   Patient will meet greater than or equal to 90% of their needs  MONITOR:   Diet advancement, Labs, Weight trends, I & O's, Skin, TF tolerance  REASON FOR ASSESSMENT:   Consult Assessment of nutrition requirement/status  ASSESSMENT:   67 y/o male with medical history significant of COPD, hypothyroidism, depression with anxiety, BPH, CKD-3A, schizoaffective disorder with bipolar, tardive dyskinesia and OSA on CPAP who is admitted with fall, PNA, sepsis, bacteremia, rhabdomyolysis, AMS and AKI.  Met with pt in room today. Pt pleasantly confused this morning; pt is able to answer some questions appropriately but reports that he is "in rehab". Pt reports good appetite and oral intake at baseline. Pt reports that he is hungry today and is asking when he can eat. Pt with dysphagia likely secondary to AMS and acute illness. Pt being followed by SLP and has remained NPO for >7 days. Pt re-evaluated by SLP this morning and remains NPO. Recommend NGT placement and nutrition support to help pt meet his estimated needs until he is able to take in sufficient oral intake. Tube placement was discussed with MD and patient. Pt agreeable to NGT; however, RD  unsure if patient fully understands what is being asked of him. Pt is at high refeed risk. No BM since 6/29; MD aware. RD will monitor for diet advancement.   Pt with AKI requiring temporary HD starting 7/6. Plan is for HD again today.    Medications reviewed and include: nicotine, protonix, Na bicarbonate   Labs reviewed: Na 130(L), K 5.1 wnl, BUN 100(H), creat 9.13(H) P 5.8(H), Mg 1.8 wnl- 7/2 Hgb 7.9(L), Hct 23.2(L)- 7/7 AIC 5.7(H)- 6/30  NUTRITION - FOCUSED PHYSICAL EXAM:  Flowsheet Row Most Recent Value  Orbital Region No depletion  Upper Arm Region No depletion  Thoracic and Lumbar Region No depletion  Buccal Region No depletion  Temple Region No depletion  Clavicle Bone Region No depletion  Clavicle and Acromion Bone Region No depletion  Scapular Bone Region No depletion  Dorsal Hand No depletion  Patellar Region No depletion  Anterior Thigh Region No depletion  Posterior Calf Region No depletion  Edema (RD Assessment) Moderate  Hair Reviewed  Eyes Reviewed  Mouth Reviewed  Skin Reviewed  Nails Reviewed   Diet Order:   Diet Order             Diet NPO time specified  Diet effective now                  EDUCATION NEEDS:   Not appropriate for education at this time  Skin:  Skin Assessment: Reviewed RN Assessment (Hematoma Left Neck/Shoulder, ecchymosis)  Last BM:  6/29  Height:   Ht Readings from Last 1 Encounters:  10/18/22 5\' 9"  (1.753 m)    Weight:   Wt Readings from Last 1 Encounters:  10/25/22 86.4 kg    Ideal Body Weight:  72.7 kg  BMI:  Body mass index is 28.13 kg/m.  Estimated Nutritional Needs:   Kcal:  1900-2200kcal/day  Protein:  90-105g/day  Fluid:  1.9-2.2L/day  Betsey Holiday MS, RD, LDN Please refer to [facility] for RD and/or RD on-call/weekend/after hours pager

## 2022-10-25 NOTE — TOC Progression Note (Signed)
Transition of Care [facility]) - Progression Note    Patient Details  Name: Austin Marsh MRN: [id number] Date of Birth: February 04, 1956  [facility]) CM/SW Contact  Kreg Shropshire, RN Phone Number: 10/25/2022, 12:28 PM  Clinical Narrative:    Cm spoke with pt step-daughter Jae Dire [phone]. She stated that pt has a sister but she has no idea who the sister is nor has any contact information for sister. Besides Wife and step daughter, there is no other next of kin according to [place]. Cm informed Lamar Laundry that a APS report was made. Lamar Laundry state that pt was wheelchair bound prior to hospitalization and not active. Cm will continue to follow for TOC needs.        Expected Discharge Plan and Services                                               Social Determinants of Health (SDOH) Interventions SDOH Screenings   Food Insecurity: No Food Insecurity (10/17/2022)  Housing: Patient Unable To Answer (10/17/2022)  Tobacco Use: High Risk (10/16/2022)    Readmission Risk Interventions    03/17/2020    1:48 PM  Readmission Risk Prevention Plan  Transportation Screening Complete  Palliative Care Screening Not Applicable  Medication Review (RN Care Manager) Complete

## 2022-10-26 DIAGNOSIS — A419 Sepsis, unspecified organism: Secondary | ICD-10-CM | POA: Diagnosis not present

## 2022-10-26 DIAGNOSIS — R6521 Severe sepsis with septic shock: Secondary | ICD-10-CM | POA: Diagnosis not present

## 2022-10-26 LAB — BASIC METABOLIC PANEL
Anion gap: 16 — ABNORMAL HIGH (ref 5–15)
BUN: 76 mg/dL — ABNORMAL HIGH (ref 8–23)
CO2: 27 mmol/L (ref 22–32)
Calcium: 7.3 mg/dL — ABNORMAL LOW (ref 8.9–10.3)
Chloride: 90 mmol/L — ABNORMAL LOW (ref 98–111)
Creatinine, Ser: 6.31 mg/dL — ABNORMAL HIGH (ref 0.61–1.24)
GFR, Estimated: 9 mL/min — ABNORMAL LOW (ref >60)
Glucose, Bld: 78 mg/dL (ref 70–99)
Potassium: 4.4 mmol/L (ref 3.5–5.1)
Sodium: 133 mmol/L — ABNORMAL LOW (ref 135–145)

## 2022-10-26 LAB — CK: Total CK: 881 U/L — ABNORMAL HIGH (ref 49–397)

## 2022-10-26 MED ORDER — NEPRO/CARBSTEADY PO LIQD
237.0000 mL | Freq: Three times a day (TID) | ORAL | Status: DC
  Administered 2022-10-27 – 2022-11-04 (×17): 237 mL via ORAL

## 2022-10-26 MED ORDER — RENA-VITE PO TABS
1.0000 | ORAL_TABLET | Freq: Every day | ORAL | Status: DC
  Administered 2022-10-26 – 2022-11-03 (×9): 1 via ORAL
  Filled 2022-10-26 (×9): qty 1

## 2022-10-26 MED ORDER — LACTULOSE 10 GM/15ML PO SOLN
30.0000 g | Freq: Every day | ORAL | Status: DC
  Administered 2022-10-26 – 2022-11-03 (×7): 30 g via ORAL
  Filled 2022-10-26 (×7): qty 60

## 2022-10-26 NOTE — Progress Notes (Signed)
Speech Language Pathology Treatment: Dysphagia  Patient Details Name: Austin Marsh MRN: [id number] DOB: 02-18-1956 Today's Date: 10/26/2022 Time: 7829-5621 SLP Time Calculation (min) (ACUTE ONLY): 40 min  Assessment / Plan / Recommendation Clinical Impression  Pt seen today for ongoing assessment of swallowing. Pt has NOT exhibited a consistent, functional State in recent week per SLP/NSG/chart notes for safe initiation of an oral diet -- his State continues to wax-wane. Pt awake, talkative w/ tangential statements and intermittent mumbled speech. Pt was oriented to his name but nothing else. He often sang and talked about "prison" and persons' names. He did not follow commands consistently but w/ cues, he followed through w/ 1-step instructions more often.  Pt has been agitated in the past requiring sedating medications; pain medication this morning per NSG d/t his "neck". He is wearing Bilat. Mitts as restraints. Noted R head tilt/lean w/ bruising on L neck/shoulder area -- Baseline at admit which could be impacting the R head tilt/lean. Suggested PT consult to NSG. On [place] 2L, afebrile. WBC WNL.   Pt appears to present w/ oropharyngeal phase dysphagia in setting of declined Cognitive awareness and overall presentation; illness. ANY Cognitive decline can impact overall awareness/timing of swallow and safety during po tasks which increases risk for aspiration, choking thus Pulmonary decline.      He does present today w/ min improvement in Cognitive attention/focus for po intake AND he presented w/ improved attention and oropharyngeal swallowing of Puree and Nectar liquid consistency vis TSP (for bolus amount control).    Pt was presented w/ oral care then trials of puree (1/2 tsp boluses), then Nectar liquids. He was instructed, and often followed through w/, stating "Ready" for the next bolus fed to him by this SLP. No immediate, overt pharyngeal phase swallow deficits were appreciated; no decline  in vocal quality, no immediate cough, no decline in O2 sats/RR(97-99%). Oral phase was c/b increased oral attention and awareness -- pt exhibited functional bolus management and timely A-P transfer to swallow; he licked/swept bolus residue on mustache w/ lingual movements. He consumed ~3 ozs of each consistency. A congested cough was noted b/t trials x1 w/ no other decompensation/decline noted.  Less confusion during oral care noted - he allowed oral care to be completed. Encouraged pt to hold his head more midline but this appeared fatiguing for him.         In setting of improved oropharyngeal attention and swallowing w/ dysphagia diet, recommend a trial initiation of Dysphagia level 1 (puree) foods w/ Nectar liquids via TSP. Strict aspiration precautions and 100% Supervision and Feeding support at meals. Frequent oral care to engage oral stim/swallow and to provide hygiene. Pills CRUSHED in Puree. Stop feeding if any s/s of aspiration noted by NSG. ST services will f/u w/ pt's status tomorrow for ongoing assessement and toleration of diet. MD/NSG updated, agreed. Precautions posted in room. Dietician following.    HPI HPI: Pt is a 67 y.o. male with medical history significant of COPD, hypothyroidism, depression with anxiety, BPH, CKD-3A, schizoaffective disorder with bipolar, tardive dyskinesia, OSA on CPAP, who presents with fall and AMS.  Pt resides at a [facility].  Per care giver, at normal baseline, patient is alert oriented x 3.  Upon admit to the ED, the pt was lethargic and confused. Pt admited w/ SIRS and rhabdomyolysis w/ hyponatremia.  He required BIPAP in ICU -- NSG reported concern he may have aspirated secretions/phelgm d/t his thick secretions/congestion.    CT Head: no acute  abnormaility.  CT Chest: Debris in the right lower lobe bronchus (series  3/image 34), new from recent prior, with new right lower lobe  atelectasis/collapse. This appearance favors mucous plugging.       SLP Plan  Continue with current plan of care      Recommendations for follow up therapy are one component of a multi-disciplinary discharge planning process, led by the attending physician.  Recommendations may be updated based on patient status, additional functional criteria and insurance authorization.    Recommendations  Diet recommendations: Dysphagia 1 (puree);Nectar-thick liquid Liquids provided via: Teaspoon;No straw Medication Administration: Crushed with puree Supervision: Staff to assist with self feeding;Full supervision/cueing for compensatory strategies Compensations: Minimize environmental distractions;Slow rate;Small sips/bites;Lingual sweep for clearance of pocketing Postural Changes and/or Swallow Maneuvers: Out of bed for meals;Upright 30-60 min after meal;Seated upright 90 degrees                 (Palliative Care; Dietician following) Oral care QID;Oral care before and after PO;Staff/trained caregiver to provide oral care   Frequent or constant Supervision/Assistance Dysphagia, oropharyngeal phase (R13.12) (Cognitive decline ongoing)     Continue with current plan of care       Jerilynn Som, MS, CCC-SLP Speech Language Pathologist Rehab Services; [facility] [phone] (ascom) Sarrinah Gardin  10/26/2022, 3:27 PM

## 2022-10-26 NOTE — Progress Notes (Signed)
[facility]  ROUNDING NOTE   Subjective:   Patient due for dialysis this afternoon. Azotemia improving with dialysis. BUN down to 76 and creatinine currently 6.31. Urine output 565 cc over the preceding 24 hours.   Objective:  Vital signs in last 24 hours:  Temp:  [97.7 F (36.5 C)-98.2 F (36.8 C)] 98.2 F (36.8 C) (07/09 1200) Pulse Rate:  [67-94] 90 (07/09 1600) Resp:  [15-25] 21 (07/09 1600) BP: (121-158)/(49-102) 147/72 (07/09 1600) SpO2:  [91 %-99 %] 95 % (07/09 1600) Weight:  [101.6 kg] 101.6 kg (07/09 0500)  Weight change: 15.2 kg Filed Weights   10/24/22 0500 10/25/22 0444 10/26/22 0500  Weight: 101.4 kg 86.4 kg 101.6 kg    Intake/Output: I/O last 3 completed shifts: In: 1054.9 [I.V.:1054.9] Out: 1770 [Urine:770; Other:1000]   Intake/Output this shift:  Total I/O In: 240 [P.O.:240] Out: 30 [Urine:30]  Physical Exam: General: No acute distress  Head: Normocephalic, atraumatic. Dry oral mucosal membranes  Eyes: Anicteric  Lungs:  Scattered rhonchi, normal effort  Heart: Regular rate and rhythm  Abdomen:  Soft, nontender  Extremities: 2+ generalized edema.  Neurologic: Awake, alert  Skin: Weeping edema  Access: Right femoral temporary dialysis catheter  Foley in place  Basic Metabolic Panel: Recent Labs  Lab 10/22/22 0409 10/23/22 0521 10/24/22 0615 10/25/22 0546 10/26/22 0419  NA 127* 125* 130* 130* 133*  K 5.0 5.3* 5.0 5.1 4.4  CL 86* 83* 88* 86* 90*  CO2 28 27 29 28 27   GLUCOSE 103* 78 86 77 78  BUN 73* 91* 80* 100* 76*  CREATININE 7.66* 9.04* 7.81* 9.13* 6.31*  CALCIUM 7.3* 7.1* 7.0* 7.2* 7.3*     Liver Function Tests: Recent Labs  Lab 10/20/22 0145 10/21/22 0405  AST 480* 278*  ALT 120* 95*  ALKPHOS 31* 39  BILITOT 0.8 1.0  PROT 4.6* 4.4*  ALBUMIN 2.4* 1.9*    No results for input(s): "LIPASE", "AMYLASE" in the last 168 hours.  No results for input(s): "AMMONIA" in the last 168 hours.  CBC: Recent Labs   Lab 10/20/22 0145 10/20/22 0755 10/21/22 0405 10/21/22 0800 10/21/22 1351 10/22/22 0409 10/23/22 0521 10/24/22 0615  WBC 11.3*  --  10.8*  --   --  10.2 7.4 6.7  NEUTROABS  --   --   --   --   --  8.3* 4.7 4.0  HGB 8.8*   < > 8.6* 8.6* 8.7* 9.0* 7.5* 7.9*  HCT 25.1*   < > 24.3* 24.1* 24.6* 25.7* 21.3* 23.2*  MCV 87.2  --  87.4  --   --  87.7 87.7 90.6  PLT 81*  --  69*  --   --  71* 70* 72*   < > = values in this interval not displayed.     Cardiac Enzymes: Recent Labs  Lab 10/22/22 0409 10/23/22 0521 10/24/22 0615 10/25/22 0546 10/26/22 0419  CKTOTAL 2,838* 1,354* 909* 830* 881*     BNP: Invalid input(s): "POCBNP"  CBG: No results for input(s): "GLUCAP" in the last 168 hours.   Microbiology: Results for orders placed or performed during the hospital encounter of 10/16/22  Blood Culture (routine x 2)     Status: None (Preliminary result)   Collection Time: 10/16/22 11:44 PM   Specimen: BLOOD LEFT ARM  Result Value Ref Range Status   Specimen Description   Final    BLOOD LEFT ARM Performed at [facility], [address], [place], [place] [address]  Special Requests   Final    BOTTLES DRAWN AEROBIC AND ANAEROBIC Blood Culture adequate volume Performed at [facility], [address] [place], [place] [address]    Culture  Setup Time   Final    GRAM POSTIVE ORGANISM AEROBIC BOTTLE ONLY CRITICAL VALUE NOTED.  VALUE IS CONSISTENT WITH PREVIOUSLY REPORTED AND CALLED VALUE.    Culture   Final    GRAM POSITIVE RODS SENT TO [facility] FOR ID Performed at [facility], [address], [place], [place] [address]    Report Status PENDING  Incomplete  Blood Culture (routine x 2)     Status: Abnormal (Preliminary result)   Collection Time: 10/16/22 11:44 PM   Specimen: BLOOD RIGHT ARM  Result Value Ref Range Status   Specimen Description   Final    BLOOD RIGHT ARM Performed at [facility], [address] [place],  [place] [address]    Special Requests   Final    BOTTLES DRAWN AEROBIC AND ANAEROBIC Blood Culture adequate volume Performed at [facility], [address] [place], [place] [address]    Culture  Setup Time   Final    GRAM POSITIVE COCCI ANAEROBIC BOTTLE ONLY CRITICAL RESULT CALLED TO, READ BACK BY AND VERIFIED WITH: NATHAN BELUE @ 2253 10/17/22 [facility] GRAM POSITIVE RODS AEROBIC BOTTLE ONLY CRITICAL RESULT CALLED TO, READ BACK BY AND VERIFIED WITH: JASON ROBBINS@0310  10/19/22 [facility]    Culture (A)  Final    STAPHYLOCOCCUS EPIDERMIDIS THE SIGNIFICANCE OF ISOLATING THIS ORGANISM FROM A SINGLE SET OF BLOOD CULTURES WHEN MULTIPLE SETS ARE DRAWN IS UNCERTAIN. PLEASE NOTIFY THE MICROBIOLOGY DEPARTMENT WITHIN ONE WEEK IF SPECIATION AND SENSITIVITIES ARE REQUIRED. GRAM POSITIVE RODS SENT TO [facility] FOR ID AND SUSCEPTIBILITIES Performed at [facility], [address] [place], [place] [address]    Report Status PENDING  Incomplete  SARS Coronavirus 2 by RT PCR (hospital order, performed in [facility] hospital lab) *cepheid single result test* Anterior Nasal Swab     Status: None   Collection Time: 10/16/22 11:44 PM   Specimen: Anterior Nasal Swab  Result Value Ref Range Status   SARS Coronavirus 2 by RT PCR NEGATIVE NEGATIVE Final    Comment: (NOTE) SARS-CoV-2 target nucleic acids are NOT DETECTED.  The SARS-CoV-2 RNA is generally detectable in upper and lower respiratory specimens during the acute phase of infection. The lowest concentration of SARS-CoV-2 viral copies this assay can detect is 250 copies / mL. A negative result does not preclude SARS-CoV-2 infection and should not be used as the sole basis for treatment or other patient management decisions.  A negative result may occur with improper specimen collection / handling, submission of specimen other than nasopharyngeal swab, presence of viral mutation(s) within the areas targeted by this assay, and inadequate number of viral  copies (<250 copies / mL). A negative result must be combined with clinical observations, patient history, and epidemiological information.  Fact Sheet for Patients:   RoadLapTop.co.za  Fact Sheet for Healthcare Providers: http://kim-miller.com/  This test is not yet approved or  cleared by the [place] FDA and has been authorized for detection and/or diagnosis of SARS-CoV-2 by FDA under an Emergency Use Authorization (EUA).  This EUA will remain in effect (meaning this test can be used) for the duration of the COVID-19 declaration under Section 564(b)(1) of the Act, 21 U.S.C. section 360bbb-3(b)(1), unless the authorization is terminated or revoked sooner.  Performed at [facility], [phone]  [address] [place], [place] [address]   Blood Culture ID Panel (Reflexed)     Status: Abnormal   Collection Time: 10/16/22 11:44 PM  Result Value Ref Range Status   Enterococcus faecalis NOT DETECTED NOT DETECTED Final   Enterococcus Faecium NOT DETECTED NOT DETECTED Final   Listeria monocytogenes NOT DETECTED NOT DETECTED Final   Staphylococcus species DETECTED (A) NOT DETECTED Final    Comment: CRITICAL RESULT CALLED TO, READ BACK BY AND VERIFIED WITH: NATHAN BELUE @ 2253 10/17/22 [facility]    Staphylococcus aureus (BCID) NOT DETECTED NOT DETECTED Final   Staphylococcus epidermidis DETECTED (A) NOT DETECTED Final    Comment: Methicillin (oxacillin) resistant coagulase negative staphylococcus. Possible blood culture contaminant (unless isolated from more than one blood culture draw or clinical case suggests pathogenicity). No antibiotic treatment is indicated for blood  culture contaminants. CRITICAL RESULT CALLED TO, READ BACK BY AND VERIFIED WITH: NATHAN BELUE @ 2253 10/17/22 [facility]    Staphylococcus lugdunensis NOT DETECTED NOT DETECTED Final   Streptococcus species NOT DETECTED NOT DETECTED Final   Streptococcus agalactiae NOT DETECTED NOT  DETECTED Final   Streptococcus pneumoniae NOT DETECTED NOT DETECTED Final   Streptococcus pyogenes NOT DETECTED NOT DETECTED Final   A.calcoaceticus-baumannii NOT DETECTED NOT DETECTED Final   Bacteroides fragilis NOT DETECTED NOT DETECTED Final   Enterobacterales NOT DETECTED NOT DETECTED Final   Enterobacter cloacae complex NOT DETECTED NOT DETECTED Final   Escherichia coli NOT DETECTED NOT DETECTED Final   Klebsiella aerogenes NOT DETECTED NOT DETECTED Final   Klebsiella oxytoca NOT DETECTED NOT DETECTED Final   Klebsiella pneumoniae NOT DETECTED NOT DETECTED Final   Proteus species NOT DETECTED NOT DETECTED Final   Salmonella species NOT DETECTED NOT DETECTED Final   Serratia marcescens NOT DETECTED NOT DETECTED Final   Haemophilus influenzae NOT DETECTED NOT DETECTED Final   Neisseria meningitidis NOT DETECTED NOT DETECTED Final   Pseudomonas aeruginosa NOT DETECTED NOT DETECTED Final   Stenotrophomonas maltophilia NOT DETECTED NOT DETECTED Final   Candida albicans NOT DETECTED NOT DETECTED Final   Candida auris NOT DETECTED NOT DETECTED Final   Candida glabrata NOT DETECTED NOT DETECTED Final   Candida krusei NOT DETECTED NOT DETECTED Final   Candida parapsilosis NOT DETECTED NOT DETECTED Final   Candida tropicalis NOT DETECTED NOT DETECTED Final   Cryptococcus neoformans/gattii NOT DETECTED NOT DETECTED Final   Methicillin resistance mecA/C DETECTED (A) NOT DETECTED Final    Comment: CRITICAL RESULT CALLED TO, READ BACK BY AND VERIFIED WITH: NATHAN BELUE @ 2253 10/17/22 [facility] Performed at [facility], [address] [place], [place] [address]   Respiratory (~20 pathogens) panel by PCR     Status: None   Collection Time: 10/17/22  9:26 AM   Specimen: Nasopharyngeal Swab; Respiratory  Result Value Ref Range Status   Adenovirus NOT DETECTED NOT DETECTED Final   Coronavirus 229E NOT DETECTED NOT DETECTED Final    Comment: (NOTE) The Coronavirus on the Respiratory  Panel, DOES NOT test for the novel  Coronavirus (2019 nCoV)    Coronavirus HKU1 NOT DETECTED NOT DETECTED Final   Coronavirus NL63 NOT DETECTED NOT DETECTED Final   Coronavirus OC43 NOT DETECTED NOT DETECTED Final   Metapneumovirus NOT DETECTED NOT DETECTED Final   Rhinovirus / Enterovirus NOT DETECTED NOT DETECTED Final   Influenza A NOT DETECTED NOT DETECTED Final   Influenza B NOT DETECTED NOT DETECTED Final   Parainfluenza Virus 1 NOT DETECTED NOT DETECTED Final   Parainfluenza Virus 2  NOT DETECTED NOT DETECTED Final   Parainfluenza Virus 3 NOT DETECTED NOT DETECTED Final   Parainfluenza Virus 4 NOT DETECTED NOT DETECTED Final   Respiratory Syncytial Virus NOT DETECTED NOT DETECTED Final   Bordetella pertussis NOT DETECTED NOT DETECTED Final   Bordetella Parapertussis NOT DETECTED NOT DETECTED Final   Chlamydophila pneumoniae NOT DETECTED NOT DETECTED Final   Mycoplasma pneumoniae NOT DETECTED NOT DETECTED Final    Comment: Performed at [facility] [place] [facility], [address], [place], [place] [address]  MRSA Next Gen by PCR, Nasal     Status: None   Collection Time: 10/18/22 10:53 AM   Specimen: Nasal Mucosa; Nasal Swab  Result Value Ref Range Status   MRSA by PCR Next Gen NOT DETECTED NOT DETECTED Final    Comment: (NOTE) The GeneXpert MRSA Assay (FDA approved for NASAL specimens only), is one component of a comprehensive MRSA colonization surveillance program. It is not intended to diagnose MRSA infection nor to guide or monitor treatment for MRSA infections. Test performance is not FDA approved in patients less than 78 years old. Performed at [facility], [address] [place], [place] [address]     Coagulation Studies: No results for input(s): "LABPROT", "INR" in the last 72 hours.   Urinalysis: No results for input(s): "COLORURINE", "LABSPEC", "PHURINE", "GLUCOSEU", "HGBUR", "BILIRUBINUR", "KETONESUR", "PROTEINUR", "UROBILINOGEN", "NITRITE", "LEUKOCYTESUR"  in the last 72 hours.  Invalid input(s): "APPERANCEUR"    Imaging: No results found.   Medications:    anticoagulant sodium citrate     sodium bicarbonate 150 mEq in sterile water 1,150 mL infusion 35 mL/hr at 10/26/22 1306    Chlorhexidine Gluconate Cloth  6 each Topical Daily   feeding supplement (NEPRO CARB STEADY)  237 mL Oral TID BM   lactulose  300 mL Rectal Daily   lidocaine  10 mL Intradermal Once   multivitamin  1 tablet Oral QHS   nicotine  21 mg Transdermal Daily   mouth rinse  15 mL Mouth Rinse 4 times per day   pantoprazole (PROTONIX) IV  40 mg Intravenous Q24H   acetaminophen **OR** acetaminophen, albuterol, alteplase, alum & mag hydroxide-simeth, anticoagulant sodium citrate, bismuth subsalicylate, guaiFENesin, heparin, lidocaine (PF), lidocaine-prilocaine, loperamide, LORazepam, magnesium hydroxide, magnesium hydroxide, meclizine, methocarbamol, morphine injection, ondansetron **OR** ondansetron (ZOFRAN) IV, mouth rinse, oxyCODONE-acetaminophen, pentafluoroprop-tetrafluoroeth, polyethylene glycol, sodium chloride  Assessment/ Plan:  Mr. Austin Marsh is a 67 y.o.  male with past medical history of hypothyroidism, depression with anxiety, schizoaffective, and COPD, who was admitted to [facility] on 10/16/2022 for Lactic acidosis [E87.20] Elevated troponin level [R79.89] Demand ischemia [I24.89] NSTEMI (non-ST elevated myocardial infarction) (HCC) [I21.4] Acute kidney injury (HCC) [N17.9] Septic shock (HCC) [A41.9, R65.21] Sepsis due to undetermined organism (HCC) [A41.9] Altered mental status, unspecified altered mental status type [R41.82] SIRS (systemic inflammatory response syndrome) (HCC) [R65.10]   Acute kidney injury with hyponatremia likely secondary to rhabdomyolysis.    -Creatinine currently down to 6.31.  Urine output 565 cc over the preceding 24 hours.  We are planning for dialysis session this afternoon.   Lab Results  Component Value Date   CREATININE  6.31 (H) 10/26/2022   CREATININE 9.13 (H) 10/25/2022   CREATININE 7.81 (H) 10/24/2022    Intake/Output Summary (Last 24 hours) at 10/26/2022 1629 Last data filed at 10/26/2022 1306 Gross per 24 hour  Intake 703.12 ml  Output 1230 ml  Net -526.88 ml    2. Anemia of acute blood loss:  Lab Results  Component Value Date  HGB 7.9 (L) 10/24/2022  Hemoglobin currently 7.9.  Continue to monitor CBC.  3. Acute respiratory failure requiring HFNC, right lower lobe pneumonia noted with positive blood culture, bacteremia.  Respiratory failure overall improved.  Patient with stable respiratory pattern at this time.      LOS: 9 Saud Bail 7/9/20244:29 PM

## 2022-10-26 NOTE — Progress Notes (Signed)
Progress Note   Patient: Austin Marsh ZOX:[id number] DOB: 1955-12-15 DOA: 10/16/2022     9 DOS: the patient was seen and examined on 10/26/2022       Subjective:  Patient seen and examined at bedside this morning in the presence of the nursing staff Mental status is slightly improved today compared to yesterday She is able to engage in some conversation Has been seen by speech therapist and diet has been advanced   Brief hospital course: "Austin Marsh is a 67 y.o. male with medical history significant of COPD, hypothyroidism, depression with anxiety, BPH, CKD-3A, schizoaffective disorder with bipolar, tardive dyskinesia, OSA on CPAP, who presents with fall and AMS."  Caregiver at family care facility.  reported pt was in his normal health condition until 10:30 PM yesterday when pt had fall and was found on the floor. At normal baseline, patient is alert oriented x 3 but today is confused.  On admission, pt was lethargic, oriented to person and place, not to year or situation.  He reported right hip pain with moving right leg, low back pain, some chest pain which he was unable to characterize further, constipation, suprapubic pain.  He was found to have acute urinary retention (973 cc on bladder scan).  Foley catheter was placed.     In the ED - fever of 101, HR 123, RR 26, stable O2 sat on room air.  Per EMS report, patient had low BP, but at time of admission BP was stable at 124/73.   Chest x-ray negative.   CT of head negative for acute intracranial abnormalities.   CT Chest/Abd/Pelvis: Moderate coronary artery calcification. No acute intrathoracic or intra-abdominal pathology identified. Stable 9 mm right adrenal adenoma. Stable changes of adrenal hyperplasia. Marked distention of the bladder possibly reflecting changes of bladder outlet obstruction. Mild prostatic hypertrophy.  Probable post TURP changes.  X-ray of bilateral hips/pelvis negative for fracture.   X-ray of L-spine and  T-spine negative for acute fracture, did showed degenerative disc disease    Notable labs - WBC 11.2, severe AKI with Cr 7.78, sodium 123, negative COVID PCR, lactic acid> 9.0 ---> 4.9, troponin level 172, 406, 804, 784.  Sodium 123, magnesium 2.3,  Hemoglobin 11.5.   Pt was admitted to PCU. Subsequently found to have severe rhabdomyolysis with CK peaking above 50k. IV heparin was started and cardiology consulted due to troponin elevation.   7/1 -- pt hypotensive, requiring IV fluid boluses.  Worsening encephalopathy, Hbg dropped to 6.3, large area of ecchymosis and swelling in the left neck & upper shoulder concerning for hematoma, later confirmed on CT check and neck soft tissue.  Pt was transferred to stepdown for developing shock. PCCM consulted.  Transfused 2 units pRBC's, 1 FFP.   7/2 -- BP's improved after 3 units pRBC's, 1 FFP total.  Hbg improved from nadir 5.4 to 10.1 >> 9.3 this AM.  +Blood cultures with GPC's and GPR's.  ID consulted.  CK trending down. Nephrology consulted with worsening renal function and poor urine output reported.   7/3-7/8 patient's renal function continues to worsen and she continues to remain altered nephrologist made decision for hemodialysis initiation.  Patient's wife is in the psych unit and according to psychiatry lacks capacity to make decision for patient.   Assessment and Plan:     Acute kidney injury (HCC)-worsening Stage 3a CKD: Baseline Cr 1.17 on 06/04/2022.  Cr on admission 7.78 AKI likely due to severe rhabdomyolysis, sepsis, dehydration, urinary retention secondary to bladder outlet  obstruction. Case discussed with nephrology team Avoid nephrotoxic medications Sodium bicarb drip discontinued Had dialysis catheter placement on 10/23/2022 Hemodialysis was initiated on patient on 10/23/2022 Able to engage in some conversation today but still confused We will continue to optimize renal function We will continue pursuing any additional family  members for discussion of goals of care     * Septic shock (HCC)-improved On admission, tachycardic, tachypneic, febrile. Lactic acid was above 9 meeting septic shock criteria (with dehydration, AKI, rhabdo contributing).  Hypotension seemed more due to acute blood loss 2/2 left neck hematoma.  BP responded to fluids, blood products and midodrine, did not require pressors. Has bacteremia and RLL pneumonia suspect acute aspiration on 7/1 while encephalopathic  Patient completed a course of Zyvox and currently antibiotics have been discontinued by infectious disease specialist we appreciate input Continue to monitor closely Infectious disease on board   Acute respiratory failure with hypoxia (HCC) Right lower lobe pneumonia, not POA, suspect acute aspiration event on 7/1. Antibiotics completed and discontinued by ID on 10/22/2022 Currently requiring 2 L of intranasal oxygen Wean off as tolerated   Bacteremia due to Staphylococcus Initial blood cx +staph epidermidis felt contaminant.  Has completed antibiotics and discontinued by ID on 10/22/2022 Infectious disease on board we appreciate input   Hematoma-resolved Hematoma Left Neck/Shoulder  Acute Blood Loss Anemia Hypotension due to above and ?septic shock 7/1 - Hbg dropped from 11.5 >> 6.3 and pt noted to have new swelling and ecchymosis at the left neck & upper shoulder.  --Heparin was stopped  --Transfused total 3 units pRBC's and 1 FFP --Transferred to stepdown 7/1 --PCCM consulted for pressors if needed - signed off, BP's stabilized Continue to monitor CBC closely   Rhabdomyolysis-improved CK peaked > 50000 Given aggressive IV hydration for this and hypotension, then devoped hypoxia on 7/1, concern for pulmonary edema but found to have RLL collapse.  Fluid held as patient requiring dialysis now CPK downtrending   Acute metabolic encephalopathy CT head on admission negative.  Multifactorial due to metabolic derangements, sepsis /  bacteremia, underlying psych conditions Resume home psychiatric meds once safe to take PO Continue delirium precautions -As needed Ativan for severe agitation otherwise we will avoid benzodiazepine use     Acute retention of urine Patient has underlying BPH Foley placed on admission. Blood pressure improved however patient unable to take his oral Cardura Resume Proscar when able to take oral meds Outpatient follow-up with urologist if unable to pass voiding trial when medically ready    NSTEMI (non-ST elevated myocardial infarction) (HCC) Elevated troponin - demand ischemia & delayed clearance with severe AKI. -Cardiology was consulted, signed off --IV heparin stopped 7/1 AM with acute anemia and hematoma Continue to monitor closely   Fall Unknown cause, pt was found on the floor at his family care home, per caregiver's history on admission. --PT OT evaluation when clinically improved --Imaging was negative for acute injuries / fractures --Neck hematoma developed 7/1 Confirmed on CT chest and neck soft tissue obtained later on 7/1 PM   COPD (chronic obstructive pulmonary disease) (HCC) Stable Continue PRN bronchodilators and Mucinex   Hyponatremia-improving Sodium 124 on 10/21/2022 Saline fluids stopped due to hypoxia on 7/1 Monitor sodium level closely Nephrology following   Hypothyroidism Continue Synthroid     Schizoaffective disorder, bipolar type (HCC) Resume home PO meds when able Allergy listed to haldol, will use IV Ativan PRN if agitation   Depression with anxiety -Resume home Benztropine, BuSpar, clozapine, Depakote, Ingrezza when  able to take PO meds   Tobacco abuse Nicotine patch ordered       Disposition: Patient still meets criteria for inpatient management as he continues to require hemodialysis    Planned Discharge Destination: Home     Physical examination:   General exam: Full laying in bed confused but improving HEENT: Ecchymosis and  swelling noted at the left neck appears improved  Respiratory system: CTAB, diminished bases Cardiovascular system: normal S1/S2, RRR, trace pedal edema.   Gastrointestinal system: soft, NT, ND Central nervous system: very limited exam due to encephalopathy and incoherent speech, follows commands Extremities: Bilateral upper extremity mittens Skin: dry, intact, normal temperature, pale appearing Psychiatry: Still remains encephalopathic but able to engage in some conversation      Data Reviewed:I have reviewed patient's lab results, vitals, documentation by nursing staff, transition of care manager as well as nephrologist  Family Communication:  Spoke with case management who also spoke with patient's group home and apparently patient's may have a sister. We are trying to get in touch with patient's sister to help with decision making   Patient's wife is in the psych unit and according to psychiatry lacks capacity to make decision for patient.       Vitals:   10/26/22 1545 10/26/22 1600 10/26/22 1615 10/26/22 1630  BP:  (!) 147/72 (!) 147/72 (!) 154/77  Pulse: 85 90 90 89  Resp: 16 (!) 21 15 (!) 21  Temp:      TempSrc:      SpO2: 97% 95% 98% 98%  Weight:      Height:        Author: Loyce Dys, MD 10/26/2022 4:42 PM  For on call review [email or web address].

## 2022-10-26 NOTE — Progress Notes (Addendum)
Patient has been yelling and confused all morning. After confirmation from Speech therapy ,patient fed lunch by nurse. No swallowing difficulties noted. Ate 75% of lunch tray. Talks about prison and drinking at times. States he is disabled and uses a wheel chair but states he doesn't know why. 1600 Down to dialysis via bed. 1700 Wife called and stated patient acted like himself today. Singing,laughing and speaking with her.

## 2022-10-26 NOTE — Progress Notes (Signed)
Nutrition Follow Up Note   DOCUMENTATION CODES:   Not applicable  INTERVENTION:   Nepro Shake po TID, each supplement provides 425 kcal and 19 grams protein  Magic cup TID with meals, each supplement provides 290 kcal and 9 grams of protein  Rena-vit daily po daily    Pt at high refeed risk; recommend monitor potassium, magnesium and phosphorus labs daily until stable  Daily weights   Bowel regimen per MD   NUTRITION DIAGNOSIS:   Inadequate oral intake related to acute illness as evidenced by NPO status.  GOAL:   Patient will meet greater than or equal to 90% of their needs  MONITOR:   Diet advancement, Labs, Weight trends, I & O's, Skin, TF tolerance  REASON FOR ASSESSMENT:   Consult Assessment of nutrition requirement/status  ASSESSMENT:   67 y/o male with medical history significant of COPD, hypothyroidism, depression with anxiety, BPH, CKD-3A, schizoaffective disorder with bipolar, tardive dyskinesia and OSA on CPAP who is admitted with fall, PNA, sepsis, bacteremia, rhabdomyolysis, AMS and AKI.  Pt is more alert today. Pt seen by SLP and was able to be placed on a dysphagia 1/nectar thick diet. RD will add supplements and rena-vit to help pt meet his estimated needs. Pt is at high refeed risk. RD will monitor pt's oral intake. Per chart, pt's bed weights do not appear accurate as they range from 150-223lbs. RD unsure if patient has had any significant weight loss. No BM since 6/29; MD aware.   Pt with AKI requiring temporary HD starting 7/6.     Medications reviewed and include: lactulose, nicotine, protonix, Na bicarbonate   Labs reviewed: Na 133(L), K 4.4 wnl, BUN 76(H), creat 6.31(H) Hgb 7.9(L), Hct 23.2(L)- 7/7 AIC 5.7(H)- 6/30  Diet Order:   Diet Order             DIET - DYS 1 Room service appropriate? Yes with Assist; Fluid consistency: Nectar Thick  Diet effective now                  EDUCATION NEEDS:   Not appropriate for education at this  time  Skin:  Skin Assessment: Reviewed RN Assessment (Hematoma Left Neck/Shoulder, ecchymosis)  Last BM:  6/29  Height:   Ht Readings from Last 1 Encounters:  10/18/22 5\' 9"  (1.753 m)    Weight:   Wt Readings from Last 1 Encounters:  10/26/22 101.6 kg    Ideal Body Weight:  72.7 kg  BMI:  Body mass index is 33.08 kg/m.  Estimated Nutritional Needs:   Kcal:  1900-2200kcal/day  Protein:  90-105g/day  Fluid:  1.9-2.2L/day  Betsey Holiday MS, RD, LDN Please refer to [facility] for RD and/or RD on-call/weekend/after hours pager

## 2022-10-26 NOTE — Progress Notes (Signed)
Hemodialysis Note  Received patient in bed to unit. Alert and oriented. Informed consent signed and in chart.   Treatment initiated:1623 Treatment completed:1940  Patient tolerated treatment well. Transported back to the room alert, without acute distress. Report given to patient's RN.  Access used:Right Femoral Catheter  Access issues:None   Total UF removed:1.5 liters  Medications given: Heparin  Post HD ZO:[id number]  Post HD weight: Not obtained   Bartolo Darter, RN [facility]

## 2022-10-26 NOTE — TOC Progression Note (Signed)
Transition of Care [facility]) - Progression Note    Patient Details  Name: Austin Marsh MRN: [id number] Date of Birth: Oct 14, 1955  [facility]) CM/SW Contact  Darolyn Rua, [place] Phone Number: 10/26/2022, 11:59 AM  Clinical Narrative:     CSW spoke with Joy with DSS who reports she is reviewing case with supervisor and will update CSW on next steps, she reports she believes they will have to move forward with protective order for patient but pending confirmation.        Expected Discharge Plan and Services                                               Social Determinants of Health (SDOH) Interventions SDOH Screenings   Food Insecurity: No Food Insecurity (10/17/2022)  Housing: Patient Unable To Answer (10/17/2022)  Tobacco Use: High Risk (10/16/2022)    Readmission Risk Interventions    03/17/2020    1:48 PM  Readmission Risk Prevention Plan  Transportation Screening Complete  Palliative Care Screening Not Applicable  Medication Review (RN Care Manager) Complete

## 2022-10-27 ENCOUNTER — Encounter
Admission: EM | Disposition: A | Discharge status: Skilled Nursing Facility | Payer: Medicare Other | Source: Home / Self Care | Attending: Internal Medicine

## 2022-10-27 DIAGNOSIS — R6521 Severe sepsis with septic shock: Secondary | ICD-10-CM | POA: Diagnosis not present

## 2022-10-27 DIAGNOSIS — Z992 Dependence on renal dialysis: Secondary | ICD-10-CM | POA: Diagnosis not present

## 2022-10-27 DIAGNOSIS — F0390 Unspecified dementia without behavioral disturbance: Secondary | ICD-10-CM

## 2022-10-27 DIAGNOSIS — A419 Sepsis, unspecified organism: Secondary | ICD-10-CM | POA: Diagnosis not present

## 2022-10-27 DIAGNOSIS — F17210 Nicotine dependence, cigarettes, uncomplicated: Secondary | ICD-10-CM

## 2022-10-27 DIAGNOSIS — L8990 Pressure ulcer of unspecified site, unspecified stage: Secondary | ICD-10-CM | POA: Insufficient documentation

## 2022-10-27 DIAGNOSIS — N186 End stage renal disease: Secondary | ICD-10-CM

## 2022-10-27 HISTORY — PX: DIALYSIS/PERMA CATHETER INSERTION: CATH118288

## 2022-10-27 LAB — BASIC METABOLIC PANEL
Anion gap: 10 (ref 5–15)
BUN: 56 mg/dL — ABNORMAL HIGH (ref 8–23)
CO2: 30 mmol/L (ref 22–32)
Calcium: 8 mg/dL — ABNORMAL LOW (ref 8.9–10.3)
Chloride: 96 mmol/L — ABNORMAL LOW (ref 98–111)
Creatinine, Ser: 4.93 mg/dL — ABNORMAL HIGH (ref 0.61–1.24)
GFR, Estimated: 12 mL/min — ABNORMAL LOW (ref >60)
Glucose, Bld: 110 mg/dL — ABNORMAL HIGH (ref 70–99)
Potassium: 3.8 mmol/L (ref 3.5–5.1)
Sodium: 136 mmol/L (ref 135–145)

## 2022-10-27 LAB — CK: Total CK: 694 U/L — ABNORMAL HIGH (ref 49–397)

## 2022-10-27 LAB — MAGNESIUM: Magnesium: 2 mg/dL (ref 1.7–2.4)

## 2022-10-27 LAB — PHOSPHORUS: Phosphorus: 5.2 mg/dL — ABNORMAL HIGH (ref 2.5–4.6)

## 2022-10-27 SURGERY — DIALYSIS/PERMA CATHETER INSERTION
Anesthesia: Moderate Sedation

## 2022-10-27 MED ORDER — CEFAZOLIN SODIUM-DEXTROSE 1-4 GM/50ML-% IV SOLN
INTRAVENOUS | Status: AC
  Filled 2022-10-27: qty 50

## 2022-10-27 MED ORDER — DIPHENHYDRAMINE HCL 50 MG/ML IJ SOLN: 50.0000 mg | Freq: Once | INTRAMUSCULAR | Status: DC | PRN

## 2022-10-27 MED ORDER — STERILE WATER FOR INJECTION IJ SOLN
INTRAMUSCULAR | Status: AC
  Administered 2022-10-27: 10 mL
  Filled 2022-10-27: qty 10

## 2022-10-27 MED ORDER — MIDAZOLAM HCL 2 MG/2ML IJ SOLN
INTRAMUSCULAR | Status: DC | PRN
  Administered 2022-10-27: 1 mg via INTRAVENOUS

## 2022-10-27 MED ORDER — LIDOCAINE-EPINEPHRINE (PF) 1 %-1:200000 IJ SOLN
INTRAMUSCULAR | Status: DC | PRN
  Administered 2022-10-27: 20 mL

## 2022-10-27 MED ORDER — MIDAZOLAM HCL 2 MG/2ML IJ SOLN
INTRAMUSCULAR | Status: AC
  Filled 2022-10-27: qty 2

## 2022-10-27 MED ORDER — HEPARIN (PORCINE) IN NACL 1000-0.9 UT/500ML-% IV SOLN
INTRAVENOUS | Status: DC | PRN
  Administered 2022-10-27: 500 mL

## 2022-10-27 MED ORDER — SODIUM CHLORIDE 0.9 % IV SOLN: INTRAVENOUS | Status: DC

## 2022-10-27 MED ORDER — FENTANYL CITRATE PF 50 MCG/ML IJ SOSY: 12.5000 ug | PREFILLED_SYRINGE | Freq: Once | INTRAMUSCULAR | Status: DC | PRN

## 2022-10-27 MED ORDER — CEFAZOLIN SODIUM-DEXTROSE 1-4 GM/50ML-% IV SOLN
1.0000 g | INTRAVENOUS | Status: DC
  Administered 2022-10-27: 1 g via INTRAVENOUS

## 2022-10-27 MED ORDER — HEPARIN SODIUM (PORCINE) 1000 UNIT/ML IJ SOLN
INTRAMUSCULAR | Status: AC
  Filled 2022-10-27: qty 10

## 2022-10-27 MED ORDER — METHYLPREDNISOLONE SODIUM SUCC 125 MG IJ SOLR: 125.0000 mg | Freq: Once | INTRAMUSCULAR | Status: DC | PRN

## 2022-10-27 MED ORDER — HYDROMORPHONE HCL 1 MG/ML IJ SOLN: 1.0000 mg | Freq: Once | INTRAMUSCULAR | Status: DC | PRN

## 2022-10-27 MED ORDER — FENTANYL CITRATE (PF) 100 MCG/2ML IJ SOLN
INTRAMUSCULAR | Status: DC | PRN
  Administered 2022-10-27: 25 ug via INTRAVENOUS

## 2022-10-27 MED ORDER — MIDAZOLAM HCL 2 MG/ML PO SYRP
8.0000 mg | ORAL_SOLUTION | Freq: Once | ORAL | Status: DC | PRN
  Filled 2022-10-27: qty 5

## 2022-10-27 MED ORDER — FAMOTIDINE 20 MG PO TABS: 40.0000 mg | ORAL_TABLET | Freq: Once | ORAL | Status: DC | PRN

## 2022-10-27 MED ORDER — FENTANYL CITRATE (PF) 100 MCG/2ML IJ SOLN
INTRAMUSCULAR | Status: AC
  Filled 2022-10-27: qty 2

## 2022-10-27 MED ORDER — ONDANSETRON HCL 4 MG/2ML IJ SOLN: 4.0000 mg | Freq: Four times a day (QID) | INTRAMUSCULAR | Status: DC | PRN

## 2022-10-27 SURGICAL SUPPLY — 6 items
ADH SKN CLS APL DERMABOND .7 (GAUZE/BANDAGES/DRESSINGS) ×1
CATH PALIN MAXID VT KIT 19CM (CATHETERS) IMPLANT
DERMABOND ADVANCED .7 DNX12 (GAUZE/BANDAGES/DRESSINGS) IMPLANT
PACK ANGIOGRAPHY (CUSTOM PROCEDURE TRAY) IMPLANT
SUT MNCRL AB 4-0 PS2 18 (SUTURE) IMPLANT
SUT PROLENE 0 CT 1 30 (SUTURE) IMPLANT

## 2022-10-27 NOTE — Consult Note (Signed)
Hospital Consult    Reason for Consult:  Dialysis Perma Catheter Insertion Requesting Physician:  Dr Munsoor Lateef MD  MRN #:  [id number]  History of Present Illness: This is a 67 y.o. male who underwent Ultrasound-guided placement of right femoral vein temporary dialysis catheter on 10/23/2022. He has altered mental status and no legal guardian thus the catheter placement is done via emergency consent. Vascular Surgery was consulted to place a Dialysis Perma Catheter today for long term dialysis.   Past Medical History:  Diagnosis Date   COPD (chronic obstructive pulmonary disease) (HCC)    Schizoaffective disorder, bipolar type (HCC)    Sleep apnea    Tardive dyskinesia    Thyroid disease     Past Surgical History:  Procedure Laterality Date   APPENDECTOMY      Allergies  Allergen Reactions   Haldol [Haloperidol Lactate] Other (See Comments)    Tardive diskonesia   Other     [employer] cause patient to vomit   Thorazine [Chlorpromazine] Other (See Comments)    "messes me all up"   Trazodone And Nefazodone Other (See Comments)    "makes me feel like I'm dying."    Prior to Admission medications   Medication Sig Start Date End Date Taking? Authorizing Provider  acetaminophen (TYLENOL) 325 MG tablet Take 650 mg by mouth every 4 (four) hours as needed.   Yes [provider]  albuterol (VENTOLIN HFA) 108 (90 Base) MCG/ACT inhaler Inhale 2 puffs into the lungs every 4 (four) hours as needed for wheezing or shortness of breath. 01/23/20  Yes Isaacs, Cameron, MD  alum & mag hydroxide-simeth (MAALOX/MYLANTA) 200-200-20 MG/5ML suspension Take 30 mLs by mouth daily as needed for indigestion or heartburn.   Yes [provider]  benztropine (COGENTIN) 2 MG tablet Take 2 mg by mouth 2 (two) times daily.   Yes [provider]  bismuth subsalicylate (PEPTO BISMOL) 262 MG/15ML suspension Take 15 mLs by mouth every 4 (four) hours as needed.   Yes [provider]  busPIRone (BUSPAR) 15 MG tablet Take 15 mg by mouth 2 (two) times daily.   Yes [provider]  cloZAPine (CLOZARIL) 100 MG tablet Take 100-200 mg by mouth 2 (two) times daily. 100 mg qam 200 mg qpm   Yes [provider]  divalproex (DEPAKOTE ER) 500 MG 24 hr tablet Take 1,000 mg by mouth at bedtime.   Yes [provider]  doxazosin (CARDURA) 4 MG tablet Take 4 mg by mouth daily.   Yes [provider]  gabapentin (NEURONTIN) 300 MG capsule Take 300 mg by mouth 3 (three) times daily.   Yes [provider]  glycopyrrolate (ROBINUL) 1 MG tablet Take 1 mg by mouth daily.   Yes [provider]  guaiFENesin (ROBITUSSIN) 100 MG/5ML liquid Take 200 mg by mouth every 6 (six) hours as needed for cough.   Yes [provider]  levothyroxine (SYNTHROID) 100 MCG tablet Take 75 mcg by mouth daily before breakfast.   Yes [provider]  loperamide (IMODIUM) 2 MG capsule Take 2 mg by mouth as needed for diarrhea or loose stools.   Yes [provider]  magnesium hydroxide (MILK OF MAGNESIA) 400 MG/5ML suspension Take 30 mLs by mouth daily as needed for mild constipation.   Yes [provider]  meclizine (ANTIVERT) 25 MG tablet Take 25 mg by mouth 3 (three) times daily as needed for dizziness.   Yes [provider]  pantoprazole (PROTONIX) 40   MG tablet Take 40 mg by mouth daily.   Yes [provider]  sennosides-docusate sodium (SENOKOT-S) 8.6-50 MG tablet Take 1 tablet by mouth in the morning and at bedtime.   Yes [provider]  traZODone (DESYREL) 50 MG tablet Take 50 mg by mouth at bedtime.   Yes [provider]  valbenazine (INGREZZA) 80 MG capsule Take 80 mg by mouth daily.   Yes [provider]  vitamin B-12 (CYANOCOBALAMIN) 100 MCG tablet Take 100 mcg by mouth daily.   Yes [provider]  Vitamin D, Cholecalciferol, 25 MCG (1000 UT) TABS Take 50 mcg  by mouth daily.    Yes [provider]    Social History   Socioeconomic History   Marital status: Married    Spouse name: Not on file   Number of children: Not on file   Years of education: Not on file   Highest education level: Not on file  Occupational History   Not on file  Tobacco Use   Smoking status: Every Day    Types: Cigarettes   Smokeless tobacco: Never  Substance and Sexual Activity   Alcohol use: Not Currently   Drug use: Not Currently   Sexual activity: Not Currently  Other Topics Concern   Not on file  Social History Narrative   Not on file   Social Determinants of Health   Financial Resource Strain: Not on file  Food Insecurity: No Food Insecurity (10/17/2022)   Hunger Vital Sign    Worried About Running Out of Food in the Last Year: Never true    Ran Out of Food in the Last Year: Never true  Transportation Needs: Not on file  Physical Activity: Not on file  Stress: Not on file  Social Connections: Not on file  Intimate Partner Violence: Not on file     History reviewed. No pertinent family history.  ROS: Otherwise negative unless mentioned in HPI  Physical Examination  Vitals:   10/27/22 1100 10/27/22 1242  BP: (!) 146/101 128/63  Pulse: 91 82  Resp: (!) 25 14  Temp:  98.3 F (36.8 C)  SpO2: 98% 100%   Body mass index is 27.58 kg/m.  General:  WDWN in NAD Gait: Not observed HENT: WNL, normocephalic Pulmonary: normal non-labored breathing, without Rales, rhonchi,  wheezing Cardiac: regular, without  Murmurs, rubs or gallops; without carotid bruits Abdomen: Positive bowel sounds, soft, NT/ND, no masses Skin: without rashes Vascular Exam/Pulses: Palpable pulses throughout Extremities: without ischemic changes, without Gangrene , without cellulitis; without open wounds;  Musculoskeletal: no muscle wasting or atrophy  Neurologic: A&O X 3;  No focal weakness or paresthesias are detected; speech is fluent/normal Psychiatric:  The  pt has Abnormal- Hx od ETOH abuse with dementia and schizophrenia  affect. Lymph:  Unremarkable  CBC    Component Value Date/Time   WBC 6.7 10/24/2022 0615   RBC 2.56 (L) 10/24/2022 0615   HGB 7.9 (L) 10/24/2022 0615   HGB 13.0 02/21/2012 0723   HCT 23.2 (L) 10/24/2022 0615   HCT 38.5 (L) 02/21/2012 0723   PLT 72 (L) 10/24/2022 0615   PLT 178 02/21/2012 0723   MCV 90.6 10/24/2022 0615   MCV 96 02/21/2012 0723   MCH 30.9 10/24/2022 0615   MCHC 34.1 10/24/2022 0615   RDW 14.7 10/24/2022 0615   RDW 14.2 02/21/2012 0723   LYMPHSABS 1.4 10/24/2022 0615   LYMPHSABS 3.0 02/21/2012 0723   MONOABS 1.1 (H) 10/24/2022 0615   MONOABS 1.2 (  H) 02/21/2012 0723   EOSABS 0.1 10/24/2022 0615   EOSABS 0.2 02/21/2012 0723   BASOSABS 0.0 10/24/2022 0615   BASOSABS 0.1 02/21/2012 0723    BMET    Component Value Date/Time   NA 136 10/27/2022 0418   NA 148 (H) 02/20/2012 0549   K 3.8 10/27/2022 0418   K 3.6 02/20/2012 0549   CL 96 (L) 10/27/2022 0418   CL 114 (H) 02/20/2012 0549   CO2 30 10/27/2022 0418   CO2 24 02/20/2012 0549   GLUCOSE 110 (H) 10/27/2022 0418   GLUCOSE 95 02/20/2012 0549   BUN 56 (H) 10/27/2022 0418   BUN 6 (L) 02/20/2012 0549   CREATININE 4.93 (H) 10/27/2022 0418   CREATININE 0.93 02/20/2012 0549   CALCIUM 8.0 (L) 10/27/2022 0418   CALCIUM 8.2 (L) 02/20/2012 0549   GFRNONAA 12 (L) 10/27/2022 0418   GFRNONAA >60 02/20/2012 0549   GFRAA 44 (L) 06/21/2018 0143   GFRAA >60 02/20/2012 0549    COAGS: Lab Results  Component Value Date   INR 1.3 (H) 10/19/2022   INR 1.3 (H) 10/18/2022   INR 1.4 (H) 10/18/2022     Non-Invasive Vascular Imaging:   None   Statin:  No. Beta Blocker:  No. Aspirin:  No. ACEI:  No. ARB:  No. CCB use:  No Other antiplatelets/anticoagulants:  Yes.   Heparin with dialysis   ASSESSMENT/PLAN: This is a 67 y.o. male who is in need of Dialysis Perma Catheter placement. Vascular surgery has been consulted to place the catheter.  Patient has dementia and no legal guardian therefore consent is done emergently with two physician signature today. Patient requires long term dialysis for end stage renal disease.    -I discussed in detail the plan with Dr. Jason Dew MD and he is in agreement with the plan.    Makenly Larabee R [facility] 10/27/2022 1:29 PM  

## 2022-10-27 NOTE — Interval H&P Note (Signed)
History and Physical Interval Note:  10/27/2022 3:04 PM  Austin Marsh  has presented today for surgery, with the diagnosis of ESRD.  The various methods of treatment have been discussed with the patient and family. After consideration of risks, benefits and other options for treatment, the patient has consented to  Procedure(s): DIALYSIS/PERMA CATHETER INSERTION (N/A) as a surgical intervention.  The patient's history has been reviewed, patient examined, no change in status, stable for surgery.  I have reviewed the patient's chart and labs.  Questions were answered to the patient's satisfaction.     Festus Barren

## 2022-10-27 NOTE — Op Note (Signed)
OPERATIVE NOTE    PRE-OPERATIVE DIAGNOSIS: 1. ESRD   POST-OPERATIVE DIAGNOSIS: same as above  PROCEDURE: Ultrasound guidance for vascular access to the right internal jugular vein Fluoroscopic guidance for placement of catheter Placement of a 19 cm tip to cuff tunneled hemodialysis catheter via the right internal jugular vein  SURGEON: Festus Barren, MD  ANESTHESIA:  Local with Moderate conscious sedation for approximately 18 minutes using 1 mg of Versed and 25 mcg of Fentanyl  ESTIMATED BLOOD LOSS: 15 cc  FLUORO TIME: less than one minute  CONTRAST: none  FINDING(S): 1.  Patent right internal jugular vein  SPECIMEN(S):  None  INDICATIONS:   Austin Marsh is a 67 y.o.male who presents with renal failure.  The patient needs long term dialysis access for their ESRD, and a Permcath is necessary.  Risks and benefits are discussed and informed consent is obtained.    DESCRIPTION: After obtaining full informed written consent, the patient was brought back to the vascular suited. The patient's right neck and chest were sterilely prepped and draped in a sterile surgical field was created. Moderate conscious sedation was administered during a face to face encounter with the patient throughout the procedure with my supervision of the RN administering medicines and monitoring the patient's vital signs, pulse oximetry, telemetry and mental status throughout from the start of the procedure until the patient was taken to the recovery room.  The right internal jugular vein was visualized with ultrasound and found to be patent. It was then accessed under direct ultrasound guidance and a permanent image was recorded. A wire was placed. After skin nick and dilatation, the peel-away sheath was placed over the wire. I then turned my attention to an area under the clavicle. Approximately 1-2 fingerbreadths below the clavicle a small counterincision was created and tunneled from the subclavicular incision to  the access site. Using fluoroscopic guidance, a 19 centimeter tip to cuff tunneled hemodialysis catheter was selected, and tunneled from the subclavicular incision to the access site. It was then placed through the peel-away sheath and the peel-away sheath was removed. Using fluoroscopic guidance the catheter tips were parked in the right atrium. The appropriate distal connectors were placed. It withdrew blood well and flushed easily with heparinized saline and a concentrated heparin solution was then placed. It was secured to the chest wall with 2 Prolene sutures. The access incision was closed single 4-0 Monocryl. A 4-0 Monocryl pursestring suture was placed around the exit site. Sterile dressings were placed. The patient tolerated the procedure well and was taken to the recovery room in stable condition.  COMPLICATIONS: None  CONDITION: Stable  Festus Barren, MD 10/27/2022 3:48 PM   This note was created with Dragon Medical transcription system. Any errors in dictation are purely unintentional.

## 2022-10-27 NOTE — Progress Notes (Signed)
SLP Cancellation Note  Patient Details Name: Austin Marsh MRN: [id number] DOB: [birth year]   Cancelled treatment:       Reason Eval/Treat Not Completed: Fatigue/lethargy limiting ability to participate (pt post procedure this PM) ST services will f/u w/ toleration of diet and trials to upgrade diet tomorrow as appropriate. NSG agreed.      Jerilynn Som, MS, CCC-SLP Speech Language Pathologist Rehab Services; [facility] [phone] (ascom) Gitty Osterlund 10/27/2022, 5:32 PM

## 2022-10-27 NOTE — H&P (View-Only) (Signed)
Hospital Consult    Reason for Consult:  Dialysis Perma Catheter Insertion Requesting Physician:  Dr Mady Haagensen MD  MRN #:  [id number]  History of Present Illness: This is a 66 y.o. male who underwent Ultrasound-guided placement of right femoral vein temporary dialysis catheter on 10/23/2022. He has altered mental status and no legal guardian thus the catheter placement is done via emergency consent. Vascular Surgery was consulted to place a Dialysis Perma Catheter today for long term dialysis.   Past Medical History:  Diagnosis Date   COPD (chronic obstructive pulmonary disease) (HCC)    Schizoaffective disorder, bipolar type (HCC)    Sleep apnea    Tardive dyskinesia    Thyroid disease     Past Surgical History:  Procedure Laterality Date   APPENDECTOMY      Allergies  Allergen Reactions   Haldol [Haloperidol Lactate] Other (See Comments)    Tardive diskonesia   Other     [employer] cause patient to vomit   Thorazine [Chlorpromazine] Other (See Comments)    "messes me all up"   Trazodone And Nefazodone Other (See Comments)    "makes me feel like I'm dying."    Prior to Admission medications   Medication Sig Start Date End Date Taking? Authorizing Provider  acetaminophen (TYLENOL) 325 MG tablet Take 650 mg by mouth every 4 (four) hours as needed.   Yes [provider]  albuterol (VENTOLIN HFA) 108 (90 Base) MCG/ACT inhaler Inhale 2 puffs into the lungs every 4 (four) hours as needed for wheezing or shortness of breath. 01/23/20  Yes Shaune Pollack, MD  alum & mag hydroxide-simeth (MAALOX/MYLANTA) 200-200-20 MG/5ML suspension Take 30 mLs by mouth daily as needed for indigestion or heartburn.   Yes [provider]  benztropine (COGENTIN) 2 MG tablet Take 2 mg by mouth 2 (two) times daily.   Yes [provider]  bismuth subsalicylate (PEPTO BISMOL) 262 MG/15ML suspension Take 15 mLs by mouth every 4 (four) hours as needed.   Yes [provider]  busPIRone (BUSPAR) 15 MG tablet Take 15 mg by mouth 2 (two) times daily.   Yes [provider]  cloZAPine (CLOZARIL) 100 MG tablet Take 100-200 mg by mouth 2 (two) times daily. 100 mg qam 200 mg qpm   Yes [provider]  divalproex (DEPAKOTE ER) 500 MG 24 hr tablet Take 1,000 mg by mouth at bedtime.   Yes [provider]  doxazosin (CARDURA) 4 MG tablet Take 4 mg by mouth daily.   Yes [provider]  gabapentin (NEURONTIN) 300 MG capsule Take 300 mg by mouth 3 (three) times daily.   Yes [provider]  glycopyrrolate (ROBINUL) 1 MG tablet Take 1 mg by mouth daily.   Yes [provider]  guaiFENesin (ROBITUSSIN) 100 MG/5ML liquid Take 200 mg by mouth every 6 (six) hours as needed for cough.   Yes [provider]  levothyroxine (SYNTHROID) 100 MCG tablet Take 75 mcg by mouth daily before breakfast.   Yes [provider]  loperamide (IMODIUM) 2 MG capsule Take 2 mg by mouth as needed for diarrhea or loose stools.   Yes [provider]  magnesium hydroxide (MILK OF MAGNESIA) 400 MG/5ML suspension Take 30 mLs by mouth daily as needed for mild constipation.   Yes [provider]  meclizine (ANTIVERT) 25 MG tablet Take 25 mg by mouth 3 (three) times daily as needed for dizziness.   Yes [provider]  pantoprazole (PROTONIX) 40  MG tablet Take 40 mg by mouth daily.   Yes [provider]  sennosides-docusate sodium (SENOKOT-S) 8.6-50 MG tablet Take 1 tablet by mouth in the morning and at bedtime.   Yes [provider]  traZODone (DESYREL) 50 MG tablet Take 50 mg by mouth at bedtime.   Yes [provider]  valbenazine (INGREZZA) 80 MG capsule Take 80 mg by mouth daily.   Yes [provider]  vitamin B-12 (CYANOCOBALAMIN) 100 MCG tablet Take 100 mcg by mouth daily.   Yes [provider]  Vitamin D, Cholecalciferol, 25 MCG (1000 UT) TABS Take 50 mcg  by mouth daily.    Yes [provider]    Social History   Socioeconomic History   Marital status: Married    Spouse name: Not on file   Number of children: Not on file   Years of education: Not on file   Highest education level: Not on file  Occupational History   Not on file  Tobacco Use   Smoking status: Every Day    Types: Cigarettes   Smokeless tobacco: Never  Substance and Sexual Activity   Alcohol use: Not Currently   Drug use: Not Currently   Sexual activity: Not Currently  Other Topics Concern   Not on file  Social History Narrative   Not on file   Social Determinants of Health   Financial Resource Strain: Not on file  Food Insecurity: No Food Insecurity (10/17/2022)   Hunger Vital Sign    Worried About Running Out of Food in the Last Year: Never true    Ran Out of Food in the Last Year: Never true  Transportation Needs: Not on file  Physical Activity: Not on file  Stress: Not on file  Social Connections: Not on file  Intimate Partner Violence: Not on file     History reviewed. No pertinent family history.  ROS: Otherwise negative unless mentioned in HPI  Physical Examination  Vitals:   10/27/22 1100 10/27/22 1242  BP: (!) 146/101 128/63  Pulse: 91 82  Resp: (!) 25 14  Temp:  98.3 F (36.8 C)  SpO2: 98% 100%   Body mass index is 27.58 kg/m.  General:  WDWN in NAD Gait: Not observed HENT: WNL, normocephalic Pulmonary: normal non-labored breathing, without Rales, rhonchi,  wheezing Cardiac: regular, without  Murmurs, rubs or gallops; without carotid bruits Abdomen: Positive bowel sounds, soft, NT/ND, no masses Skin: without rashes Vascular Exam/Pulses: Palpable pulses throughout Extremities: without ischemic changes, without Gangrene , without cellulitis; without open wounds;  Musculoskeletal: no muscle wasting or atrophy  Neurologic: A&O X 3;  No focal weakness or paresthesias are detected; speech is fluent/normal Psychiatric:  The  pt has Abnormal- Hx od ETOH abuse with dementia and schizophrenia  affect. Lymph:  Unremarkable  CBC    Component Value Date/Time   WBC 6.7 10/24/2022 0615   RBC 2.56 (L) 10/24/2022 0615   HGB 7.9 (L) 10/24/2022 0615   HGB 13.0 02/21/2012 0723   HCT 23.2 (L) 10/24/2022 0615   HCT 38.5 (L) 02/21/2012 0723   PLT 72 (L) 10/24/2022 0615   PLT 178 02/21/2012 0723   MCV 90.6 10/24/2022 0615   MCV 96 02/21/2012 0723   MCH 30.9 10/24/2022 0615   MCHC 34.1 10/24/2022 0615   RDW 14.7 10/24/2022 0615   RDW 14.2 02/21/2012 0723   LYMPHSABS 1.4 10/24/2022 0615   LYMPHSABS 3.0 02/21/2012 0723   MONOABS 1.1 (H) 10/24/2022 0615   MONOABS 1.2 (  H) 02/21/2012 0723   EOSABS 0.1 10/24/2022 0615   EOSABS 0.2 02/21/2012 0723   BASOSABS 0.0 10/24/2022 0615   BASOSABS 0.1 02/21/2012 0723    BMET    Component Value Date/Time   NA 136 10/27/2022 0418   NA 148 (H) 02/20/2012 0549   K 3.8 10/27/2022 0418   K 3.6 02/20/2012 0549   CL 96 (L) 10/27/2022 0418   CL 114 (H) 02/20/2012 0549   CO2 30 10/27/2022 0418   CO2 24 02/20/2012 0549   GLUCOSE 110 (H) 10/27/2022 0418   GLUCOSE 95 02/20/2012 0549   BUN 56 (H) 10/27/2022 0418   BUN 6 (L) 02/20/2012 0549   CREATININE 4.93 (H) 10/27/2022 0418   CREATININE 0.93 02/20/2012 0549   CALCIUM 8.0 (L) 10/27/2022 0418   CALCIUM 8.2 (L) 02/20/2012 0549   GFRNONAA 12 (L) 10/27/2022 0418   GFRNONAA >60 02/20/2012 0549   GFRAA 44 (L) 06/21/2018 0143   GFRAA >60 02/20/2012 0549    COAGS: Lab Results  Component Value Date   INR 1.3 (H) 10/19/2022   INR 1.3 (H) 10/18/2022   INR 1.4 (H) 10/18/2022     Non-Invasive Vascular Imaging:   None   Statin:  No. Beta Blocker:  No. Aspirin:  No. ACEI:  No. ARB:  No. CCB use:  No Other antiplatelets/anticoagulants:  Yes.   Heparin with dialysis   ASSESSMENT/PLAN: This is a 67 y.o. male who is in need of Dialysis Perma Catheter placement. Vascular surgery has been consulted to place the catheter.  Patient has dementia and no legal guardian therefore consent is done emergently with two physician signature today. Patient requires long term dialysis for end stage renal disease.    -I discussed in detail the plan with Dr. Festus Barren MD and he is in agreement with the plan.    Marcie Bal [facility] 10/27/2022 1:29 PM

## 2022-10-27 NOTE — Progress Notes (Signed)
Progress Note   Patient: Austin Marsh ZOX:[id number] DOB: Sep 08, 1955 DOA: 10/16/2022     10 DOS: the patient was seen and examined on 10/27/2022      Brief hospital course: "Austin Marsh is a 67 y.o. male with medical history significant of COPD, hypothyroidism, depression with anxiety, BPH, CKD-3A, schizoaffective disorder with bipolar, tardive dyskinesia, OSA on CPAP, who presents with fall and AMS."  Caregiver at family care facility.  reported pt was in his normal health condition until 10:30 PM yesterday when pt had fall and was found on the floor. At normal baseline, patient is alert oriented x 3 but today is confused.  On admission, pt was lethargic, oriented to person and place, not to year or situation.  He reported right hip pain with moving right leg, low back pain, some chest pain which he was unable to characterize further, constipation, suprapubic pain.  He was found to have acute urinary retention (973 cc on bladder scan).  Foley catheter was placed.     In the ED - fever of 101, HR 123, RR 26, stable O2 sat on room air.  Per EMS report, patient had low BP, but at time of admission BP was stable at 124/73.   Chest x-ray negative.   CT of head negative for acute intracranial abnormalities.   CT Chest/Abd/Pelvis: Moderate coronary artery calcification. No acute intrathoracic or intra-abdominal pathology identified. Stable 9 mm right adrenal adenoma. Stable changes of adrenal hyperplasia. Marked distention of the bladder possibly reflecting changes of bladder outlet obstruction. Mild prostatic hypertrophy.  Probable post TURP changes.  X-ray of bilateral hips/pelvis negative for fracture.   X-ray of L-spine and T-spine negative for acute fracture, did showed degenerative disc disease    Notable labs - WBC 11.2, severe AKI with Cr 7.78, sodium 123, negative COVID PCR, lactic acid> 9.0 ---> 4.9, troponin level 172, 406, 804, 784.  Sodium 123, magnesium 2.3,  Hemoglobin 11.5.   Pt was  admitted to PCU. Subsequently found to have severe rhabdomyolysis with CK peaking above 50k. IV heparin was started and cardiology consulted due to troponin elevation.   7/1 -- pt hypotensive, requiring IV fluid boluses.  Worsening encephalopathy, Hbg dropped to 6.3, large area of ecchymosis and swelling in the left neck & upper shoulder concerning for hematoma, later confirmed on CT check and neck soft tissue.  Pt was transferred to stepdown for developing shock. PCCM consulted.  Transfused 2 units pRBC's, 1 FFP.   7/2 -- BP's improved after 3 units pRBC's, 1 FFP total.  Hbg improved from nadir 5.4 to 10.1 >> 9.3 this AM.  +Blood cultures with GPC's and GPR's.  ID consulted.  CK trending down. Nephrology consulted with worsening renal function and poor urine output reported.   7/3-7/8 patient's renal function continues to worsen and she continues to remain altered nephrologist made decision for hemodialysis initiation.  Patient's wife is in the psych unit and according to psychiatry lacks capacity to make decision for patient.   Assessment and Plan:     Acute kidney injury (HCC)-worsening Stage 3a CKD: Baseline Cr 1.17 on 06/04/2022.  Cr on admission 7.78 AKI likely due to severe rhabdomyolysis, sepsis, dehydration, urinary retention secondary to bladder outlet obstruction. Case discussed with nephrology team Avoid nephrotoxic medications Sodium bicarb drip discontinued Had dialysis catheter placement on 10/23/2022 Hemodialysis was initiated on patient on 10/23/2022  Able to engage in some conversation today but still confused We will continue to optimize renal function We will continue pursuing  any additional family members for discussion of goals of care     * Septic shock (HCC)-improved On admission, tachycardic, tachypneic, febrile. Lactic acid was above 9 meeting septic shock criteria (with dehydration, AKI, rhabdo contributing).  Hypotension seemed more due to acute blood loss 2/2 left  neck hematoma.  BP responded to fluids, blood products and midodrine, did not require pressors. Has bacteremia and RLL pneumonia suspect acute aspiration on 7/1 while encephalopathic  Patient completed a course of Zyvox and currently antibiotics have been discontinued by infectious disease specialist we appreciate input Continue to monitor closely Infectious disease on board   Acute respiratory failure with hypoxia (HCC) Right lower lobe pneumonia, not POA, suspect acute aspiration event on 7/1. Antibiotics completed and discontinued by ID on 10/22/2022 Currently requiring 2 L of intranasal oxygen Wean off as tolerated   Bacteremia due to Staphylococcus Initial blood cx +staph epidermidis felt contaminant.  Has completed antibiotics and discontinued by ID on 10/22/2022 Infectious disease on board we appreciate input   Hematoma-resolved Hematoma Left Neck/Shoulder  Acute Blood Loss Anemia Hypotension due to above and ?septic shock 7/1 - Hbg dropped from 11.5 >> 6.3 and pt noted to have new swelling and ecchymosis at the left neck & upper shoulder.  --Heparin was stopped  --Transfused total 3 units pRBC's and 1 FFP --Transferred to stepdown 7/1 --PCCM consulted for pressors if needed - signed off, BP's stabilized Continue to monitor CBC closely   Rhabdomyolysis-improved CK peaked > 50000 Given aggressive IV hydration for this and hypotension, then devoped hypoxia on 7/1, concern for pulmonary edema but found to have RLL collapse.  Fluid held as patient requiring dialysis now CPK downtrending   Acute metabolic encephalopathy CT head on admission negative.  Multifactorial due to metabolic derangements, sepsis / bacteremia, underlying psych conditions Resume home psychiatric meds once safe to take PO Continue delirium precautions -As needed Ativan for severe agitation otherwise we will avoid benzodiazepine use     Acute retention of urine Patient has underlying BPH Foley placed on  admission. Blood pressure improved however patient unable to take his oral Cardura Resume Proscar when able to take oral meds Outpatient follow-up with urologist if unable to pass voiding trial when medically ready    NSTEMI (non-ST elevated myocardial infarction) (HCC) Elevated troponin - demand ischemia & delayed clearance with severe AKI. -Cardiology was consulted, signed off --IV heparin stopped 7/1 AM with acute anemia and hematoma Continue to monitor closely   Fall Unknown cause, pt was found on the floor at his family care home, per caregiver's history on admission. --PT OT evaluation when clinically improved --Imaging was negative for acute injuries / fractures --Neck hematoma developed 7/1 Confirmed on CT chest and neck soft tissue obtained later on 7/1 PM   COPD (chronic obstructive pulmonary disease) (HCC) Stable Continue PRN bronchodilators and Mucinex   Hyponatremia-improving Sodium 124 on 10/21/2022 Saline fluids stopped due to hypoxia on 7/1 Monitor sodium level closely Nephrology following   Hypothyroidism Continue Synthroid     Schizoaffective disorder, bipolar type (HCC) Resume home PO meds when able Allergy listed to haldol, will use IV Ativan PRN if agitation   Depression with anxiety -Resume home Benztropine, BuSpar, clozapine, Depakote, Ingrezza when able to take PO meds   Tobacco abuse Nicotine patch ordered       Subjective / Interval history Pt seen in ICU this AM.  He is awake and talkative, but confused.  Per bedside RN, pt given Imodium for diarrhea after aggressive  bowel regimen.  He got ativan around 3 AM for agitation.  Pt asking for his harmonica in his pants pocket (wearing hospital gown).  Starts singing a religious song.  Tells me his wife's name, his home address for family care home.      Disposition: Patient still meets criteria for inpatient management as he continues to require hemodialysis and IV therapies    Planned  Discharge Destination: Anticipate will need SNF due to prolonged admission.  Unable to get PT/OT evaluations with temporary femoral dialysis catheter in place     Physical examination:   General exam: awake, talkative, no acute distress HEENT: left neck and upper shoulder ecchymosis, moist mucus membranes, hearing grossly normal  Respiratory system: CTAB, no wheezes, rales or rhonchi, normal respiratory effort. Cardiovascular system: normal S1/S2, RRR, trace pedal edema.   Gastrointestinal system: soft, NT, ND, no HSM felt, +bowel sounds. Central nervous system: A&O x self at least. no gross focal neurologic deficits, normal speech Extremities: mittens on hands, SCD's on legs, trace pedal edema Skin: dry, intact, normal temperature Psychiatry: normal mood, congruent affect       Data Reviewed: Notable labs --- Cl 96, glucose 110, BUN 56, Cr 4.93, Ca 8.0, Phos 5.2.  CK improved 694    Family Communication: None  Per Dr. Meriam Sprague 10/26/22: "Spoke with case management who also spoke with patient's group home and apparently patient's may have a sister.  We are trying to get in touch with patient's sister to help with decision making   Patient's wife is in the psych unit and according to psychiatry lacks capacity to make decision for patient."         Author: Pennie Banter, DO 10/27/2022 2:35 PM  For on call review [email or web address].

## 2022-10-27 NOTE — Plan of Care (Signed)
Patient returned from dialysis. All VSS once placed back on monitor. Patient oriented to self only, states that he is in a mental hospital. Foley catheter remains in place. Will continue to monitor.   Problem: Education: Goal: Knowledge of General Education information will improve Description: Including pain rating scale, medication(s)/side effects and non-pharmacologic comfort measures Outcome: Progressing   Problem: Health Behavior/Discharge Planning: Goal: Ability to manage health-related needs will improve Outcome: Progressing   Problem: Clinical Measurements: Goal: Ability to maintain clinical measurements within normal limits will improve Outcome: Progressing Goal: Will remain free from infection Outcome: Progressing Goal: Diagnostic test results will improve Outcome: Progressing Goal: Respiratory complications will improve Outcome: Progressing Goal: Cardiovascular complication will be avoided Outcome: Progressing   Problem: Activity: Goal: Risk for activity intolerance will decrease Outcome: Progressing   Problem: Nutrition: Goal: Adequate nutrition will be maintained Outcome: Progressing   Problem: Coping: Goal: Level of anxiety will decrease Outcome: Progressing   Problem: Elimination: Goal: Will not experience complications related to bowel motility Outcome: Progressing Goal: Will not experience complications related to urinary retention Outcome: Progressing   Problem: Pain Managment: Goal: General experience of comfort will improve Outcome: Progressing   Problem: Safety: Goal: Ability to remain free from injury will improve Outcome: Progressing   Problem: Skin Integrity: Goal: Risk for impaired skin integrity will decrease Outcome: Progressing   Problem: Fluid Volume: Goal: Hemodynamic stability will improve Outcome: Progressing   Problem: Clinical Measurements: Goal: Diagnostic test results will improve Outcome: Progressing Goal: Signs and  symptoms of infection will decrease Outcome: Progressing   Problem: Respiratory: Goal: Ability to maintain adequate ventilation will improve Outcome: Progressing

## 2022-10-27 NOTE — Progress Notes (Signed)
[facility]  ROUNDING NOTE   Subjective:   Patient seen and evaluated at bedside. Underwent third dialysis treatment yesterday. Urine output yesterday was only 330 cc.   Objective:  Vital signs in last 24 hours:  Temp:  [97.7 F (36.5 C)-98.3 F (36.8 C)] 98.3 F (36.8 C) (07/10 1242) Pulse Rate:  [82-94] 82 (07/10 1242) Resp:  [13-25] 14 (07/10 1242) BP: (123-167)/(63-101) 128/63 (07/10 1242) SpO2:  [95 %-100 %] 100 % (07/10 1242) Weight:  [84.7 kg] 84.7 kg (07/10 0700)  Weight change: -16.9 kg Filed Weights   10/25/22 0444 10/26/22 0500 10/27/22 0700  Weight: 86.4 kg 101.6 kg 84.7 kg    Intake/Output: I/O last 3 completed shifts: In: 670 [P.O.:285; I.V.:385] Out: 2030 [Urine:530; Other:1500]   Intake/Output this shift:  Total I/O In: -  Out: 100 [Urine:100]  Physical Exam: General: No acute distress  Head: Normocephalic, atraumatic. Dry oral mucosal membranes  Eyes: Anicteric  Lungs:  Scattered rhonchi, normal effort  Heart: Regular rate and rhythm  Abdomen:  Soft, nontender  Extremities: 2+ generalized edema.  Neurologic: Awake, alert  Skin: Weeping edema  Access: Right femoral temporary dialysis catheter  Foley in place  Basic Metabolic Panel: Recent Labs  Lab 10/23/22 0521 10/24/22 0615 10/25/22 0546 10/26/22 0419 10/27/22 0418  NA 125* 130* 130* 133* 136  K 5.3* 5.0 5.1 4.4 3.8  CL 83* 88* 86* 90* 96*  CO2 27 29 28 27 30   GLUCOSE 78 86 77 78 110*  BUN 91* 80* 100* 76* 56*  CREATININE 9.04* 7.81* 9.13* 6.31* 4.93*  CALCIUM 7.1* 7.0* 7.2* 7.3* 8.0*  MG  --   --   --   --  2.0  PHOS  --   --   --   --  5.2*     Liver Function Tests: Recent Labs  Lab 10/21/22 0405  AST 278*  ALT 95*  ALKPHOS 39  BILITOT 1.0  PROT 4.4*  ALBUMIN 1.9*    No results for input(s): "LIPASE", "AMYLASE" in the last 168 hours.  No results for input(s): "AMMONIA" in the last 168 hours.  CBC: Recent Labs  Lab 10/21/22 0405 10/21/22 0800  10/21/22 1351 10/22/22 0409 10/23/22 0521 10/24/22 0615  WBC 10.8*  --   --  10.2 7.4 6.7  NEUTROABS  --   --   --  8.3* 4.7 4.0  HGB 8.6* 8.6* 8.7* 9.0* 7.5* 7.9*  HCT 24.3* 24.1* 24.6* 25.7* 21.3* 23.2*  MCV 87.4  --   --  87.7 87.7 90.6  PLT 69*  --   --  71* 70* 72*     Cardiac Enzymes: Recent Labs  Lab 10/23/22 0521 10/24/22 0615 10/25/22 0546 10/26/22 0419 10/27/22 0418  CKTOTAL 1,354* 909* 830* 881* 694*     BNP: Invalid input(s): "POCBNP"  CBG: No results for input(s): "GLUCAP" in the last 168 hours.   Microbiology: Results for orders placed or performed during the hospital encounter of 10/16/22  Blood Culture (routine x 2)     Status: None (Preliminary result)   Collection Time: 10/16/22 11:44 PM   Specimen: BLOOD LEFT ARM  Result Value Ref Range Status   Specimen Description   Final    BLOOD LEFT ARM Performed at [facility], [address], [place], [place] [address]    Special Requests   Final    BOTTLES DRAWN AEROBIC AND ANAEROBIC Blood Culture adequate volume Performed at [facility], [address] [place], [place]  [address]    Culture  Setup Time   Final    GRAM POSTIVE ORGANISM AEROBIC BOTTLE ONLY CRITICAL VALUE NOTED.  VALUE IS CONSISTENT WITH PREVIOUSLY REPORTED AND CALLED VALUE.    Culture   Final    GRAM POSITIVE RODS SENT TO [facility] FOR ID Performed at [facility], [address] [place], [place] [address]    Report Status PENDING  Incomplete  Blood Culture (routine x 2)     Status: Abnormal (Preliminary result)   Collection Time: 10/16/22 11:44 PM   Specimen: BLOOD RIGHT ARM  Result Value Ref Range Status   Specimen Description   Final    BLOOD RIGHT ARM Performed at [facility], [address] [place], [place] [address]    Special Requests   Final    BOTTLES DRAWN AEROBIC AND ANAEROBIC Blood Culture adequate volume Performed at [facility], [address]  [place], [place] [address]    Culture  Setup Time   Final    GRAM POSITIVE COCCI ANAEROBIC BOTTLE ONLY CRITICAL RESULT CALLED TO, READ BACK BY AND VERIFIED WITH: NATHAN BELUE @ 2253 10/17/22 [facility] GRAM POSITIVE RODS AEROBIC BOTTLE ONLY CRITICAL RESULT CALLED TO, READ BACK BY AND VERIFIED WITH: JASON ROBBINS@0310  10/19/22 [facility]    Culture (A)  Final    STAPHYLOCOCCUS EPIDERMIDIS THE SIGNIFICANCE OF ISOLATING THIS ORGANISM FROM A SINGLE SET OF BLOOD CULTURES WHEN MULTIPLE SETS ARE DRAWN IS UNCERTAIN. PLEASE NOTIFY THE MICROBIOLOGY DEPARTMENT WITHIN ONE WEEK IF SPECIATION AND SENSITIVITIES ARE REQUIRED. GRAM POSITIVE RODS SENT TO [facility] FOR ID AND SUSCEPTIBILITIES Performed at [facility], [address], [place], [place] [address]    Report Status PENDING  Incomplete  SARS Coronavirus 2 by RT PCR (hospital order, performed in [facility] hospital lab) *cepheid single result test* Anterior Nasal Swab     Status: None   Collection Time: 10/16/22 11:44 PM   Specimen: Anterior Nasal Swab  Result Value Ref Range Status   SARS Coronavirus 2 by RT PCR NEGATIVE NEGATIVE Final    Comment: (NOTE) SARS-CoV-2 target nucleic acids are NOT DETECTED.  The SARS-CoV-2 RNA is generally detectable in upper and lower respiratory specimens during the acute phase of infection. The lowest concentration of SARS-CoV-2 viral copies this assay can detect is 250 copies / mL. A negative result does not preclude SARS-CoV-2 infection and should not be used as the sole basis for treatment or other patient management decisions.  A negative result may occur with improper specimen collection / handling, submission of specimen other than nasopharyngeal swab, presence of viral mutation(s) within the areas targeted by this assay, and inadequate number of viral copies (<250 copies / mL). A negative result must be combined with clinical observations, patient history, and epidemiological information.  Fact Sheet for Patients:    RoadLapTop.co.za  Fact Sheet for Healthcare Providers: http://kim-miller.com/  This test is not yet approved or  cleared by the [place] FDA and has been authorized for detection and/or diagnosis of SARS-CoV-2 by FDA under an Emergency Use Authorization (EUA).  This EUA will remain in effect (meaning this test can be used) for the duration of the COVID-19 declaration under Section 564(b)(1) of the Act, 21 U.S.C. section 360bbb-3(b)(1), unless the authorization is terminated or revoked sooner.  Performed at [facility], [address], [place], [place] [address]   Blood Culture ID Panel (Reflexed)     Status: Abnormal   Collection Time: 10/16/22 11:44 PM  Result  Value Ref Range Status   Enterococcus faecalis NOT DETECTED NOT DETECTED Final   Enterococcus Faecium NOT DETECTED NOT DETECTED Final   Listeria monocytogenes NOT DETECTED NOT DETECTED Final   Staphylococcus species DETECTED (A) NOT DETECTED Final    Comment: CRITICAL RESULT CALLED TO, READ BACK BY AND VERIFIED WITH: NATHAN BELUE @ 2253 10/17/22 [facility]    Staphylococcus aureus (BCID) NOT DETECTED NOT DETECTED Final   Staphylococcus epidermidis DETECTED (A) NOT DETECTED Final    Comment: Methicillin (oxacillin) resistant coagulase negative staphylococcus. Possible blood culture contaminant (unless isolated from more than one blood culture draw or clinical case suggests pathogenicity). No antibiotic treatment is indicated for blood  culture contaminants. CRITICAL RESULT CALLED TO, READ BACK BY AND VERIFIED WITH: NATHAN BELUE @ 2253 10/17/22 [facility]    Staphylococcus lugdunensis NOT DETECTED NOT DETECTED Final   Streptococcus species NOT DETECTED NOT DETECTED Final   Streptococcus agalactiae NOT DETECTED NOT DETECTED Final   Streptococcus pneumoniae NOT DETECTED NOT DETECTED Final   Streptococcus pyogenes NOT DETECTED NOT DETECTED Final   A.calcoaceticus-baumannii NOT  DETECTED NOT DETECTED Final   Bacteroides fragilis NOT DETECTED NOT DETECTED Final   Enterobacterales NOT DETECTED NOT DETECTED Final   Enterobacter cloacae complex NOT DETECTED NOT DETECTED Final   Escherichia coli NOT DETECTED NOT DETECTED Final   Klebsiella aerogenes NOT DETECTED NOT DETECTED Final   Klebsiella oxytoca NOT DETECTED NOT DETECTED Final   Klebsiella pneumoniae NOT DETECTED NOT DETECTED Final   Proteus species NOT DETECTED NOT DETECTED Final   Salmonella species NOT DETECTED NOT DETECTED Final   Serratia marcescens NOT DETECTED NOT DETECTED Final   Haemophilus influenzae NOT DETECTED NOT DETECTED Final   Neisseria meningitidis NOT DETECTED NOT DETECTED Final   Pseudomonas aeruginosa NOT DETECTED NOT DETECTED Final   Stenotrophomonas maltophilia NOT DETECTED NOT DETECTED Final   Candida albicans NOT DETECTED NOT DETECTED Final   Candida auris NOT DETECTED NOT DETECTED Final   Candida glabrata NOT DETECTED NOT DETECTED Final   Candida krusei NOT DETECTED NOT DETECTED Final   Candida parapsilosis NOT DETECTED NOT DETECTED Final   Candida tropicalis NOT DETECTED NOT DETECTED Final   Cryptococcus neoformans/gattii NOT DETECTED NOT DETECTED Final   Methicillin resistance mecA/C DETECTED (A) NOT DETECTED Final    Comment: CRITICAL RESULT CALLED TO, READ BACK BY AND VERIFIED WITHHarrold Donath BELUE @ 2253 10/17/22 [facility] Performed at [facility], [address] [place], [place] [address]   Respiratory (~20 pathogens) panel by PCR     Status: None   Collection Time: 10/17/22  9:26 AM   Specimen: Nasopharyngeal Swab; Respiratory  Result Value Ref Range Status   Adenovirus NOT DETECTED NOT DETECTED Final   Coronavirus 229E NOT DETECTED NOT DETECTED Final    Comment: (NOTE) The Coronavirus on the Respiratory Panel, DOES NOT test for the novel  Coronavirus (2019 nCoV)    Coronavirus HKU1 NOT DETECTED NOT DETECTED Final   Coronavirus NL63 NOT DETECTED NOT DETECTED Final    Coronavirus OC43 NOT DETECTED NOT DETECTED Final   Metapneumovirus NOT DETECTED NOT DETECTED Final   Rhinovirus / Enterovirus NOT DETECTED NOT DETECTED Final   Influenza A NOT DETECTED NOT DETECTED Final   Influenza B NOT DETECTED NOT DETECTED Final   Parainfluenza Virus 1 NOT DETECTED NOT DETECTED Final   Parainfluenza Virus 2 NOT DETECTED NOT DETECTED Final   Parainfluenza Virus 3 NOT DETECTED NOT DETECTED Final   Parainfluenza Virus 4 NOT DETECTED NOT DETECTED Final   Respiratory  Syncytial Virus NOT DETECTED NOT DETECTED Final   Bordetella pertussis NOT DETECTED NOT DETECTED Final   Bordetella Parapertussis NOT DETECTED NOT DETECTED Final   Chlamydophila pneumoniae NOT DETECTED NOT DETECTED Final   Mycoplasma pneumoniae NOT DETECTED NOT DETECTED Final    Comment: Performed at [facility], [address], [place], [place] [address]  MRSA Next Gen by PCR, Nasal     Status: None   Collection Time: 10/18/22 10:53 AM   Specimen: Nasal Mucosa; Nasal Swab  Result Value Ref Range Status   MRSA by PCR Next Gen NOT DETECTED NOT DETECTED Final    Comment: (NOTE) The GeneXpert MRSA Assay (FDA approved for NASAL specimens only), is one component of a comprehensive MRSA colonization surveillance program. It is not intended to diagnose MRSA infection nor to guide or monitor treatment for MRSA infections. Test performance is not FDA approved in patients less than 54 years old. Performed at [facility], [address] [place], [place] [address]     Coagulation Studies: No results for input(s): "LABPROT", "INR" in the last 72 hours.   Urinalysis: No results for input(s): "COLORURINE", "LABSPEC", "PHURINE", "GLUCOSEU", "HGBUR", "BILIRUBINUR", "KETONESUR", "PROTEINUR", "UROBILINOGEN", "NITRITE", "LEUKOCYTESUR" in the last 72 hours.  Invalid input(s): "APPERANCEUR"    Imaging: No results found.   Medications:    anticoagulant sodium citrate      Chlorhexidine  Gluconate Cloth  6 each Topical Daily   feeding supplement (NEPRO CARB STEADY)  237 mL Oral TID BM   lactulose  30 g Oral QHS   lidocaine  10 mL Intradermal Once   multivitamin  1 tablet Oral QHS   nicotine  21 mg Transdermal Daily   mouth rinse  15 mL Mouth Rinse 4 times per day   pantoprazole (PROTONIX) IV  40 mg Intravenous Q24H   acetaminophen **OR** acetaminophen, albuterol, alteplase, alum & mag hydroxide-simeth, anticoagulant sodium citrate, bismuth subsalicylate, guaiFENesin, heparin, lidocaine (PF), lidocaine-prilocaine, loperamide, LORazepam, magnesium hydroxide, magnesium hydroxide, meclizine, methocarbamol, morphine injection, ondansetron **OR** ondansetron (ZOFRAN) IV, mouth rinse, oxyCODONE-acetaminophen, pentafluoroprop-tetrafluoroeth, polyethylene glycol, sodium chloride  Assessment/ Plan:  Mr. Austin Marsh is a 67 y.o.  male with past medical history of hypothyroidism, depression with anxiety, schizoaffective, and COPD, who was admitted to [facility] on 10/16/2022 for Lactic acidosis [E87.20] Elevated troponin level [R79.89] Demand ischemia [I24.89] NSTEMI (non-ST elevated myocardial infarction) (HCC) [I21.4] Acute kidney injury (HCC) [N17.9] Septic shock (HCC) [A41.9, R65.21] Sepsis due to undetermined organism (HCC) [A41.9] Altered mental status, unspecified altered mental status type [R41.82] SIRS (systemic inflammatory response syndrome) (HCC) [R65.10]   Acute kidney injury with hyponatremia likely secondary to rhabdomyolysis.    -Creatinine continues to trend down with ongoing dialysis treatments.  Remains oliguric with urine output of only 330 cc over the preceding 24 hours.  Will need ongoing dialysis treatments.  Consider switching temporary dialysis catheter to PermCath.   Lab Results  Component Value Date   CREATININE 4.93 (H) 10/27/2022   CREATININE 6.31 (H) 10/26/2022   CREATININE 9.13 (H) 10/25/2022    Intake/Output Summary (Last 24 hours) at 10/27/2022  1321 Last data filed at 10/27/2022 1200 Gross per 24 hour  Intake 45 ml  Output 1900 ml  Net -1855 ml    2. Anemia of acute blood loss:  Lab Results  Component Value Date   HGB 7.9 (L) 10/24/2022  Continue to monitor CBC periodically.  3. Acute respiratory failure requiring HFNC, right lower lobe pneumonia noted with positive blood culture, bacteremia.   -  Respiratory failure overall improved as compared to admission.  Continue to monitor respiratory status.      LOS: 10 Asharia Lotter 7/10/20241:21 PM

## 2022-10-27 NOTE — TOC Progression Note (Signed)
Transition of Care [facility]) - Progression Note    Patient Details  Name: Austin Marsh MRN: [id number] Date of Birth: 1955/11/03  Transition of [facility]) CM/SW Contact  Darolyn Rua, [place] Phone Number: 10/27/2022, 9:31 AM  Clinical Narrative:     CSW has reached out to Joy with DSS to inquire if she has staffed case with supervisor to determine if they will pursue protective order and guardianship or if step daughter can be decision maker. Pending response at this time.        Expected Discharge Plan and Services                                               Social Determinants of Health (SDOH) Interventions SDOH Screenings   Food Insecurity: No Food Insecurity (10/17/2022)  Housing: Patient Unable To Answer (10/17/2022)  Tobacco Use: High Risk (10/16/2022)    Readmission Risk Interventions    03/17/2020    1:48 PM  Readmission Risk Prevention Plan  Transportation Screening Complete  Palliative Care Screening Not Applicable  Medication Review (RN Care Manager) Complete

## 2022-10-28 ENCOUNTER — Encounter: Payer: Self-pay | Admitting: Vascular Surgery

## 2022-10-28 DIAGNOSIS — F250 Schizoaffective disorder, bipolar type: Secondary | ICD-10-CM | POA: Diagnosis not present

## 2022-10-28 DIAGNOSIS — R6521 Severe sepsis with septic shock: Secondary | ICD-10-CM | POA: Diagnosis not present

## 2022-10-28 DIAGNOSIS — W19XXXD Unspecified fall, subsequent encounter: Secondary | ICD-10-CM

## 2022-10-28 DIAGNOSIS — N179 Acute kidney failure, unspecified: Secondary | ICD-10-CM | POA: Diagnosis not present

## 2022-10-28 DIAGNOSIS — M6282 Rhabdomyolysis: Secondary | ICD-10-CM | POA: Diagnosis not present

## 2022-10-28 DIAGNOSIS — A419 Sepsis, unspecified organism: Secondary | ICD-10-CM | POA: Diagnosis not present

## 2022-10-28 LAB — CBC
HCT: 21.4 % — ABNORMAL LOW (ref 39.0–52.0)
Hemoglobin: 7.1 g/dL — ABNORMAL LOW (ref 13.0–17.0)
MCH: 31.3 pg (ref 26.0–34.0)
MCHC: 33.2 g/dL (ref 30.0–36.0)
MCV: 94.3 fL (ref 80.0–100.0)
Platelets: 82 10*3/uL — ABNORMAL LOW (ref 150–400)
RBC: 2.27 MIL/uL — ABNORMAL LOW (ref 4.22–5.81)
RDW: 15 % (ref 11.5–15.5)
WBC: 9.2 10*3/uL (ref 4.0–10.5)
nRBC: 0 % (ref 0.0–0.2)

## 2022-10-28 LAB — CK: Total CK: 415 U/L — ABNORMAL HIGH (ref 49–397)

## 2022-10-28 LAB — BASIC METABOLIC PANEL
Anion gap: 10 (ref 5–15)
BUN: 73 mg/dL — ABNORMAL HIGH (ref 8–23)
CO2: 31 mmol/L (ref 22–32)
Calcium: 8.4 mg/dL — ABNORMAL LOW (ref 8.9–10.3)
Chloride: 98 mmol/L (ref 98–111)
Creatinine, Ser: 5.97 mg/dL — ABNORMAL HIGH (ref 0.61–1.24)
GFR, Estimated: 10 mL/min — ABNORMAL LOW (ref >60)
Glucose, Bld: 124 mg/dL — ABNORMAL HIGH (ref 70–99)
Potassium: 4.1 mmol/L (ref 3.5–5.1)
Sodium: 139 mmol/L (ref 135–145)

## 2022-10-28 LAB — MAGNESIUM: Magnesium: 2.1 mg/dL (ref 1.7–2.4)

## 2022-10-28 LAB — PHOSPHORUS: Phosphorus: 5.8 mg/dL — ABNORMAL HIGH (ref 2.5–4.6)

## 2022-10-28 MED ORDER — BENZTROPINE MESYLATE 1 MG PO TABS
2.0000 mg | ORAL_TABLET | Freq: Two times a day (BID) | ORAL | Status: DC
  Administered 2022-10-28 – 2022-11-04 (×14): 2 mg via ORAL
  Filled 2022-10-28 (×5): qty 2
  Filled 2022-10-28 (×3): qty 4
  Filled 2022-10-28 (×2): qty 2
  Filled 2022-10-28 (×2): qty 4
  Filled 2022-10-28 (×3): qty 2

## 2022-10-28 MED ORDER — DOXAZOSIN MESYLATE 4 MG PO TABS
4.0000 mg | ORAL_TABLET | Freq: Every day | ORAL | Status: DC
  Administered 2022-10-28 – 2022-11-04 (×8): 4 mg via ORAL
  Filled 2022-10-28 (×8): qty 1

## 2022-10-28 MED ORDER — BUSPIRONE HCL 10 MG PO TABS
15.0000 mg | ORAL_TABLET | Freq: Two times a day (BID) | ORAL | Status: DC
  Administered 2022-10-28 – 2022-11-04 (×14): 15 mg via ORAL
  Filled 2022-10-28 (×14): qty 2

## 2022-10-28 MED ORDER — TRAZODONE HCL 50 MG PO TABS
50.0000 mg | ORAL_TABLET | Freq: Every day | ORAL | Status: DC
  Administered 2022-10-28 – 2022-11-03 (×7): 50 mg via ORAL
  Filled 2022-10-28 (×7): qty 1

## 2022-10-28 MED ORDER — CLOZAPINE 25 MG PO TABS
12.5000 mg | ORAL_TABLET | Freq: Two times a day (BID) | ORAL | Status: DC
  Administered 2022-10-28 – 2022-10-30 (×4): 12.5 mg via ORAL
  Filled 2022-10-28 (×4): qty 1

## 2022-10-28 MED ORDER — VALBENAZINE TOSYLATE 40 MG PO CAPS
80.0000 mg | ORAL_CAPSULE | Freq: Every day | ORAL | Status: DC
  Administered 2022-10-28 – 2022-11-04 (×8): 80 mg via ORAL
  Filled 2022-10-28 (×8): qty 2

## 2022-10-28 MED ORDER — LEVOTHYROXINE SODIUM 50 MCG PO TABS
75.0000 ug | ORAL_TABLET | Freq: Every day | ORAL | Status: DC
  Administered 2022-10-29 – 2022-11-04 (×7): 75 ug via ORAL
  Filled 2022-10-28 (×7): qty 2

## 2022-10-28 MED ORDER — ACETAMINOPHEN 325 MG PO TABS
ORAL_TABLET | ORAL | Status: AC
  Filled 2022-10-28: qty 2

## 2022-10-28 MED ORDER — DIVALPROEX SODIUM ER 500 MG PO TB24
1000.0000 mg | ORAL_TABLET | Freq: Every day | ORAL | Status: DC
  Administered 2022-10-28 – 2022-11-03 (×7): 1000 mg via ORAL
  Filled 2022-10-28 (×7): qty 2

## 2022-10-28 MED ORDER — CLOZAPINE 100 MG PO TABS: 100.0000 mg | ORAL_TABLET | Freq: Two times a day (BID) | ORAL | Status: DC

## 2022-10-28 MED ORDER — PANTOPRAZOLE SODIUM 40 MG PO TBEC
40.0000 mg | DELAYED_RELEASE_TABLET | Freq: Every day | ORAL | Status: DC
  Administered 2022-10-29 – 2022-11-04 (×6): 40 mg via ORAL
  Filled 2022-10-28 (×6): qty 1

## 2022-10-28 NOTE — Progress Notes (Signed)
   10/28/22 1600  Spiritual Encounters  Type of Visit Follow up  Care provided to: Patient  Reason for visit Routine spiritual support  OnCall Visit No  Spiritual Framework  Presenting Themes Rituals and practive  Family Stress Factors Not reviewed  Interventions  Spiritual Care Interventions Made Compassionate presence;Prayer  Spiritual Care Plan  Spiritual Care Issues Still Outstanding No further spiritual care needs at this time (see row info)   Request was put in for prayer. I pray for the patient once then he ask for me to pray for him again. So I prayer and left patient room after a brief conversations.

## 2022-10-28 NOTE — Plan of Care (Signed)
  Problem: Clinical Measurements: Goal: Ability to maintain clinical measurements within normal limits will improve Outcome: Progressing Goal: Will remain free from infection Outcome: Progressing Goal: Cardiovascular complication will be avoided Outcome: Progressing   Problem: Pain Managment: Goal: General experience of comfort will improve Outcome: Progressing   Problem: Safety: Goal: Ability to remain free from injury will improve Outcome: Progressing   

## 2022-10-28 NOTE — Progress Notes (Signed)
Speech Language Pathology Treatment: Dysphagia  Patient Details Name: Austin Marsh MRN: [id number] DOB: 04-Dec-1955 Today's Date: 10/28/2022 Time: 1191-4782 SLP Time Calculation (min) (ACUTE ONLY): 40 min  Assessment / Plan / Recommendation Clinical Impression  Pt seen today for diet upgrade and education on general aspiration precautions. Wife and Home NSG care arrived during session. Pt was alert, talkative and eager to feed self po trials offered. Home NSG care stated pt was at his Baseline in his Cognitive-communication abilities this PM.   Pt appears to present w/ grossly functional oropharyngeal phase swallowing though min extra Time and effort noted w/ increased textured foods during the oral phase in setting of Edentulous status; pt also presents w/ declined Cognitive-communication functioning in general. ANY Cognitive decline can impact overall awareness/timing of swallow and safety during po tasks which increases risk for aspiration, choking thus Pulmonary decline.      He does present today w/ overall improvement in Cognitive attention/focus for po intake/tasks, AND he presented w/ improved attention and oropharyngeal swallowing of trials assessed stating when he needed to "take a break" or needed a "sip of liquid first". Pt fed self w/ setup support and Supervision given.   Pt was presented w/ oral care then trials of MINCED solids, moistened then thin liquids via Cup. He was instructed, and often followed through w/, general aspiration precautions of SMALL sips and bites. No immediate, overt pharyngeal phase swallow deficits were appreciated; no decline in vocal quality, no immediate cough, no decline in O2 sats 99%. Oral phase was c/b in increased oral attention and mashing/gumming time d/t Edentulous status -- pt exhibited functional bolus management and timely A-P transfer to swallow; he licked/swept bolus residue on mustache w/ lingual movements. He consumed ~10 trials of minced foods;  4 ozs of thin liquids. He sat fully upright in bed w/ full/free head/neck movements.         In setting of improved oropharyngeal swallowing and attention during po intake w/ diet, recommend an upgrade to Dysphagia level 2 (MINCED) foods d/t Edentulous status w/ Thin liquids via CUP. Aspiration precautions and 100% Supervision and Feeding support at meals as needed. Engage pt in self-feeding and holding Cup to drink -- NO Straws. Pills CRUSHED vs Whole in Puree.  ST services recommends f/u at next venue of care as pt continues to improve and return to his functional baseline w/ eating/meals in light of Edentulous status. MD/NSG updated, agreed. Precautions posted in room. Dietician following.     HPI HPI: Pt is a 67 y.o. male with medical history significant of COPD, hypothyroidism, depression with anxiety, BPH, CKD-3A, schizoaffective disorder with bipolar, tardive dyskinesia, OSA on CPAP, who presents with fall and AMS.  Pt resides at a [facility].  Per care giver, at normal baseline, patient is alert oriented x 3.  Upon admit to the ED, the pt was lethargic and confused. Pt admited w/ SIRS and rhabdomyolysis w/ hyponatremia.  He required BIPAP in ICU -- NSG reported concern he may have aspirated secretions/phelgm d/t his thick secretions/congestion.    CT Head: no acute abnormaility.  CT Chest: Debris in the right lower lobe bronchus (series  3/image 34), new from recent prior, with new right lower lobe  atelectasis/collapse. This appearance favors mucous plugging.      SLP Plan  Continue with current plan of care;All goals met      Recommendations for follow up therapy are one component of a multi-disciplinary discharge planning process, led by the attending physician.  Recommendations may be updated based on patient status, additional functional criteria and insurance authorization.    Recommendations  Diet recommendations: Dysphagia 2 (fine chop);Thin liquid Liquids provided via:  Cup;No straw Medication Administration: Crushed with puree (vs Whole in puree if able w/ NSG) Supervision: Patient able to self feed;Staff to assist with self feeding;Full supervision/cueing for compensatory strategies Compensations: Minimize environmental distractions;Slow rate;Small sips/bites;Lingual sweep for clearance of pocketing;Follow solids with liquid Postural Changes and/or Swallow Maneuvers: Out of bed for meals;Seated upright 90 degrees;Upright 30-60 min after meal                 (Rehab following) Oral care BID;Oral care before and after PO;Staff/trained caregiver to provide oral care   Frequent or constant Supervision/Assistance Dysphagia, unspecified (R13.10) (Edentulous; Cognitive decline)     Continue with current plan of care;All goals met       Jerilynn Som, MS, CCC-SLP Speech Language Pathologist Rehab Services; [facility] [phone] (ascom) Rosea Dory  10/28/2022, 6:28 PM

## 2022-10-28 NOTE — Progress Notes (Signed)
Progress Note   Patient: Austin Marsh JXB:[id number] DOB: April 03, 1956 DOA: 10/16/2022     11 DOS: the patient was seen and examined on 10/28/2022      Brief hospital course: "Shravan Salahuddin is a 67 y.o. male with medical history significant of COPD, hypothyroidism, depression with anxiety, BPH, CKD-3A, schizoaffective disorder with bipolar, tardive dyskinesia, OSA on CPAP, who presents with fall and AMS."  Caregiver at family care facility.  reported pt was in his normal health condition until 10:30 PM yesterday when pt had fall and was found on the floor. At normal baseline, patient is alert oriented x 3 but today is confused.  On admission, pt was lethargic, oriented to person and place, not to year or situation.  He reported right hip pain with moving right leg, low back pain, some chest pain which he was unable to characterize further, constipation, suprapubic pain.  He was found to have acute urinary retention (973 cc on bladder scan).  Foley catheter was placed.     In the ED - fever of 101, HR 123, RR 26, stable O2 sat on room air.  Per EMS report, patient had low BP, but at time of admission BP was stable at 124/73.   Chest x-ray negative.   CT of head negative for acute intracranial abnormalities.   CT Chest/Abd/Pelvis: Moderate coronary artery calcification. No acute intrathoracic or intra-abdominal pathology identified. Stable 9 mm right adrenal adenoma. Stable changes of adrenal hyperplasia. Marked distention of the bladder possibly reflecting changes of bladder outlet obstruction. Mild prostatic hypertrophy.  Probable post TURP changes.  X-ray of bilateral hips/pelvis negative for fracture.   X-ray of L-spine and T-spine negative for acute fracture, did showed degenerative disc disease    Notable labs - WBC 11.2, severe AKI with Cr 7.78, sodium 123, negative COVID PCR, lactic acid> 9.0 ---> 4.9, troponin level 172, 406, 804, 784.  Sodium 123, magnesium 2.3,  Hemoglobin 11.5.   Pt was  admitted to PCU. Subsequently found to have severe rhabdomyolysis with CK peaking above 50k. IV heparin was started and cardiology consulted due to troponin elevation.   7/1 -- pt hypotensive, requiring IV fluid boluses.  Worsening encephalopathy, Hbg dropped to 6.3, large area of ecchymosis and swelling in the left neck & upper shoulder concerning for hematoma, later confirmed on CT check and neck soft tissue.  Pt was transferred to stepdown for developing shock. PCCM consulted.  Transfused 2 units pRBC's, 1 FFP.   7/2 -- BP's improved after 3 units pRBC's, 1 FFP total.  Hbg improved from nadir 5.4 to 10.1 >> 9.3 this AM.  +Blood cultures with GPC's and GPR's.  ID consulted.  CK trending down. Nephrology consulted with worsening renal function and poor urine output reported.   7/3-7/8 patient's renal function continues to worsen and she continues to remain altered nephrologist made decision for hemodialysis initiation.  Patient's wife is in the psych unit and according to psychiatry lacks capacity to make decision for patient.   Assessment and Plan:     Acute kidney injury (HCC)-worsening Stage 3a CKD: Baseline Cr 1.17 on 06/04/2022.  Cr on admission 7.78 AKI likely due to severe rhabdomyolysis, sepsis, dehydration, urinary retention secondary to bladder outlet obstruction. Case discussed with nephrology team Avoid nephrotoxic medications Sodium bicarb drip discontinued Had dialysis catheter placement on 10/23/2022 Hemodialysis was initiated on patient on 10/23/2022 R internal jugular Permcath placed 7/10 Able to engage in some conversation today but still confused We will continue pursuing any additional  family members for discussion of goals of care     * Septic shock (HCC)-improved On admission, tachycardic, tachypneic, febrile. Lactic acid was above 9 meeting septic shock criteria (with dehydration, AKI, rhabdo contributing).  Hypotension seemed more due to acute blood loss 2/2 left neck  hematoma.  BP responded to fluids, blood products and midodrine, did not require pressors. Has bacteremia and RLL pneumonia suspect acute aspiration on 7/1 while encephalopathic  Patient completed a course of Zyvox. Antibiotics discontinued by infectious disease. Continue to monitor closely Infectious disease on board   Acute respiratory failure with hypoxia (HCC) Right lower lobe pneumonia, not POA, suspect acute aspiration event on 7/1. Antibiotics completed and discontinued by ID on 10/22/2022 Currently requiring 2 L of intranasal oxygen Wean off as tolerated   Bacteremia due to Staphylococcus Initial blood cx +staph epidermidis felt contaminant.  Has completed antibiotics and discontinued by ID on 10/22/2022 Infectious disease on board we appreciate input   Hematoma-resolved Hematoma Left Neck/Shoulder  Acute Blood Loss Anemia Hypotension due to above and ?septic shock 7/1 - Hbg dropped from 11.5 >> 6.3 and pt noted to have new swelling and ecchymosis at the left neck & upper shoulder.  --Heparin was stopped  --Transfused total 3 units pRBC's and 1 FFP --Transferred to stepdown 7/1 --PCCM consulted for pressors if needed - signed off, BP's stabilized Continue to monitor CBC closely   Rhabdomyolysis-improved CK peaked > 50000 Given aggressive IV hydration for this and hypotension, then devoped hypoxia on 7/1, concern for pulmonary edema but found to have RLL collapse.  Fluid held as patient requiring dialysis now CK downtrending, 415   Acute metabolic encephalopathy CT head on admission negative.  Multifactorial due to metabolic derangements, sepsis / bacteremia, underlying psych conditions Home psych meds resumed  now that he can take PO meds Continue delirium precautions -As needed Ativan for severe agitation otherwise we will avoid benzodiazepine use     Acute retention of urine Patient has underlying BPH Foley placed on admission. Blood pressure improved however patient  unable to take his oral Cardura Resume doxazosin  Outpatient follow-up with urologist if unable to pass voiding trial when medically ready    NSTEMI (non-ST elevated myocardial infarction) (HCC) Elevated troponin - demand ischemia & delayed clearance with severe AKI. -Cardiology was consulted, signed off --IV heparin stopped 7/1 AM with acute anemia and hematoma Continue to monitor closely   Fall Unknown cause, pt was found on the floor at his family care home, per caregiver's history on admission. --PT OT evaluation when clinically improved --Imaging was negative for acute injuries / fractures --Neck hematoma developed 7/1 Confirmed on CT chest and neck soft tissue obtained later on 7/1 PM   COPD (chronic obstructive pulmonary disease) (HCC) Stable Continue PRN bronchodilators and Mucinex   Hyponatremia-improving Sodium 124 on 10/21/2022 Saline fluids stopped due to hypoxia on 7/1 Monitor sodium level closely Nephrology following    Hypothyroidism Continue Synthroid     Schizoaffective disorder, bipolar type (HCC) Depression with anxiety -Resume home Benztropine, BuSpar, clozapine, Depakote, Ingrezza (held while unable to take PO meds) --Allergy listed to haldol, will use IV Ativan PRN if agitation   Tobacco abuse Nicotine patch ordered       Subjective / Interval history Pt seen in dialysis today.  Reports he feels well.  He hold up his hands and said "I need my mittens".  States hands are cold and asking to have mittens put back on.  Says he's feeling better.  Disposition: Anticipate will need SNF due to prolonged admission.  Status is: Inpatient Remains inpatient appropriate because: continues to require hemodialysis and IV therapies      Physical examination:   General exam: awake, talkative, no acute distress, confused HEENT: left neck and upper shoulder ecchymosis stable, moist mucus membranes, hearing grossly normal  Respiratory system: CTAB, no  wheezes, rales or rhonchi, normal respiratory effort. Cardiovascular system:R internal jugular dialysis catheter accessed, RRR, trace pedal edema.   Gastrointestinal system: soft, NT, ND, no HSM felt, +bowel sounds. Central nervous system: A&O x self at least. no gross focal neurologic deficits, normal speech, follow commands Extremities: SCD's on legs, trace pedal edema Skin: dry, intact, normal temperature Psychiatry: normal mood, congruent affect, confused       Data Reviewed: Notable labs --- glucose 124, BUN 73, Cr 5.97, Ca 8.4, Phos 5.8.  CK improved 415    Family Communication: None  Per Dr. Meriam Sprague 10/26/22: "Spoke with case management who also spoke with patient's group home and apparently patient's may have a sister.  We are trying to get in touch with patient's sister to help with decision making   Patient's wife is in the psych unit and according to psychiatry lacks capacity to make decision for patient."         Author: Pennie Banter, DO 10/28/2022 1:42 PM  For on call review [email or web address].

## 2022-10-28 NOTE — TOC Progression Note (Signed)
Transition of Care [facility]) - Progression Note    Patient Details  Name: Austin Marsh MRN: [id number] Date of Birth: July 02, 1955  Transition of Care [facility]) CM/SW Contact  Darolyn Rua, [place] Phone Number: 10/28/2022, 9:34 AM  Clinical Narrative:     CSW received call from Joy with DSS who reports Toniann Fail with DSS will be visiting patient at bedside to assess today.   Toniann Fail and Ander Slade will follow up with TOC on plan following assessment.        Expected Discharge Plan and Services                                               Social Determinants of Health (SDOH) Interventions SDOH Screenings   Food Insecurity: No Food Insecurity (10/17/2022)  Housing: Patient Unable To Answer (10/17/2022)  Tobacco Use: High Risk (10/16/2022)    Readmission Risk Interventions    03/17/2020    1:48 PM  Readmission Risk Prevention Plan  Transportation Screening Complete  PCP or Specialist Appt within 3-5 Days --  HRI or Home Care Consult --  Palliative Care Screening Not Applicable  Medication Review (RN Care Manager) Complete

## 2022-10-28 NOTE — Progress Notes (Signed)
OT Cancellation Note  Patient Details Name: Austin Marsh MRN: [id number] DOB: 05/25/1955   Cancelled Treatment:    Reason Eval/Treat Not Completed: Other (comment) (pt is OTF at HD, OT will reattampt as able.Oleta Mouse, OTD OTR/L  10/28/22, 9:25 AM

## 2022-10-28 NOTE — Plan of Care (Signed)

## 2022-10-28 NOTE — Progress Notes (Signed)
[facility]  ROUNDING NOTE   Subjective:   Patient seen and evaluated during dialysis   HEMODIALYSIS FLOWSHEET:  Blood Flow Rate (mL/min): 400 mL/min Arterial Pressure (mmHg): -130 mmHg Venous Pressure (mmHg): 160 mmHg TMP (mmHg): 40 mmHg Ultrafiltration Rate (mL/min): 601 mL/min Dialysate Flow Rate (mL/min): 300 ml/min Dialysis Fluid Bolus: Normal Saline  Tolerating treatment well   Objective:  Vital signs in last 24 hours:  Temp:  [97.5 F (36.4 C)-98.3 F (36.8 C)] 97.8 F (36.6 C) (07/11 1300) Pulse Rate:  [0-96] 90 (07/11 1300) Resp:  [13-30] 13 (07/11 1300) BP: (116-164)/(64-88) 123/81 (07/11 1300) SpO2:  [86 %-100 %] 98 % (07/11 1300) Weight:  [85.2 kg-97.7 kg] 97.7 kg (07/11 0907)  Weight change: 0.5 kg Filed Weights   10/27/22 0700 10/28/22 0500 10/28/22 0907  Weight: 84.7 kg 85.2 kg 97.7 kg    Intake/Output: I/O last 3 completed shifts: In: 45 [P.O.:45] Out: 1900 [Urine:400; Other:1500]   Intake/Output this shift:  Total I/O In: 480 [P.O.:480] Out: 1500 [Other:1500]  Physical Exam: General: No acute distress  Head: Normocephalic, atraumatic. Dry oral mucosal membranes  Eyes: Anicteric  Lungs:  Scattered rhonchi, normal effort  Heart: Regular rate and rhythm  Abdomen:  Soft, nontender  Extremities: 2+ generalized edema.  Neurologic: Awake, alert  Skin: Weeping edema  Access: Right femoral temporary dialysis catheter, Rt chest permcath  Foley in place  Basic Metabolic Panel: Recent Labs  Lab 10/24/22 0615 10/25/22 0546 10/26/22 0419 10/27/22 0418 10/28/22 0408  NA 130* 130* 133* 136 139  K 5.0 5.1 4.4 3.8 4.1  CL 88* 86* 90* 96* 98  CO2 29 28 27 30 31   GLUCOSE 86 77 78 110* 124*  BUN 80* 100* 76* 56* 73*  CREATININE 7.81* 9.13* 6.31* 4.93* 5.97*  CALCIUM 7.0* 7.2* 7.3* 8.0* 8.4*  MG  --   --   --  2.0 2.1  PHOS  --   --   --  5.2* 5.8*    Liver Function Tests: No results for input(s): "AST", "ALT", "ALKPHOS",  "BILITOT", "PROT", "ALBUMIN" in the last 168 hours. No results for input(s): "LIPASE", "AMYLASE" in the last 168 hours.  No results for input(s): "AMMONIA" in the last 168 hours.  CBC: Recent Labs  Lab 10/21/22 1351 10/22/22 0409 10/23/22 0521 10/24/22 0615 10/28/22 0935  WBC  --  10.2 7.4 6.7 9.2  NEUTROABS  --  8.3* 4.7 4.0  --   HGB 8.7* 9.0* 7.5* 7.9* 7.1*  HCT 24.6* 25.7* 21.3* 23.2* 21.4*  MCV  --  87.7 87.7 90.6 94.3  PLT  --  71* 70* 72* 82*    Cardiac Enzymes: Recent Labs  Lab 10/24/22 0615 10/25/22 0546 10/26/22 0419 10/27/22 0418 10/28/22 0408  CKTOTAL 909* 830* 881* 694* 415*    BNP: Invalid input(s): "POCBNP"  CBG: No results for input(s): "GLUCAP" in the last 168 hours.   Microbiology: Results for orders placed or performed during the hospital encounter of 10/16/22  Blood Culture (routine x 2)     Status: None (Preliminary result)   Collection Time: 10/16/22 11:44 PM   Specimen: BLOOD LEFT ARM  Result Value Ref Range Status   Specimen Description   Final    BLOOD LEFT ARM Performed at [facility], [address] [place], [place] [address]    Special Requests   Final    BOTTLES DRAWN AEROBIC AND ANAEROBIC Blood Culture adequate volume Performed at [facility], [address] [place],  [place] [address]    Culture  Setup Time   Final    GRAM POSTIVE ORGANISM AEROBIC BOTTLE ONLY CRITICAL VALUE NOTED.  VALUE IS CONSISTENT WITH PREVIOUSLY REPORTED AND CALLED VALUE.    Culture   Final    GRAM POSITIVE RODS SENT TO [facility] FOR ID Performed at [facility], [address], [place], [place] [address]    Report Status PENDING  Incomplete  Blood Culture (routine x 2)     Status: Abnormal (Preliminary result)   Collection Time: 10/16/22 11:44 PM   Specimen: BLOOD RIGHT ARM  Result Value Ref Range Status   Specimen Description   Final    BLOOD RIGHT ARM Performed at [facility], [address],  [place], [place] [address]    Special Requests   Final    BOTTLES DRAWN AEROBIC AND ANAEROBIC Blood Culture adequate volume Performed at [facility], [address], [place], [place] [address]    Culture  Setup Time   Final    GRAM POSITIVE COCCI ANAEROBIC BOTTLE ONLY CRITICAL RESULT CALLED TO, READ BACK BY AND VERIFIED WITH: NATHAN BELUE @ 2253 10/17/22 [facility] GRAM POSITIVE RODS AEROBIC BOTTLE ONLY CRITICAL RESULT CALLED TO, READ BACK BY AND VERIFIED WITH: JASON ROBBINS@0310  10/19/22 [facility]    Culture (A)  Final    STAPHYLOCOCCUS EPIDERMIDIS THE SIGNIFICANCE OF ISOLATING THIS ORGANISM FROM A SINGLE SET OF BLOOD CULTURES WHEN MULTIPLE SETS ARE DRAWN IS UNCERTAIN. PLEASE NOTIFY THE MICROBIOLOGY DEPARTMENT WITHIN ONE WEEK IF SPECIATION AND SENSITIVITIES ARE REQUIRED. GRAM POSITIVE RODS SENT TO [facility] FOR ID AND SUSCEPTIBILITIES Performed at [facility], [address] [place], [place] [address]    Report Status PENDING  Incomplete  SARS Coronavirus 2 by RT PCR (hospital order, performed in [facility] hospital lab) *cepheid single result test* Anterior Nasal Swab     Status: None   Collection Time: 10/16/22 11:44 PM   Specimen: Anterior Nasal Swab  Result Value Ref Range Status   SARS Coronavirus 2 by RT PCR NEGATIVE NEGATIVE Final    Comment: (NOTE) SARS-CoV-2 target nucleic acids are NOT DETECTED.  The SARS-CoV-2 RNA is generally detectable in upper and lower respiratory specimens during the acute phase of infection. The lowest concentration of SARS-CoV-2 viral copies this assay can detect is 250 copies / mL. A negative result does not preclude SARS-CoV-2 infection and should not be used as the sole basis for treatment or other patient management decisions.  A negative result may occur with improper specimen collection / handling, submission of specimen other than nasopharyngeal swab, presence of viral mutation(s) within the areas targeted by this assay, and inadequate  number of viral copies (<250 copies / mL). A negative result must be combined with clinical observations, patient history, and epidemiological information.  Fact Sheet for Patients:   RoadLapTop.co.za  Fact Sheet for Healthcare Providers: http://kim-miller.com/  This test is not yet approved or  cleared by the [place] FDA and has been authorized for detection and/or diagnosis of SARS-CoV-2 by FDA under an Emergency Use Authorization (EUA).  This EUA will remain in effect (meaning this test can be used) for the duration of the COVID-19 declaration under Section 564(b)(1) of the Act, 21 U.S.C. section 360bbb-3(b)(1), unless the authorization is terminated or revoked sooner.  Performed at [facility], [address] [place], [place] [address]   Blood Culture ID Panel (Reflexed)     Status: Abnormal   Collection Time: 10/16/22 11:44 PM  Result Value Ref Range Status   Enterococcus faecalis NOT DETECTED NOT DETECTED Final   Enterococcus Faecium NOT DETECTED NOT DETECTED Final   Listeria monocytogenes NOT DETECTED NOT DETECTED Final   Staphylococcus species DETECTED (A) NOT DETECTED Final    Comment: CRITICAL RESULT CALLED TO, READ BACK BY AND VERIFIED WITH: NATHAN BELUE @ 2253 10/17/22 [facility]    Staphylococcus aureus (BCID) NOT DETECTED NOT DETECTED Final   Staphylococcus epidermidis DETECTED (A) NOT DETECTED Final    Comment: Methicillin (oxacillin) resistant coagulase negative staphylococcus. Possible blood culture contaminant (unless isolated from more than one blood culture draw or clinical case suggests pathogenicity). No antibiotic treatment is indicated for blood  culture contaminants. CRITICAL RESULT CALLED TO, READ BACK BY AND VERIFIED WITH: NATHAN BELUE @ 2253 10/17/22 [facility]    Staphylococcus lugdunensis NOT DETECTED NOT DETECTED Final   Streptococcus species NOT DETECTED NOT DETECTED Final   Streptococcus agalactiae  NOT DETECTED NOT DETECTED Final   Streptococcus pneumoniae NOT DETECTED NOT DETECTED Final   Streptococcus pyogenes NOT DETECTED NOT DETECTED Final   A.calcoaceticus-baumannii NOT DETECTED NOT DETECTED Final   Bacteroides fragilis NOT DETECTED NOT DETECTED Final   Enterobacterales NOT DETECTED NOT DETECTED Final   Enterobacter cloacae complex NOT DETECTED NOT DETECTED Final   Escherichia coli NOT DETECTED NOT DETECTED Final   Klebsiella aerogenes NOT DETECTED NOT DETECTED Final   Klebsiella oxytoca NOT DETECTED NOT DETECTED Final   Klebsiella pneumoniae NOT DETECTED NOT DETECTED Final   Proteus species NOT DETECTED NOT DETECTED Final   Salmonella species NOT DETECTED NOT DETECTED Final   Serratia marcescens NOT DETECTED NOT DETECTED Final   Haemophilus influenzae NOT DETECTED NOT DETECTED Final   Neisseria meningitidis NOT DETECTED NOT DETECTED Final   Pseudomonas aeruginosa NOT DETECTED NOT DETECTED Final   Stenotrophomonas maltophilia NOT DETECTED NOT DETECTED Final   Candida albicans NOT DETECTED NOT DETECTED Final   Candida auris NOT DETECTED NOT DETECTED Final   Candida glabrata NOT DETECTED NOT DETECTED Final   Candida krusei NOT DETECTED NOT DETECTED Final   Candida parapsilosis NOT DETECTED NOT DETECTED Final   Candida tropicalis NOT DETECTED NOT DETECTED Final   Cryptococcus neoformans/gattii NOT DETECTED NOT DETECTED Final   Methicillin resistance mecA/C DETECTED (A) NOT DETECTED Final    Comment: CRITICAL RESULT CALLED TO, READ BACK BY AND VERIFIED WITHHarrold Donath BELUE @ 2253 10/17/22 [facility] Performed at [facility], [address] [place], [place] [address]   Respiratory (~20 pathogens) panel by PCR     Status: None   Collection Time: 10/17/22  9:26 AM   Specimen: Nasopharyngeal Swab; Respiratory  Result Value Ref Range Status   Adenovirus NOT DETECTED NOT DETECTED Final   Coronavirus 229E NOT DETECTED NOT DETECTED Final    Comment: (NOTE) The Coronavirus on the  Respiratory Panel, DOES NOT test for the novel  Coronavirus (2019 nCoV)    Coronavirus HKU1 NOT DETECTED NOT DETECTED Final   Coronavirus NL63 NOT DETECTED NOT DETECTED Final   Coronavirus OC43 NOT DETECTED NOT DETECTED Final   Metapneumovirus NOT DETECTED NOT DETECTED Final   Rhinovirus / Enterovirus NOT DETECTED NOT DETECTED Final   Influenza A NOT DETECTED NOT DETECTED Final   Influenza B NOT DETECTED NOT DETECTED Final   Parainfluenza Virus 1 NOT DETECTED NOT DETECTED Final   Parainfluenza Virus 2 NOT DETECTED NOT DETECTED Final   Parainfluenza Virus 3 NOT DETECTED NOT DETECTED Final   Parainfluenza Virus 4 NOT DETECTED NOT DETECTED Final  Respiratory Syncytial Virus NOT DETECTED NOT DETECTED Final   Bordetella pertussis NOT DETECTED NOT DETECTED Final   Bordetella Parapertussis NOT DETECTED NOT DETECTED Final   Chlamydophila pneumoniae NOT DETECTED NOT DETECTED Final   Mycoplasma pneumoniae NOT DETECTED NOT DETECTED Final    Comment: Performed at [facility], [address] [place], [place] [address]  MRSA Next Gen by PCR, Nasal     Status: None   Collection Time: 10/18/22 10:53 AM   Specimen: Nasal Mucosa; Nasal Swab  Result Value Ref Range Status   MRSA by PCR Next Gen NOT DETECTED NOT DETECTED Final    Comment: (NOTE) The GeneXpert MRSA Assay (FDA approved for NASAL specimens only), is one component of a comprehensive MRSA colonization surveillance program. It is not intended to diagnose MRSA infection nor to guide or monitor treatment for MRSA infections. Test performance is not FDA approved in patients less than 21 years old. Performed at [facility], [address] [place], [place] [address]     Coagulation Studies: No results for input(s): "LABPROT", "INR" in the last 72 hours.   Urinalysis: No results for input(s): "COLORURINE", "LABSPEC", "PHURINE", "GLUCOSEU", "HGBUR", "BILIRUBINUR", "KETONESUR", "PROTEINUR", "UROBILINOGEN", "NITRITE",  "LEUKOCYTESUR" in the last 72 hours.  Invalid input(s): "APPERANCEUR"    Imaging: PERIPHERAL VASCULAR CATHETERIZATION  Result Date: 10/27/2022 See surgical note for result.    Medications:    anticoagulant sodium citrate      Chlorhexidine Gluconate Cloth  6 each Topical Daily   feeding supplement (NEPRO CARB STEADY)  237 mL Oral TID BM   lactulose  30 g Oral QHS   lidocaine  10 mL Intradermal Once   multivitamin  1 tablet Oral QHS   nicotine  21 mg Transdermal Daily   mouth rinse  15 mL Mouth Rinse 4 times per day   pantoprazole (PROTONIX) IV  40 mg Intravenous Q24H   acetaminophen **OR** acetaminophen, albuterol, alteplase, alum & mag hydroxide-simeth, anticoagulant sodium citrate, bismuth subsalicylate, guaiFENesin, heparin, lidocaine (PF), lidocaine-prilocaine, loperamide, LORazepam, magnesium hydroxide, magnesium hydroxide, meclizine, methocarbamol, morphine injection, ondansetron **OR** [DISCONTINUED] ondansetron (ZOFRAN) IV, mouth rinse, oxyCODONE-acetaminophen, pentafluoroprop-tetrafluoroeth, polyethylene glycol, sodium chloride  Assessment/ Plan:  Mr. Austin Marsh is a 67 y.o.  male with past medical history of hypothyroidism, depression with anxiety, schizoaffective, and COPD, who was admitted to [facility] on 10/16/2022 for Lactic acidosis [E87.20] Elevated troponin level [R79.89] Demand ischemia [I24.89] NSTEMI (non-ST elevated myocardial infarction) (HCC) [I21.4] Acute kidney injury (HCC) [N17.9] Septic shock (HCC) [A41.9, R65.21] Sepsis due to undetermined organism (HCC) [A41.9] Altered mental status, unspecified altered mental status type [R41.82] SIRS (systemic inflammatory response syndrome) (HCC) [R65.10]   Acute kidney injury with hyponatremia likely secondary to rhabdomyolysis.  Sodium corrected  -Appreciate vascular surgeon placing right chest PermCath on 10/19/2022.  PermCath functioning well during treatment.  Will place order for nursing to remove HD temp  cath. - Patient receiving dialysis today, UF goal 1 to 1.5 L as tolerated. - Patient remains oliguric. -Next treatment scheduled for Saturday - Will continue to monitor for renal recovery. -CK 415   Lab Results  Component Value Date   CREATININE 5.97 (H) 10/28/2022   CREATININE 4.93 (H) 10/27/2022   CREATININE 6.31 (H) 10/26/2022    Intake/Output Summary (Last 24 hours) at 10/28/2022 1320 Last data filed at 10/28/2022 1300 Gross per 24 hour  Intake 480 ml  Output 1500 ml  Net -1020 ml   2. Anemia of acute blood loss:  Lab Results  Component Value  Date   HGB 7.1 (L) 10/28/2022  Hemoglobin below desired target.  Will defer need of blood transfusion to primary team.  Will order EPO 10,000 units IV with dialysis treatments.  3. Acute respiratory failure requiring HFNC, right lower lobe pneumonia noted with positive blood culture, bacteremia.   -Remains on 2 L nasal cannula.      LOS: 11   7/11/20241:20 PM

## 2022-10-28 NOTE — Evaluation (Signed)
Physical Therapy Evaluation Patient Details Name: Austin Marsh MRN: [id number] DOB: 1956/01/16 Today's Date: 10/28/2022  History of Present Illness  Austin Marsh is a 67 y.o. male with medical history significant of COPD, hypothyroidism, depression with anxiety, BPH, CKD-3A, schizoaffective disorder with bipolar, tardive dyskinesia, OSA on CPAP, who presents with fall and AMS.  Pt was transferred to stepdown for developing shock. Patient's renal function continues to worsen and she continues to remain altered nephrologist made decision for hemodialysis initiation.   Clinical Impression  Patient received in bed, he is alert, oriented to [facility] but that is all. Patient is poor historian, tells me he walked prior to admission. Patient lives at family care home. Wife not present for assessment. Patient is pain limited this session, reporting headache, R LE pain. His general LE strength is 2+/5. Patient rolls in bed with min A. Requires Max assist for supine to sit. He will continue to benefit from skilled PT to improve strength and functional independence.            Assistance Recommended at Discharge Intermittent Supervision/Assistance  If plan is discharge home, recommend the following:  Can travel by private vehicle  A lot of help with bathing/dressing/bathroom;Two people to help with walking and/or transfers;Assist for transportation;Direct supervision/assist for medications management;Help with stairs or ramp for entrance;Assistance with cooking/housework   No    Equipment Recommendations None recommended by PT (TBD)  Recommendations for Other Services       Functional Status Assessment Patient has had a recent decline in their functional status and demonstrates the ability to make significant improvements in function in a reasonable and predictable amount of time.     Precautions / Restrictions Precautions Precautions: Fall Restrictions Weight Bearing Restrictions: No       Mobility  Bed Mobility Overal bed mobility: Needs Assistance Bed Mobility: Rolling, Supine to Sit, Sit to Supine Rolling: Min guard   Supine to sit: Max assist Sit to supine: Max assist   General bed mobility comments: patient limited by pain/weakness    Transfers                   General transfer comment: unable at this time    Ambulation/Gait                  Stairs            Wheelchair Mobility     Tilt Bed    Modified Rankin (Stroke Patients Only)       Balance Overall balance assessment: Needs assistance Sitting-balance support: Feet supported Sitting balance-Leahy Scale: Poor                                       Pertinent Vitals/Pain      Home Living Family/patient expects to be discharged to:: Group home                        Prior Function               Mobility Comments: unsure. Patient is poor historian. He reports he ambulates with walker       Hand Dominance        Extremity/Trunk Assessment   Upper Extremity Assessment Upper Extremity Assessment: Defer to OT evaluation    Lower Extremity Assessment Lower Extremity Assessment: Generalized weakness    Cervical / Trunk Assessment Cervical /  Trunk Assessment: Normal  Communication   Communication: No difficulties  Cognition Arousal/Alertness: Awake/alert Behavior During Therapy: WFL for tasks assessed/performed Overall Cognitive Status: No family/caregiver present to determine baseline cognitive functioning                                          General Comments      Exercises     Assessment/Plan    PT Assessment Patient needs continued PT services  PT Problem List Decreased strength;Decreased activity tolerance;Decreased mobility;Decreased safety awareness;Decreased knowledge of precautions;Decreased knowledge of use of DME;Decreased cognition;Pain       PT Treatment Interventions Functional  mobility training;Therapeutic activities;Patient/family education;DME instruction;Therapeutic exercise    PT Goals (Current goals can be found in the Care Plan section)  Acute Rehab PT Goals Patient Stated Goal: to get his medication PT Goal Formulation: Patient unable to participate in goal setting Time For Goal Achievement: 11/11/22 Potential to Achieve Goals: Fair    Frequency Min 1X/week     Co-evaluation               AM-PAC PT "6 Clicks" Mobility  Outcome Measure Help needed turning from your back to your side while in a flat bed without using bedrails?: A Little Help needed moving from lying on your back to sitting on the side of a flat bed without using bedrails?: A Lot Help needed moving to and from a bed to a chair (including a wheelchair)?: Total Help needed standing up from a chair using your arms (e.g., wheelchair or bedside chair)?: Total Help needed to walk in hospital room?: Total Help needed climbing 3-5 steps with a railing? : Total 6 Click Score: 9    End of Session Equipment Utilized During Treatment: Oxygen Activity Tolerance: Patient limited by pain Patient left: in bed;with call bell/phone within reach;with bed alarm set Nurse Communication: Mobility status PT Visit Diagnosis: Muscle weakness (generalized) (M62.81);Other abnormalities of gait and mobility (R26.89);Difficulty in walking, not elsewhere classified (R26.2);Pain Pain - Right/Left: Right Pain - part of body: Hip;Leg    Time: 1478-2956 PT Time Calculation (min) (ACUTE ONLY): 15 min   Charges:   PT Evaluation $PT Eval Moderate Complexity: 1 Mod   PT General Charges $$ ACUTE PT VISIT: 1 Visit         Carly Sabo, PT, GCS 10/28/22,2:44 PM

## 2022-10-28 NOTE — Progress Notes (Signed)
Pharmacy Consult - Clozapine     67 yo M ordered clozapine 100 mg PO qAM / 200 mg qPM   This patient's order has been reviewed for prescribing contraindications.    Clozapine [facility] enrollment Verified: Yes DATE: 10/28/22 [facility] patient ID: [id number] [facility] Dispense Authorization (RDA): [id number] Current Outpatient Monitoring: Monthly    Home Regimen: 100 mg PO qAM / 200 mg PO qPM Last dose: 7/5 PM   Dose Adjustments This Admission: 7/11 Clozapine re-initiated at 12.5 mg BID   Labs: Date    ANC    Submitted? 10/23/22 4700 Yes   Plan: Re-start clozapine at decreased dose of 12.5 mg BID with plans to rapidly titrate back to prior to admission dose as tolerated Monitor ANC at least weekly while inpatient

## 2022-10-28 NOTE — Progress Notes (Signed)
Date of Admission:  10/16/2022     ID: Mat Stuard is a 67 y.o. male  Principal Problem:   Septic shock (HCC) Active Problems:   Acute kidney injury (HCC)   COPD (chronic obstructive pulmonary disease) (HCC)   Hypothyroidism   Depression with anxiety   Schizoaffective disorder, bipolar type (HCC)   Tobacco abuse   Fall   BPH (benign prostatic hyperplasia)   Acute renal failure superimposed on stage 3a chronic kidney disease (HCC)   Acute metabolic encephalopathy   Hyponatremia   NSTEMI (non-ST elevated myocardial infarction) (HCC)   Acute retention of urine   Rhabdomyolysis   Altered mental status   Hematoma   Bacteremia due to Staphylococcus   Acute respiratory failure with hypoxia (HCC)   Demand ischemia   Elevated troponin level   Lactic acidosis   ESRD on dialysis (HCC)   Pressure injury of skin    Subjective: Out of [facility] Alert but still some confusion  Medications:   benztropine  2 mg Oral BID   busPIRone  15 mg Oral BID   Chlorhexidine Gluconate Cloth  6 each Topical Daily   cloZAPine  12.5 mg Oral BID   divalproex  1,000 mg Oral QHS   doxazosin  4 mg Oral Daily   feeding supplement (NEPRO CARB STEADY)  237 mL Oral TID BM   lactulose  30 g Oral QHS   [START ON 10/29/2022] levothyroxine  75 mcg Oral Q0600   lidocaine  10 mL Intradermal Once   multivitamin  1 tablet Oral QHS   nicotine  21 mg Transdermal Daily   mouth rinse  15 mL Mouth Rinse 4 times per day   [START ON 10/29/2022] pantoprazole  40 mg Oral Daily   traZODone  50 mg Oral QHS   valbenazine  80 mg Oral Daily    Objective: Vital signs in last 24 hours: Patient Vitals for the past 24 hrs:  BP Temp Temp src Pulse Resp SpO2 Weight  10/28/22 1908 130/62 98.3 F (36.8 C) Oral 73 16 97 % --  10/28/22 1628 130/70 98.3 F (36.8 C) -- 91 16 97 % --  10/28/22 1420 (!) 145/79 97.8 F (36.6 C) -- 92 16 96 % --  10/28/22 1404 -- -- -- -- -- -- 96.2 kg  10/28/22 1300 123/81 97.8 F (36.6 C) Oral  90 13 98 % --  10/28/22 1230 124/81 -- -- 87 17 98 % --  10/28/22 1200 (!) 142/67 -- -- 95 14 97 % --  10/28/22 1130 (!) 155/75 -- -- 90 18 100 % --  10/28/22 1100 (!) 149/68 -- -- 89 20 100 % --  10/28/22 1030 132/86 -- -- 91 18 98 % --  10/28/22 1000 (!) 154/88 -- -- 93 (!) 21 100 % --  10/28/22 0930 (!) 161/86 -- -- 90 16 99 % --  10/28/22 0924 (!) 157/78 -- -- 89 (!) 21 100 % --  10/28/22 0907 (!) 164/82 98.1 F (36.7 C) Oral 93 (!) 21 99 % 97.7 kg  10/28/22 0904 (!) 164/82 -- -- 93 (!) 22 98 % --  10/28/22 0500 -- -- -- -- -- -- 85.2 kg  10/28/22 0344 139/64 (!) 97.5 F (36.4 C) Oral 94 18 100 % --    Rt internal jugular HD cath foley  PHYSICAL EXAM:  General: more alert , answers some questions but confused Tardive dyskinesia mouth  Lungs: b/l air entry Heart: s1s2 Hematoma, bruising left upper scapula area  Abdomen: Soft, non-tender,not distended. Bowel sounds normal. No masses Extremities: edema ankles Skin: No rashes or lesions. Or bruising Lymph: Cervical, supraclavicular normal. Neurologic: moves all limbs  Lab Results    Latest Ref Rng & Units 10/28/2022    9:35 AM 10/24/2022    6:15 AM 10/23/2022    5:21 AM  CBC  WBC 4.0 - 10.5 K/uL 9.2  6.7  7.4   Hemoglobin 13.0 - 17.0 g/dL 7.1  7.9  7.5   Hematocrit 39.0 - 52.0 % 21.4  23.2  21.3   Platelets 150 - 400 K/uL 82  72  70        Latest Ref Rng & Units 10/28/2022    4:08 AM 10/27/2022    4:18 AM 10/26/2022    4:19 AM  CMP  Glucose 70 - 99 mg/dL 782  956  78   BUN 8 - 23 mg/dL 73  56  76   Creatinine 0.61 - 1.24 mg/dL 2.13  0.86  5.78   Sodium 135 - 145 mmol/L 139  136  133   Potassium 3.5 - 5.1 mmol/L 4.1  3.8  4.4   Chloride 98 - 111 mmol/L 98  96  90   CO2 22 - 32 mmol/L 31  30  27    Calcium 8.9 - 10.3 mg/dL 8.4  8.0  7.3       Microbiology: BC- staph epidermidis and likely diphtheroid ( contaminants)    Assessment/Plan: Fall with severe rhabdomyolysis. The rhabdomyolysis disproportionate to the  fall which is a ground-level fall in group home   Severe rhabdomyolysis causing AKI   AKI combination of prerenal, urinary retention, rhabdomyolysis The rhabdo is improving but creatinine is worsening He also received contrast which could have aggravated it On Hemodilaysis    Staph epidermidis bacteremia in 1 of bottle and gram-positive rod likely diptheroid as per lab Both are skin bacteria and contamination Is highly likely. Off antibiotics X 7 days After getting 6 days of linezolid Transaminitis- better   Severe hematoma causing anemia He received PRBC and FFP   Thrombocytopenia   Bipolar disorder, schizoaffective disorder.      ID will sign off- call if needed

## 2022-10-28 NOTE — Progress Notes (Signed)
  Received patient in bed to unit.   Informed consent signed and in chart.    TX duration: 3.15 hrs     Transported back to floor  Hand-off given to patient's nurse. No c/o and no distress noted    Access used: R HD permcath Access issues: none   Total UF removed: 1.5L Medication(s) given: none Post HD VS: 123/81 Post HD weight: 96.2kg     Lynann Beaver  [facility]

## 2022-10-28 NOTE — Evaluation (Addendum)
Occupational Therapy Evaluation Patient Details Name: Austin Marsh MRN: [id number] DOB: 26-Aug-1955 Today's Date: 10/28/2022   History of Present Illness Pt is a 67 year old male presenting to ED with fall and AMS; hospital course complicated by stage 3a CKD, septic shock, acute respiratory failure with hypoxia, bacteremia due to staphylococcus, rhabdomyolysis; acute metabolic encephalotomy, NSTEMI, hyponatremia; prior medical history significant of COPD, hypothyroidism, depression with anxiety, BPH, CKD-3A, schizoaffective disorder with bipolar, tardive dyskinesia, OSA on CPAP   Clinical Impression   Chart reviewed, pt greeted in bed, oriented to self only. Pt is tangential throughout and required increased cueing, time for participation in all ADL tasks. Pt is a poor report, per chrat lives in a group home and has some assist for IADLs, pt does endorse he requires assist for some ADLs, ?amb with RW, endorses he uses a mwc. Pt presents with deficits in strength, endurance, activity tolerance, cognition, balance all affecting safe and optimal ADL completion. Pt is left in bed, all needs met, safety maintained, all lines/leads intact. Recommend post acute OT to address deficits. OT will continue to follow acutely.      Recommendations for follow up therapy are one component of a multi-disciplinary discharge planning process, led by the attending physician.  Recommendations may be updated based on patient status, additional functional criteria and insurance authorization.   Assistance Recommended at Discharge Frequent or constant Supervision/Assistance  Patient can return home with the following A lot of help with walking and/or transfers;A lot of help with bathing/dressing/bathroom;Assistance with cooking/housework;Direct supervision/assist for medications management;Direct supervision/assist for financial management;Help with stairs or ramp for entrance;Assist for transportation    Functional  Status Assessment  Patient has had a recent decline in their functional status and demonstrates the ability to make significant improvements in function in a reasonable and predictable amount of time.  Equipment Recommendations  Other (comment) (per next venue of care)    Recommendations for Other Services       Precautions / Restrictions Precautions Precautions: Fall Restrictions Weight Bearing Restrictions: No      Mobility Bed Mobility Overal bed mobility: Needs Assistance Bed Mobility: Supine to Sit, Sit to Supine     Supine to sit: Max assist Sit to supine: Max assist   General bed mobility comments: MAX A +2 for boost in bed; step by step vcs throughout for task at hand    Transfers Overall transfer level: Needs assistance Equipment used: 1 person hand held assist Transfers: Sit to/from Stand Sit to Stand: Total assist           General transfer comment: unable to stand or scoot up the bed while seated without TOTAL A      Balance Overall balance assessment: Needs assistance Sitting-balance support: Feet supported Sitting balance-Leahy Scale: Fair   Postural control: Posterior lean Standing balance support: Single extremity supported Standing balance-Leahy Scale: Zero                             ADL either performed or assessed with clinical judgement   ADL Overall ADL's : Needs assistance/impaired Eating/Feeding: Set up;Sitting   Grooming: Oral care;Sitting;Supervision/safety           Upper Body Dressing : Minimal assistance;Sitting Upper Body Dressing Details (indicate cue type and reason): anticipated Lower Body Dressing: Maximal assistance Lower Body Dressing Details (indicate cue type and reason): socks     Toileting- Clothing Manipulation and Hygiene: Maximal assistance  Vision Patient Visual Report: No change from baseline Additional Comments: will continue to assess     Perception     Praxis       Pertinent Vitals/Pain Pain Assessment Pain Assessment: PAINAD Breathing: normal Negative Vocalization: occasional moan/groan, low speech, negative/disapproving quality Facial Expression: smiling or inexpressive Body Language: relaxed Consolability: distracted or reassured by voice/touch PAINAD Score: 2 Pain Location: generalized, BLE Pain Descriptors / Indicators: Discomfort Pain Intervention(s): Limited activity within patient's tolerance, Monitored during session, Repositioned (RN notified)     Hand Dominance     Extremity/Trunk Assessment Upper Extremity Assessment Upper Extremity Assessment: Generalized weakness (pt with pain with B shoulder flexion; L>R in severity; able to perform AROM to approx 90 degrees bilaterally; Large bruising noted on L shoulder)   Lower Extremity Assessment Lower Extremity Assessment: Generalized weakness   Cervical / Trunk Assessment Cervical / Trunk Assessment: Normal   Communication Communication Communication: No difficulties   Cognition Arousal/Alertness: Awake/alert Behavior During Therapy: WFL for tasks assessed/performed Overall Cognitive Status: No family/caregiver present to determine baseline cognitive functioning Area of Impairment: Orientation, Attention, Memory, Following commands, Safety/judgement, Awareness                 Orientation Level: Disoriented to, Place, Time, Situation Current Attention Level: Focused Memory: Decreased short-term memory, Decreased recall of precautions Following Commands: Follows one step commands inconsistently Safety/Judgement: Decreased awareness of safety, Decreased awareness of deficits Awareness: Intellectual   General Comments: tangential throughout     General Comments  toe nails severely over grown, bruising noted on B arms, over L shoulder: vss; RN notified    Exercises     Shoulder Instructions      Home Living Family/patient expects to be discharged to:: Group home                                  Additional Comments: pt is a poor historian, per chart pt lives in a group home      Prior Functioning/Environment Prior Level of Function : Needs assist             Mobility Comments: pt reports he has not amb in "a long time", per chart? amb with walker and mwc use ADLs Comments: pt endorses he requires assist for dressing and bathing, will need to clarify; assit for IADLs        OT Problem List: Decreased strength;Decreased activity tolerance;Decreased knowledge of use of DME or AE;Decreased safety awareness;Decreased knowledge of precautions;Decreased cognition;Impaired balance (sitting and/or standing)      OT Treatment/Interventions: Self-care/ADL training;DME and/or AE instruction;Therapeutic activities;Balance training;Therapeutic exercise;Patient/family education    OT Goals(Current goals can be found in the care plan section) Acute Rehab OT Goals Patient Stated Goal: go to bed OT Goal Formulation: With patient Time For Goal Achievement: 11/11/22 Potential to Achieve Goals: Good  OT Frequency: Min 1X/week    Co-evaluation              AM-PAC OT "6 Clicks" Daily Activity     Outcome Measure Help from another person eating meals?: A Little Help from another person taking care of personal grooming?: A Little Help from another person toileting, which includes using toliet, bedpan, or urinal?: Total Help from another person bathing (including washing, rinsing, drying)?: A Lot Help from another person to put on and taking off regular upper body clothing?: A Little Help from another person to put on and taking off  regular lower body clothing?: A Lot 6 Click Score: 14   End of Session Nurse Communication: Mobility status  Activity Tolerance: Patient tolerated treatment well Patient left: in bed;with call bell/phone within reach;with bed alarm set  OT Visit Diagnosis: Other abnormalities of gait and mobility  (R26.89);Unsteadiness on feet (R26.81)                Time: 1610-9604 OT Time Calculation (min): 23 min Charges:  OT General Charges $OT Visit: 1 Visit OT Evaluation $OT Eval Moderate Complexity: 1 Mod  Oleta Mouse, OTD OTR/L  10/28/22, 4:24 PM

## 2022-10-29 DIAGNOSIS — R6521 Severe sepsis with septic shock: Secondary | ICD-10-CM | POA: Diagnosis not present

## 2022-10-29 DIAGNOSIS — A419 Sepsis, unspecified organism: Secondary | ICD-10-CM | POA: Diagnosis not present

## 2022-10-29 LAB — PHOSPHORUS: Phosphorus: 3.2 mg/dL (ref 2.5–4.6)

## 2022-10-29 LAB — BASIC METABOLIC PANEL
Anion gap: 8 (ref 5–15)
BUN: 48 mg/dL — ABNORMAL HIGH (ref 8–23)
CO2: 29 mmol/L (ref 22–32)
Calcium: 8.3 mg/dL — ABNORMAL LOW (ref 8.9–10.3)
Chloride: 100 mmol/L (ref 98–111)
Creatinine, Ser: 4.39 mg/dL — ABNORMAL HIGH (ref 0.61–1.24)
GFR, Estimated: 14 mL/min — ABNORMAL LOW (ref >60)
Glucose, Bld: 100 mg/dL — ABNORMAL HIGH (ref 70–99)
Potassium: 3.8 mmol/L (ref 3.5–5.1)
Sodium: 137 mmol/L (ref 135–145)

## 2022-10-29 LAB — CBC
HCT: 20.4 % — ABNORMAL LOW (ref 39.0–52.0)
Hemoglobin: 6.9 g/dL — ABNORMAL LOW (ref 13.0–17.0)
MCH: 31.1 pg (ref 26.0–34.0)
MCHC: 33.8 g/dL (ref 30.0–36.0)
MCV: 91.9 fL (ref 80.0–100.0)
Platelets: 93 10*3/uL — ABNORMAL LOW (ref 150–400)
RBC: 2.22 MIL/uL — ABNORMAL LOW (ref 4.22–5.81)
RDW: 15.1 % (ref 11.5–15.5)
WBC: 11.8 10*3/uL — ABNORMAL HIGH (ref 4.0–10.5)
nRBC: 0 % (ref 0.0–0.2)

## 2022-10-29 LAB — CK: Total CK: 373 U/L (ref 49–397)

## 2022-10-29 LAB — MAGNESIUM: Magnesium: 1.7 mg/dL (ref 1.7–2.4)

## 2022-10-29 LAB — PREPARE RBC (CROSSMATCH)

## 2022-10-29 LAB — HEPATITIS B CORE ANTIBODY, TOTAL: Hep B Core Total Ab: NONREACTIVE

## 2022-10-29 MED ORDER — SODIUM CHLORIDE 0.9% IV SOLUTION: Freq: Once | INTRAVENOUS | Status: AC

## 2022-10-29 NOTE — Progress Notes (Signed)
[facility]  ROUNDING NOTE   Subjective:   Patient seen laying in bed Alert and oriented to self Pleasantly confused and rambling  Dialysis yesterday, tolerated well UOP 400 ml overnight   Objective:  Vital signs in last 24 hours:  Temp:  [97.8 F (36.6 C)-98.9 F (37.2 C)] 98.9 F (37.2 C) (07/12 1107) Pulse Rate:  [73-94] 94 (07/12 1107) Resp:  [13-20] 15 (07/12 0733) BP: (123-151)/(59-81) 135/66 (07/12 1107) SpO2:  [93 %-100 %] 93 % (07/12 1107) Weight:  [96.2 kg-98 kg] 98 kg (07/12 0500)  Weight change: 12.5 kg Filed Weights   10/28/22 0907 10/28/22 1404 10/29/22 0500  Weight: 97.7 kg 96.2 kg 98 kg    Intake/Output: I/O last 3 completed shifts: In: 717 [P.O.:717] Out: 1900 [Urine:400; Other:1500]   Intake/Output this shift:  Total I/O In: -  Out: 125 [Urine:125]  Physical Exam: General: No acute distress  Head: Normocephalic, atraumatic. Dry oral mucosal membranes  Eyes: Anicteric  Lungs:  Scattered rhonchi, normal effort  Heart: Regular rate and rhythm  Abdomen:  Soft, nontender  Extremities: 2+ generalized edema.  Neurologic: Awake, alert  Skin: No rashes  Access: Right femoral temporary dialysis catheter, Rt chest permcath  Foley in place  Basic Metabolic Panel: Recent Labs  Lab 10/25/22 0546 10/26/22 0419 10/27/22 0418 10/28/22 0408 10/29/22 0635  NA 130* 133* 136 139 137  K 5.1 4.4 3.8 4.1 3.8  CL 86* 90* 96* 98 100  CO2 28 27 30 31 29   GLUCOSE 77 78 110* 124* 100*  BUN 100* 76* 56* 73* 48*  CREATININE 9.13* 6.31* 4.93* 5.97* 4.39*  CALCIUM 7.2* 7.3* 8.0* 8.4* 8.3*  MG  --   --  2.0 2.1 1.7  PHOS  --   --  5.2* 5.8* 3.2    Liver Function Tests: No results for input(s): "AST", "ALT", "ALKPHOS", "BILITOT", "PROT", "ALBUMIN" in the last 168 hours. No results for input(s): "LIPASE", "AMYLASE" in the last 168 hours.  No results for input(s): "AMMONIA" in the last 168 hours.  CBC: Recent Labs  Lab 10/23/22 0521  10/24/22 0615 10/28/22 0935 10/29/22 0635  WBC 7.4 6.7 9.2 11.8*  NEUTROABS 4.7 4.0  --   --   HGB 7.5* 7.9* 7.1* 6.9*  HCT 21.3* 23.2* 21.4* 20.4*  MCV 87.7 90.6 94.3 91.9  PLT 70* 72* 82* 93*    Cardiac Enzymes: Recent Labs  Lab 10/25/22 0546 10/26/22 0419 10/27/22 0418 10/28/22 0408 10/29/22 0635  CKTOTAL 830* 881* 694* 415* 373    BNP: Invalid input(s): "POCBNP"  CBG: No results for input(s): "GLUCAP" in the last 168 hours.   Microbiology: Results for orders placed or performed during the hospital encounter of 10/16/22  Blood Culture (routine x 2)     Status: None (Preliminary result)   Collection Time: 10/16/22 11:44 PM   Specimen: BLOOD LEFT ARM  Result Value Ref Range Status   Specimen Description   Final    BLOOD LEFT ARM Performed at [facility], [address], [place], [place] [address]    Special Requests   Final    BOTTLES DRAWN AEROBIC AND ANAEROBIC Blood Culture adequate volume Performed at [facility], [address] [place], [place] [address]    Culture  Setup Time   Final    GRAM POSTIVE ORGANISM AEROBIC BOTTLE ONLY CRITICAL VALUE NOTED.  VALUE IS CONSISTENT WITH PREVIOUSLY REPORTED AND CALLED VALUE.    Culture   Final    GRAM POSITIVE  RODS SENT TO [facility] FOR ID Performed at [facility], [address], [place], [place] [address]    Report Status PENDING  Incomplete  Blood Culture (routine x 2)     Status: Abnormal (Preliminary result)   Collection Time: 10/16/22 11:44 PM   Specimen: BLOOD RIGHT ARM  Result Value Ref Range Status   Specimen Description   Final    BLOOD RIGHT ARM Performed at [facility], [address], [place], [place] [address]    Special Requests   Final    BOTTLES DRAWN AEROBIC AND ANAEROBIC Blood Culture adequate volume Performed at [facility], [address] [place], [place] [address]    Culture  Setup Time   Final    GRAM POSITIVE COCCI ANAEROBIC BOTTLE  ONLY CRITICAL RESULT CALLED TO, READ BACK BY AND VERIFIED WITH: NATHAN BELUE @ 2253 10/17/22 [facility] GRAM POSITIVE RODS AEROBIC BOTTLE ONLY CRITICAL RESULT CALLED TO, READ BACK BY AND VERIFIED WITH: JASON ROBBINS@0310  10/19/22 [facility]    Culture (A)  Final    STAPHYLOCOCCUS EPIDERMIDIS THE SIGNIFICANCE OF ISOLATING THIS ORGANISM FROM A SINGLE SET OF BLOOD CULTURES WHEN MULTIPLE SETS ARE DRAWN IS UNCERTAIN. PLEASE NOTIFY THE MICROBIOLOGY DEPARTMENT WITHIN ONE WEEK IF SPECIATION AND SENSITIVITIES ARE REQUIRED. GRAM POSITIVE RODS SENT TO [facility] FOR ID AND SUSCEPTIBILITIES Performed at [facility], [address] [place], [place] [address]    Report Status PENDING  Incomplete  SARS Coronavirus 2 by RT PCR (hospital order, performed in [facility] hospital lab) *cepheid single result test* Anterior Nasal Swab     Status: None   Collection Time: 10/16/22 11:44 PM   Specimen: Anterior Nasal Swab  Result Value Ref Range Status   SARS Coronavirus 2 by RT PCR NEGATIVE NEGATIVE Final    Comment: (NOTE) SARS-CoV-2 target nucleic acids are NOT DETECTED.  The SARS-CoV-2 RNA is generally detectable in upper and lower respiratory specimens during the acute phase of infection. The lowest concentration of SARS-CoV-2 viral copies this assay can detect is 250 copies / mL. A negative result does not preclude SARS-CoV-2 infection and should not be used as the sole basis for treatment or other patient management decisions.  A negative result may occur with improper specimen collection / handling, submission of specimen other than nasopharyngeal swab, presence of viral mutation(s) within the areas targeted by this assay, and inadequate number of viral copies (<250 copies / mL). A negative result must be combined with clinical observations, patient history, and epidemiological information.  Fact Sheet for Patients:   RoadLapTop.co.za  Fact Sheet for Healthcare  Providers: http://kim-miller.com/  This test is not yet approved or  cleared by the [place] FDA and has been authorized for detection and/or diagnosis of SARS-CoV-2 by FDA under an Emergency Use Authorization (EUA).  This EUA will remain in effect (meaning this test can be used) for the duration of the COVID-19 declaration under Section 564(b)(1) of the Act, 21 U.S.C. section 360bbb-3(b)(1), unless the authorization is terminated or revoked sooner.  Performed at [facility], [address] [place], [place] [address]   Blood Culture ID Panel (Reflexed)     Status: Abnormal   Collection Time: 10/16/22 11:44 PM  Result Value Ref Range Status   Enterococcus faecalis NOT DETECTED NOT DETECTED Final   Enterococcus Faecium NOT DETECTED NOT DETECTED Final   Listeria monocytogenes NOT DETECTED NOT DETECTED Final   Staphylococcus species DETECTED (A) NOT DETECTED Final    Comment: CRITICAL  RESULT CALLED TO, READ BACK BY AND VERIFIED WITH: NATHAN BELUE @ 2253 10/17/22 [facility]    Staphylococcus aureus (BCID) NOT DETECTED NOT DETECTED Final   Staphylococcus epidermidis DETECTED (A) NOT DETECTED Final    Comment: Methicillin (oxacillin) resistant coagulase negative staphylococcus. Possible blood culture contaminant (unless isolated from more than one blood culture draw or clinical case suggests pathogenicity). No antibiotic treatment is indicated for blood  culture contaminants. CRITICAL RESULT CALLED TO, READ BACK BY AND VERIFIED WITH: NATHAN BELUE @ 2253 10/17/22 [facility]    Staphylococcus lugdunensis NOT DETECTED NOT DETECTED Final   Streptococcus species NOT DETECTED NOT DETECTED Final   Streptococcus agalactiae NOT DETECTED NOT DETECTED Final   Streptococcus pneumoniae NOT DETECTED NOT DETECTED Final   Streptococcus pyogenes NOT DETECTED NOT DETECTED Final   A.calcoaceticus-baumannii NOT DETECTED NOT DETECTED Final   Bacteroides fragilis NOT DETECTED NOT DETECTED  Final   Enterobacterales NOT DETECTED NOT DETECTED Final   Enterobacter cloacae complex NOT DETECTED NOT DETECTED Final   Escherichia coli NOT DETECTED NOT DETECTED Final   Klebsiella aerogenes NOT DETECTED NOT DETECTED Final   Klebsiella oxytoca NOT DETECTED NOT DETECTED Final   Klebsiella pneumoniae NOT DETECTED NOT DETECTED Final   Proteus species NOT DETECTED NOT DETECTED Final   Salmonella species NOT DETECTED NOT DETECTED Final   Serratia marcescens NOT DETECTED NOT DETECTED Final   Haemophilus influenzae NOT DETECTED NOT DETECTED Final   Neisseria meningitidis NOT DETECTED NOT DETECTED Final   Pseudomonas aeruginosa NOT DETECTED NOT DETECTED Final   Stenotrophomonas maltophilia NOT DETECTED NOT DETECTED Final   Candida albicans NOT DETECTED NOT DETECTED Final   Candida auris NOT DETECTED NOT DETECTED Final   Candida glabrata NOT DETECTED NOT DETECTED Final   Candida krusei NOT DETECTED NOT DETECTED Final   Candida parapsilosis NOT DETECTED NOT DETECTED Final   Candida tropicalis NOT DETECTED NOT DETECTED Final   Cryptococcus neoformans/gattii NOT DETECTED NOT DETECTED Final   Methicillin resistance mecA/C DETECTED (A) NOT DETECTED Final    Comment: CRITICAL RESULT CALLED TO, READ BACK BY AND VERIFIED WITH: NATHAN BELUE @ 2253 10/17/22 [facility] Performed at [facility], [address] [place], [place] [address]   Respiratory (~20 pathogens) panel by PCR     Status: None   Collection Time: 10/17/22  9:26 AM   Specimen: Nasopharyngeal Swab; Respiratory  Result Value Ref Range Status   Adenovirus NOT DETECTED NOT DETECTED Final   Coronavirus 229E NOT DETECTED NOT DETECTED Final    Comment: (NOTE) The Coronavirus on the Respiratory Panel, DOES NOT test for the novel  Coronavirus (2019 nCoV)    Coronavirus HKU1 NOT DETECTED NOT DETECTED Final   Coronavirus NL63 NOT DETECTED NOT DETECTED Final   Coronavirus OC43 NOT DETECTED NOT DETECTED Final   Metapneumovirus NOT  DETECTED NOT DETECTED Final   Rhinovirus / Enterovirus NOT DETECTED NOT DETECTED Final   Influenza A NOT DETECTED NOT DETECTED Final   Influenza B NOT DETECTED NOT DETECTED Final   Parainfluenza Virus 1 NOT DETECTED NOT DETECTED Final   Parainfluenza Virus 2 NOT DETECTED NOT DETECTED Final   Parainfluenza Virus 3 NOT DETECTED NOT DETECTED Final   Parainfluenza Virus 4 NOT DETECTED NOT DETECTED Final   Respiratory Syncytial Virus NOT DETECTED NOT DETECTED Final   Bordetella pertussis NOT DETECTED NOT DETECTED Final   Bordetella Parapertussis NOT DETECTED NOT DETECTED Final   Chlamydophila pneumoniae NOT DETECTED NOT DETECTED Final   Mycoplasma pneumoniae NOT DETECTED NOT DETECTED Final  Comment: Performed at [facility], [address] [place], [place] [address]  MRSA Next Gen by PCR, Nasal     Status: None   Collection Time: 10/18/22 10:53 AM   Specimen: Nasal Mucosa; Nasal Swab  Result Value Ref Range Status   MRSA by PCR Next Gen NOT DETECTED NOT DETECTED Final    Comment: (NOTE) The GeneXpert MRSA Assay (FDA approved for NASAL specimens only), is one component of a comprehensive MRSA colonization surveillance program. It is not intended to diagnose MRSA infection nor to guide or monitor treatment for MRSA infections. Test performance is not FDA approved in patients less than 28 years old. Performed at [facility], [address] [place], [place] [address]     Coagulation Studies: No results for input(s): "LABPROT", "INR" in the last 72 hours.   Urinalysis: No results for input(s): "COLORURINE", "LABSPEC", "PHURINE", "GLUCOSEU", "HGBUR", "BILIRUBINUR", "KETONESUR", "PROTEINUR", "UROBILINOGEN", "NITRITE", "LEUKOCYTESUR" in the last 72 hours.  Invalid input(s): "APPERANCEUR"    Imaging: PERIPHERAL VASCULAR CATHETERIZATION  Result Date: 10/27/2022 See surgical note for result.    Medications:    anticoagulant sodium citrate      sodium chloride    Intravenous Once   benztropine  2 mg Oral BID   busPIRone  15 mg Oral BID   Chlorhexidine Gluconate Cloth  6 each Topical Daily   cloZAPine  12.5 mg Oral BID   divalproex  1,000 mg Oral QHS   doxazosin  4 mg Oral Daily   feeding supplement (NEPRO CARB STEADY)  237 mL Oral TID BM   lactulose  30 g Oral QHS   levothyroxine  75 mcg Oral Q0600   lidocaine  10 mL Intradermal Once   multivitamin  1 tablet Oral QHS   nicotine  21 mg Transdermal Daily   mouth rinse  15 mL Mouth Rinse 4 times per day   pantoprazole  40 mg Oral Daily   traZODone  50 mg Oral QHS   valbenazine  80 mg Oral Daily   acetaminophen **OR** acetaminophen, albuterol, alteplase, alum & mag hydroxide-simeth, anticoagulant sodium citrate, bismuth subsalicylate, guaiFENesin, heparin, lidocaine (PF), lidocaine-prilocaine, loperamide, LORazepam, magnesium hydroxide, magnesium hydroxide, meclizine, methocarbamol, morphine injection, ondansetron **OR** [DISCONTINUED] ondansetron (ZOFRAN) IV, mouth rinse, oxyCODONE-acetaminophen, pentafluoroprop-tetrafluoroeth, polyethylene glycol, sodium chloride  Assessment/ Plan:  Mr. Austin Marsh is a 67 y.o.  male with past medical history of hypothyroidism, depression with anxiety, schizoaffective, and COPD, who was admitted to [facility] on 10/16/2022 for Lactic acidosis [E87.20] Elevated troponin level [R79.89] Demand ischemia [I24.89] NSTEMI (non-ST elevated myocardial infarction) (HCC) [I21.4] Acute kidney injury (HCC) [N17.9] Septic shock (HCC) [A41.9, R65.21] Sepsis due to undetermined organism (HCC) [A41.9] Altered mental status, unspecified altered mental status type [R41.82] SIRS (systemic inflammatory response syndrome) (HCC) [R65.10]   Acute kidney injury with hyponatremia likely secondary to rhabdomyolysis.  Sodium corrected  -Appreciate vascular surgeon placing right chest PermCath on 10/27/2022.    - Appreciate nursing staff for removing HD temp cath  - Next treatment scheduled  for Saturday, seated in chair  - Now that primary decision maker has been assigned, renal navigator will seek outpatient clinic.    Lab Results  Component Value Date   CREATININE 4.39 (H) 10/29/2022   CREATININE 5.97 (H) 10/28/2022   CREATININE 4.93 (H) 10/27/2022    Intake/Output Summary (Last 24 hours) at 10/29/2022 1247 Last data filed at 10/29/2022 1100 Gross per 24 hour  Intake 237 ml  Output 2025 ml  Net -1788 ml  2. Anemia of acute blood loss:  Lab Results  Component Value Date   HGB 6.9 (L) 10/29/2022  Hemoglobin decreased. Primary team has ordered 1 unit of blood. Will order EPO 10,000 units IV with dialysis treatments.  3. Acute respiratory failure requiring HFNC, right lower lobe pneumonia noted with positive blood culture, bacteremia.   - 2 L nasal cannula.      LOS: 12   7/12/202412:47 PM

## 2022-10-29 NOTE — TOC CM/SW Note (Signed)
RE: Austin Marsh Date of Birth: 1955-08-13 Date: 10/29/2022   To Whom It May Concern:  Please be advised that the above-named patient will require a short-term nursing home stay - anticipated 30 days or less for rehabilitation and strengthening.  The plan is for return home.

## 2022-10-29 NOTE — Progress Notes (Signed)
Progress Note   Patient: Austin Marsh ZOX:[id number] DOB: 1955/09/25 DOA: 10/16/2022     12 DOS: the patient was seen and examined on 10/29/2022      Brief hospital course: "Lavance Bratz is a 67 y.o. male with medical history significant of COPD, hypothyroidism, depression with anxiety, BPH, CKD-3A, schizoaffective disorder with bipolar, tardive dyskinesia, OSA on CPAP, who presents with fall and AMS."  Caregiver at family care facility.  reported pt was in his normal health condition until 10:30 PM yesterday when pt had fall and was found on the floor. At normal baseline, patient is alert oriented x 3 but today is confused.  On admission, pt was lethargic, oriented to person and place, not to year or situation.  He reported right hip pain with moving right leg, low back pain, some chest pain which he was unable to characterize further, constipation, suprapubic pain.  He was found to have acute urinary retention (973 cc on bladder scan).  Foley catheter was placed.     In the ED - fever of 101, HR 123, RR 26, stable O2 sat on room air.  Per EMS report, patient had low BP, but at time of admission BP was stable at 124/73.   Chest x-ray negative.   CT of head negative for acute intracranial abnormalities.   CT Chest/Abd/Pelvis: Moderate coronary artery calcification. No acute intrathoracic or intra-abdominal pathology identified. Stable 9 mm right adrenal adenoma. Stable changes of adrenal hyperplasia. Marked distention of the bladder possibly reflecting changes of bladder outlet obstruction. Mild prostatic hypertrophy.  Probable post TURP changes.  X-ray of bilateral hips/pelvis negative for fracture.   X-ray of L-spine and T-spine negative for acute fracture, did showed degenerative disc disease    Notable labs - WBC 11.2, severe AKI with Cr 7.78, sodium 123, negative COVID PCR, lactic acid> 9.0 ---> 4.9, troponin level 172, 406, 804, 784.  Sodium 123, magnesium 2.3,  Hemoglobin 11.5.   Pt was  admitted to PCU. Subsequently found to have severe rhabdomyolysis with CK peaking above 50k. IV heparin was started and cardiology consulted due to troponin elevation.   7/1 -- pt hypotensive, requiring IV fluid boluses.  Worsening encephalopathy, Hbg dropped to 6.3, large area of ecchymosis and swelling in the left neck & upper shoulder concerning for hematoma, later confirmed on CT check and neck soft tissue.  Pt was transferred to stepdown for developing shock. PCCM consulted.  Transfused 2 units pRBC's, 1 FFP.   7/2 -- BP's improved after 3 units pRBC's, 1 FFP total.  Hbg improved from nadir 5.4 to 10.1 >> 9.3 this AM.  +Blood cultures with GPC's and GPR's.  ID consulted.  CK trending down. Nephrology consulted with worsening renal function and poor urine output reported.   7/3-7/8 patient's renal function continues to worsen and she continues to remain altered nephrologist made decision for hemodialysis initiation.  Patient's wife is in the psych unit and according to psychiatry lacks capacity to make decision for patient.   Assessment and Plan:     Acute kidney injury (HCC)-worsening Stage 3a CKD: Baseline Cr 1.17 on 06/04/2022.  Cr on admission 7.78 AKI likely due to severe rhabdomyolysis, sepsis, dehydration, urinary retention secondary to bladder outlet obstruction. Case discussed with nephrology team Avoid nephrotoxic medications Sodium bicarb drip discontinued Had dialysis catheter placement on 10/23/2022 Hemodialysis was initiated on patient on 10/23/2022 R internal jugular Permcath placed 7/10 Able to engage in some conversation today but still confused We will continue pursuing any additional  family members for discussion of goals of care     * Septic shock (HCC)-improved On admission, tachycardic, tachypneic, febrile. Lactic acid was above 9 meeting septic shock criteria (with dehydration, AKI, rhabdo contributing).  Hypotension seemed more due to acute blood loss 2/2 left neck  hematoma.  BP responded to fluids, blood products and midodrine, did not require pressors. Has bacteremia and RLL pneumonia suspect acute aspiration on 7/1 while encephalopathic  Patient completed a course of Zyvox. Antibiotics discontinued by infectious disease. Continue to monitor closely Infectious disease on board   Acute respiratory failure with hypoxia (HCC) Right lower lobe pneumonia, not POA, suspect acute aspiration event on 7/1. Antibiotics completed and discontinued by ID on 10/22/2022 Weaned off O2 and stable on room air  7/12 --- RN reported choking spell during breakfast --Low threshold to repeat chest xray, will hold off for now & monitor  --Robitussin PRN   Bacteremia due to Staphylococcus Initial blood cx +staph epidermidis felt contaminant.  Has completed antibiotics and discontinued by ID on 10/22/2022 Infectious disease on board we appreciate input   Hematoma-resolved Hematoma Left Neck/Shoulder  Acute Blood Loss Anemia Hypotension due to above and ?septic shock 7/1 - Hbg dropped from 11.5 >> 6.3 and pt noted to have new swelling and ecchymosis at the left neck & upper shoulder.  7/11 - Hbg 6.9 - transfused 1 unit pRBC's (4th total) --Heparin was stopped  --Transfused total 4 units pRBC's and 1 FFP --Transferred to stepdown 7/1 --PCCM consulted for pressors if needed - signed off, BP's stabilized Continue to monitor CBC closely   Rhabdomyolysis-improved CK peaked > 50000 Given aggressive IV hydration for this and hypotension, then devoped hypoxia on 7/1, concern for pulmonary edema but found to have RLL collapse.  Fluid held as patient requiring dialysis now CK normalized on 7/12, 373   Acute metabolic encephalopathy CT head on admission negative.  Multifactorial due to metabolic derangements, sepsis / bacteremia, underlying psych conditions Home psych meds resumed  now that he can take PO meds Continue delirium precautions -As needed Ativan for severe  agitation otherwise we will avoid benzodiazepine use     Acute retention of urine Patient has underlying BPH Foley placed on admission. Blood pressure improved however patient unable to take his oral Cardura Resume doxazosin  Outpatient follow-up with urologist if unable to pass voiding trial when medically ready    NSTEMI (non-ST elevated myocardial infarction) (HCC) Elevated troponin - demand ischemia & delayed clearance with severe AKI. -Cardiology was consulted, signed off --IV heparin stopped 7/1 AM with acute anemia and hematoma Continue to monitor closely   Fall Unknown cause, pt was found on the floor at his family care home, per caregiver's history on admission. --PT OT evaluation when clinically improved --Imaging was negative for acute injuries / fractures --Neck hematoma developed 7/1 Confirmed on CT chest and neck soft tissue obtained later on 7/1 PM   COPD (chronic obstructive pulmonary disease) (HCC) Stable Continue PRN bronchodilators and Mucinex   Hyponatremia-improving Sodium 124 on 10/21/2022 Saline fluids stopped due to hypoxia on 7/1 Monitor sodium level closely Nephrology following    Hypothyroidism Continue Synthroid     Schizoaffective disorder, bipolar type (HCC) Depression with anxiety -Resume home Benztropine, BuSpar, clozapine, Depakote, Ingrezza (held while unable to take PO meds) --Allergy listed to haldol, will use IV Ativan PRN if agitation   Tobacco abuse Nicotine patch ordered       Subjective / Interval history Pt seen awake sitting up in bed,  RN at bedside this AM.  He reports feeling okay.  Has a productive cough.  RN reports pt got choke while eating breakfast this AM.  Pt otherwise pleasantly confused and conversational. States he and wife married 46 years, she came to visit yesterday.      Disposition: Anticipate will need SNF due to prolonged admission.  Status is: Inpatient Remains inpatient appropriate because:  continues to require hemodialysis and IV therapies      Physical examination:   General exam: awake, talkative, no acute distress, confused HEENT: left neck and upper shoulder ecchymosis stable, moist mucus membranes, hearing grossly normal  Respiratory system: CTAB, no wheezes, rales or rhonchi, normal respiratory effort. Cardiovascular system:R internal jugular dialysis catheter accessed, RRR, trace pedal edema.   Gastrointestinal system: soft, NT, ND, no HSM felt, +bowel sounds. Central nervous system: A&O x self at least. no gross focal neurologic deficits, normal speech, follow commands Extremities: SCD's on legs, trace pedal edema Skin: dry, intact, normal temperature Psychiatry: normal mood, congruent affect, confused       Data Reviewed: Notable labs --- glucose 100, BUN 48, Cr 4.39, Ca 8.3, Hbg 6.9, platelets 93k, WBC 11.8k  CK normalized 373    Family Communication: None  Per Dr. Meriam Sprague 10/26/22: "Spoke with case management who also spoke with patient's group home and apparently patient's may have a sister.  We are trying to get in touch with patient's sister to help with decision making   Patient's wife is in the psych unit and according to psychiatry lacks capacity to make decision for patient."         Author: Pennie Banter, DO 10/29/2022 3:01 PM  For on call review [email or web address].

## 2022-10-29 NOTE — Plan of Care (Signed)
  Problem: Clinical Measurements: Goal: Ability to maintain clinical measurements within normal limits will improve Outcome: Progressing Goal: Respiratory complications will improve Outcome: Adequate for Discharge Goal: Cardiovascular complication will be avoided Outcome: Adequate for Discharge   Problem: Activity: Goal: Risk for activity intolerance will decrease Outcome: Not Progressing

## 2022-10-29 NOTE — Progress Notes (Signed)
Physical Therapy Treatment Patient Details Name: Austin Marsh MRN: [id number] DOB: 1955-12-17 Today's Date: 10/29/2022   History of Present Illness Pt is a 67 year old male presenting to ED with fall and AMS; hospital course complicated by stage 3a CKD, septic shock, acute respiratory failure with hypoxia, bacteremia due to staphylococcus, rhabdomyolysis; acute metabolic encephalotomy, NSTEMI, hyponatremia; prior medical history significant of COPD, hypothyroidism, depression with anxiety, BPH, CKD-3A, schizoaffective disorder with bipolar, tardive dyskinesia, OSA on CPAP    PT Comments  Pt supine in bed resting at start of session. Pt agreeable for sitting edge of bed with PT. Mod A for supine to sit with HHA. Sitting EOB ~3 minutes with feet supported and 1 UE supported. Patient concerned about a little red box shutting off through out the session. Patient returned to bed mod a w/ assist with his feet. Scooted toward HOB max assist +2 with Pt attempting to push through both Les. Pt left in bed with call bell and alarm set.     Assistance Recommended at Discharge Intermittent Supervision/Assistance  If plan is discharge home, recommend the following:  Can travel by private vehicle    A lot of help with bathing/dressing/bathroom;Two people to help with walking and/or transfers;Assist for transportation;Direct supervision/assist for medications management;Help with stairs or ramp for entrance;Assistance with cooking/housework   No  Equipment Recommendations  None recommended by PT    Recommendations for Other Services       Precautions / Restrictions Precautions Precautions: Fall Restrictions Weight Bearing Restrictions: No     Mobility  Bed Mobility Overal bed mobility: Needs Assistance Bed Mobility: Supine to Sit, Sit to Supine     Supine to sit: Mod assist Sit to supine: Mod assist        Transfers                   General transfer comment: unable to  assess    Ambulation/Gait               General Gait Details: unable to assess   Stairs             Wheelchair Mobility     Tilt Bed    Modified Rankin (Stroke Patients Only)       Balance Overall balance assessment: Needs assistance Sitting-balance support: Feet supported, Single extremity supported Sitting balance-Leahy Scale: Fair                                      Cognition Arousal/Alertness: Awake/alert Behavior During Therapy: WFL for tasks assessed/performed Overall Cognitive Status: No family/caregiver present to determine baseline cognitive functioning Area of Impairment: Orientation, Attention, Memory, Following commands, Safety/judgement, Awareness                 Orientation Level: Disoriented to, Place, Time, Situation Current Attention Level: Focused Memory: Decreased short-term memory, Decreased recall of precautions Following Commands: Follows one step commands inconsistently Safety/Judgement: Decreased awareness of safety, Decreased awareness of deficits Awareness: Intellectual   General Comments: tangential throughout        Exercises      General Comments        Pertinent Vitals/Pain Pain Assessment Pain Assessment: PAINAD Breathing: normal Negative Vocalization: occasional moan/groan, low speech, negative/disapproving quality Facial Expression: sad, frightened, frown Body Language: relaxed Consolability: no need to console PAINAD Score: 2 Pain Location: generalized, BLE Pain Descriptors / Indicators: Discomfort Pain Intervention(s):  Limited activity within patient's tolerance, Monitored during session    Home Living                          Prior Function            PT Goals (current goals can now be found in the care plan section) Acute Rehab PT Goals Patient Stated Goal: None stated PT Goal Formulation: Patient unable to participate in goal setting Time For Goal Achievement:  11/11/22 Potential to Achieve Goals: Fair Progress towards PT goals: Progressing toward goals    Frequency    Min 1X/week      PT Plan Current plan remains appropriate    Co-evaluation              AM-PAC PT "6 Clicks" Mobility   Outcome Measure  Help needed turning from your back to your side while in a flat bed without using bedrails?: A Little Help needed moving from lying on your back to sitting on the side of a flat bed without using bedrails?: A Lot Help needed moving to and from a bed to a chair (including a wheelchair)?: Total Help needed standing up from a chair using your arms (e.g., wheelchair or bedside chair)?: Total Help needed to walk in hospital room?: Total Help needed climbing 3-5 steps with a railing? : Total 6 Click Score: 9    End of Session   Activity Tolerance: Patient tolerated treatment well Patient left: in bed;with call bell/phone within reach;with bed alarm set Nurse Communication: Mobility status PT Visit Diagnosis: Muscle weakness (generalized) (M62.81);Other abnormalities of gait and mobility (R26.89);Difficulty in walking, not elsewhere classified (R26.2);Pain Pain - Right/Left: Right Pain - part of body: Hip;Leg     Time: 0981-1914 PT Time Calculation (min) (ACUTE ONLY): 9 min  Charges:    $Therapeutic Activity: 8-22 mins PT General Charges $$ ACUTE PT VISIT: 1 Visit                     Malachi Carl, SPT    Malachi Carl 10/29/2022, 4:20 PM

## 2022-10-29 NOTE — TOC Progression Note (Addendum)
Transition of Care [facility]) - Progression Note    Patient Details  Name: Austin Marsh MRN: [id number] Date of Birth: 1956-04-07  [facility]) CM/SW Contact  Margarito Liner, LCSW Phone Number: 10/29/2022, 9:27 AM  Clinical Narrative:  Left voicemail for APS social worker to see if there are any updates.   12:25 pm: CSW spoke to [place] with APS. They are working on the paperwork to pursue guardianship. Stepdaughter can make decisions in the meantime. CSW called stepdaughter to discuss therapy recommendations. She is agreeable to SNF placement. She does not want him returning to his group home so CSW noted on referral that he may need LTC after rehab.  12:42 pm: PASARR under manual review. Uploaded requested clinicals into [place] Must.  4:06 pm: PASARR obtained: [id number]. Expires 8/11.  Expected Discharge Plan and Services                                               Social Determinants of Health (SDOH) Interventions SDOH Screenings   Food Insecurity: No Food Insecurity (10/17/2022)  Housing: Patient Unable To Answer (10/17/2022)  Tobacco Use: High Risk (10/16/2022)    Readmission Risk Interventions    03/17/2020    1:48 PM  Readmission Risk Prevention Plan  Transportation Screening Complete  PCP or Specialist Appt within 3-5 Days --  HRI or Home Care Consult --  Palliative Care Screening Not Applicable  Medication Review (RN Care Manager) Complete

## 2022-10-29 NOTE — Plan of Care (Signed)
  Problem: Education: Goal: Knowledge of General Education information will improve Description: Including pain rating scale, medication(s)/side effects and non-pharmacologic comfort measures Outcome: Progressing   Problem: Health Behavior/Discharge Planning: Goal: Ability to manage health-related needs will improve Outcome: Progressing   Problem: Clinical Measurements: Goal: Ability to maintain clinical measurements within normal limits will improve Outcome: Progressing Goal: Will remain free from infection 10/29/2022 1611 by Larose Kells, RN Outcome: Progressing 10/29/2022 1611 by Larose Kells, RN Outcome: Progressing Goal: Diagnostic test results will improve Outcome: Progressing Goal: Respiratory complications will improve 10/29/2022 1611 by Larose Kells, RN Outcome: Progressing 10/29/2022 1611 by Larose Kells, RN Outcome: Progressing Goal: Cardiovascular complication will be avoided 10/29/2022 1611 by Larose Kells, RN Outcome: Progressing 10/29/2022 1611 by Larose Kells, RN Outcome: Progressing   Problem: Activity: Goal: Risk for activity intolerance will decrease Outcome: Progressing   Problem: Nutrition: Goal: Adequate nutrition will be maintained Outcome: Progressing   Problem: Coping: Goal: Level of anxiety will decrease Outcome: Progressing   Problem: Elimination: Goal: Will not experience complications related to bowel motility 10/29/2022 1611 by Larose Kells, RN Outcome: Progressing 10/29/2022 1611 by Larose Kells, RN Outcome: Progressing Goal: Will not experience complications related to urinary retention 10/29/2022 1611 by Larose Kells, RN Outcome: Progressing 10/29/2022 1611 by Larose Kells, RN Outcome: Progressing   Problem: Pain Managment: Goal: General experience of comfort will improve Outcome: Progressing   Problem: Safety: Goal: Ability to remain free from injury will improve 10/29/2022 1611 by  Larose Kells, RN Outcome: Progressing 10/29/2022 1611 by Larose Kells, RN Outcome: Progressing   Problem: Skin Integrity: Goal: Risk for impaired skin integrity will decrease Outcome: Progressing   Problem: Fluid Volume: Goal: Hemodynamic stability will improve Outcome: Progressing   Problem: Clinical Measurements: Goal: Diagnostic test results will improve Outcome: Progressing Goal: Signs and symptoms of infection will decrease Outcome: Progressing   Problem: Respiratory: Goal: Ability to maintain adequate ventilation will improve Outcome: Progressing

## 2022-10-29 NOTE — Progress Notes (Signed)
Speech Language Pathology Treatment: Dysphagia  Patient Details Name: Austin Marsh MRN: [id number] DOB: 09/19/1955 Today's Date: 10/29/2022 Time: 2841-3244 SLP Time Calculation (min) (ACUTE ONLY): 25 min  Assessment / Plan / Recommendation Clinical Impression  Pt seen today for diet toleration of recently upgraded diet to a Dysphagia level 2 w/ thin liquids; education on general aspiration precautions. Pt was alert, talkative and eager to feed self po trials of tea via Cup offered, but he refused foods offered -- noted he had not eaten lunch meal.  NSG reported pt fed himself breakfast meal, coughing noted during the meal. NSG stated pt was feeding himself -- pt may need oversight w/ such for safety.  Pt's Home Care NSG stated yesterday that he is at his Baseline Cognitive-communication abilities.    Pt positioned upright in bed. He consumed trials of thin liquids -  ~4 ozs via Cup. He appears to present w/ grossly functional oropharyngeal phase swallowing w/ no overt clinical s/s of aspiration noted during intake. W/ food trials yesterday, min extra Time and effort were noted w/ increased textured foods during the oral phase in setting of Edentulous status. Pt also presents w/ declined Cognitive-communication functioning in general. ANY Cognitive decline can impact overall awareness/timing of swallow and safety during po tasks which increases risk for aspiration, choking thus Pulmonary decline.   Pt appears to present w/ reduced risk for aspiration/aspiration pneumonia w/ oral intake when following general aspiration precautions, using a modified diet consistency of minced foods, and when given Supervision during meals for follow through w/ precautions.        W/ the trials of thin liquids via Cup, he fed self w/ setup support and Supervision given. He was Supervised and verbal instruction given on followed through w/ general aspiration precautions of SMALL sips. He demonstrated this during  intake. No overt pharyngeal phase swallow deficits were appreciated; no decline in vocal quality, no immediate cough, no decline in O2 sats 99%. Oral phase was c/b functional bolus management and timely A-P transfer to swallow; he demonstrated the same w/ the 1 trial of puree. He sat fully upright in bed w/ full/free head/neck movements.  He declined food trials altogether but finished drinking the sweet tea.        In setting of improved oropharyngeal swallowing and attention during po intake w/ diet, recommend continue the Dysphagia level 2 (MINCED) foods d/t Edentulous status w/ Thin liquids via CUP. Aspiration precautions and 100% Supervision and Feeding support at meals as needed to ensure SMALL bites and oral clearing w/ foods.  Engage pt in self-feeding and holding Cup to drink -- NO Straws. Pills CRUSHED vs Whole in Puree.  ST services recommends f/u at next venue of care if any further needs as pt continues to improve and return to his functional baseline w/ eating/meals w/ the baseline Edentulous status. MD/NSG updated, agreed. Precautions posted in room. Dietician following.      HPI HPI: Pt is a 67 y.o. male with medical history significant of COPD, hypothyroidism, depression with anxiety, BPH, CKD-3A, schizoaffective disorder with bipolar, tardive dyskinesia, OSA on CPAP, who presents with fall and AMS.  Pt resides at a [facility].  Per care giver, at normal baseline, patient is alert oriented x 3.  Upon admit to the ED, the pt was lethargic and confused. Pt admited w/ SIRS and rhabdomyolysis w/ hyponatremia.  He required BIPAP in ICU -- NSG reported concern he may have aspirated secretions/phelgm d/t his thick secretions/congestion.  CT Head: no acute abnormaility.  CT Chest: Debris in the right lower lobe bronchus (series  3/image 34), new from recent prior, with new right lower lobe  atelectasis/collapse. This appearance favors mucous plugging.      SLP Plan  All goals  met      Recommendations for follow up therapy are one component of a multi-disciplinary discharge planning process, led by the attending physician.  Recommendations may be updated based on patient status, additional functional criteria and insurance authorization.    Recommendations  Diet recommendations: Dysphagia 2 (fine chop);Thin liquid Liquids provided via: Cup;No straw Medication Administration: Crushed with puree (vs whole in puree if able to w/ NSG) Supervision: Patient able to self feed;Staff to assist with self feeding;Full supervision/cueing for compensatory strategies Compensations: Minimize environmental distractions;Slow rate;Small sips/bites;Lingual sweep for clearance of pocketing;Follow solids with liquid Postural Changes and/or Swallow Maneuvers: Out of bed for meals;Seated upright 90 degrees;Upright 30-60 min after meal                 (Dietician) Oral care BID;Oral care before and after PO;Staff/trained caregiver to provide oral care   Frequent or constant Supervision/Assistance Dysphagia, unspecified (R13.10) (Edentulous; Cognitive decline)     All goals met      Jerilynn Som, MS, CCC-SLP Speech Language Pathologist Rehab Services; [facility] [phone] (ascom) Keyanni Whittinghill  10/29/2022, 4:22 PM

## 2022-10-29 NOTE — NC FL2 (Signed)
[place] MEDICAID FL2 LEVEL OF CARE FORM     IDENTIFICATION  Patient Name: Austin Marsh Birthdate: 1956/03/30 Sex: male Admission Date (Current Location): 10/16/2022  [facility] and [place] Number:  Chiropodist and Address:  [facility], [address], [place], [place] [address]      Provider Number: [id number]  Attending Physician Name and Address:  Pennie Banter, DO  Relative Name and Phone Number:       Current Level of Care: Hospital Recommended Level of Care: Skilled Nursing Facility Prior Approval Number:    Date Approved/Denied:   PASRR Number: Manual review  Discharge Plan: SNF    Current Diagnoses: Patient Active Problem List   Diagnosis Date Noted   ESRD on dialysis (HCC) 10/27/2022   Pressure injury of skin 10/27/2022   Demand ischemia 10/22/2022   Elevated troponin level 10/22/2022   Lactic acidosis 10/22/2022   Bacteremia due to Staphylococcus 10/19/2022   Acute respiratory failure with hypoxia (HCC) 10/19/2022   Altered mental status 10/18/2022   Hematoma 10/18/2022   Sepsis due to undetermined organism (HCC) 10/17/2022   Septic shock (HCC) 10/17/2022   BPH (benign prostatic hyperplasia) 10/17/2022   Acute renal failure superimposed on stage 3a chronic kidney disease (HCC) 10/17/2022   Acute metabolic encephalopathy 10/17/2022   Hyponatremia 10/17/2022   NSTEMI (non-ST elevated myocardial infarction) (HCC) 10/17/2022   Acute retention of urine 10/17/2022   Rhabdomyolysis 10/17/2022   Generalized weakness    UTI (urinary tract infection) 03/11/2020   Acute kidney injury (HCC) 03/11/2020   Sepsis (HCC) 03/11/2020   COPD (chronic obstructive pulmonary disease) (HCC)    Hypothyroidism    Depression with anxiety    Schizoaffective disorder, bipolar type (HCC)    Tobacco abuse    Fall    Scrotal pain 06/26/2018    Orientation RESPIRATION BLADDER Height & Weight     Self, Place  Normal (No bipap since  7/1) Incontinent, Indwelling catheter Weight: 216 lb 0.8 oz (98 kg) Height:  5\' 9"  (175.3 cm)  BEHAVIORAL SYMPTOMS/MOOD NEUROLOGICAL BOWEL NUTRITION STATUS   (None)  (None) Incontinent Diet  AMBULATORY STATUS COMMUNICATION OF NEEDS Skin   Extensive Assist Verbally Bruising, Other (Comment), PU Stage and Appropriate Care (Petechiae.)   PU Stage 2 Dressing:  (Right elbow: Foam prn)                   Personal Care Assistance Level of Assistance  Bathing, Feeding, Dressing Bathing Assistance: Maximum assistance Feeding assistance: Limited assistance Dressing Assistance: Maximum assistance     Functional Limitations Info  Sight, Hearing, Speech Sight Info: Adequate Hearing Info: Adequate Speech Info: Adequate    SPECIAL CARE FACTORS FREQUENCY  PT (By licensed PT), OT (By licensed OT)     PT Frequency: 5 x week OT Frequency: 5 x week            Contractures Contractures Info: Not present    Additional Factors Info  Code Status, Allergies Code Status Info: Full code Allergies Info: Haldol (Haloperidol Lactate), Navy beans, Thorazine (chlorpromazine), Trazodone and Nefazodone           Current Medications (10/29/2022):  This is the current hospital active medication list Current Facility-Administered Medications  Medication Dose Route Frequency Provider Last Rate Last Admin   0.9 %  sodium chloride infusion (Manually program via Guardrails IV Fluids)   Intravenous Once Esaw Grandchild A, DO       acetaminophen (TYLENOL) tablet 650 mg  650  mg Oral Q6H PRN Annice Needy, MD   650 mg at 10/28/22 1640   Or   acetaminophen (TYLENOL) suppository 650 mg  650 mg Rectal Q6H PRN Annice Needy, MD       albuterol (PROVENTIL) (2.5 MG/3ML) 0.083% nebulizer solution 2.5 mg  2.5 mg Nebulization Q4H PRN Annice Needy, MD       alteplase (CATHFLO ACTIVASE) injection 2 mg  2 mg Intracatheter Once PRN Annice Needy, MD       alum & mag hydroxide-simeth (MAALOX/MYLANTA) 200-200-20 MG/5ML  suspension 30 mL  30 mL Oral Daily PRN Annice Needy, MD   30 mL at 10/27/22 0214   anticoagulant sodium citrate solution 5 mL  5 mL Intracatheter PRN Annice Needy, MD       benztropine (COGENTIN) tablet 2 mg  2 mg Oral BID Esaw Grandchild A, DO   2 mg at 10/29/22 1026   bismuth subsalicylate (PEPTO BISMOL) 262 MG/15ML suspension 15 mL  15 mL Oral Q4H PRN Annice Needy, MD       busPIRone (BUSPAR) tablet 15 mg  15 mg Oral BID Esaw Grandchild A, DO   15 mg at 10/29/22 1027   Chlorhexidine Gluconate Cloth 2 % PADS 6 each  6 each Topical Daily Annice Needy, MD   6 each at 10/29/22 1032   cloZAPine (CLOZARIL) tablet 12.5 mg  12.5 mg Oral BID Esaw Grandchild A, DO   12.5 mg at 10/29/22 1021   divalproex (DEPAKOTE ER) 24 hr tablet 1,000 mg  1,000 mg Oral QHS Esaw Grandchild A, DO   1,000 mg at 10/28/22 2214   doxazosin (CARDURA) tablet 4 mg  4 mg Oral Daily Esaw Grandchild A, DO   4 mg at 10/29/22 1026   feeding supplement (NEPRO CARB STEADY) liquid 237 mL  237 mL Oral TID BM Annice Needy, MD   237 mL at 10/28/22 2213   guaiFENesin (ROBITUSSIN) 100 MG/5ML liquid 200 mg  200 mg Oral Q6H PRN Annice Needy, MD       heparin injection 1,000 Units  1,000 Units Intracatheter PRN Annice Needy, MD       lactulose (CHRONULAC) 10 GM/15ML solution 30 g  30 g Oral QHS Annice Needy, MD   30 g at 10/27/22 2108   levothyroxine (SYNTHROID) tablet 75 mcg  75 mcg Oral Q0600 Esaw Grandchild A, DO   75 mcg at 10/29/22 0629   lidocaine (PF) (XYLOCAINE) 1 % injection 5 mL  5 mL Intradermal PRN Annice Needy, MD       lidocaine (XYLOCAINE) 1 % (with pres) injection 10 mL  10 mL Intradermal Once Annice Needy, MD       lidocaine-prilocaine (EMLA) cream 1 Application  1 Application Topical PRN Annice Needy, MD       loperamide (IMODIUM) capsule 2 mg  2 mg Oral PRN Annice Needy, MD   2 mg at 10/29/22 1027   LORazepam (ATIVAN) injection 0.5-1 mg  0.5-1 mg Intravenous Q4H PRN Annice Needy, MD   1 mg at 10/28/22 2337   magnesium  hydroxide (MILK OF MAGNESIA) suspension 30 mL  30 mL Oral Daily PRN Annice Needy, MD       magnesium hydroxide (MILK OF MAGNESIA) suspension 30 mL  30 mL Oral Daily PRN Annice Needy, MD       meclizine (ANTIVERT) tablet 25 mg  25 mg Oral TID PRN Dew,  Marlow Baars, MD       methocarbamol (ROBAXIN) tablet 500 mg  500 mg Oral Q8H PRN Annice Needy, MD       morphine (PF) 2 MG/ML injection 2 mg  2 mg Intravenous Q4H PRN Annice Needy, MD   2 mg at 10/26/22 0244   multivitamin (RENA-VIT) tablet 1 tablet  1 tablet Oral QHS Annice Needy, MD   1 tablet at 10/28/22 2214   nicotine (NICODERM CQ - dosed in mg/24 hours) patch 21 mg  21 mg Transdermal Daily Annice Needy, MD   21 mg at 10/26/22 0957   ondansetron (ZOFRAN) tablet 4 mg  4 mg Oral Q6H PRN Annice Needy, MD       Oral care mouth rinse  15 mL Mouth Rinse 4 times per day Annice Needy, MD   15 mL at 10/29/22 1122   Oral care mouth rinse  15 mL Mouth Rinse PRN Annice Needy, MD       oxyCODONE-acetaminophen (PERCOCET/ROXICET) 5-325 MG per tablet 1 tablet  1 tablet Oral Q4H PRN Annice Needy, MD   1 tablet at 10/27/22 0157   pantoprazole (PROTONIX) EC tablet 40 mg  40 mg Oral Daily Esaw Grandchild A, DO   40 mg at 10/29/22 1027   pentafluoroprop-tetrafluoroeth (GEBAUERS) aerosol 1 Application  1 Application Topical PRN Annice Needy, MD       polyethylene glycol (MIRALAX / GLYCOLAX) packet 17 g  17 g Oral Daily PRN Annice Needy, MD       sodium chloride (OCEAN) 0.65 % nasal spray 1 spray  1 spray Each Nare PRN Annice Needy, MD       traZODone (DESYREL) tablet 50 mg  50 mg Oral QHS Esaw Grandchild A, DO   50 mg at 10/28/22 2214   valbenazine (INGREZZA) capsule 80 mg  80 mg Oral Daily Esaw Grandchild A, DO   80 mg at 10/29/22 1026     Discharge Medications: Please see discharge summary for a list of discharge medications.  Relevant Imaging Results:  Relevant Lab Results:   Additional Information SS#: [id number]. New HD. Nephrology is unsure if he will  need outpatient or not. Will provide update once available. From [facility]. DSS is pursuing guardianship but stepdaughter can make decisions in the meantime. May need LTC after rehab. He has Medicaid.  Margarito Liner, LCSW

## 2022-10-30 DIAGNOSIS — R6521 Severe sepsis with septic shock: Secondary | ICD-10-CM | POA: Diagnosis not present

## 2022-10-30 DIAGNOSIS — A419 Sepsis, unspecified organism: Secondary | ICD-10-CM | POA: Diagnosis not present

## 2022-10-30 LAB — RENAL FUNCTION PANEL
Albumin: 2.2 g/dL — ABNORMAL LOW (ref 3.5–5.0)
Anion gap: 8 (ref 5–15)
BUN: 64 mg/dL — ABNORMAL HIGH (ref 8–23)
CO2: 29 mmol/L (ref 22–32)
Calcium: 8.2 mg/dL — ABNORMAL LOW (ref 8.9–10.3)
Chloride: 98 mmol/L (ref 98–111)
Creatinine, Ser: 5.34 mg/dL — ABNORMAL HIGH (ref 0.61–1.24)
GFR, Estimated: 11 mL/min — ABNORMAL LOW (ref >60)
Glucose, Bld: 89 mg/dL (ref 70–99)
Phosphorus: 4 mg/dL (ref 2.5–4.6)
Potassium: 4.1 mmol/L (ref 3.5–5.1)
Sodium: 135 mmol/L (ref 135–145)

## 2022-10-30 LAB — CBC
HCT: 24 % — ABNORMAL LOW (ref 39.0–52.0)
Hemoglobin: 8.2 g/dL — ABNORMAL LOW (ref 13.0–17.0)
MCH: 30.6 pg (ref 26.0–34.0)
MCHC: 34.2 g/dL (ref 30.0–36.0)
MCV: 89.6 fL (ref 80.0–100.0)
Platelets: 122 10*3/uL — ABNORMAL LOW (ref 150–400)
RBC: 2.68 MIL/uL — ABNORMAL LOW (ref 4.22–5.81)
RDW: 15.7 % — ABNORMAL HIGH (ref 11.5–15.5)
WBC: 12.1 10*3/uL — ABNORMAL HIGH (ref 4.0–10.5)
nRBC: 0 % (ref 0.0–0.2)

## 2022-10-30 MED ORDER — MORPHINE SULFATE (PF) 2 MG/ML IV SOLN
INTRAVENOUS | Status: AC
  Filled 2022-10-30: qty 1

## 2022-10-30 MED ORDER — CLOZAPINE 25 MG PO TABS
25.0000 mg | ORAL_TABLET | Freq: Two times a day (BID) | ORAL | Status: DC
  Administered 2022-10-30 – 2022-11-04 (×9): 25 mg via ORAL
  Filled 2022-10-30 (×10): qty 1

## 2022-10-30 MED ORDER — HEPARIN SODIUM (PORCINE) 1000 UNIT/ML IJ SOLN
INTRAMUSCULAR | Status: AC
  Filled 2022-10-30: qty 3

## 2022-10-30 MED ORDER — HEPARIN SODIUM (PORCINE) 1000 UNIT/ML IJ SOLN
INTRAMUSCULAR | Status: AC
  Filled 2022-10-30: qty 1

## 2022-10-30 NOTE — Progress Notes (Signed)
Pharmacy Consult - Clozapine     67 yo M ordered clozapine 100 mg PO qAM / 200 mg qPM   This patient's order has been reviewed for prescribing contraindications.    Clozapine REMS enrollment Verified: Yes DATE: 10/28/22 REMS patient ID: [id number] REMS Dispense Authorization (RDA): [id number] Current Outpatient Monitoring: Monthly    Home Regimen: 100 mg PO qAM / 200 mg PO qPM Last dose: 7/5 PM   Dose Adjustments This Admission: 7/11 Clozapine re-initiated at 12.5 mg BID 7/13 Clozapine 12.5 > 25 mg BID   Labs: Date    ANC    Submitted? 10/23/22 4700 Yes   Plan: Continue with dose increase to 25 mg BID Next ANC scheduled for 10/31/22 Monitor ANC at least weekly while inpatient

## 2022-10-30 NOTE — Procedures (Signed)
Tried to transfer patient to the dialysis recliner but patient was too weak. Patient is doing HD tx on the bed. Dr. Wynelle Link was made aware.

## 2022-10-30 NOTE — Progress Notes (Signed)
Progress Note   Patient: Austin Marsh ZOX:[id number] DOB: 24-Nov-1955 DOA: 10/16/2022     13 DOS: the patient was seen and examined on 10/30/2022      Brief hospital course: "Katron Scherzer is a 67 y.o. male with medical history significant of COPD, hypothyroidism, depression with anxiety, BPH, CKD-3A, schizoaffective disorder with bipolar, tardive dyskinesia, OSA on CPAP, who presents with fall and AMS."  Caregiver at family care facility.  reported pt was in his normal health condition until 10:30 PM yesterday when pt had fall and was found on the floor. At normal baseline, patient is alert oriented x 3 but today is confused.  On admission, pt was lethargic, oriented to person and place, not to year or situation.  He reported right hip pain with moving right leg, low back pain, some chest pain which he was unable to characterize further, constipation, suprapubic pain.  He was found to have acute urinary retention (973 cc on bladder scan).  Foley catheter was placed.     In the ED - fever of 101, HR 123, RR 26, stable O2 sat on room air.  Per EMS report, patient had low BP, but at time of admission BP was stable at 124/73.   Chest x-ray negative.   CT of head negative for acute intracranial abnormalities.   CT Chest/Abd/Pelvis: Moderate coronary artery calcification. No acute intrathoracic or intra-abdominal pathology identified. Stable 9 mm right adrenal adenoma. Stable changes of adrenal hyperplasia. Marked distention of the bladder possibly reflecting changes of bladder outlet obstruction. Mild prostatic hypertrophy.  Probable post TURP changes.  X-ray of bilateral hips/pelvis negative for fracture.   X-ray of L-spine and T-spine negative for acute fracture, did showed degenerative disc disease    Notable labs - WBC 11.2, severe AKI with Cr 7.78, sodium 123, negative COVID PCR, lactic acid> 9.0 ---> 4.9, troponin level 172, 406, 804, 784.  Sodium 123, magnesium 2.3,  Hemoglobin 11.5.   Pt was  admitted to PCU. Subsequently found to have severe rhabdomyolysis with CK peaking above 50k. IV heparin was started and cardiology consulted due to troponin elevation.   7/1 -- pt hypotensive, requiring IV fluid boluses.  Worsening encephalopathy, Hbg dropped to 6.3, large area of ecchymosis and swelling in the left neck & upper shoulder concerning for hematoma, later confirmed on CT check and neck soft tissue.  Pt was transferred to stepdown for developing shock. PCCM consulted.  Transfused 2 units pRBC's, 1 FFP.   7/2 -- BP's improved after 3 units pRBC's, 1 FFP total.  Hbg improved from nadir 5.4 to 10.1 >> 9.3 this AM.  +Blood cultures with GPC's and GPR's.  ID consulted.  CK trending down. Nephrology consulted with worsening renal function and poor urine output reported.   7/3-7/8 patient's renal function continues to worsen and she continues to remain altered nephrologist made decision for hemodialysis initiation.  Patient's wife is in the psych unit and according to psychiatry lacks capacity to make decision for patient.   Assessment and Plan:     Acute kidney injury (HCC)-worsening Stage 3a CKD: Baseline Cr 1.17 on 06/04/2022.  Cr on admission 7.78 AKI likely due to severe rhabdomyolysis, sepsis, dehydration, urinary retention secondary to bladder outlet obstruction. Case discussed with nephrology team Avoid nephrotoxic medications Sodium bicarb drip discontinued Had dialysis catheter placement on 10/23/2022 Hemodialysis was initiated on patient on 10/23/2022 R internal jugular Permcath placed 7/10    * Septic shock (HCC)-improved On admission, tachycardic, tachypneic, febrile. Lactic acid was above  9 meeting septic shock criteria (with dehydration, AKI, rhabdo contributing).  Hypotension seemed more due to acute blood loss 2/2 left neck hematoma.  BP responded to fluids, blood products and midodrine, did not require pressors. Has bacteremia and RLL pneumonia suspect acute aspiration on  7/1 while encephalopathic  Patient completed a course of Zyvox. Antibiotics discontinued by infectious disease. Continue to monitor closely Infectious disease on board   Acute respiratory failure with hypoxia (HCC) Right lower lobe pneumonia, not POA, suspect acute aspiration event on 7/1. Antibiotics completed and discontinued by ID on 10/22/2022 Weaned off O2 and stable on room air  7/12 --- RN reported choking spell during breakfast --Low threshold to repeat chest xray, will hold off for now & monitor  --Robitussin PRN   Bacteremia due to Staphylococcus Initial blood cx +staph epidermidis felt contaminant.  Has completed antibiotics and discontinued by ID on 10/22/2022 Infectious disease on board we appreciate input   Hematoma-resolved Hematoma Left Neck/Shoulder  Acute Blood Loss Anemia Hypotension due to above and ?septic shock 7/1 - Hbg dropped from 11.5 >> 6.3 and pt noted to have new swelling and ecchymosis at the left neck & upper shoulder.  7/11 - Hbg 6.9 - transfused 1 unit pRBC's (4th total) --Heparin was stopped  --Transfused total 4 units pRBC's and 1 FFP --Transferred to stepdown 7/1 --PCCM consulted for pressors if needed - signed off, BP's stabilized Continue to monitor CBC closely   Rhabdomyolysis-improved CK peaked > 50000 Given aggressive IV hydration for this and hypotension, then devoped hypoxia on 7/1, concern for pulmonary edema but found to have RLL collapse.  Fluid held as patient requiring dialysis now CK normalized on 7/12, 373   Acute metabolic encephalopathy CT head on admission negative.  Multifactorial due to metabolic derangements, sepsis / bacteremia, underlying psych conditions Home psych meds resumed  now that he can take PO meds Continue delirium precautions -As needed Ativan for severe agitation otherwise we will avoid benzodiazepine use   Acute retention of urine Hx of BPH Foley placed on admission. Blood pressure improved however  patient unable to take his oral Cardura Resume doxazosin  7/13 -- dc foley, voiding trial Bladder scans to monitor   NSTEMI (non-ST elevated myocardial infarction) (HCC) Elevated troponin - demand ischemia & delayed clearance with severe AKI. -Cardiology was consulted, signed off --IV heparin stopped 7/1 AM with acute anemia and hematoma Continue to monitor closely   Fall Unknown cause, pt was found on the floor at his family care home, per caregiver's history on admission. --PT OT evaluation when clinically improved --Imaging was negative for acute injuries / fractures --Neck hematoma developed 7/1 Confirmed on CT chest and neck soft tissue obtained later on 7/1 PM   Hyponatremia- resolved Sodium 124 on 10/21/2022 Saline fluids stopped due to hypoxia on 7/1 Monitor sodium level closely Nephrology following Monitor BMP's   Schizoaffective disorder, bipolar type (HCC) Depression with anxiety --Resume home Benztropine, BuSpar, clozapine, Depakote, Ingrezza (held while unable to take PO meds) --Allergy listed to haldol, will use IV Ativan PRN if agitation  --Clozaril resumed at 12.5 mg BID given it was held > 2 days -- pt tolerating well --Increase Clozaril to 25 mg BID --Continue titrating Clozaril dose to home dose of 100 mg qAM, 200 mg qPM  COPD (chronic obstructive pulmonary disease) (HCC) Stable Continue PRN bronchodilators and Mucine  Hypothyroidism Continue Synthroid  Tobacco abuse Nicotine patch ordered       Subjective / Interval history Pt seen during dialysis  today.  In good spirits      Disposition: Needs SNF  Barrier/s to d/c: Needs to transition to dialysis in chair for outpatient dialysis to be arranged.   Status is: Inpatient Remains inpatient appropriate because: continues to require hemodialysis      Physical examination:   General exam: awake, talkative, no acute distress, confused HEENT: left neck and upper shoulder ecchymosis stable,  moist mucus membranes, hearing grossly normal  Respiratory system: CTAB, no wheezes, rales or rhonchi, normal respiratory effort. Cardiovascular system:R internal jugular dialysis catheter, RRR, trace pedal edema.   Gastrointestinal system: soft, NT, ND Central nervous system: A&O x self at least. no gross focal neurologic deficits, normal speech, follow commands Extremities: 1+ BLE edema, moves all  Skin: dry, intact, normal temperature Psychiatry: normal mood, congruent affect, confused       Data Reviewed: Notable labs --- BUN 64, Cr 5.34, Ca 8.2, Hbg 6.9 >> 8.2, platelets 122k improving, WBC 12.1k  CK normalized 373 on 7/12    Family Communication: None  Per Dr. Meriam Sprague 10/26/22: "Spoke with case management who also spoke with patient's group home and apparently patient's may have a sister.  We are trying to get in touch with patient's sister to help with decision making   Patient's wife is in the psych unit and according to psychiatry lacks capacity to make decision for patient."         Author: Pennie Banter, DO 10/30/2022 1:16 PM  For on call review [email or web address].

## 2022-10-30 NOTE — Progress Notes (Signed)
[facility]  ROUNDING NOTE   Subjective:   Seen and examined on hemodialysis treatment. Patient treated in bed. Patient too weak to transfer to chair.   Patient with mittens, seems to have intermittent confusion.     HEMODIALYSIS FLOWSHEET:  Blood Flow Rate (mL/min): 400 mL/min Arterial Pressure (mmHg): -180 mmHg Venous Pressure (mmHg): 170 mmHg TMP (mmHg): 4 mmHg Ultrafiltration Rate (mL/min): 686 mL/min Dialysate Flow Rate (mL/min): 300 ml/min Dialysis Fluid Bolus: Normal Saline    Objective:  Vital signs in last 24 hours:  Temp:  [97.8 F (36.6 C)-99 F (37.2 C)] 98.2 F (36.8 C) (07/13 0850) Pulse Rate:  [73-85] 73 (07/13 1130) Resp:  [15-24] 17 (07/13 1130) BP: (131-166)/(70-86) 158/76 (07/13 1130) SpO2:  [94 %-100 %] 95 % (07/13 1130) Weight:  [98.5 kg-104 kg] 98.5 kg (07/13 0850)  Weight change: 6.3 kg Filed Weights   10/29/22 0500 10/30/22 0304 10/30/22 0850  Weight: 98 kg 104 kg 98.5 kg    Intake/Output: I/O last 3 completed shifts: In: 628.8 [P.O.:237; I.V.:21.8; Blood:370] Out: 725 [Urine:725]   Intake/Output this shift:  No intake/output data recorded.  Physical Exam: General: No acute distress  Head: Normocephalic, atraumatic. moist oral mucosal membranes  Eyes: Anicteric  Lungs:  Scattered rhonchi, normal effort  Heart: Regular rate and rhythm  Abdomen:  Soft, nontender  Extremities: 2+ generalized edema.  Neurologic: Awake, alert  Skin: No rashes  Access: Rt IJ permcath  GU Foley     Basic Metabolic Panel: Recent Labs  Lab 10/26/22 0419 10/27/22 0418 10/28/22 0408 10/29/22 0635 10/30/22 0911  NA 133* 136 139 137 135  K 4.4 3.8 4.1 3.8 4.1  CL 90* 96* 98 100 98  CO2 27 30 31 29 29   GLUCOSE 78 110* 124* 100* 89  BUN 76* 56* 73* 48* 64*  CREATININE 6.31* 4.93* 5.97* 4.39* 5.34*  CALCIUM 7.3* 8.0* 8.4* 8.3* 8.2*  MG  --  2.0 2.1 1.7  --   PHOS  --  5.2* 5.8* 3.2 4.0    Liver Function Tests: Recent Labs  Lab  10/30/22 0911  ALBUMIN 2.2*   No results for input(s): "LIPASE", "AMYLASE" in the last 168 hours.  No results for input(s): "AMMONIA" in the last 168 hours.  CBC: Recent Labs  Lab 10/24/22 0615 10/28/22 0935 10/29/22 0635 10/30/22 0615  WBC 6.7 9.2 11.8* 12.1*  NEUTROABS 4.0  --   --   --   HGB 7.9* 7.1* 6.9* 8.2*  HCT 23.2* 21.4* 20.4* 24.0*  MCV 90.6 94.3 91.9 89.6  PLT 72* 82* 93* 122*    Cardiac Enzymes: Recent Labs  Lab 10/25/22 0546 10/26/22 0419 10/27/22 0418 10/28/22 0408 10/29/22 0635  CKTOTAL 830* 881* 694* 415* 373    BNP: Invalid input(s): "POCBNP"  CBG: No results for input(s): "GLUCAP" in the last 168 hours.   Microbiology: Results for orders placed or performed during the hospital encounter of 10/16/22  Blood Culture (routine x 2)     Status: None (Preliminary result)   Collection Time: 10/16/22 11:44 PM   Specimen: BLOOD LEFT ARM  Result Value Ref Range Status   Specimen Description   Final    BLOOD LEFT ARM Performed at [facility], [address], [place], [place] [address]    Special Requests   Final    BOTTLES DRAWN AEROBIC AND ANAEROBIC Blood Culture adequate volume Performed at [facility], [address] [place], [place] [address]    Culture  Setup  Time   Final    GRAM POSTIVE ORGANISM AEROBIC BOTTLE ONLY CRITICAL VALUE NOTED.  VALUE IS CONSISTENT WITH PREVIOUSLY REPORTED AND CALLED VALUE.    Culture   Final    GRAM POSITIVE RODS SENT TO [facility] FOR ID Performed at [facility], [address], [place], [place] [address]    Report Status PENDING  Incomplete  Blood Culture (routine x 2)     Status: Abnormal (Preliminary result)   Collection Time: 10/16/22 11:44 PM   Specimen: BLOOD RIGHT ARM  Result Value Ref Range Status   Specimen Description   Final    BLOOD RIGHT ARM Performed at [facility], [address], [place], [place] [address]    Special Requests   Final    BOTTLES  DRAWN AEROBIC AND ANAEROBIC Blood Culture adequate volume Performed at [facility], [address] [place], [place] [address]    Culture  Setup Time   Final    GRAM POSITIVE COCCI ANAEROBIC BOTTLE ONLY CRITICAL RESULT CALLED TO, READ BACK BY AND VERIFIED WITH: NATHAN BELUE @ 2253 10/17/22 [facility] GRAM POSITIVE RODS AEROBIC BOTTLE ONLY CRITICAL RESULT CALLED TO, READ BACK BY AND VERIFIED WITH: JASON ROBBINS@0310  10/19/22 [facility]    Culture (A)  Final    STAPHYLOCOCCUS EPIDERMIDIS THE SIGNIFICANCE OF ISOLATING THIS ORGANISM FROM A SINGLE SET OF BLOOD CULTURES WHEN MULTIPLE SETS ARE DRAWN IS UNCERTAIN. PLEASE NOTIFY THE MICROBIOLOGY DEPARTMENT WITHIN ONE WEEK IF SPECIATION AND SENSITIVITIES ARE REQUIRED. GRAM POSITIVE RODS SENT TO [facility] FOR ID AND SUSCEPTIBILITIES Performed at [facility], [address], [place], [place] [address]    Report Status PENDING  Incomplete  SARS Coronavirus 2 by RT PCR (hospital order, performed in [facility] hospital lab) *cepheid single result test* Anterior Nasal Swab     Status: None   Collection Time: 10/16/22 11:44 PM   Specimen: Anterior Nasal Swab  Result Value Ref Range Status   SARS Coronavirus 2 by RT PCR NEGATIVE NEGATIVE Final    Comment: (NOTE) SARS-CoV-2 target nucleic acids are NOT DETECTED.  The SARS-CoV-2 RNA is generally detectable in upper and lower respiratory specimens during the acute phase of infection. The lowest concentration of SARS-CoV-2 viral copies this assay can detect is 250 copies / mL. A negative result does not preclude SARS-CoV-2 infection and should not be used as the sole basis for treatment or other patient management decisions.  A negative result may occur with improper specimen collection / handling, submission of specimen other than nasopharyngeal swab, presence of viral mutation(s) within the areas targeted by this assay, and inadequate number of viral copies (<250 copies / mL). A negative result must  be combined with clinical observations, patient history, and epidemiological information.  Fact Sheet for Patients:   RoadLapTop.co.za  Fact Sheet for Healthcare Providers: http://kim-miller.com/  This test is not yet approved or  cleared by the [place] FDA and has been authorized for detection and/or diagnosis of SARS-CoV-2 by FDA under an Emergency Use Authorization (EUA).  This EUA will remain in effect (meaning this test can be used) for the duration of the COVID-19 declaration under Section 564(b)(1) of the Act, 21 U.S.C. section 360bbb-3(b)(1), unless the authorization is terminated or revoked sooner.  Performed at [facility], [address], [place], [place] [address]   Blood Culture ID Panel (Reflexed)     Status: Abnormal   Collection Time: 10/16/22 11:44 PM  Result Value Ref Range Status   Enterococcus  faecalis NOT DETECTED NOT DETECTED Final   Enterococcus Faecium NOT DETECTED NOT DETECTED Final   Listeria monocytogenes NOT DETECTED NOT DETECTED Final   Staphylococcus species DETECTED (A) NOT DETECTED Final    Comment: CRITICAL RESULT CALLED TO, READ BACK BY AND VERIFIED WITH: NATHAN BELUE @ 2253 10/17/22 [facility]    Staphylococcus aureus (BCID) NOT DETECTED NOT DETECTED Final   Staphylococcus epidermidis DETECTED (A) NOT DETECTED Final    Comment: Methicillin (oxacillin) resistant coagulase negative staphylococcus. Possible blood culture contaminant (unless isolated from more than one blood culture draw or clinical case suggests pathogenicity). No antibiotic treatment is indicated for blood  culture contaminants. CRITICAL RESULT CALLED TO, READ BACK BY AND VERIFIED WITH: NATHAN BELUE @ 2253 10/17/22 [facility]    Staphylococcus lugdunensis NOT DETECTED NOT DETECTED Final   Streptococcus species NOT DETECTED NOT DETECTED Final   Streptococcus agalactiae NOT DETECTED NOT DETECTED Final   Streptococcus pneumoniae NOT  DETECTED NOT DETECTED Final   Streptococcus pyogenes NOT DETECTED NOT DETECTED Final   A.calcoaceticus-baumannii NOT DETECTED NOT DETECTED Final   Bacteroides fragilis NOT DETECTED NOT DETECTED Final   Enterobacterales NOT DETECTED NOT DETECTED Final   Enterobacter cloacae complex NOT DETECTED NOT DETECTED Final   Escherichia coli NOT DETECTED NOT DETECTED Final   Klebsiella aerogenes NOT DETECTED NOT DETECTED Final   Klebsiella oxytoca NOT DETECTED NOT DETECTED Final   Klebsiella pneumoniae NOT DETECTED NOT DETECTED Final   Proteus species NOT DETECTED NOT DETECTED Final   Salmonella species NOT DETECTED NOT DETECTED Final   Serratia marcescens NOT DETECTED NOT DETECTED Final   Haemophilus influenzae NOT DETECTED NOT DETECTED Final   Neisseria meningitidis NOT DETECTED NOT DETECTED Final   Pseudomonas aeruginosa NOT DETECTED NOT DETECTED Final   Stenotrophomonas maltophilia NOT DETECTED NOT DETECTED Final   Candida albicans NOT DETECTED NOT DETECTED Final   Candida auris NOT DETECTED NOT DETECTED Final   Candida glabrata NOT DETECTED NOT DETECTED Final   Candida krusei NOT DETECTED NOT DETECTED Final   Candida parapsilosis NOT DETECTED NOT DETECTED Final   Candida tropicalis NOT DETECTED NOT DETECTED Final   Cryptococcus neoformans/gattii NOT DETECTED NOT DETECTED Final   Methicillin resistance mecA/C DETECTED (A) NOT DETECTED Final    Comment: CRITICAL RESULT CALLED TO, READ BACK BY AND VERIFIED WITHHarrold Donath BELUE @ 2253 10/17/22 [facility] Performed at [facility], [address] [place], [place] [address]   Respiratory (~20 pathogens) panel by PCR     Status: None   Collection Time: 10/17/22  9:26 AM   Specimen: Nasopharyngeal Swab; Respiratory  Result Value Ref Range Status   Adenovirus NOT DETECTED NOT DETECTED Final   Coronavirus 229E NOT DETECTED NOT DETECTED Final    Comment: (NOTE) The Coronavirus on the Respiratory Panel, DOES NOT test for the novel  Coronavirus  (2019 nCoV)    Coronavirus HKU1 NOT DETECTED NOT DETECTED Final   Coronavirus NL63 NOT DETECTED NOT DETECTED Final   Coronavirus OC43 NOT DETECTED NOT DETECTED Final   Metapneumovirus NOT DETECTED NOT DETECTED Final   Rhinovirus / Enterovirus NOT DETECTED NOT DETECTED Final   Influenza A NOT DETECTED NOT DETECTED Final   Influenza B NOT DETECTED NOT DETECTED Final   Parainfluenza Virus 1 NOT DETECTED NOT DETECTED Final   Parainfluenza Virus 2 NOT DETECTED NOT DETECTED Final   Parainfluenza Virus 3 NOT DETECTED NOT DETECTED Final   Parainfluenza Virus 4 NOT DETECTED NOT DETECTED Final   Respiratory Syncytial Virus NOT DETECTED NOT DETECTED Final  Bordetella pertussis NOT DETECTED NOT DETECTED Final   Bordetella Parapertussis NOT DETECTED NOT DETECTED Final   Chlamydophila pneumoniae NOT DETECTED NOT DETECTED Final   Mycoplasma pneumoniae NOT DETECTED NOT DETECTED Final    Comment: Performed at [facility], [address], [place], [place] [address]  MRSA Next Gen by PCR, Nasal     Status: None   Collection Time: 10/18/22 10:53 AM   Specimen: Nasal Mucosa; Nasal Swab  Result Value Ref Range Status   MRSA by PCR Next Gen NOT DETECTED NOT DETECTED Final    Comment: (NOTE) The GeneXpert MRSA Assay (FDA approved for NASAL specimens only), is one component of a comprehensive MRSA colonization surveillance program. It is not intended to diagnose MRSA infection nor to guide or monitor treatment for MRSA infections. Test performance is not FDA approved in patients less than 42 years old. Performed at [facility], [address] [place], [place] [address]     Coagulation Studies: No results for input(s): "LABPROT", "INR" in the last 72 hours.   Urinalysis: No results for input(s): "COLORURINE", "LABSPEC", "PHURINE", "GLUCOSEU", "HGBUR", "BILIRUBINUR", "KETONESUR", "PROTEINUR", "UROBILINOGEN", "NITRITE", "LEUKOCYTESUR" in the last 72 hours.  Invalid input(s):  "APPERANCEUR"    Imaging: No results found.   Medications:    anticoagulant sodium citrate      benztropine  2 mg Oral BID   busPIRone  15 mg Oral BID   Chlorhexidine Gluconate Cloth  6 each Topical Daily   cloZAPine  12.5 mg Oral BID   divalproex  1,000 mg Oral QHS   doxazosin  4 mg Oral Daily   feeding supplement (NEPRO CARB STEADY)  237 mL Oral TID BM   lactulose  30 g Oral QHS   levothyroxine  75 mcg Oral Q0600   lidocaine  10 mL Intradermal Once   multivitamin  1 tablet Oral QHS   nicotine  21 mg Transdermal Daily   mouth rinse  15 mL Mouth Rinse 4 times per day   pantoprazole  40 mg Oral Daily   traZODone  50 mg Oral QHS   valbenazine  80 mg Oral Daily   acetaminophen **OR** acetaminophen, albuterol, alteplase, alum & mag hydroxide-simeth, anticoagulant sodium citrate, bismuth subsalicylate, guaiFENesin, heparin, lidocaine (PF), lidocaine-prilocaine, loperamide, LORazepam, magnesium hydroxide, magnesium hydroxide, meclizine, methocarbamol, morphine injection, ondansetron **OR** [DISCONTINUED] ondansetron (ZOFRAN) IV, mouth rinse, oxyCODONE-acetaminophen, pentafluoroprop-tetrafluoroeth, polyethylene glycol, sodium chloride  Assessment/ Plan:  Mr. Austin Marsh is a 67 y.o.  male with past medical history of hypothyroidism, depression with anxiety, schizoaffective, and COPD, who was admitted to [facility] on 10/16/2022 for Lactic acidosis [E87.20] Elevated troponin level [R79.89] Demand ischemia [I24.89] NSTEMI (non-ST elevated myocardial infarction) (HCC) [I21.4] Acute kidney injury (HCC) [N17.9] Septic shock (HCC) [A41.9, R65.21] Sepsis due to undetermined organism (HCC) [A41.9] Altered mental status, unspecified altered mental status type [R41.82] SIRS (systemic inflammatory response syndrome) (HCC) [R65.10]   Acute kidney injury secondary to rhabdomyolysis.  Baseline creatinine of 1.17, GFR >60 on 06/04/2022. Requiring hemodialysis.   - Monitor for renal recovery.   Lab  Results  Component Value Date   CREATININE 5.34 (H) 10/30/2022   CREATININE 4.39 (H) 10/29/2022   CREATININE 5.97 (H) 10/28/2022    Intake/Output Summary (Last 24 hours) at 10/30/2022 1211 Last data filed at 10/30/2022 1103 Gross per 24 hour  Intake 391.83 ml  Output 200 ml  Net 191.83 ml   2. Anemia of acute blood loss:  Lab Results  Component Value Date   HGB 8.2 (  L) 10/30/2022  - EPO with HD treatments.   3. Hypertension: elevated 158/76 on treatment. Regimen currently of doxazosin.  - may need to add a second agent.   4. Hyponatremia: resolved       LOS: 13 Therisa Mennella 7/13/202412:11 PM

## 2022-10-30 NOTE — TOC Progression Note (Signed)
Transition of Care [facility]) - Progression Note    Patient Details  Name: Austin Marsh MRN: [id number] Date of Birth: 1955/06/28  Transition of Care [facility]) CM/SW Contact  Kemper Durie, RN Phone Number: 10/30/2022, 2:44 PM  Clinical Narrative:     Spoke with daughter Lamar Laundry, presented bed offers.  [facility] chosen.       Expected Discharge Plan and Services                                               Social Determinants of Health (SDOH) Interventions SDOH Screenings   Food Insecurity: No Food Insecurity (10/17/2022)  Housing: Patient Unable To Answer (10/17/2022)  Tobacco Use: High Risk (10/16/2022)    Readmission Risk Interventions    03/17/2020    1:48 PM  Readmission Risk Prevention Plan  Transportation Screening Complete  PCP or Specialist Appt within 3-5 Days --  HRI or Home Care Consult --  Palliative Care Screening Not Applicable  Medication Review (RN Care Manager) Complete

## 2022-10-30 NOTE — Procedures (Signed)
Received patient in bed to unit.  Alert and oriented.  Informed consent signed and in chart.   TX duration: 3.5hrs  Patient tolerated well.  Transported back to the room  Alert, without acute distress.  Hand-off given to patient's nurse.   Access used: Right chest HD catheter.  Access issues: NONE  Total UF removed: 1500 ml Medication(s) given: Morphine 2mg  IV    [facility]

## 2022-10-31 DIAGNOSIS — A419 Sepsis, unspecified organism: Secondary | ICD-10-CM | POA: Diagnosis not present

## 2022-10-31 DIAGNOSIS — R6521 Severe sepsis with septic shock: Secondary | ICD-10-CM | POA: Diagnosis not present

## 2022-10-31 LAB — CBC WITH DIFFERENTIAL/PLATELET
Abs Immature Granulocytes: 0.31 10*3/uL — ABNORMAL HIGH (ref 0.00–0.07)
Basophils Absolute: 0.1 10*3/uL (ref 0.0–0.1)
Basophils Relative: 0 %
Eosinophils Absolute: 0.2 10*3/uL (ref 0.0–0.5)
Eosinophils Relative: 2 %
HCT: 23.2 % — ABNORMAL LOW (ref 39.0–52.0)
Hemoglobin: 8.1 g/dL — ABNORMAL LOW (ref 13.0–17.0)
Immature Granulocytes: 2 %
Lymphocytes Relative: 18 %
Lymphs Abs: 2.4 10*3/uL (ref 0.7–4.0)
MCH: 31.4 pg (ref 26.0–34.0)
MCHC: 34.9 g/dL (ref 30.0–36.0)
MCV: 89.9 fL (ref 80.0–100.0)
Monocytes Absolute: 1.5 10*3/uL — ABNORMAL HIGH (ref 0.1–1.0)
Monocytes Relative: 11 %
Neutro Abs: 9.1 10*3/uL — ABNORMAL HIGH (ref 1.7–7.7)
Neutrophils Relative %: 67 %
Platelets: 139 10*3/uL — ABNORMAL LOW (ref 150–400)
RBC: 2.58 MIL/uL — ABNORMAL LOW (ref 4.22–5.81)
RDW: 15.6 % — ABNORMAL HIGH (ref 11.5–15.5)
WBC: 13.5 10*3/uL — ABNORMAL HIGH (ref 4.0–10.5)
nRBC: 0 % (ref 0.0–0.2)

## 2022-10-31 LAB — CULTURE, BLOOD (ROUTINE X 2)
Special Requests: ADEQUATE
Special Requests: ADEQUATE

## 2022-10-31 NOTE — Progress Notes (Addendum)
Progress Note   Patient: Austin Marsh WVP:[id number] DOB: 23-Sep-1955 DOA: 10/16/2022     14 DOS: the patient was seen and examined on 10/31/2022      Brief hospital course: "Silviano Catania is a 67 y.o. male with medical history significant of COPD, hypothyroidism, depression with anxiety, BPH, CKD-3A, schizoaffective disorder with bipolar, tardive dyskinesia, OSA on CPAP, who presents with fall and AMS."  Caregiver at family care facility.  reported pt was in his normal health condition until 10:30 PM yesterday when pt had fall and was found on the floor. At normal baseline, patient is alert oriented x 3 but today is confused.  On admission, pt was lethargic, oriented to person and place, not to year or situation.  He reported right hip pain with moving right leg, low back pain, some chest pain which he was unable to characterize further, constipation, suprapubic pain.  He was found to have acute urinary retention (973 cc on bladder scan).  Foley catheter was placed.     In the ED - fever of 101, HR 123, RR 26, stable O2 sat on room air.  Per EMS report, patient had low BP, but at time of admission BP was stable at 124/73.   Chest x-ray negative.   CT of head negative for acute intracranial abnormalities.   CT Chest/Abd/Pelvis: Moderate coronary artery calcification. No acute intrathoracic or intra-abdominal pathology identified. Stable 9 mm right adrenal adenoma. Stable changes of adrenal hyperplasia. Marked distention of the bladder possibly reflecting changes of bladder outlet obstruction. Mild prostatic hypertrophy.  Probable post TURP changes.  X-ray of bilateral hips/pelvis negative for fracture.   X-ray of L-spine and T-spine negative for acute fracture, did showed degenerative disc disease    Notable labs - WBC 11.2, severe AKI with Cr 7.78, sodium 123, negative COVID PCR, lactic acid> 9.0 ---> 4.9, troponin level 172, 406, 804, 784.  Sodium 123, magnesium 2.3,  Hemoglobin 11.5.   Pt was  admitted to PCU. Subsequently found to have severe rhabdomyolysis with CK peaking above 50k. IV heparin was started and cardiology consulted due to troponin elevation.   7/1 -- pt hypotensive, requiring IV fluid boluses.  Worsening encephalopathy, Hbg dropped to 6.3, large area of ecchymosis and swelling in the left neck & upper shoulder concerning for hematoma, later confirmed on CT check and neck soft tissue.  Pt was transferred to stepdown for developing shock. PCCM consulted.  Transfused 2 units pRBC's, 1 FFP.   7/2 -- BP's improved after 3 units pRBC's, 1 FFP total.  Hbg improved from nadir 5.4 to 10.1 >> 9.3 this AM.  +Blood cultures with GPC's and GPR's.  ID consulted.  CK trending down. Nephrology consulted with worsening renal function and poor urine output reported.   7/3-7/8 patient's renal function continues to worsen and she continues to remain altered nephrologist made decision for hemodialysis initiation.  Patient's wife is in the psych unit and according to psychiatry lacks capacity to make decision for patient.   Assessment and Plan:     Acute kidney injury (HCC)-worsening Stage 3a CKD: Baseline Cr 1.17 on 06/04/2022.  Cr on admission 7.78 AKI likely due to severe rhabdomyolysis, sepsis, dehydration, urinary retention secondary to bladder outlet obstruction. Case discussed with nephrology team Avoid nephrotoxic medications Sodium bicarb drip discontinued Had dialysis catheter placement on 10/23/2022 Hemodialysis was initiated on patient on 10/23/2022 R internal jugular Permcath placed 7/10    * Septic shock (HCC)-improved On admission, tachycardic, tachypneic, febrile. Lactic acid was above  9 meeting septic shock criteria (with dehydration, AKI, rhabdo contributing).  Hypotension seemed more due to acute blood loss 2/2 left neck hematoma.  BP responded to fluids, blood products and midodrine, did not require pressors. Has bacteremia and RLL pneumonia suspect acute aspiration on  7/1 while encephalopathic  Patient completed a course of Zyvox. Antibiotics discontinued by infectious disease. Continue to monitor closely Infectious disease on board  Leukocytosis -- WBC up past few days, 12.1 >> 13.5 this AM (7/14) No fevers or s/sx's of infection Monitor CBC Monitor closely for signs of infection No respiratory symptoms, but low threshold to repeat a chest xray   Acute respiratory failure with hypoxia (HCC) Right lower lobe pneumonia, not POA, suspect acute aspiration event on 7/1. Antibiotics completed and discontinued by ID on 10/22/2022 Weaned off O2 and stable on room air  7/12 --- RN reported choking spell during breakfast --Low threshold to repeat chest xray, will hold off for now & monitor  --Robitussin PRN   Bacteremia due to Staphylococcus Initial blood cx +staph epidermidis felt contaminant.  Has completed antibiotics and discontinued by ID on 10/22/2022 Infectious disease on board we appreciate input   Hematoma-resolved Hematoma Left Neck/Shoulder  Acute Blood Loss Anemia Hypotension due to above and ?septic shock 7/1 - Hbg dropped from 11.5 >> 6.3 and pt noted to have new swelling and ecchymosis at the left neck & upper shoulder.  7/11 - Hbg 6.9 - transfused 1 unit pRBC's (4th total) --Heparin was stopped  --Transfused total 4 units pRBC's and 1 FFP --Transferred to stepdown 7/1 --PCCM consulted for pressors if needed - signed off, BP's stabilized Continue to monitor CBC closely   Rhabdomyolysis-improved CK peaked > 50000 Given aggressive IV hydration for this and hypotension, then devoped hypoxia on 7/1, concern for pulmonary edema but found to have RLL collapse.  Fluid held as patient requiring dialysis now CK normalized on 7/12, 373   Acute metabolic encephalopathy CT head on admission negative.  Multifactorial due to metabolic derangements, sepsis / bacteremia, underlying psych conditions Home psych meds resumed  now that he can take PO  meds Continue delirium precautions -As needed Ativan for severe agitation otherwise we will avoid benzodiazepine use   Acute retention of urine - resolved Hx of BPH Anuria due to renal failure Foley placed on admission. Blood pressure improved however patient unable to take his oral Cardura Resume doxazosin  7/13 -- dc foley, voiding trial In/out cath was done overnight, only 60 cc yielded 7/14 -- not voiding, but minimal urine on bladder scans Bladder scans to monitor   NSTEMI (non-ST elevated myocardial infarction) (HCC) Elevated troponin - demand ischemia & delayed clearance with severe AKI. -Cardiology was consulted, signed off --IV heparin stopped 7/1 AM with acute anemia and hematoma Continue to monitor closely   Fall Unknown cause, pt was found on the floor at his family care home, per caregiver's history on admission. --PT OT evaluation when clinically improved --Imaging was negative for acute injuries / fractures --Neck hematoma developed 7/1 Confirmed on CT chest and neck soft tissue obtained later on 7/1 PM   Hyponatremia- resolved Sodium 124 on 10/21/2022 Saline fluids stopped due to hypoxia on 7/1 Monitor sodium level closely Nephrology following Monitor BMP's   Schizoaffective disorder, bipolar type (HCC) Depression with anxiety --Resume home Benztropine, BuSpar, clozapine, Depakote, Ingrezza (held while unable to take PO meds) --Allergy listed to haldol, will use IV Ativan PRN if agitation  --Clozaril resumed at 12.5 mg BID given duration it  was held  --Increased Clozaril to 25 mg BID (7/13) --Continue titrating Clozaril dose to home dose of 100 mg qAM, 200 mg qPM --Pharmacy on board with resuming Clozaril --Monitor ANC  COPD (chronic obstructive pulmonary disease) (HCC) Stable Continue PRN bronchodilators and Mucine  Hypothyroidism Continue Synthroid  Tobacco abuse Nicotine patch ordered       Subjective / Interval history Pt seen this AM with  NT's at bedside.  He is wearing mittens at his request, they have not been needed for agitation or pulling at lines.  He says they keep his hands warm and asks for them to be replaced if staff removes them.  He denies any complaints today.  Encouraged him to try being up in the chair today.      Disposition: Needs SNF  Barrier/s to d/c: Needs to transition to dialysis in chair for outpatient dialysis to be arranged.   Status is: Inpatient Remains inpatient appropriate because: continues to require hemodialysis      Physical examination:   General exam: awake, talkative, no acute distress, confused HEENT: left neck and upper shoulder ecchymosis stable, moist mucus membranes, hearing grossly normal  Respiratory system: CTAB, no wheezes, rales or rhonchi, normal respiratory effort. Cardiovascular system:R internal jugular dialysis catheter, RRR, trace pedal edema.   Gastrointestinal system: soft, NT, ND Central nervous system: A&O x self at least. no gross focal neurologic deficits, normal speech, follow commands Extremities: 1+ BLE edema, mittens on b/l hands  Skin: dry, intact, normal temperature Psychiatry: normal mood, congruent affect, confused       Data Reviewed: Notable labs ---  BUN 64, Cr 5.34, Ca 8.2. WBC 13.5k, Hbg stable 8.1, platelets improving 139k  CK normalized 373 on 7/12    Family Communication: None  Per Dr. Meriam Sprague 10/26/22: "Spoke with case management who also spoke with patient's group home and apparently patient's may have a sister.  We are trying to get in touch with patient's sister to help with decision making   Patient's wife is in the psych unit and according to psychiatry lacks capacity to make decision for patient."         Author: Pennie Banter, DO 10/31/2022 1:33 PM  For on call review [email or web address].

## 2022-10-31 NOTE — Progress Notes (Signed)
Pharmacy Consult - Clozapine     67 yo M ordered clozapine 100 mg PO qAM / 200 mg qPM   This patient's order has been reviewed for prescribing contraindications.    Clozapine [facility] enrollment Verified: Yes DATE: 10/28/22 [facility] patient ID: [id number] [facility] Dispense Authorization (RDA): [id number] Current Outpatient Monitoring: Monthly    Home Regimen: 100 mg PO qAM / 200 mg PO qPM Last dose: 7/5 PM   Dose Adjustments This Admission: 7/11 Clozapine re-initiated at 12.5 mg BID 7/13 Clozapine 12.5 > 25 mg BID   Labs: Date     ANC     Submitted? 10/23/22 4700  Yes 10/31/22 9100  Yes   Plan: Continue with dose increase to 25 mg BID Next ANC scheduled for 11/07/22 Monitor ANC at least weekly while inpatient

## 2022-10-31 NOTE — Progress Notes (Deleted)
[facility]  ROUNDING NOTE   Subjective:   Patient seen and evaluated during dialysis   HEMODIALYSIS FLOWSHEET:  Blood Flow Rate (mL/min): 400 mL/min Arterial Pressure (mmHg): -150 mmHg Venous Pressure (mmHg): 140 mmHg TMP (mmHg): 6 mmHg Ultrafiltration Rate (mL/min): 686 mL/min Dialysate Flow Rate (mL/min): 300 ml/min Dialysis Fluid Bolus: Normal Saline  Tolerating treatment well seated in chair   Objective:  Vital signs in last 24 hours:  Temp:  [98 F (36.7 C)-98.6 F (37 C)] 98.3 F (36.8 C) (07/14 0811) Pulse Rate:  [70-79] 77 (07/14 0811) Resp:  [16-25] 16 (07/14 0811) BP: (124-158)/(69-85) 145/85 (07/14 0811) SpO2:  [95 %-100 %] 100 % (07/14 0811) Weight:  [97 kg-98.3 kg] 97.2 kg (07/14 1027)  Weight change: -5.5 kg Filed Weights   10/30/22 1301 10/31/22 0832 10/31/22 1027  Weight: 97 kg 98.3 kg 97.2 kg    Intake/Output: I/O last 3 completed shifts: In: 120 [P.O.:120] Out: 1700 [Urine:200; Other:1500]   Intake/Output this shift:  No intake/output data recorded.  Physical Exam: General: No acute distress  Head: Normocephalic, atraumatic. moist oral mucosal membranes  Eyes: Anicteric  Lungs:  Scattered rhonchi, normal effort  Heart: Regular rate and rhythm  Abdomen:  Soft, nontender  Extremities: 2+ generalized edema.  Neurologic: Awake, alert  Skin: No rashes  Access: Rt IJ permcath  GU Foley     Basic Metabolic Panel: Recent Labs  Lab 10/26/22 0419 10/27/22 0418 10/28/22 0408 10/29/22 0635 10/30/22 0911  NA 133* 136 139 137 135  K 4.4 3.8 4.1 3.8 4.1  CL 90* 96* 98 100 98  CO2 27 30 31 29 29   GLUCOSE 78 110* 124* 100* 89  BUN 76* 56* 73* 48* 64*  CREATININE 6.31* 4.93* 5.97* 4.39* 5.34*  CALCIUM 7.3* 8.0* 8.4* 8.3* 8.2*  MG  --  2.0 2.1 1.7  --   PHOS  --  5.2* 5.8* 3.2 4.0    Liver Function Tests: Recent Labs  Lab 10/30/22 0911  ALBUMIN 2.2*   No results for input(s): "LIPASE", "AMYLASE" in the last 168  hours.  No results for input(s): "AMMONIA" in the last 168 hours.  CBC: Recent Labs  Lab 10/28/22 0935 10/29/22 0635 10/30/22 0615 10/31/22 0523  WBC 9.2 11.8* 12.1* 13.5*  NEUTROABS  --   --   --  9.1*  HGB 7.1* 6.9* 8.2* 8.1*  HCT 21.4* 20.4* 24.0* 23.2*  MCV 94.3 91.9 89.6 89.9  PLT 82* 93* 122* 139*    Cardiac Enzymes: Recent Labs  Lab 10/25/22 0546 10/26/22 0419 10/27/22 0418 10/28/22 0408 10/29/22 0635  CKTOTAL 830* 881* 694* 415* 373    BNP: Invalid input(s): "POCBNP"  CBG: No results for input(s): "GLUCAP" in the last 168 hours.   Microbiology: Results for orders placed or performed during the hospital encounter of 10/16/22  Blood Culture (routine x 2)     Status: None (Preliminary result)   Collection Time: 10/16/22 11:44 PM   Specimen: BLOOD LEFT ARM  Result Value Ref Range Status   Specimen Description   Final    BLOOD LEFT ARM Performed at [facility], [address] [place], [place] [address]    Special Requests   Final    BOTTLES DRAWN AEROBIC AND ANAEROBIC Blood Culture adequate volume Performed at [facility], [address], [place], [place] [address]    Culture  Setup Time   Final    GRAM POSTIVE ORGANISM AEROBIC BOTTLE ONLY CRITICAL VALUE NOTED.  VALUE  IS CONSISTENT WITH PREVIOUSLY REPORTED AND CALLED VALUE.    Culture   Final    GRAM POSITIVE RODS SENT TO [facility] FOR ID Performed at [facility], [address] [place], [place] [address]    Report Status PENDING  Incomplete  Blood Culture (routine x 2)     Status: Abnormal (Preliminary result)   Collection Time: 10/16/22 11:44 PM   Specimen: BLOOD RIGHT ARM  Result Value Ref Range Status   Specimen Description   Final    BLOOD RIGHT ARM Performed at [facility], [address], [place], [place] [address]    Special Requests   Final    BOTTLES DRAWN AEROBIC AND ANAEROBIC Blood Culture adequate volume Performed at [facility],  [address] [place], [place] [address]    Culture  Setup Time   Final    GRAM POSITIVE COCCI ANAEROBIC BOTTLE ONLY CRITICAL RESULT CALLED TO, READ BACK BY AND VERIFIED WITH: NATHAN BELUE @ 2253 10/17/22 [facility] GRAM POSITIVE RODS AEROBIC BOTTLE ONLY CRITICAL RESULT CALLED TO, READ BACK BY AND VERIFIED WITH: JASON ROBBINS@0310  10/19/22 [facility]    Culture (A)  Final    STAPHYLOCOCCUS EPIDERMIDIS THE SIGNIFICANCE OF ISOLATING THIS ORGANISM FROM A SINGLE SET OF BLOOD CULTURES WHEN MULTIPLE SETS ARE DRAWN IS UNCERTAIN. PLEASE NOTIFY THE MICROBIOLOGY DEPARTMENT WITHIN ONE WEEK IF SPECIATION AND SENSITIVITIES ARE REQUIRED. GRAM POSITIVE RODS SENT TO [facility] FOR ID AND SUSCEPTIBILITIES Performed at [facility], [address] [place], [place] [address]    Report Status PENDING  Incomplete  SARS Coronavirus 2 by RT PCR (hospital order, performed in [facility] hospital lab) *cepheid single result test* Anterior Nasal Swab     Status: None   Collection Time: 10/16/22 11:44 PM   Specimen: Anterior Nasal Swab  Result Value Ref Range Status   SARS Coronavirus 2 by RT PCR NEGATIVE NEGATIVE Final    Comment: (NOTE) SARS-CoV-2 target nucleic acids are NOT DETECTED.  The SARS-CoV-2 RNA is generally detectable in upper and lower respiratory specimens during the acute phase of infection. The lowest concentration of SARS-CoV-2 viral copies this assay can detect is 250 copies / mL. A negative result does not preclude SARS-CoV-2 infection and should not be used as the sole basis for treatment or other patient management decisions.  A negative result may occur with improper specimen collection / handling, submission of specimen other than nasopharyngeal swab, presence of viral mutation(s) within the areas targeted by this assay, and inadequate number of viral copies (<250 copies / mL). A negative result must be combined with clinical observations, patient history, and epidemiological  information.  Fact Sheet for Patients:   RoadLapTop.co.za  Fact Sheet for Healthcare Providers: http://kim-miller.com/  This test is not yet approved or  cleared by the [place] FDA and has been authorized for detection and/or diagnosis of SARS-CoV-2 by FDA under an Emergency Use Authorization (EUA).  This EUA will remain in effect (meaning this test can be used) for the duration of the COVID-19 declaration under Section 564(b)(1) of the Act, 21 U.S.C. section 360bbb-3(b)(1), unless the authorization is terminated or revoked sooner.  Performed at [facility], [address] [place], [place] [address]   Blood Culture ID Panel (Reflexed)     Status: Abnormal   Collection Time: 10/16/22 11:44 PM  Result Value Ref Range Status   Enterococcus faecalis NOT DETECTED NOT DETECTED Final   Enterococcus Faecium NOT DETECTED NOT DETECTED Final   Listeria  monocytogenes NOT DETECTED NOT DETECTED Final   Staphylococcus species DETECTED (A) NOT DETECTED Final    Comment: CRITICAL RESULT CALLED TO, READ BACK BY AND VERIFIED WITH: NATHAN BELUE @ 2253 10/17/22 [facility]    Staphylococcus aureus (BCID) NOT DETECTED NOT DETECTED Final   Staphylococcus epidermidis DETECTED (A) NOT DETECTED Final    Comment: Methicillin (oxacillin) resistant coagulase negative staphylococcus. Possible blood culture contaminant (unless isolated from more than one blood culture draw or clinical case suggests pathogenicity). No antibiotic treatment is indicated for blood  culture contaminants. CRITICAL RESULT CALLED TO, READ BACK BY AND VERIFIED WITH: NATHAN BELUE @ 2253 10/17/22 [facility]    Staphylococcus lugdunensis NOT DETECTED NOT DETECTED Final   Streptococcus species NOT DETECTED NOT DETECTED Final   Streptococcus agalactiae NOT DETECTED NOT DETECTED Final   Streptococcus pneumoniae NOT DETECTED NOT DETECTED Final   Streptococcus pyogenes NOT DETECTED NOT DETECTED  Final   A.calcoaceticus-baumannii NOT DETECTED NOT DETECTED Final   Bacteroides fragilis NOT DETECTED NOT DETECTED Final   Enterobacterales NOT DETECTED NOT DETECTED Final   Enterobacter cloacae complex NOT DETECTED NOT DETECTED Final   Escherichia coli NOT DETECTED NOT DETECTED Final   Klebsiella aerogenes NOT DETECTED NOT DETECTED Final   Klebsiella oxytoca NOT DETECTED NOT DETECTED Final   Klebsiella pneumoniae NOT DETECTED NOT DETECTED Final   Proteus species NOT DETECTED NOT DETECTED Final   Salmonella species NOT DETECTED NOT DETECTED Final   Serratia marcescens NOT DETECTED NOT DETECTED Final   Haemophilus influenzae NOT DETECTED NOT DETECTED Final   Neisseria meningitidis NOT DETECTED NOT DETECTED Final   Pseudomonas aeruginosa NOT DETECTED NOT DETECTED Final   Stenotrophomonas maltophilia NOT DETECTED NOT DETECTED Final   Candida albicans NOT DETECTED NOT DETECTED Final   Candida auris NOT DETECTED NOT DETECTED Final   Candida glabrata NOT DETECTED NOT DETECTED Final   Candida krusei NOT DETECTED NOT DETECTED Final   Candida parapsilosis NOT DETECTED NOT DETECTED Final   Candida tropicalis NOT DETECTED NOT DETECTED Final   Cryptococcus neoformans/gattii NOT DETECTED NOT DETECTED Final   Methicillin resistance mecA/C DETECTED (A) NOT DETECTED Final    Comment: CRITICAL RESULT CALLED TO, READ BACK BY AND VERIFIED WITH: NATHAN BELUE @ 2253 10/17/22 [facility] Performed at [facility], [address] [place], [place] [address]   Respiratory (~20 pathogens) panel by PCR     Status: None   Collection Time: 10/17/22  9:26 AM   Specimen: Nasopharyngeal Swab; Respiratory  Result Value Ref Range Status   Adenovirus NOT DETECTED NOT DETECTED Final   Coronavirus 229E NOT DETECTED NOT DETECTED Final    Comment: (NOTE) The Coronavirus on the Respiratory Panel, DOES NOT test for the novel  Coronavirus (2019 nCoV)    Coronavirus HKU1 NOT DETECTED NOT DETECTED Final   Coronavirus  NL63 NOT DETECTED NOT DETECTED Final   Coronavirus OC43 NOT DETECTED NOT DETECTED Final   Metapneumovirus NOT DETECTED NOT DETECTED Final   Rhinovirus / Enterovirus NOT DETECTED NOT DETECTED Final   Influenza A NOT DETECTED NOT DETECTED Final   Influenza B NOT DETECTED NOT DETECTED Final   Parainfluenza Virus 1 NOT DETECTED NOT DETECTED Final   Parainfluenza Virus 2 NOT DETECTED NOT DETECTED Final   Parainfluenza Virus 3 NOT DETECTED NOT DETECTED Final   Parainfluenza Virus 4 NOT DETECTED NOT DETECTED Final   Respiratory Syncytial Virus NOT DETECTED NOT DETECTED Final   Bordetella pertussis NOT DETECTED NOT DETECTED Final   Bordetella Parapertussis NOT DETECTED NOT DETECTED Final  Chlamydophila pneumoniae NOT DETECTED NOT DETECTED Final   Mycoplasma pneumoniae NOT DETECTED NOT DETECTED Final    Comment: Performed at [facility], [address] [place], [place] [address]  MRSA Next Gen by PCR, Nasal     Status: None   Collection Time: 10/18/22 10:53 AM   Specimen: Nasal Mucosa; Nasal Swab  Result Value Ref Range Status   MRSA by PCR Next Gen NOT DETECTED NOT DETECTED Final    Comment: (NOTE) The GeneXpert MRSA Assay (FDA approved for NASAL specimens only), is one component of a comprehensive MRSA colonization surveillance program. It is not intended to diagnose MRSA infection nor to guide or monitor treatment for MRSA infections. Test performance is not FDA approved in patients less than 7 years old. Performed at [facility], [address] [place], [place] [address]     Coagulation Studies: No results for input(s): "LABPROT", "INR" in the last 72 hours.   Urinalysis: No results for input(s): "COLORURINE", "LABSPEC", "PHURINE", "GLUCOSEU", "HGBUR", "BILIRUBINUR", "KETONESUR", "PROTEINUR", "UROBILINOGEN", "NITRITE", "LEUKOCYTESUR" in the last 72 hours.  Invalid input(s): "APPERANCEUR"    Imaging: No results found.   Medications:    anticoagulant  sodium citrate      benztropine  2 mg Oral BID   busPIRone  15 mg Oral BID   Chlorhexidine Gluconate Cloth  6 each Topical Daily   cloZAPine  25 mg Oral BID   divalproex  1,000 mg Oral QHS   doxazosin  4 mg Oral Daily   feeding supplement (NEPRO CARB STEADY)  237 mL Oral TID BM   lactulose  30 g Oral QHS   levothyroxine  75 mcg Oral Q0600   lidocaine  10 mL Intradermal Once   multivitamin  1 tablet Oral QHS   nicotine  21 mg Transdermal Daily   mouth rinse  15 mL Mouth Rinse 4 times per day   pantoprazole  40 mg Oral Daily   traZODone  50 mg Oral QHS   valbenazine  80 mg Oral Daily   acetaminophen **OR** acetaminophen, albuterol, alteplase, alum & mag hydroxide-simeth, anticoagulant sodium citrate, bismuth subsalicylate, guaiFENesin, heparin, lidocaine (PF), lidocaine-prilocaine, loperamide, LORazepam, magnesium hydroxide, magnesium hydroxide, meclizine, methocarbamol, morphine injection, ondansetron **OR** [DISCONTINUED] ondansetron (ZOFRAN) IV, mouth rinse, oxyCODONE-acetaminophen, pentafluoroprop-tetrafluoroeth, polyethylene glycol, sodium chloride  Assessment/ Plan:  Mr. Austin Marsh is a 67 y.o.  male with past medical history of hypothyroidism, depression with anxiety, schizoaffective, and COPD, who was admitted to [facility] on 10/16/2022 for Lactic acidosis [E87.20] Elevated troponin level [R79.89] Demand ischemia [I24.89] NSTEMI (non-ST elevated myocardial infarction) (HCC) [I21.4] Acute kidney injury (HCC) [N17.9] Septic shock (HCC) [A41.9, R65.21] Sepsis due to undetermined organism (HCC) [A41.9] Altered mental status, unspecified altered mental status type [R41.82] SIRS (systemic inflammatory response syndrome) (HCC) [R65.10]   Acute kidney injury secondary to rhabdomyolysis.  Baseline creatinine of 1.17, GFR >60 on 06/04/2022. Requiring hemodialysis.   - Receiving dialysis treatment today  - Seated in chair  - Next treatment scheduled for Wednesday  - Renal navigator  seeking outpatient dialysis clinic.   Lab Results  Component Value Date   CREATININE 5.34 (H) 10/30/2022   CREATININE 4.39 (H) 10/29/2022   CREATININE 5.97 (H) 10/28/2022    Intake/Output Summary (Last 24 hours) at 10/31/2022 1110 Last data filed at 10/30/2022 1403 Gross per 24 hour  Intake 120 ml  Output 1500 ml  Net -1380 ml   2. Anemia of acute blood loss:  Lab Results  Component Value Date  HGB 8.1 (L) 10/31/2022  - Hgb below optimal range - EPO with HD treatments.   3. Hypertension:  Regimen currently of doxazosin.  - Blood pressure 159/68 during dialysis - acceptable at this time  4. Hyponatremia: resolved       LOS: 14 Austin Marsh 7/14/202411:10 AM

## 2022-10-31 NOTE — Progress Notes (Signed)
Voiding trial: Patient complained of discomfort and urge to void, but was able to produce about of urine. Bladder scan was unsuccessful. Dr Arville Care was made aware and patient had a straight cath done. He produced of amber colored urine.

## 2022-11-01 DIAGNOSIS — R6521 Severe sepsis with septic shock: Secondary | ICD-10-CM | POA: Diagnosis not present

## 2022-11-01 DIAGNOSIS — A419 Sepsis, unspecified organism: Secondary | ICD-10-CM | POA: Diagnosis not present

## 2022-11-01 LAB — BACTERIAL ORGANISM REFLEX

## 2022-11-01 LAB — RENAL FUNCTION PANEL
Albumin: 2.7 g/dL — ABNORMAL LOW (ref 3.5–5.0)
Anion gap: 8 (ref 5–15)
BUN: 58 mg/dL — ABNORMAL HIGH (ref 8–23)
CO2: 28 mmol/L (ref 22–32)
Calcium: 8.6 mg/dL — ABNORMAL LOW (ref 8.9–10.3)
Chloride: 97 mmol/L — ABNORMAL LOW (ref 98–111)
Creatinine, Ser: 4.78 mg/dL — ABNORMAL HIGH (ref 0.61–1.24)
GFR, Estimated: 13 mL/min — ABNORMAL LOW (ref >60)
Glucose, Bld: 97 mg/dL (ref 70–99)
Phosphorus: 5.2 mg/dL — ABNORMAL HIGH (ref 2.5–4.6)
Potassium: 4.5 mmol/L (ref 3.5–5.1)
Sodium: 133 mmol/L — ABNORMAL LOW (ref 135–145)

## 2022-11-01 LAB — CBC
HCT: 23.5 % — ABNORMAL LOW (ref 39.0–52.0)
Hemoglobin: 7.7 g/dL — ABNORMAL LOW (ref 13.0–17.0)
MCH: 30.4 pg (ref 26.0–34.0)
MCHC: 32.8 g/dL (ref 30.0–36.0)
MCV: 92.9 fL (ref 80.0–100.0)
Platelets: 177 10*3/uL (ref 150–400)
RBC: 2.53 MIL/uL — ABNORMAL LOW (ref 4.22–5.81)
RDW: 15.7 % — ABNORMAL HIGH (ref 11.5–15.5)
WBC: 10.9 10*3/uL — ABNORMAL HIGH (ref 4.0–10.5)
nRBC: 0 % (ref 0.0–0.2)

## 2022-11-01 MED ORDER — EPOETIN ALFA 10000 UNIT/ML IJ SOLN
INTRAMUSCULAR | Status: AC
  Filled 2022-11-01: qty 1

## 2022-11-01 MED ORDER — EPOETIN ALFA 10000 UNIT/ML IJ SOLN
10000.0000 [IU] | INTRAMUSCULAR | Status: DC
  Administered 2022-11-01 – 2022-11-03 (×2): 10000 [IU] via INTRAVENOUS
  Filled 2022-11-01: qty 1

## 2022-11-01 MED ORDER — HEPARIN SODIUM (PORCINE) 1000 UNIT/ML IJ SOLN
INTRAMUSCULAR | Status: AC
  Filled 2022-11-01: qty 10

## 2022-11-01 MED ORDER — ATORVASTATIN CALCIUM 20 MG PO TABS
40.0000 mg | ORAL_TABLET | Freq: Every day | ORAL | Status: DC
  Administered 2022-11-01 – 2022-11-03 (×3): 40 mg via ORAL
  Filled 2022-11-01 (×3): qty 2

## 2022-11-01 MED ORDER — OXYCODONE-ACETAMINOPHEN 5-325 MG PO TABS
ORAL_TABLET | ORAL | Status: AC
  Filled 2022-11-01: qty 1

## 2022-11-01 NOTE — Progress Notes (Signed)
[facility]  ROUNDING NOTE   Subjective:   Patient seen and evaluated during dialysis   HEMODIALYSIS FLOWSHEET:  Blood Flow Rate (mL/min): 400 mL/min Arterial Pressure (mmHg): -150 mmHg Venous Pressure (mmHg): 150 mmHg TMP (mmHg): 4 mmHg Ultrafiltration Rate (mL/min): 686 mL/min Dialysate Flow Rate (mL/min): 300 ml/min Dialysis Fluid Bolus: Normal Saline  Tolerating treatment well seated in chair   Objective:  Vital signs in last 24 hours:  Temp:  [97.2 F (36.2 C)-98 F (36.7 C)] 97.7 F (36.5 C) (07/15 0838) Pulse Rate:  [73-84] 84 (07/15 1200) Resp:  [14-26] 15 (07/15 1200) BP: (122-159)/(61-101) 122/101 (07/15 1200) SpO2:  [93 %-98 %] 98 % (07/15 1200) Weight:  [102 kg] 102 kg (07/15 0303)  Weight change: -0.2 kg Filed Weights   10/31/22 0832 10/31/22 1027 11/01/22 0303  Weight: 98.3 kg 97.2 kg 102 kg    Intake/Output: I/O last 3 completed shifts: In: 360 [P.O.:360] Out: 200 [Urine:200]   Intake/Output this shift:  No intake/output data recorded.  Physical Exam: General: No acute distress  Head: Normocephalic, atraumatic. moist oral mucosal membranes  Eyes: Anicteric  Lungs:  Scattered rhonchi, normal effort  Heart: Regular rate and rhythm  Abdomen:  Soft, nontender  Extremities: 2+ generalized edema.  Neurologic: Awake, alert  Skin: No rashes  Access: Rt IJ permcath  GU Foley     Basic Metabolic Panel: Recent Labs  Lab 10/27/22 0418 10/28/22 0408 10/29/22 0635 10/30/22 0911 11/01/22 0526  NA 136 139 137 135 133*  K 3.8 4.1 3.8 4.1 4.5  CL 96* 98 100 98 97*  CO2 30 31 29 29 28   GLUCOSE 110* 124* 100* 89 97  BUN 56* 73* 48* 64* 58*  CREATININE 4.93* 5.97* 4.39* 5.34* 4.78*  CALCIUM 8.0* 8.4* 8.3* 8.2* 8.6*  MG 2.0 2.1 1.7  --   --   PHOS 5.2* 5.8* 3.2 4.0 5.2*    Liver Function Tests: Recent Labs  Lab 10/30/22 0911 11/01/22 0526  ALBUMIN 2.2* 2.7*   No results for input(s): "LIPASE", "AMYLASE" in the last 168  hours.  No results for input(s): "AMMONIA" in the last 168 hours.  CBC: Recent Labs  Lab 10/28/22 0935 10/29/22 0635 10/30/22 0615 10/31/22 0523 11/01/22 0853  WBC 9.2 11.8* 12.1* 13.5* 10.9*  NEUTROABS  --   --   --  9.1*  --   HGB 7.1* 6.9* 8.2* 8.1* 7.7*  HCT 21.4* 20.4* 24.0* 23.2* 23.5*  MCV 94.3 91.9 89.6 89.9 92.9  PLT 82* 93* 122* 139* 177    Cardiac Enzymes: Recent Labs  Lab 10/26/22 0419 10/27/22 0418 10/28/22 0408 10/29/22 0635  CKTOTAL 881* 694* 415* 373    BNP: Invalid input(s): "POCBNP"  CBG: No results for input(s): "GLUCAP" in the last 168 hours.   Microbiology: Results for orders placed or performed during the hospital encounter of 10/16/22  Blood Culture (routine x 2)     Status: None   Collection Time: 10/16/22 11:44 PM   Specimen: BLOOD LEFT ARM  Result Value Ref Range Status   Specimen Description   Final    BLOOD LEFT ARM Performed at [facility], [address] [place], [place] [address]    Special Requests   Final    BOTTLES DRAWN AEROBIC AND ANAEROBIC Blood Culture adequate volume Performed at [facility], [address], [place], [place] [address]    Culture  Setup Time   Final    GRAM POSTIVE ORGANISM AEROBIC BOTTLE ONLY CRITICAL  VALUE NOTED.  VALUE IS CONSISTENT WITH PREVIOUSLY REPORTED AND CALLED VALUE.    Culture   Final    GRAM POSITIVE RODS SENT TO [facility] FOR ID SEE SEPARATE REPORT Performed at [facility], [address], [place], [place] [address]    Report Status 10/31/2022 FINAL  Final  Blood Culture (routine x 2)     Status: Abnormal   Collection Time: 10/16/22 11:44 PM   Specimen: BLOOD RIGHT ARM  Result Value Ref Range Status   Specimen Description   Final    BLOOD RIGHT ARM Performed at [facility], [address], [place], [place] [address]    Special Requests   Final    BOTTLES DRAWN AEROBIC AND ANAEROBIC Blood Culture adequate volume Performed at [facility], [address], [place], [place] [address]    Culture  Setup Time   Final    GRAM POSITIVE COCCI ANAEROBIC BOTTLE ONLY CRITICAL RESULT CALLED TO, READ BACK BY AND VERIFIED WITH: NATHAN BELUE @ 2253 10/17/22 [facility] GRAM POSITIVE RODS AEROBIC BOTTLE ONLY CRITICAL RESULT CALLED TO, READ BACK BY AND VERIFIED WITH: JASON ROBBINS@0310  10/19/22 [facility]    Culture (A)  Final    STAPHYLOCOCCUS EPIDERMIDIS THE SIGNIFICANCE OF ISOLATING THIS ORGANISM FROM A SINGLE SET OF BLOOD CULTURES WHEN MULTIPLE SETS ARE DRAWN IS UNCERTAIN. PLEASE NOTIFY THE MICROBIOLOGY DEPARTMENT WITHIN ONE WEEK IF SPECIATION AND SENSITIVITIES ARE REQUIRED. GRAM POSITIVE RODS SENT TO [facility] FOR ID AND SUSCEPTIBILITIES SEE SEPARATE REPORT Performed at [facility], [address], [place], [place] [address]    Report Status 10/31/2022 FINAL  Final  SARS Coronavirus 2 by RT PCR (hospital order, performed in [facility] hospital lab) *cepheid single result test* Anterior Nasal Swab     Status: None   Collection Time: 10/16/22 11:44 PM   Specimen: Anterior Nasal Swab  Result Value Ref Range Status   SARS Coronavirus 2 by RT PCR NEGATIVE NEGATIVE Final    Comment: (NOTE) SARS-CoV-2 target nucleic acids are NOT DETECTED.  The SARS-CoV-2 RNA is generally detectable in upper and lower respiratory specimens during the acute phase of infection. The lowest concentration of SARS-CoV-2 viral copies this assay can detect is 250 copies / mL. A negative result does not preclude SARS-CoV-2 infection and should not be used as the sole basis for treatment or other patient management decisions.  A negative result may occur with improper specimen collection / handling, submission of specimen other than nasopharyngeal swab, presence of viral mutation(s) within the areas targeted by this assay, and inadequate number of viral copies (<250 copies / mL). A negative result must be combined with clinical observations, patient  history, and epidemiological information.  Fact Sheet for Patients:   RoadLapTop.co.za  Fact Sheet for Healthcare Providers: http://kim-miller.com/  This test is not yet approved or  cleared by the [place] FDA and has been authorized for detection and/or diagnosis of SARS-CoV-2 by FDA under an Emergency Use Authorization (EUA).  This EUA will remain in effect (meaning this test can be used) for the duration of the COVID-19 declaration under Section 564(b)(1) of the Act, 21 U.S.C. section 360bbb-3(b)(1), unless the authorization is terminated or revoked sooner.  Performed at [facility], [address] [place], [place] [address]   Blood Culture ID Panel (Reflexed)     Status: Abnormal   Collection Time: 10/16/22 11:44 PM  Result Value Ref Range Status   Enterococcus faecalis NOT DETECTED NOT DETECTED Final  Enterococcus Faecium NOT DETECTED NOT DETECTED Final   Listeria monocytogenes NOT DETECTED NOT DETECTED Final   Staphylococcus species DETECTED (A) NOT DETECTED Final    Comment: CRITICAL RESULT CALLED TO, READ BACK BY AND VERIFIED WITH: NATHAN BELUE @ 2253 10/17/22 [facility]    Staphylococcus aureus (BCID) NOT DETECTED NOT DETECTED Final   Staphylococcus epidermidis DETECTED (A) NOT DETECTED Final    Comment: Methicillin (oxacillin) resistant coagulase negative staphylococcus. Possible blood culture contaminant (unless isolated from more than one blood culture draw or clinical case suggests pathogenicity). No antibiotic treatment is indicated for blood  culture contaminants. CRITICAL RESULT CALLED TO, READ BACK BY AND VERIFIED WITH: NATHAN BELUE @ 2253 10/17/22 [facility]    Staphylococcus lugdunensis NOT DETECTED NOT DETECTED Final   Streptococcus species NOT DETECTED NOT DETECTED Final   Streptococcus agalactiae NOT DETECTED NOT DETECTED Final   Streptococcus pneumoniae NOT DETECTED NOT DETECTED Final   Streptococcus  pyogenes NOT DETECTED NOT DETECTED Final   A.calcoaceticus-baumannii NOT DETECTED NOT DETECTED Final   Bacteroides fragilis NOT DETECTED NOT DETECTED Final   Enterobacterales NOT DETECTED NOT DETECTED Final   Enterobacter cloacae complex NOT DETECTED NOT DETECTED Final   Escherichia coli NOT DETECTED NOT DETECTED Final   Klebsiella aerogenes NOT DETECTED NOT DETECTED Final   Klebsiella oxytoca NOT DETECTED NOT DETECTED Final   Klebsiella pneumoniae NOT DETECTED NOT DETECTED Final   Proteus species NOT DETECTED NOT DETECTED Final   Salmonella species NOT DETECTED NOT DETECTED Final   Serratia marcescens NOT DETECTED NOT DETECTED Final   Haemophilus influenzae NOT DETECTED NOT DETECTED Final   Neisseria meningitidis NOT DETECTED NOT DETECTED Final   Pseudomonas aeruginosa NOT DETECTED NOT DETECTED Final   Stenotrophomonas maltophilia NOT DETECTED NOT DETECTED Final   Candida albicans NOT DETECTED NOT DETECTED Final   Candida auris NOT DETECTED NOT DETECTED Final   Candida glabrata NOT DETECTED NOT DETECTED Final   Candida krusei NOT DETECTED NOT DETECTED Final   Candida parapsilosis NOT DETECTED NOT DETECTED Final   Candida tropicalis NOT DETECTED NOT DETECTED Final   Cryptococcus neoformans/gattii NOT DETECTED NOT DETECTED Final   Methicillin resistance mecA/C DETECTED (A) NOT DETECTED Final    Comment: CRITICAL RESULT CALLED TO, READ BACK BY AND VERIFIED WITHDawayne Cirri @ 2253 10/17/22 [facility] Performed at [facility], [address] [place], [place] [address]   Organism ID, Bacteria     Status: None (Preliminary result)   Collection Time: 10/16/22 11:44 PM  Result Value Ref Range Status   Organism ID, Bacteria REFERT  Final    Comment: (NOTE) Please refer to the following specimen for additional lab results. Please refer to spec [phone] for test result. Lisbeth Renshaw notified 11-01-2022 at 09:55 AM. Performed At: [facility] [address] [place],  [place] [id number] Jolene Schimke MD ON:[id number]    Source of Sample PENDING  Incomplete  Bacterial organism reflex     Status: None   Collection Time: 10/16/22 11:44 PM  Result Value Ref Range Status   Bacterial result 1 Comment  Final    Comment: (NOTE) Specimen has been received and testing has been initiated. Performed At: [facility] [address] [place], [place] [id number] Jolene Schimke MD DG:[id number]   Respiratory (~20 pathogens) panel by PCR     Status: None   Collection Time: 10/17/22  9:26 AM   Specimen: Nasopharyngeal Swab; Respiratory  Result Value Ref Range Status   Adenovirus NOT DETECTED NOT DETECTED Final   Coronavirus 229E  NOT DETECTED NOT DETECTED Final    Comment: (NOTE) The Coronavirus on the Respiratory Panel, DOES NOT test for the novel  Coronavirus (2019 nCoV)    Coronavirus HKU1 NOT DETECTED NOT DETECTED Final   Coronavirus NL63 NOT DETECTED NOT DETECTED Final   Coronavirus OC43 NOT DETECTED NOT DETECTED Final   Metapneumovirus NOT DETECTED NOT DETECTED Final   Rhinovirus / Enterovirus NOT DETECTED NOT DETECTED Final   Influenza A NOT DETECTED NOT DETECTED Final   Influenza B NOT DETECTED NOT DETECTED Final   Parainfluenza Virus 1 NOT DETECTED NOT DETECTED Final   Parainfluenza Virus 2 NOT DETECTED NOT DETECTED Final   Parainfluenza Virus 3 NOT DETECTED NOT DETECTED Final   Parainfluenza Virus 4 NOT DETECTED NOT DETECTED Final   Respiratory Syncytial Virus NOT DETECTED NOT DETECTED Final   Bordetella pertussis NOT DETECTED NOT DETECTED Final   Bordetella Parapertussis NOT DETECTED NOT DETECTED Final   Chlamydophila pneumoniae NOT DETECTED NOT DETECTED Final   Mycoplasma pneumoniae NOT DETECTED NOT DETECTED Final    Comment: Performed at [facility], [address] [place], [place] [address]  MRSA Next Gen by PCR, Nasal     Status: None   Collection Time: 10/18/22 10:53 AM   Specimen: Nasal Mucosa; Nasal Swab  Result Value Ref  Range Status   MRSA by PCR Next Gen NOT DETECTED NOT DETECTED Final    Comment: (NOTE) The GeneXpert MRSA Assay (FDA approved for NASAL specimens only), is one component of a comprehensive MRSA colonization surveillance program. It is not intended to diagnose MRSA infection nor to guide or monitor treatment for MRSA infections. Test performance is not FDA approved in patients less than 64 years old. Performed at [facility], [address] [place], [place] [address]     Coagulation Studies: No results for input(s): "LABPROT", "INR" in the last 72 hours.   Urinalysis: No results for input(s): "COLORURINE", "LABSPEC", "PHURINE", "GLUCOSEU", "HGBUR", "BILIRUBINUR", "KETONESUR", "PROTEINUR", "UROBILINOGEN", "NITRITE", "LEUKOCYTESUR" in the last 72 hours.  Invalid input(s): "APPERANCEUR"    Imaging: No results found.   Medications:    anticoagulant sodium citrate      benztropine  2 mg Oral BID   busPIRone  15 mg Oral BID   Chlorhexidine Gluconate Cloth  6 each Topical Daily   cloZAPine  25 mg Oral BID   divalproex  1,000 mg Oral QHS   doxazosin  4 mg Oral Daily   epoetin (EPOGEN/PROCRIT) injection  10,000 Units Intravenous Q M,W,F-HD   feeding supplement (NEPRO CARB STEADY)  237 mL Oral TID BM   lactulose  30 g Oral QHS   levothyroxine  75 mcg Oral Q0600   lidocaine  10 mL Intradermal Once   multivitamin  1 tablet Oral QHS   nicotine  21 mg Transdermal Daily   mouth rinse  15 mL Mouth Rinse 4 times per day   pantoprazole  40 mg Oral Daily   traZODone  50 mg Oral QHS   valbenazine  80 mg Oral Daily   acetaminophen **OR** acetaminophen, albuterol, alteplase, alum & mag hydroxide-simeth, anticoagulant sodium citrate, bismuth subsalicylate, guaiFENesin, heparin, lidocaine (PF), lidocaine-prilocaine, loperamide, LORazepam, magnesium hydroxide, magnesium hydroxide, meclizine, methocarbamol, morphine injection, ondansetron **OR** [DISCONTINUED] ondansetron (ZOFRAN)  IV, mouth rinse, oxyCODONE-acetaminophen, pentafluoroprop-tetrafluoroeth, polyethylene glycol, sodium chloride  Assessment/ Plan:  Mr. Austin Marsh is a 67 y.o.  male with past medical history of hypothyroidism, depression with anxiety, schizoaffective, and COPD, who was admitted to [facility] on 10/16/2022 for Lactic  acidosis [E87.20] Elevated troponin level [R79.89] Demand ischemia [I24.89] NSTEMI (non-ST elevated myocardial infarction) (HCC) [I21.4] Acute kidney injury (HCC) [N17.9] Septic shock (HCC) [A41.9, R65.21] Sepsis due to undetermined organism (HCC) [A41.9] Altered mental status, unspecified altered mental status type [R41.82] SIRS (systemic inflammatory response syndrome) (HCC) [R65.10]   Acute kidney injury secondary to rhabdomyolysis.  Baseline creatinine of 1.17, GFR >60 on 06/04/2022. Requiring hemodialysis.   - Receiving dialysis treatment today  - Seated in chair  - Next treatment scheduled for Wednesday  - Renal navigator seeking outpatient dialysis clinic.   Lab Results  Component Value Date   CREATININE 4.78 (H) 11/01/2022   CREATININE 5.34 (H) 10/30/2022   CREATININE 4.39 (H) 10/29/2022    Intake/Output Summary (Last 24 hours) at 11/01/2022 1214 Last data filed at 11/01/2022 0309 Gross per 24 hour  Intake 240 ml  Output 200 ml  Net 40 ml   2. Anemia of acute blood loss:  Lab Results  Component Value Date   HGB 7.7 (L) 11/01/2022  - Hgb below optimal range - EPO with HD treatments.   3. Hypertension:  Regimen currently of doxazosin.  - Blood pressure 159/68 during dialysis - acceptable at this time  4. Hyponatremia: resolved       LOS: 15   7/15/202412:14 PM

## 2022-11-01 NOTE — Progress Notes (Signed)
Hemodialysis note  Received patient in bed to unit. Alert and oriented.  Informed consent signed and in chart.  Treatment initiated: 0849 Treatment completed: 1230  Patient tolerated well. Transported back to room, alert without acute distress.  Report given to patient's RN.   Access used: Right Chest HD Catheter Access issues: none  Total UF removed: 1.5 L Medication(s) given:  Epogen 10 000 units IV, Oxycodone-Acetaminophen 5/325 mg tab PO  Post HD weight: Unable to obtain weight. Patient can't stand up.   Wolfgang Phoenix [facility]

## 2022-11-01 NOTE — TOC Progression Note (Addendum)
Transition of Care [facility]) - Progression Note    Patient Details  Name: Austin Marsh MRN: [id number] Date of Birth: March 01, 1956  [facility] CM/SW Contact  Margarito Liner, LCSW Phone Number: 11/01/2022, 10:23 AM  Clinical Narrative:  Left message for [facility] admissions coordinator to notify her that stepdaughter had accepted bed offer and find out which HD centers they transport to.  12:38 pm: [facility] transports patients to HD at [facility] or [facility]. They cannot transport on Saturdays or 6 AM. HD coordinator is aware.  Expected Discharge Plan and Services                                               Social Determinants of Health (SDOH) Interventions SDOH Screenings   Food Insecurity: No Food Insecurity (10/17/2022)  Housing: Patient Unable To Answer (10/17/2022)  Tobacco Use: High Risk (10/16/2022)    Readmission Risk Interventions    03/17/2020    1:48 PM  Readmission Risk Prevention Plan  Transportation Screening Complete  PCP or Specialist Appt within 3-5 Days --  HRI or Home Care Consult --  Palliative Care Screening Not Applicable  Medication Review (RN Care Manager) Complete

## 2022-11-01 NOTE — Progress Notes (Signed)
PT Cancellation Note  Patient Details Name: Austin Marsh MRN: [id number] DOB: 11-27-1955   Cancelled Treatment:    Reason Eval/Treat Not Completed: Other (comment). Pt currently out of room for HD. WIll re-attempt another time.   Cheyanne Lamison 11/01/2022, 9:33 AM Elizabeth Palau, PT, DPT, GCS [phone]

## 2022-11-01 NOTE — Discharge Planning (Signed)
Working on [employer] placement at [facility] or [facility] to accommodate SNF placement at [facility].   Dimas Chyle Dialysis Coordinator II  [facility] Cell: [phone] eFax: [phone] [name]

## 2022-11-01 NOTE — Progress Notes (Signed)
Progress Note   Patient: Austin Marsh XBM:[id number] DOB: February 19, 1956 DOA: 10/16/2022     15 DOS: the patient was seen and examined on 11/01/2022      Brief hospital course: "Austin Marsh is a 67 y.o. male with medical history significant of COPD, hypothyroidism, depression with anxiety, BPH, CKD-3A, schizoaffective disorder with bipolar, tardive dyskinesia, OSA on CPAP, who presents with fall and AMS."  Caregiver at family care facility.  reported pt was in his normal health condition until 10:30 PM yesterday when pt had fall and was found on the floor. At normal baseline, patient is alert oriented x 3 but today is confused.  On admission, pt was lethargic, oriented to person and place, not to year or situation.  He reported right hip pain with moving right leg, low back pain, some chest pain which he was unable to characterize further, constipation, suprapubic pain.  He was found to have acute urinary retention (973 cc on bladder scan).  Foley catheter was placed.     In the ED - fever of 101, HR 123, RR 26, stable O2 sat on room air.  Per EMS report, patient had low BP, but at time of admission BP was stable at 124/73.   Chest x-ray negative.   CT of head negative for acute intracranial abnormalities.   CT Chest/Abd/Pelvis: Moderate coronary artery calcification. No acute intrathoracic or intra-abdominal pathology identified. Stable 9 mm right adrenal adenoma. Stable changes of adrenal hyperplasia. Marked distention of the bladder possibly reflecting changes of bladder outlet obstruction. Mild prostatic hypertrophy.  Probable post TURP changes.  X-ray of bilateral hips/pelvis negative for fracture.   X-ray of L-spine and T-spine negative for acute fracture, did showed degenerative disc disease    Notable labs - WBC 11.2, severe AKI with Cr 7.78, sodium 123, negative COVID PCR, lactic acid> 9.0 ---> 4.9, troponin level 172, 406, 804, 784.  Sodium 123, magnesium 2.3,  Hemoglobin 11.5.   Pt was  admitted to PCU. Subsequently found to have severe rhabdomyolysis with CK peaking above 50k. IV heparin was started and cardiology consulted due to troponin elevation.   7/1 -- pt hypotensive, requiring IV fluid boluses.  Worsening encephalopathy, Hbg dropped to 6.3, large area of ecchymosis and swelling in the left neck & upper shoulder concerning for hematoma, later confirmed on CT check and neck soft tissue.  Pt was transferred to stepdown for developing shock. PCCM consulted.  Transfused 2 units pRBC's, 1 FFP.   7/2 -- BP's improved after 3 units pRBC's, 1 FFP total.  Hbg improved from nadir 5.4 to 10.1 >> 9.3 this AM.  +Blood cultures with GPC's and GPR's.  ID consulted.  CK trending down. Nephrology consulted with worsening renal function and poor urine output reported.   7/3-7/8 patient's renal function continues to worsen and Austin Marsh nephrologist made decision for hemodialysis initiation.  Patient's wife is in the psych unit and according to psychiatry lacks capacity to make decision for patient.   Assessment and Plan:     Acute kidney injury (HCC)-worsening Stage 3a CKD: Baseline Cr 1.17 on 06/04/2022.  Cr on admission 7.78 AKI likely due to severe rhabdomyolysis, sepsis, dehydration, urinary retention secondary to bladder outlet obstruction. Case discussed with nephrology team Avoid nephrotoxic medications Sodium bicarb drip discontinued Had dialysis catheter placement on 10/23/2022 Hemodialysis was initiated on patient on 10/23/2022 R internal jugular Permcath placed 7/10    * Septic shock (HCC)-improved On admission, tachycardic, tachypneic, febrile. Lactic acid was above  9 meeting septic shock criteria (with dehydration, AKI, rhabdo contributing).  Hypotension seemed more due to acute blood loss 2/2 left neck hematoma.  BP responded to fluids, blood products and midodrine, did not require pressors. Has bacteremia and RLL pneumonia suspect acute aspiration on  7/1 while encephalopathic  Patient completed a course of Zyvox. Antibiotics discontinued by infectious disease. Continue to monitor closely Infectious disease on board  Leukocytosis -- WBC up past few days, 12.1 >> 13.5 this AM (7/14) No fevers or s/sx's of infection Monitor CBC Monitor closely for signs of infection No respiratory symptoms, but low threshold to repeat a chest xray   Acute respiratory failure with hypoxia (HCC) Right lower lobe pneumonia, not POA, suspect acute aspiration event on 7/1. Antibiotics completed and discontinued by ID on 10/22/2022 Weaned off O2 and stable on room air  7/12 --- RN reported choking spell during breakfast --Low threshold to repeat chest xray, will hold off for now & monitor  --Robitussin PRN   Bacteremia due to Staphylococcus Initial blood cx +staph epidermidis felt contaminant.  Has completed antibiotics and discontinued by ID on 10/22/2022 Infectious disease on board we appreciate input   Hematoma-resolved Hematoma Left Neck/Shoulder  Acute Blood Loss Anemia Hypotension due to above and ?septic shock 7/1 - Hbg dropped from 11.5 >> 6.3 and pt noted to have new swelling and ecchymosis at the left neck & upper shoulder.  7/11 - Hbg 6.9 - transfused 1 unit pRBC's (4th total) --Heparin was stopped  --Transfused total 4 units pRBC's and 1 FFP --Transferred to stepdown 7/1 --PCCM consulted for pressors if needed - signed off, BP's stabilized Continue to monitor CBC closely   Rhabdomyolysis-improved CK peaked > 50000 Given aggressive IV hydration for this and hypotension, then devoped hypoxia on 7/1, concern for pulmonary edema but found to have RLL collapse.  Fluid held as patient requiring dialysis now CK normalized on 7/12, 373   Acute metabolic encephalopathy CT head on admission negative.  Multifactorial due to metabolic derangements, sepsis / bacteremia, underlying psych conditions Home psych meds resumed  now that he can take PO  meds Continue delirium precautions -As needed Ativan for severe agitation otherwise we will avoid benzodiazepine use   Acute retention of urine - resolved Hx of BPH Anuria due to renal failure Foley placed on admission. Blood pressure improved however patient unable to take his oral Cardura Resume doxazosin  7/13 -- dc foley, voiding trial In/out cath was done overnight, only 60 cc yielded 7/14 -- not voiding, but minimal urine on bladder scans Bladder scans to monitor   NSTEMI (non-ST elevated myocardial infarction) (HCC) Elevated troponin - demand ischemia & delayed clearance with severe AKI. -Cardiology was consulted, signed off --IV heparin stopped 7/1 AM with acute anemia and hematoma Continue to monitor closely   Fall Unknown cause, pt was found on the floor at his family care home, per caregiver's history on admission. --PT OT evaluation when clinically improved --Imaging was negative for acute injuries / fractures --Neck hematoma developed 7/1 Confirmed on CT chest and neck soft tissue obtained later on 7/1 PM   Hyponatremia- resolved Sodium 124 on 10/21/2022 Saline fluids stopped due to hypoxia on 7/1 Monitor sodium level closely Nephrology following Monitor BMP's   Schizoaffective disorder, bipolar type (HCC) Depression with anxiety --Resume home Benztropine, BuSpar, clozapine, Depakote, Ingrezza (held while unable to take PO meds) --Allergy listed to haldol, will use IV Ativan PRN if agitation  --Clozaril resumed at 12.5 mg BID given duration it  was held  --Increased Clozaril to 25 mg BID (7/13) --Continue titrating Clozaril dose to home dose of 100 mg qAM, 200 mg qPM --Pharmacy on board with resuming Clozaril --Monitor ANC  COPD (chronic obstructive pulmonary disease) (HCC) Stable Continue PRN bronchodilators and Mucine  Hypothyroidism Continue Synthroid  Tobacco abuse Nicotine patch ordered       Subjective / Interval history Pt seen in dialysis,  up in chair.  He denies acute complaints. No acute events reported.      Disposition: Needs SNF  Barrier/s to d/c: Needs to transition to dialysis in chair for outpatient dialysis to be arranged.   Status is: Inpatient Remains inpatient appropriate because: continues to require hemodialysis      Physical examination:   General exam: awake, no acute distress, confused HEENT: left neck and upper shoulder ecchymosis stable, moist mucus membranes, hearing grossly normal  Respiratory system: CTAB, no wheezes, rales or rhonchi, normal respiratory effort. Cardiovascular system:R internal jugular dialysis catheter, RRR, trace pedal edema.   Gastrointestinal system: soft, NT, ND Central nervous system: A&O x self at least. no gross focal neurologic deficits, normal speech, follow commands Extremities: 1+ BLE edema, moves all extremities  Skin: dry, intact, normal temperature Psychiatry: normal mood, congruent affect, confused       Data Reviewed: Notable labs ---  BUN 58, Cr 4.78, Ca 8.6. WBC 10.9k imporved, Hbg 8.1>>7.7, platelets normalized  CK normalized 373 on 7/12    Family Communication: None  Per Dr. Meriam Sprague 10/26/22: "Spoke with case management who also spoke with patient's group home and apparently patient's may have a sister.  We are trying to get in touch with patient's sister to help with decision making   Patient's wife is in the psych unit and according to psychiatry lacks capacity to make decision for patient."         Author: Pennie Banter, DO 11/01/2022 1:52 PM  For on call review [email or web address].

## 2022-11-01 NOTE — Progress Notes (Signed)
Delay in patient transport. He has been on transport since 0710. Per floor RN, patient had a bowel movement.

## 2022-11-01 NOTE — Plan of Care (Signed)

## 2022-11-01 NOTE — Progress Notes (Addendum)
OT Cancellation Note  Patient Details Name: Austin Marsh MRN: [id number] DOB: 06-26-1955   Cancelled Treatment:    Reason Eval/Treat Not Completed: Other (comment) (Pt off the floor for HD, will re-attempt as able)   Checked back in with pt, pt reporting 10/10 pain. Spouse in room with behavioral health. Due to pt's AMS and pain levels, will defer OT tx at this time and re-attempt tomorrow.   Hazem Kenner L. Catilyn Boggus, OTR/L  11/01/22, 11:32 AM

## 2022-11-02 DIAGNOSIS — A419 Sepsis, unspecified organism: Secondary | ICD-10-CM | POA: Diagnosis not present

## 2022-11-02 DIAGNOSIS — R6521 Severe sepsis with septic shock: Secondary | ICD-10-CM | POA: Diagnosis not present

## 2022-11-02 LAB — CBC
HCT: 24.9 % — ABNORMAL LOW (ref 39.0–52.0)
Hemoglobin: 8.5 g/dL — ABNORMAL LOW (ref 13.0–17.0)
MCH: 31.1 pg (ref 26.0–34.0)
MCHC: 34.1 g/dL (ref 30.0–36.0)
MCV: 91.2 fL (ref 80.0–100.0)
Platelets: 204 10*3/uL (ref 150–400)
RBC: 2.73 MIL/uL — ABNORMAL LOW (ref 4.22–5.81)
RDW: 15.7 % — ABNORMAL HIGH (ref 11.5–15.5)
WBC: 9.2 10*3/uL (ref 4.0–10.5)
nRBC: 0 % (ref 0.0–0.2)

## 2022-11-02 NOTE — Care Management Important Message (Signed)
Important Message  Patient Details  Name: Austin Marsh MRN: [id number] Date of Birth: June 16, 1955   Medicare Important Message Given:  N/A - LOS <3 / Initial given by admissions     Olegario Messier A [name] 11/02/2022, 11:38 AM

## 2022-11-02 NOTE — Progress Notes (Signed)
[facility]  ROUNDING NOTE   Subjective:   Patient sitting up in bed Eating breakfast  Dialysis tolerated well yesterday, seated in chair  Objective:  Vital signs in last 24 hours:  Temp:  [97.7 F (36.5 C)-98.8 F (37.1 C)] 97.8 F (36.6 C) (07/16 0829) Pulse Rate:  [73-84] 83 (07/16 0829) Resp:  [15-21] 18 (07/16 0829) BP: (122-161)/(44-101) 139/93 (07/16 0829) SpO2:  [96 %-98 %] 96 % (07/16 0829) Weight:  [99.9 kg] 99.9 kg (07/16 0500)  Weight change: 1.6 kg Filed Weights   11/01/22 0303 11/02/22 0500  Weight: 102 kg 99.9 kg    Intake/Output: I/O last 3 completed shifts: In: 0  Out: 2250 [Urine:750; Other:1500]   Intake/Output this shift:  No intake/output data recorded.  Physical Exam: General: No acute distress  Head: Normocephalic, atraumatic. moist oral mucosal membranes  Eyes: Anicteric  Lungs:  Clear to auscultation, normal effort  Heart: Regular rate and rhythm  Abdomen:  Soft, nontender  Extremities: 1+ generalized edema.  Neurologic: Awake, alert  Skin: No rashes  Access: Rt IJ permcath        Basic Metabolic Panel: Recent Labs  Lab 10/27/22 0418 10/28/22 0408 10/29/22 0635 10/30/22 0911 11/01/22 0526  NA 136 139 137 135 133*  K 3.8 4.1 3.8 4.1 4.5  CL 96* 98 100 98 97*  CO2 30 31 29 29 28   GLUCOSE 110* 124* 100* 89 97  BUN 56* 73* 48* 64* 58*  CREATININE 4.93* 5.97* 4.39* 5.34* 4.78*  CALCIUM 8.0* 8.4* 8.3* 8.2* 8.6*  MG 2.0 2.1 1.7  --   --   PHOS 5.2* 5.8* 3.2 4.0 5.2*    Liver Function Tests: Recent Labs  Lab 10/30/22 0911 11/01/22 0526  ALBUMIN 2.2* 2.7*   No results for input(s): "LIPASE", "AMYLASE" in the last 168 hours.  No results for input(s): "AMMONIA" in the last 168 hours.  CBC: Recent Labs  Lab 10/29/22 0635 10/30/22 0615 10/31/22 0523 11/01/22 0853 11/02/22 0625  WBC 11.8* 12.1* 13.5* 10.9* 9.2  NEUTROABS  --   --  9.1*  --   --   HGB 6.9* 8.2* 8.1* 7.7* 8.5*  HCT 20.4* 24.0* 23.2*  23.5* 24.9*  MCV 91.9 89.6 89.9 92.9 91.2  PLT 93* 122* 139* 177 204    Cardiac Enzymes: Recent Labs  Lab 10/27/22 0418 10/28/22 0408 10/29/22 0635  CKTOTAL 694* 415* 373    BNP: Invalid input(s): "POCBNP"  CBG: No results for input(s): "GLUCAP" in the last 168 hours.   Microbiology: Results for orders placed or performed during the hospital encounter of 10/16/22  Blood Culture (routine x 2)     Status: None   Collection Time: 10/16/22 11:44 PM   Specimen: BLOOD LEFT ARM  Result Value Ref Range Status   Specimen Description   Final    BLOOD LEFT ARM Performed at [facility], [address], [place], [place] [address]    Special Requests   Final    BOTTLES DRAWN AEROBIC AND ANAEROBIC Blood Culture adequate volume Performed at [facility], [address], [place], [place] [address]    Culture  Setup Time   Final    GRAM POSTIVE ORGANISM AEROBIC BOTTLE ONLY CRITICAL VALUE NOTED.  VALUE IS CONSISTENT WITH PREVIOUSLY REPORTED AND CALLED VALUE.    Culture   Final    GRAM POSITIVE RODS SENT TO [facility] FOR ID SEE SEPARATE REPORT Performed at [facility], [address], [place], [place]  [address]    Report Status 10/31/2022 FINAL  Final  Blood Culture (routine x 2)     Status: Abnormal   Collection Time: 10/16/22 11:44 PM   Specimen: BLOOD RIGHT ARM  Result Value Ref Range Status   Specimen Description   Final    BLOOD RIGHT ARM Performed at [facility], [address] [place], [place] [address]    Special Requests   Final    BOTTLES DRAWN AEROBIC AND ANAEROBIC Blood Culture adequate volume Performed at [facility], [address], [place], [place] [address]    Culture  Setup Time   Final    GRAM POSITIVE COCCI ANAEROBIC BOTTLE ONLY CRITICAL RESULT CALLED TO, READ BACK BY AND VERIFIED WITH: NATHAN BELUE @ 2253 10/17/22 [facility] GRAM POSITIVE RODS AEROBIC BOTTLE ONLY CRITICAL RESULT CALLED TO, READ BACK BY AND  VERIFIED WITH: JASON ROBBINS@0310  10/19/22 [facility]    Culture (A)  Final    STAPHYLOCOCCUS EPIDERMIDIS THE SIGNIFICANCE OF ISOLATING THIS ORGANISM FROM A SINGLE SET OF BLOOD CULTURES WHEN MULTIPLE SETS ARE DRAWN IS UNCERTAIN. PLEASE NOTIFY THE MICROBIOLOGY DEPARTMENT WITHIN ONE WEEK IF SPECIATION AND SENSITIVITIES ARE REQUIRED. GRAM POSITIVE RODS SENT TO [facility] FOR ID AND SUSCEPTIBILITIES SEE SEPARATE REPORT Performed at [facility], [address], [place], [place] [address]    Report Status 10/31/2022 FINAL  Final  SARS Coronavirus 2 by RT PCR (hospital order, performed in [facility] hospital lab) *cepheid single result test* Anterior Nasal Swab     Status: None   Collection Time: 10/16/22 11:44 PM   Specimen: Anterior Nasal Swab  Result Value Ref Range Status   SARS Coronavirus 2 by RT PCR NEGATIVE NEGATIVE Final    Comment: (NOTE) SARS-CoV-2 target nucleic acids are NOT DETECTED.  The SARS-CoV-2 RNA is generally detectable in upper and lower respiratory specimens during the acute phase of infection. The lowest concentration of SARS-CoV-2 viral copies this assay can detect is 250 copies / mL. A negative result does not preclude SARS-CoV-2 infection and should not be used as the sole basis for treatment or other patient management decisions.  A negative result may occur with improper specimen collection / handling, submission of specimen other than nasopharyngeal swab, presence of viral mutation(s) within the areas targeted by this assay, and inadequate number of viral copies (<250 copies / mL). A negative result must be combined with clinical observations, patient history, and epidemiological information.  Fact Sheet for Patients:   RoadLapTop.co.za  Fact Sheet for Healthcare Providers: http://kim-miller.com/  This test is not yet approved or  cleared by the [place] FDA and has been authorized for detection and/or diagnosis  of SARS-CoV-2 by FDA under an Emergency Use Authorization (EUA).  This EUA will remain in effect (meaning this test can be used) for the duration of the COVID-19 declaration under Section 564(b)(1) of the Act, 21 U.S.C. section 360bbb-3(b)(1), unless the authorization is terminated or revoked sooner.  Performed at [facility], [address] [place], [place] [address]   Blood Culture ID Panel (Reflexed)     Status: Abnormal   Collection Time: 10/16/22 11:44 PM  Result Value Ref Range Status   Enterococcus faecalis NOT DETECTED NOT DETECTED Final   Enterococcus Faecium NOT DETECTED NOT DETECTED Final   Listeria monocytogenes NOT DETECTED NOT DETECTED Final   Staphylococcus species DETECTED (A) NOT DETECTED Final    Comment: CRITICAL RESULT CALLED TO, READ BACK BY AND VERIFIED WITH: NATHAN BELUE @ 2253 10/17/22 [facility]  Staphylococcus aureus (BCID) NOT DETECTED NOT DETECTED Final   Staphylococcus epidermidis DETECTED (A) NOT DETECTED Final    Comment: Methicillin (oxacillin) resistant coagulase negative staphylococcus. Possible blood culture contaminant (unless isolated from more than one blood culture draw or clinical case suggests pathogenicity). No antibiotic treatment is indicated for blood  culture contaminants. CRITICAL RESULT CALLED TO, READ BACK BY AND VERIFIED WITH: NATHAN BELUE @ 2253 10/17/22 [facility]    Staphylococcus lugdunensis NOT DETECTED NOT DETECTED Final   Streptococcus species NOT DETECTED NOT DETECTED Final   Streptococcus agalactiae NOT DETECTED NOT DETECTED Final   Streptococcus pneumoniae NOT DETECTED NOT DETECTED Final   Streptococcus pyogenes NOT DETECTED NOT DETECTED Final   A.calcoaceticus-baumannii NOT DETECTED NOT DETECTED Final   Bacteroides fragilis NOT DETECTED NOT DETECTED Final   Enterobacterales NOT DETECTED NOT DETECTED Final   Enterobacter cloacae complex NOT DETECTED NOT DETECTED Final   Escherichia coli NOT DETECTED NOT DETECTED Final    Klebsiella aerogenes NOT DETECTED NOT DETECTED Final   Klebsiella oxytoca NOT DETECTED NOT DETECTED Final   Klebsiella pneumoniae NOT DETECTED NOT DETECTED Final   Proteus species NOT DETECTED NOT DETECTED Final   Salmonella species NOT DETECTED NOT DETECTED Final   Serratia marcescens NOT DETECTED NOT DETECTED Final   Haemophilus influenzae NOT DETECTED NOT DETECTED Final   Neisseria meningitidis NOT DETECTED NOT DETECTED Final   Pseudomonas aeruginosa NOT DETECTED NOT DETECTED Final   Stenotrophomonas maltophilia NOT DETECTED NOT DETECTED Final   Candida albicans NOT DETECTED NOT DETECTED Final   Candida auris NOT DETECTED NOT DETECTED Final   Candida glabrata NOT DETECTED NOT DETECTED Final   Candida krusei NOT DETECTED NOT DETECTED Final   Candida parapsilosis NOT DETECTED NOT DETECTED Final   Candida tropicalis NOT DETECTED NOT DETECTED Final   Cryptococcus neoformans/gattii NOT DETECTED NOT DETECTED Final   Methicillin resistance mecA/C DETECTED (A) NOT DETECTED Final    Comment: CRITICAL RESULT CALLED TO, READ BACK BY AND VERIFIED WITHDawayne Cirri @ 2253 10/17/22 [facility] Performed at [facility], [address] [place], [place] [address]   Organism ID, Bacteria     Status: None (Preliminary result)   Collection Time: 10/16/22 11:44 PM  Result Value Ref Range Status   Organism ID, Bacteria REFERT  Final    Comment: (NOTE) Please refer to the following specimen for additional lab results. Please refer to spec [phone] for test result. Lisbeth Renshaw notified 11-01-2022 at 09:55 AM. Performed At: [facility] [address] [place], [place] [id number] Jolene Schimke MD [id number]    Source of Sample PENDING  Incomplete  Bacterial organism reflex     Status: None   Collection Time: 10/16/22 11:44 PM  Result Value Ref Range Status   Bacterial result 1 Comment  Final    Comment: (NOTE) Specimen has been received and testing has been  initiated. Performed At: [facility] [address] [place], [place] [id number] Jolene Schimke MD GU:[id number]   Respiratory (~20 pathogens) panel by PCR     Status: None   Collection Time: 10/17/22  9:26 AM   Specimen: Nasopharyngeal Swab; Respiratory  Result Value Ref Range Status   Adenovirus NOT DETECTED NOT DETECTED Final   Coronavirus 229E NOT DETECTED NOT DETECTED Final    Comment: (NOTE) The Coronavirus on the Respiratory Panel, DOES NOT test for the novel  Coronavirus (2019 nCoV)    Coronavirus HKU1 NOT DETECTED NOT DETECTED Final   Coronavirus NL63 NOT DETECTED NOT DETECTED Final   Coronavirus  OC43 NOT DETECTED NOT DETECTED Final   Metapneumovirus NOT DETECTED NOT DETECTED Final   Rhinovirus / Enterovirus NOT DETECTED NOT DETECTED Final   Influenza A NOT DETECTED NOT DETECTED Final   Influenza B NOT DETECTED NOT DETECTED Final   Parainfluenza Virus 1 NOT DETECTED NOT DETECTED Final   Parainfluenza Virus 2 NOT DETECTED NOT DETECTED Final   Parainfluenza Virus 3 NOT DETECTED NOT DETECTED Final   Parainfluenza Virus 4 NOT DETECTED NOT DETECTED Final   Respiratory Syncytial Virus NOT DETECTED NOT DETECTED Final   Bordetella pertussis NOT DETECTED NOT DETECTED Final   Bordetella Parapertussis NOT DETECTED NOT DETECTED Final   Chlamydophila pneumoniae NOT DETECTED NOT DETECTED Final   Mycoplasma pneumoniae NOT DETECTED NOT DETECTED Final    Comment: Performed at [facility], [address], [place], [place] [address]  MRSA Next Gen by PCR, Nasal     Status: None   Collection Time: 10/18/22 10:53 AM   Specimen: Nasal Mucosa; Nasal Swab  Result Value Ref Range Status   MRSA by PCR Next Gen NOT DETECTED NOT DETECTED Final    Comment: (NOTE) The GeneXpert MRSA Assay (FDA approved for NASAL specimens only), is one component of a comprehensive MRSA colonization surveillance program. It is not intended to diagnose MRSA infection nor to guide or monitor treatment  for MRSA infections. Test performance is not FDA approved in patients less than 48 years old. Performed at [facility], [address] [place], [place] [address]     Coagulation Studies: No results for input(s): "LABPROT", "INR" in the last 72 hours.   Urinalysis: No results for input(s): "COLORURINE", "LABSPEC", "PHURINE", "GLUCOSEU", "HGBUR", "BILIRUBINUR", "KETONESUR", "PROTEINUR", "UROBILINOGEN", "NITRITE", "LEUKOCYTESUR" in the last 72 hours.  Invalid input(s): "APPERANCEUR"    Imaging: No results found.   Medications:    anticoagulant sodium citrate      atorvastatin  40 mg Oral QHS   benztropine  2 mg Oral BID   busPIRone  15 mg Oral BID   Chlorhexidine Gluconate Cloth  6 each Topical Daily   cloZAPine  25 mg Oral BID   divalproex  1,000 mg Oral QHS   doxazosin  4 mg Oral Daily   epoetin (EPOGEN/PROCRIT) injection  10,000 Units Intravenous Q M,W,F-HD   feeding supplement (NEPRO CARB STEADY)  237 mL Oral TID BM   lactulose  30 g Oral QHS   levothyroxine  75 mcg Oral Q0600   lidocaine  10 mL Intradermal Once   multivitamin  1 tablet Oral QHS   nicotine  21 mg Transdermal Daily   mouth rinse  15 mL Mouth Rinse 4 times per day   pantoprazole  40 mg Oral Daily   traZODone  50 mg Oral QHS   valbenazine  80 mg Oral Daily   acetaminophen **OR** acetaminophen, albuterol, alteplase, alum & mag hydroxide-simeth, anticoagulant sodium citrate, bismuth subsalicylate, guaiFENesin, heparin, lidocaine (PF), lidocaine-prilocaine, loperamide, LORazepam, magnesium hydroxide, magnesium hydroxide, meclizine, methocarbamol, morphine injection, ondansetron **OR** [DISCONTINUED] ondansetron (ZOFRAN) IV, mouth rinse, oxyCODONE-acetaminophen, pentafluoroprop-tetrafluoroeth, polyethylene glycol, sodium chloride  Assessment/ Plan:  Mr. Austin Marsh is a 67 y.o.  male with past medical history of hypothyroidism, depression with anxiety, schizoaffective, and COPD, who was admitted  to [facility] on 10/16/2022 for Lactic acidosis [E87.20] Elevated troponin level [R79.89] Demand ischemia [I24.89] NSTEMI (non-ST elevated myocardial infarction) (HCC) [I21.4] Acute kidney injury (HCC) [N17.9] Septic shock (HCC) [A41.9, R65.21] Sepsis due to undetermined organism (HCC) [A41.9] Altered mental status, unspecified altered mental status  type [R41.82] SIRS (systemic inflammatory response syndrome) (HCC) [R65.10]   Acute kidney injury secondary to rhabdomyolysis.  Baseline creatinine of 1.17, GFR >60 on 06/04/2022. Requiring hemodialysis.   -Dialysis received yesterday, seated in chair. - UF 1.5 L achieved.  - Next treatment scheduled for Wednesday  - Renal navigator seeking outpatient dialysis clinic.   Lab Results  Component Value Date   CREATININE 4.78 (H) 11/01/2022   CREATININE 5.34 (H) 10/30/2022   CREATININE 4.39 (H) 10/29/2022    Intake/Output Summary (Last 24 hours) at 11/02/2022 1129 Last data filed at 11/02/2022 0220 Gross per 24 hour  Intake --  Output 2050 ml  Net -2050 ml   2. Anemia of acute blood loss:  Lab Results  Component Value Date   HGB 8.5 (L) 11/02/2022  - Hgb below desired range -Continue EPO with HD treatments.   3. Hypertension:  Regimen currently of doxazosin.  - Blood pressure 139/93.   4. Hyponatremia: resolved       LOS: 16   7/16/202411:29 AM

## 2022-11-02 NOTE — Progress Notes (Signed)
Nutrition Follow Up Note   DOCUMENTATION CODES:   Not applicable  INTERVENTION:   Nepro Shake po TID, each supplement provides 425 kcal and 19 grams protein  Magic cup TID with meals, each supplement provides 290 kcal and 9 grams of protein  Rena-vit daily po daily    Daily weights   Assist with meals   NUTRITION DIAGNOSIS:   Inadequate oral intake related to acute illness as evidenced by NPO status. -resolving   GOAL:   Patient will meet greater than or equal to 90% of their needs -progressing   MONITOR:   PO intake, Supplement acceptance, Labs, Weight trends, I & O's, Skin  ASSESSMENT:   67 y/o male with medical history significant of COPD, hypothyroidism, depression with anxiety, BPH, CKD-3A, schizoaffective disorder with bipolar, tardive dyskinesia and OSA on CPAP who is admitted with fall, PNA, sepsis, bacteremia, rhabdomyolysis, AMS and AKI.  Pt continues to have poor appetite and oral intake in hospital. Pt eating ~25% of meals with assistance. RN reports that patient is still drinking Nepro with encouragement. It is difficult to determine if patient has had any recent significant weight changes as pt's bed weights have varied since admission. Plan is for discharge to SNF.    Pt with AKI requiring temporary HD starting 7/6. Pt continues to tolerate HD.   Medications reviewed and include: epoetin, lactulose, synthroid, rena-vit, nicotine, protonix  Labs reviewed: Na 133(L), K 4.5 wnl, BUN 58(H), creat 4.78(H) Hgb 8.5(L), Hct 24.9(L)  Diet Order:   Diet Order             DIET DYS 2 Room service appropriate? Yes with Assist; Fluid consistency: Thin  Diet effective now                  EDUCATION NEEDS:   Not appropriate for education at this time  Skin:  Skin Assessment: Reviewed RN Assessment (Hematoma Left Neck/Shoulder, ecchymosis, Stage II elbow)  Last BM:  7/15- type 6  Height:   Ht Readings from Last 1 Encounters:  10/18/22 5\' 9"  (1.753 m)     Weight:   Wt Readings from Last 1 Encounters:  11/02/22 99.9 kg    Ideal Body Weight:  72.7 kg  BMI:  Body mass index is 32.52 kg/m.  Estimated Nutritional Needs:   Kcal:  1900-2200kcal/day  Protein:  90-105g/day  Fluid:  1.9-2.2L/day  Betsey Holiday MS, RD, LDN Please refer to [facility] for RD and/or RD on-call/weekend/after hours pager

## 2022-11-02 NOTE — Progress Notes (Signed)
Progress Note   Patient: Austin Marsh WNU:[id number] DOB: 04-Feb-1956 DOA: 10/16/2022     16 DOS: the patient was seen and examined on 11/02/2022      Brief hospital course: "Austin Marsh is a 67 y.o. male with medical history significant of COPD, hypothyroidism, depression with anxiety, BPH, CKD-3A, schizoaffective disorder with bipolar, tardive dyskinesia, OSA on CPAP, who presents with fall and AMS."  Caregiver at family care facility.  reported pt was in his normal health condition until 10:30 PM yesterday when pt had fall and was found on the floor. At normal baseline, patient is alert oriented x 3 but today is confused.  On admission, pt was lethargic, oriented to person and place, not to year or situation.  He reported right hip pain with moving right leg, low back pain, some chest pain which he was unable to characterize further, constipation, suprapubic pain.  He was found to have acute urinary retention (973 cc on bladder scan).  Foley catheter was placed.     In the ED - fever of 101, HR 123, RR 26, stable O2 sat on room air.  Per EMS report, patient had low BP, but at time of admission BP was stable at 124/73.   Chest x-ray negative.   CT of head negative for acute intracranial abnormalities.   CT Chest/Abd/Pelvis: Moderate coronary artery calcification. No acute intrathoracic or intra-abdominal pathology identified. Stable 9 mm right adrenal adenoma. Stable changes of adrenal hyperplasia. Marked distention of the bladder possibly reflecting changes of bladder outlet obstruction. Mild prostatic hypertrophy.  Probable post TURP changes.  X-ray of bilateral hips/pelvis negative for fracture.   X-ray of L-spine and T-spine negative for acute fracture, did showed degenerative disc disease    Notable labs - WBC 11.2, severe AKI with Cr 7.78, sodium 123, negative COVID PCR, lactic acid> 9.0 ---> 4.9, troponin level 172, 406, 804, 784.  Sodium 123, magnesium 2.3,  Hemoglobin 11.5.   Pt was  admitted to PCU. Subsequently found to have severe rhabdomyolysis with CK peaking above 50k. IV heparin was started and cardiology consulted due to troponin elevation.   7/1 -- pt hypotensive, requiring IV fluid boluses.  Worsening encephalopathy, Hbg dropped to 6.3, large area of ecchymosis and swelling in the left neck & upper shoulder concerning for hematoma, later confirmed on CT check and neck soft tissue.  Pt was transferred to stepdown for developing shock. PCCM consulted.  Transfused 2 units pRBC's, 1 FFP.   7/2 -- BP's improved after 3 units pRBC's, 1 FFP total.  Hbg improved from nadir 5.4 to 10.1 >> 9.3 this AM.  +Blood cultures with GPC's and GPR's.  ID consulted.  CK trending down. Nephrology consulted with worsening renal function and poor urine output reported.   7/3-7/8 patient's renal function continues to worsen and she continues to remain altered nephrologist made decision for hemodialysis initiation.  Patient's wife is in the psych unit and according to psychiatry lacks capacity to make decision for patient.   Assessment and Plan:     Acute kidney injury (HCC)-worsening Stage 3a CKD: Baseline Cr 1.17 on 06/04/2022.  Cr on admission 7.78 AKI likely due to severe rhabdomyolysis, sepsis, dehydration, urinary retention secondary to bladder outlet obstruction. Case discussed with nephrology team Avoid nephrotoxic medications Sodium bicarb drip discontinued Had dialysis catheter placement on 10/23/2022 Hemodialysis was initiated on patient on 10/23/2022 R internal jugular Permcath placed 7/10    * Septic shock (HCC)-improved On admission, tachycardic, tachypneic, febrile. Lactic acid was above  9 meeting septic shock criteria (with dehydration, AKI, rhabdo contributing).  Hypotension seemed more due to acute blood loss 2/2 left neck hematoma.  BP responded to fluids, blood products and midodrine, did not require pressors. Has bacteremia and RLL pneumonia suspect acute aspiration on  7/1 while encephalopathic  Patient completed a course of Zyvox. Antibiotics discontinued by infectious disease. Continue to monitor closely Infectious disease on board  Leukocytosis -- WBC up past few days, 12.1 >> 13.5 this AM (7/14) No fevers or s/sx's of infection Monitor CBC Monitor closely for signs of infection No respiratory symptoms, but low threshold to repeat a chest xray   Acute respiratory failure with hypoxia (HCC) Right lower lobe pneumonia, not POA, suspect acute aspiration event on 7/1. Antibiotics completed and discontinued by ID on 10/22/2022 Weaned off O2 and stable on room air  7/12 --- RN reported choking spell during breakfast --Low threshold to repeat chest xray, will hold off for now & monitor  --Robitussin PRN   Bacteremia due to Staphylococcus Initial blood cx +staph epidermidis felt contaminant.  Has completed antibiotics and discontinued by ID on 10/22/2022 Infectious disease on board we appreciate input   Hematoma-resolved Hematoma Left Neck/Shoulder  Acute Blood Loss Anemia Hypotension due to above and ?septic shock 7/1 - Hbg dropped from 11.5 >> 6.3 and pt noted to have new swelling and ecchymosis at the left neck & upper shoulder.  7/11 - Hbg 6.9 - transfused 1 unit pRBC's (4th total) Hbg since has been stable. --Heparin was stopped  --Transfused total 4 units pRBC's and 1 FFP --Transferred to stepdown 7/1 --PCCM consulted for pressors if needed - signed off, BP's stabilized Continue to monitor CBC closely   Rhabdomyolysis-improved CK peaked > 50000 Given aggressive IV hydration for this and hypotension, then devoped hypoxia on 7/1, concern for pulmonary edema but found to have RLL collapse.  Fluid held as patient requiring dialysis now CK normalized on 7/12, 373   Acute metabolic encephalopathy CT head on admission negative.  Multifactorial due to metabolic derangements, sepsis / bacteremia, underlying psych conditions Home psych meds resumed   now that he can take PO meds Continue delirium precautions -As needed Ativan for severe agitation otherwise we will avoid benzodiazepine use   Acute retention of urine - resolved Hx of BPH Anuria due to renal failure Foley placed on admission. Blood pressure improved however patient unable to take his oral Cardura Resume doxazosin  7/13 -- dc foley, voiding trial In/out cath was done overnight, only 60 cc yielded 7/14 -- not voiding, but minimal urine on bladder scans Bladder scans to monitor   NSTEMI (non-ST elevated myocardial infarction) (HCC) Elevated troponin - demand ischemia & delayed clearance with severe AKI. -Cardiology was consulted, signed off --IV heparin stopped 7/1 AM with acute anemia and hematoma Continue to monitor closely   Fall Unknown cause, pt was found on the floor at his family care home, per caregiver's history on admission. --PT OT evaluation when clinically improved --Imaging was negative for acute injuries / fractures --Neck hematoma developed 7/1 Confirmed on CT chest and neck soft tissue obtained later on 7/1 PM   Hyponatremia- resolved Sodium 124 on 10/21/2022 Saline fluids stopped due to hypoxia on 7/1 Monitor sodium level closely Nephrology following Monitor BMP's   Schizoaffective disorder, bipolar type (HCC) Depression with anxiety --Resume home Benztropine, BuSpar, clozapine, Depakote, Ingrezza (held while unable to take PO meds) --Allergy listed to haldol, will use IV Ativan PRN if agitation  --Clozaril resumed at 12.5  mg BID given duration it was held  --Increased Clozaril to 25 mg BID (7/13) --Continue titrating Clozaril dose to home dose of 100 mg qAM, 200 mg qPM --Pharmacy on board with resuming Clozaril --Monitor ANC  COPD (chronic obstructive pulmonary disease) (HCC) Stable Continue PRN bronchodilators and Mucine  Hypothyroidism Continue Synthroid  Tobacco abuse Nicotine patch ordered       Subjective / Interval  history Pt was sleeping but woke easily to voice.  He denies any complaints, says he feels fine.  No acute events reported.  Says he has dialysis tomorrow.      Disposition: Needs SNF   Status is: Inpatient Remains inpatient appropriate because: continues to require hemodialysis.  Needs SNF and outpatient dialysis set up for d/c.      Physical examination:   General exam: sleeping, woke to voice, no acute distress HEENT: left neck and upper shoulder ecchymosis improving, moist mucus membranes, hearing grossly normal  Respiratory system: on room air, normal respiratory effort. Cardiovascular system:R internal jugular dialysis catheter, RRR  Gastrointestinal system: soft, NT, ND Central nervous system: A&O x self at least. no gross focal neurologic deficits, normal speech, follow commands Extremities: 2+ BLE edema, moves all extremities  Skin: dry, intact, normal temperature Psychiatry: normal mood, congruent affect, confused       Data Reviewed: Notable labs --- Hbg improved 8.5 from 7.7   Family Communication: None      Author: Pennie Banter, DO 11/02/2022 7:02 PM  For on call review [email or web address].

## 2022-11-02 NOTE — Plan of Care (Signed)

## 2022-11-02 NOTE — Progress Notes (Signed)
Occupational Therapy Treatment Patient Details Name: Austin Marsh MRN: [id number] DOB: December 26, 1955 Today's Date: 11/02/2022   History of present illness Pt is a 67 year old male presenting to ED with fall and AMS; hospital course complicated by stage 3a CKD, septic shock, acute respiratory failure with hypoxia, bacteremia due to staphylococcus, rhabdomyolysis; acute metabolic encephalotomy, NSTEMI, hyponatremia; prior medical history significant of COPD, hypothyroidism, depression with anxiety, BPH, CKD-3A, schizoaffective disorder with bipolar, tardive dyskinesia, OSA on CPAP   OT comments  Pt seen for OT treatment on this date. Upon arrival to room pt upright in bed, lunch tray present, agreeable to OT Tx session. OT facilitated ADL management as described below. See ADL section for additional details regarding occupational performance. Pt continues to be functionally limited by decreased tolerance to activity, impaired cognition, generalized weakness which impact safe, efficient ADL performance. Encourged pt to try to eat lunch, pt declining politely. Pt verbalizes incontinent BM, and is able to roll L<>R for TOTAL A for post hygiene. Supine > sitting EOB with minA. Pt verbalizing that he is cold, warm blanket given. RN in room, notified of BM and mobility status. Pt pleasantly confused with good efforts throughout though does require occ redirection to task. Pt is progressing toward OT goals and continues to benefit from skilled OT services to maximize return to PLOF and minimize risk of future falls, injury, caregiver burden, and readmission. Will continue to follow POC as written. Discharge recommendation remains appropriate.     Recommendations for follow up therapy are one component of a multi-disciplinary discharge planning process, led by the attending physician.  Recommendations may be updated based on patient status, additional functional criteria and insurance authorization.    Assistance  Recommended at Discharge Frequent or constant Supervision/Assistance  Patient can return home with the following  A lot of help with walking and/or transfers;A lot of help with bathing/dressing/bathroom;Assistance with cooking/housework;Direct supervision/assist for medications management;Direct supervision/assist for financial management;Help with stairs or ramp for entrance;Assist for transportation   Equipment Recommendations  Other (comment)       Precautions / Restrictions Precautions Precautions: Fall Restrictions Weight Bearing Restrictions: No       Mobility Bed Mobility Overal bed mobility: Needs Assistance Bed Mobility: Supine to Sit, Sit to Supine Rolling: Min assist   Supine to sit: Min assist Sit to supine: Min assist   General bed mobility comments: Min A for overall bed mobility, multimodal cuing and increased time. Unable to laterally scoot towards HOB, returns to supine and boots up toward [facility] with minA    Transfers                         Balance Overall balance assessment: Needs assistance Sitting-balance support: Feet supported, Single extremity supported Sitting balance-Leahy Scale: Fair Sitting balance - Comments: Tolerates sitting EOB ~3 mins Postural control: Posterior lean                                 ADL either performed or assessed with clinical judgement   ADL Overall ADL's : Needs assistance/impaired                             Toileting- Clothing Manipulation and Hygiene: Total assistance;Bed level Toileting - Clothing Manipulation Details (indicate cue type and reason): Total A for rolling b ways for pericare after BM  General ADL Comments: Pt verbalizes incontinence of bowel; able to roll L <> R with increased time and cuing for TOTAL A for posterior hygeine and pad change.    Extremity/Trunk Assessment               Cognition Arousal/Alertness: Awake/alert Behavior During Therapy:  WFL for tasks assessed/performed Overall Cognitive Status: No family/caregiver present to determine baseline cognitive functioning Area of Impairment: Orientation, Attention, Memory, Following commands, Safety/judgement, Awareness                 Orientation Level: Disoriented to, Place, Time, Situation Current Attention Level: Focused Memory: Decreased short-term memory, Decreased recall of precautions Following Commands: Follows one step commands inconsistently Safety/Judgement: Decreased awareness of safety, Decreased awareness of deficits Awareness: Intellectual   General Comments: Tangential, pleasantly confused        Exercises Exercises: General Lower Extremity General Exercises - Lower Extremity Ankle Circles/Pumps: AROM, 10 reps Hip Flexion/Marching: 5 reps Toe Raises: 5 reps Heel Raises: 5 reps       General Comments Encourged pt to eat bites of lunch, pt refused    Pertinent Vitals/ Pain       Pain Assessment Pain Assessment: 0-10 Pain Score: 4  Pain Location: Generalized   Frequency  Min 1X/week        Progress Toward Goals  OT Goals(current goals can now be found in the care plan section)  Progress towards OT goals: Progressing toward goals  Acute Rehab OT Goals OT Goal Formulation: With patient Time For Goal Achievement: 11/11/22 Potential to Achieve Goals: Good  Plan Discharge plan remains appropriate       AM-PAC OT "6 Clicks" Daily Activity     Outcome Measure   Help from another person eating meals?: A Little Help from another person taking care of personal grooming?: A Little Help from another person toileting, which includes using toliet, bedpan, or urinal?: Total Help from another person bathing (including washing, rinsing, drying)?: A Lot Help from another person to put on and taking off regular upper body clothing?: A Little Help from another person to put on and taking off regular lower body clothing?: A Lot 6 Click Score:  14    End of Session    OT Visit Diagnosis: Other abnormalities of gait and mobility (R26.89);Unsteadiness on feet (R26.81)   Activity Tolerance Patient tolerated treatment well   Patient Left in bed;with call bell/phone within reach;with bed alarm set;with nursing/sitter in room   Nurse Communication Mobility status;Other (comment) (BM)        Time: 1610-9604 OT Time Calculation (min): 35 min  Charges: OT General Charges $OT Visit: 1 Visit OT Treatments $Self Care/Home Management : 23-37 mins Gowri Suchan L. Mohsin Crum, OTR/L  11/02/22, 3:27 PM

## 2022-11-02 NOTE — Progress Notes (Signed)
PT Cancellation Note  Patient Details Name: [name] MRN: [id number] DOB: [birth year]   Cancelled Treatment:     Pt politely declined after recently working with OT at bedside. Will re-attempt next available date/time per POC. Pt is awaiting SNF placement.   Jannet Askew 11/02/2022, 4:39 PM

## 2022-11-03 DIAGNOSIS — R6521 Severe sepsis with septic shock: Secondary | ICD-10-CM | POA: Diagnosis not present

## 2022-11-03 DIAGNOSIS — A419 Sepsis, unspecified organism: Secondary | ICD-10-CM | POA: Diagnosis not present

## 2022-11-03 LAB — RENAL FUNCTION PANEL
Albumin: 2.5 g/dL — ABNORMAL LOW (ref 3.5–5.0)
Anion gap: 6 (ref 5–15)
BUN: 39 mg/dL — ABNORMAL HIGH (ref 8–23)
CO2: 29 mmol/L (ref 22–32)
Calcium: 8.2 mg/dL — ABNORMAL LOW (ref 8.9–10.3)
Chloride: 95 mmol/L — ABNORMAL LOW (ref 98–111)
Creatinine, Ser: 3.81 mg/dL — ABNORMAL HIGH (ref 0.61–1.24)
GFR, Estimated: 16 mL/min — ABNORMAL LOW (ref >60)
Glucose, Bld: 90 mg/dL (ref 70–99)
Phosphorus: 4.6 mg/dL (ref 2.5–4.6)
Potassium: 4.9 mmol/L (ref 3.5–5.1)
Sodium: 130 mmol/L — ABNORMAL LOW (ref 135–145)

## 2022-11-03 LAB — CBC
HCT: 24.1 % — ABNORMAL LOW (ref 39.0–52.0)
Hemoglobin: 8 g/dL — ABNORMAL LOW (ref 13.0–17.0)
MCH: 30.8 pg (ref 26.0–34.0)
MCHC: 33.2 g/dL (ref 30.0–36.0)
MCV: 92.7 fL (ref 80.0–100.0)
Platelets: 240 10*3/uL (ref 150–400)
RBC: 2.6 MIL/uL — ABNORMAL LOW (ref 4.22–5.81)
RDW: 15.9 % — ABNORMAL HIGH (ref 11.5–15.5)
WBC: 9.4 10*3/uL (ref 4.0–10.5)
nRBC: 0 % (ref 0.0–0.2)

## 2022-11-03 MED ORDER — EPOETIN ALFA 10000 UNIT/ML IJ SOLN
INTRAMUSCULAR | Status: AC
  Filled 2022-11-03: qty 1

## 2022-11-03 NOTE — Progress Notes (Signed)
PROGRESS NOTE    Austin Marsh  FYB:[id number] DOB: 04/25/1955 DOA: 10/16/2022 PCP: Koren Bound, NP  230A/230A-AA  LOS: 17 days   Brief hospital course:   Assessment & Plan: Austin Marsh is a 67 y.o. male with medical history significant of COPD, hypothyroidism, depression with anxiety, BPH, CKD-3A, schizoaffective disorder with bipolar, tardive dyskinesia, OSA on CPAP, who presents with fall and AMS."  Caregiver at family care facility.  reported pt was in his normal health condition until 10:30 PM yesterday when pt had fall and was found on the floor. At normal baseline, patient is alert oriented x 3 but today is confused.  On admission, pt was lethargic, oriented to person and place, not to year or situation.  He reported right hip pain with moving right leg, low back pain, some chest pain which he was unable to characterize further, constipation, suprapubic pain.  He was found to have acute urinary retention (973 cc on bladder scan).  Foley catheter was placed.    n the ED - fever of 101, HR 123, RR 26, stable O2 sat on room air.  Per EMS report, patient had low BP, but at time of admission BP was stable at 124/73.   Chest x-ray negative.   CT of head negative for acute intracranial abnormalities.   CT Chest/Abd/Pelvis: Moderate coronary artery calcification. Marsh acute intrathoracic or intra-abdominal pathology identified. Stable 9 mm right adrenal adenoma. Stable changes of adrenal hyperplasia. Marked distention of the bladder possibly reflecting changes of bladder outlet obstruction. Mild prostatic hypertrophy.  Probable post TURP changes.  X-ray of bilateral hips/pelvis negative for fracture.   X-ray of L-spine and T-spine negative for acute fracture, did showed degenerative disc disease    Notable labs - WBC 11.2, severe AKI with Cr 7.78, sodium 123, negative COVID PCR, lactic acid> 9.0 ---> 4.9, troponin level 172, 406, 804, 784.  Sodium 123, magnesium 2.3,  Hemoglobin 11.5.   Pt  was admitted to PCU. Subsequently found to have severe rhabdomyolysis with CK peaking above 50k. IV heparin was started and cardiology consulted due to troponin elevation.   7/1 -- pt hypotensive, requiring IV fluid boluses.  Worsening encephalopathy, Hbg dropped to 6.3, large area of ecchymosis and swelling in the left neck & upper shoulder concerning for hematoma, later confirmed on CT check and neck soft tissue.  Pt was transferred to stepdown for developing shock. PCCM consulted.  Transfused 2 units pRBC's, 1 FFP.   7/2 -- BP's improved after 3 units pRBC's, 1 FFP total.  Hbg improved from nadir 5.4 to 10.1 >> 9.3 this AM.  +Blood cultures with GPC's and GPR's.  ID consulted.  CK trending down. Nephrology consulted with worsening renal function and poor urine output reported.   7/3-7/8 patient's renal function continues to worsen and she continues to remain altered nephrologist made decision for hemodialysis initiation.  Patient's wife is in the psych unit and according to psychiatry lacks capacity to make decision for patient.    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  Cr on admission 7.78 AKI likely due to severe rhabdomyolysis, sepsis, dehydration, urinary retention secondary to bladder outlet obstruction. Case discussed with nephrology team Had dialysis catheter placement on 10/23/2022 HD initiated. R internal jugular Permcath placed 7/10 --iHD per nephro   * Septic shock (HCC)-improved On admission, tachycardic, tachypneic, febrile. Lactic acid was above 9 meeting septic shock criteria (with dehydration, AKI, rhabdo contributing).  Hypotension seemed more due  to acute blood loss 2/2 left neck hematoma.  BP responded to fluids, blood products and midodrine, did not require pressors. Has bacteremia and RLL pneumonia suspect acute aspiration on 7/1 while encephalopathic  Patient completed a course of Zyvox. Antibiotics discontinued by  infectious disease. Continue to monitor closely Infectious disease on board   Acute respiratory failure with hypoxia (HCC) Right lower lobe pneumonia, not POA, suspect acute aspiration event on 7/1. Antibiotics completed and discontinued by ID on 10/22/2022 Weaned off O2 and stable on room air   Staph epidermidis bacteremia in 1 of bottle and gram-positive rod likely diptheroid as per lab Both are skin bacteria and contamination Is highly likely. Off antibiotics X 7 days After getting 6 days of linezolid   Hematoma-resolved Hematoma Left Neck/Shoulder  Acute Blood Loss Anemia Hypotension due to above and ?septic shock 7/1 - Hbg dropped from 11.5 >> 6.3 and pt noted to have new swelling and ecchymosis at the left neck & upper shoulder.  7/11 - Hbg 6.9 - transfused 1 unit pRBC's (4th total) Hbg since has been stable. --Heparin was stopped  --Transfused total 4 units pRBC's and 1 FFP  Rhabdomyolysis-improved CK peaked > 50000 Given aggressive IV hydration for this and hypotension, then devoped hypoxia on 7/1, concern for pulmonary edema but found to have RLL collapse.  Fluid held as patient requiring dialysis now CK normalized on 7/12, 373   Acute metabolic encephalopathy CT head on admission negative.  Multifactorial due to metabolic derangements, sepsis / bacteremia, underlying psych conditions Home psych meds resumed  now that he can take PO meds Continue delirium precautions   Acute retention of urine - resolved Hx of BPH Anuria due to renal failure Foley placed on admission. Blood pressure improved however patient unable to take his oral Cardura Resume doxazosin  7/13 -- dc foley, voiding trial In/out cath was done overnight, only 60 cc yielded 7/14 -- not voiding, but minimal urine on bladder scans Bladder scans to monitor   NSTEMI, ruled out Elevated troponin - demand ischemia & delayed clearance with severe AKI. -Cardiology was consulted, signed off --IV heparin  stopped 7/1 AM with acute anemia and hematoma   Fall Unknown cause, pt was found on the floor at his family care home, per caregiver's history on admission. --Imaging was negative for acute injuries / fractures --Neck hematoma developed 7/1 Confirmed on CT chest and neck soft tissue obtained later on 7/1 PM --SNF rehab   Hyponatremia- resolved   Schizoaffective disorder, bipolar type (HCC) Depression with anxiety --cont Buspar, clozapine, trazodone and Ingrezza   COPD (chronic obstructive pulmonary disease) (HCC) Stable Continue PRN bronchodilators and Mucine   Hypothyroidism Continue Synthroid   Tobacco abuse --cont nicotine patch    DVT prophylaxis: Heparin SQ Code Status: Full code  Family Communication:  Level of care: Telemetry Medical Dispo:   The patient is from: family care facility Anticipated d/c is to: SNF rehab Anticipated d/c date is: tomorrow   Subjective and Interval History:  Pt reported Marsh appetite.  Did drink supplement.   Objective: Vitals:   11/03/22 1300 11/03/22 1404 11/03/22 1634 11/03/22 2025  BP: (!) 160/77 (!) 148/72 119/61 132/68  Pulse: 70 74 76 78  Resp: 15 15 18 20   Temp: 97.9 F (36.6 C) 98.1 F (36.7 C) 98 F (36.7 C) 97.9 F (36.6 C)  TempSrc: Oral Oral    SpO2: 100% 98% 98% 99%  Weight:      Height:  Intake/Output Summary (Last 24 hours) at 11/03/2022 2228 Last data filed at 11/03/2022 2025 Gross per 24 hour  Intake 430 ml  Output 2150 ml  Net -1720 ml   Filed Weights   11/01/22 0303 11/02/22 0500 11/03/22 0500  Weight: 102 kg 99.9 kg 101 kg    Examination:   Constitutional: NAD, AAOx3 HEENT: conjunctivae and lids normal, EOMI CV: Marsh cyanosis.   RESP: normal respiratory effort, on RA Extremities: edema in BLE SKIN: warm, dry Neuro: II - XII grossly intact.   Psych: depressed mood and affect.     Data Reviewed: I have personally reviewed labs and imaging studies  Time spent: 35 minutes  Darlin Priestly,  MD [facility] If 7PM-7AM, please contact night-coverage 11/03/2022, 10:28 PM

## 2022-11-03 NOTE — Progress Notes (Addendum)
OT Cancellation Note  Patient Details Name: Austin Marsh MRN: [id number] DOB: [birth year]   Cancelled Treatment:    Reason Eval/Treat Not Completed: Patient at procedure or test/ unavailable. Pt noted to be off the floor for HD, unavailable at this time. Will continue to follow POC at later date/time as pt available.     3:15p: Pt in room with spouse and staff member present. Pt requests OT return after his visitors have left. Will re attempt as able.   [name] 11/03/2022, 1:38 PM

## 2022-11-03 NOTE — Discharge Planning (Addendum)
Time Correction below:  PLACEMENT RESOLVED: Outpatient Facility [facility] [address] [place], [place] [address] [phone]  Schedule: Monday Wednesday and Friday 12:10pm First treatment: Friday 7/19  11:10am  Will need a hoyer pad placed underneath for safe transfers.   Dimas Chyle Dialysis Coordinator II  [facility] Cell: [phone] eFax: [phone] [email or web address]

## 2022-11-03 NOTE — Progress Notes (Signed)
Occupational Therapy Treatment Patient Details Name: Austin Marsh MRN: [id number] DOB: Jul 30, 1955 Today's Date: 11/03/2022   History of present illness Pt is a 67 year old male presenting to ED with fall and AMS; hospital course complicated by stage 3a CKD, septic shock, acute respiratory failure with hypoxia, bacteremia due to staphylococcus, rhabdomyolysis; acute metabolic encephalotomy, NSTEMI, hyponatremia; prior medical history significant of COPD, hypothyroidism, depression with anxiety, BPH, CKD-3A, schizoaffective disorder with bipolar, tardive dyskinesia, OSA on CPAP   OT comments  Pt seen for OT treatment on this date. Upon arrival to room pt in bed, agreeable to OT Tx session. OT facilitated therapeutic activity described below. See flowsheet section for additional details regarding occupational performance. OT attempts to encourage pt to eat/drink but pt refusing multiple times. Pt continues to be functionally limited by decreased tolerance to activity, impaired cognition, generalized weakness and psychosocial factors which impact safe, efficient ADL performance. Noted improvement this date in overall bed mobility and good efforts at following commands throughout. While pt is disoriented, he puts forth good motivation and benefits from continued encouragement throughout session. Pt is progressing toward OT goals and continues to benefit from skilled OT services to maximize return to PLOF and minimize risk of future falls, injury, caregiver burden, and readmission. Will continue to follow POC as written. Discharge recommendation remains appropriate.     Recommendations for follow up therapy are one component of a multi-disciplinary discharge planning process, led by the attending physician.  Recommendations may be updated based on patient status, additional functional criteria and insurance authorization.    Assistance Recommended at Discharge Frequent or constant Supervision/Assistance   Patient can return home with the following  A lot of help with walking and/or transfers;A lot of help with bathing/dressing/bathroom;Assistance with cooking/housework;Direct supervision/assist for medications management;Direct supervision/assist for financial management;Help with stairs or ramp for entrance;Assist for transportation         Precautions / Restrictions Precautions Precautions: Fall Restrictions Weight Bearing Restrictions: No       Mobility Bed Mobility Overal bed mobility: Needs Assistance Bed Mobility: Supine to Sit Rolling: Min guard   Supine to sit: Min guard Sit to supine: Min guard, HOB elevated   General bed mobility comments: Increased time, but pt improving overall in bed mobility    Transfers                         Balance Overall balance assessment: Needs assistance Sitting-balance support: No upper extremity supported, Feet supported Sitting balance-Leahy Scale: Good                                     ADL either performed or assessed with clinical judgement    Extremity/Trunk Assessment Upper Extremity Assessment Upper Extremity Assessment: Generalized weakness   Lower Extremity Assessment Lower Extremity Assessment: Generalized weakness         Cognition Arousal/Alertness: Awake/alert Behavior During Therapy: WFL for tasks assessed/performed Overall Cognitive Status: History of cognitive impairments - at baseline Area of Impairment: Orientation, Attention, Memory, Following commands, Safety/judgement, Awareness                 Orientation Level: Disoriented to, Place, Time, Situation Current Attention Level: Focused Memory: Decreased short-term memory, Decreased recall of precautions Following Commands: Follows one step commands inconsistently Safety/Judgement: Decreased awareness of safety, Decreased awareness of deficits     General Comments: Tangential, pleasantly confused  Exercises  Exercises: General Upper Extremity, General Lower Extremity, Other exercises General Exercises - Upper Extremity Shoulder Flexion: AROM, 10 reps, Both, Seated Shoulder ABduction: AROM, Both, 10 reps, Seated General Exercises - Lower Extremity Hip Flexion/Marching: 10 reps, Both, AROM, Seated Other Exercises Other Exercises: Pt requires cues to participate, reporting he is tired, easily redirected with BUE "clapping" game for gross motor improvement and UE strengthening            Pertinent Vitals/ Pain       Pain Assessment Pain Assessment: No/denies pain   Frequency  Min 1X/week        Progress Toward Goals  OT Goals(current goals can now be found in the care plan section)  Progress towards OT goals: Progressing toward goals  Acute Rehab OT Goals OT Goal Formulation: With patient Time For Goal Achievement: 11/11/22 Potential to Achieve Goals: Good  Plan Discharge plan remains appropriate       AM-PAC OT "6 Clicks" Daily Activity     Outcome Measure   Help from another person eating meals?: A Little Help from another person taking care of personal grooming?: A Little Help from another person toileting, which includes using toliet, bedpan, or urinal?: Total Help from another person bathing (including washing, rinsing, drying)?: A Lot Help from another person to put on and taking off regular upper body clothing?: A Little Help from another person to put on and taking off regular lower body clothing?: A Lot 6 Click Score: 14    End of Session    OT Visit Diagnosis: Other abnormalities of gait and mobility (R26.89);Unsteadiness on feet (R26.81)   Activity Tolerance Patient limited by fatigue   Patient Left in bed;with call bell/phone within reach;with bed alarm set   Nurse Communication Mobility status        Time: 1550-1605 OT Time Calculation (min): 15 min  Charges: OT General Charges $OT Visit: 1 Visit OT Treatments $Therapeutic Activity: 8-22  mins  Austin Marsh, OTR/L  11/03/22, 4:14 PM   Austin Marsh Austin Marsh 11/03/2022, 4:11 PM

## 2022-11-03 NOTE — Progress Notes (Signed)
Physical Therapy Treatment Patient Details Name: Austin Marsh MRN: [id number] DOB: 1956-03-05 Today's Date: 11/03/2022   History of Present Illness Pt is a 67 year old male presenting to ED with fall and AMS; hospital course complicated by stage 3a CKD, septic shock, acute respiratory failure with hypoxia, bacteremia due to staphylococcus, rhabdomyolysis; acute metabolic encephalotomy, NSTEMI, hyponatremia; prior medical history significant of COPD, hypothyroidism, depression with anxiety, BPH, CKD-3A, schizoaffective disorder with bipolar, tardive dyskinesia, OSA on CPAP    PT Comments  Pt was long sitting in bed upon arrival. He is alert but disoriented x 3. He is able to follow commands but requires increased time to process. Pt gets anxious with mobility but is able to perform with vcs for relaxation and technique improvements. Pt stood 4 x total during session. Attempted to have BM on BSC but was unable. Pt took steps from EOB > BSC then BSC > HD chair. Overall pt tolerated session well but is limited. He will greatly benefit from continued skilled PT at DC to maximize independence and safety with all ADLs.     Assistance Recommended at Discharge Intermittent Supervision/Assistance  If plan is discharge home, recommend the following:  Can travel by private vehicle    A lot of help with walking and/or transfers;A lot of help with bathing/dressing/bathroom;Assistance with cooking/housework;Assistance with feeding;Direct supervision/assist for medications management;Direct supervision/assist for financial management;Assist for transportation;Help with stairs or ramp for entrance      Equipment Recommendations  None recommended by PT       Precautions / Restrictions Precautions Precautions: Fall Restrictions Weight Bearing Restrictions: No     Mobility  Bed Mobility Overal bed mobility: Needs Assistance Bed Mobility: Supine to Sit Rolling: Min assist, Mod assist Supine to sit:  Min assist, Mod assist  General bed mobility comments: increased time required with TCs/Vcs for step by step sequencing.    Transfers Overall transfer level: Needs assistance Equipment used: Rolling walker (2 wheels) Transfers: Sit to/from Stand Sit to Stand: Mod assist, From elevated surface, Max assist  General transfer comment: max assist to stand from elevated bed height on first STS but only mod assist 2nd/3rd times. stood from [facility] after unsuccessful BM.    Ambulation/Gait Ambulation/Gait assistance: Mod assist, Max assist Gait Distance (Feet): 5 Feet Assistive device: Rolling walker (2 wheels) Gait Pattern/deviations: Step-to pattern Gait velocity: decreased  General Gait Details: pt ambulated 2 x 5 ft. needs lots of vcs and encouragement. he gets anxious but is able to perform.   Balance Overall balance assessment: Needs assistance Sitting-balance support: Feet supported, Single extremity supported Sitting balance-Leahy Scale: Fair     Standing balance support: Bilateral upper extremity supported, During functional activity Standing balance-Leahy Scale: Poor Standing balance comment: pt is high fall risk       Cognition Arousal/Alertness: Awake/alert Behavior During Therapy: WFL for tasks assessed/performed Overall Cognitive Status: No family/caregiver present to determine baseline cognitive functioning Area of Impairment: Orientation, Attention, Memory, Following commands, Safety/judgement, Awareness    Orientation Level: Disoriented to, Place, Time, Situation Current Attention Level: Focused   Following Commands: Follows one step commands inconsistently Safety/Judgement: Decreased awareness of safety, Decreased awareness of deficits     General Comments: Tangential, pleasantly confused               Pertinent Vitals/Pain Pain Assessment Pain Assessment: No/denies pain Pain Score: 0-No pain     PT Goals (current goals can now be found in the care plan  section) Acute Rehab PT Goals Patient  Stated Goal: None stated Progress towards PT goals: Progressing toward goals    Frequency    Min 1X/week      PT Plan Current plan remains appropriate       AM-PAC PT "6 Clicks" Mobility   Outcome Measure  Help needed turning from your back to your side while in a flat bed without using bedrails?: A Little Help needed moving from lying on your back to sitting on the side of a flat bed without using bedrails?: A Lot Help needed moving to and from a bed to a chair (including a wheelchair)?: A Lot Help needed standing up from a chair using your arms (e.g., wheelchair or bedside chair)?: A Lot Help needed to walk in hospital room?: A Lot Help needed climbing 3-5 steps with a railing? : A Lot 6 Click Score: 13    End of Session Equipment Utilized During Treatment: Gait belt Activity Tolerance: Patient tolerated treatment well Patient left: in chair;Other (comment) (In HD chair with transport taking pt to HD) Nurse Communication: Mobility status PT Visit Diagnosis: Muscle weakness (generalized) (M62.81);Other abnormalities of gait and mobility (R26.89);Difficulty in walking, not elsewhere classified (R26.2);Pain Pain - Right/Left: Right Pain - part of body: Hip;Leg     Time: 1610-9604 PT Time Calculation (min) (ACUTE ONLY): 21 min  Charges:    $Therapeutic Activity: 8-22 mins PT General Charges $$ ACUTE PT VISIT: 1 Visit                    Mae Cianci PTA 11/03/22, 3:18 PM

## 2022-11-03 NOTE — TOC Progression Note (Addendum)
Transition of Care [facility]) - Progression Note    Patient Details  Name: Austin Marsh MRN: [id number] Date of Birth: 26-Apr-1955  [facility] CM/SW Contact  Margarito Liner, LCSW Phone Number: 11/03/2022, 1:14 PM  Clinical Narrative: [facility] admissions coordinator is confirming they can accommodate chair time.    3:41 pm: Updated patient and wife at bedside.  Expected Discharge Plan and Services                                               Social Determinants of Health (SDOH) Interventions SDOH Screenings   Food Insecurity: No Food Insecurity (10/17/2022)  Housing: Patient Unable To Answer (10/17/2022)  Tobacco Use: High Risk (10/16/2022)    Readmission Risk Interventions    03/17/2020    1:48 PM  Readmission Risk Prevention Plan  Transportation Screening Complete  PCP or Specialist Appt within 3-5 Days --  HRI or Home Care Consult --  Palliative Care Screening Not Applicable  Medication Review (RN Care Manager) Complete

## 2022-11-03 NOTE — Progress Notes (Signed)
[facility]  ROUNDING NOTE   Subjective:   Patient seen and evaluated during dialysis   HEMODIALYSIS FLOWSHEET:  Blood Flow Rate (mL/min): 400 mL/min Arterial Pressure (mmHg): -220 mmHg Venous Pressure (mmHg): 150 mmHg TMP (mmHg): 5 mmHg Ultrafiltration Rate (mL/min): 686 mL/min Dialysate Flow Rate (mL/min): 300 ml/min Dialysis Fluid Bolus: Normal Saline  Tolerating treatment seated in chair Pleasant today  Objective:  Vital signs in last 24 hours:  Temp:  [97.8 F (36.6 C)-98.4 F (36.9 C)] 98.4 F (36.9 C) (07/17 0929) Pulse Rate:  [70-82] 82 (07/17 1100) Resp:  [12-20] 18 (07/17 1100) BP: (136-162)/(71-97) 162/80 (07/17 1100) SpO2:  [95 %-99 %] 97 % (07/17 1100) Weight:  [101 kg] 101 kg (07/17 0500)  Weight change: 1.1 kg Filed Weights   11/01/22 0303 11/02/22 0500 11/03/22 0500  Weight: 102 kg 99.9 kg 101 kg    Intake/Output: I/O last 3 completed shifts: In: -  Out: 500 [Urine:500]   Intake/Output this shift:  Total I/O In: -  Out: 450 [Urine:450]  Physical Exam: General: No acute distress  Head: Normocephalic, atraumatic. moist oral mucosal membranes  Eyes: Anicteric  Lungs:  Clear to auscultation, normal effort  Heart: Regular rate and rhythm  Abdomen:  Soft, nontender  Extremities: 1+ generalized edema.  Neurologic: Awake, alert  Skin: No rashes  Access: Rt IJ permcath        Basic Metabolic Panel: Recent Labs  Lab 10/28/22 0408 10/29/22 0635 10/30/22 0911 11/01/22 0526 11/03/22 0947  NA 139 137 135 133* 130*  K 4.1 3.8 4.1 4.5 4.9  CL 98 100 98 97* 95*  CO2 31 29 29 28 29   GLUCOSE 124* 100* 89 97 90  BUN 73* 48* 64* 58* 39*  CREATININE 5.97* 4.39* 5.34* 4.78* 3.81*  CALCIUM 8.4* 8.3* 8.2* 8.6* 8.2*  MG 2.1 1.7  --   --   --   PHOS 5.8* 3.2 4.0 5.2* 4.6    Liver Function Tests: Recent Labs  Lab 10/30/22 0911 11/01/22 0526 11/03/22 0947  ALBUMIN 2.2* 2.7* 2.5*   No results for input(s): "LIPASE", "AMYLASE"  in the last 168 hours.  No results for input(s): "AMMONIA" in the last 168 hours.  CBC: Recent Labs  Lab 10/30/22 0615 10/31/22 0523 11/01/22 0853 11/02/22 0625 11/03/22 0950  WBC 12.1* 13.5* 10.9* 9.2 9.4  NEUTROABS  --  9.1*  --   --   --   HGB 8.2* 8.1* 7.7* 8.5* 8.0*  HCT 24.0* 23.2* 23.5* 24.9* 24.1*  MCV 89.6 89.9 92.9 91.2 92.7  PLT 122* 139* 177 204 240    Cardiac Enzymes: Recent Labs  Lab 10/28/22 0408 10/29/22 0635  CKTOTAL 415* 373    BNP: Invalid input(s): "POCBNP"  CBG: No results for input(s): "GLUCAP" in the last 168 hours.   Microbiology: Results for orders placed or performed during the hospital encounter of 10/16/22  Blood Culture (routine x 2)     Status: None   Collection Time: 10/16/22 11:44 PM   Specimen: BLOOD LEFT ARM  Result Value Ref Range Status   Specimen Description   Final    BLOOD LEFT ARM Performed at [facility], [address], [place], [place] [address]    Special Requests   Final    BOTTLES DRAWN AEROBIC AND ANAEROBIC Blood Culture adequate volume Performed at [facility], [address] [place], [place] [address]    Culture  Setup Time   Final    GRAM POSTIVE ORGANISM  AEROBIC BOTTLE ONLY CRITICAL VALUE NOTED.  VALUE IS CONSISTENT WITH PREVIOUSLY REPORTED AND CALLED VALUE.    Culture   Final    GRAM POSITIVE RODS SENT TO [facility] FOR ID SEE SEPARATE REPORT Performed at [facility], [address], [place], [place] [address]    Report Status 10/31/2022 FINAL  Final  Blood Culture (routine x 2)     Status: Abnormal   Collection Time: 10/16/22 11:44 PM   Specimen: BLOOD RIGHT ARM  Result Value Ref Range Status   Specimen Description   Final    BLOOD RIGHT ARM Performed at [facility], [address], [place], [place] [address]    Special Requests   Final    BOTTLES DRAWN AEROBIC AND ANAEROBIC Blood Culture adequate volume Performed at [facility], [address], [place], [place] [address]    Culture  Setup Time   Final    GRAM POSITIVE COCCI ANAEROBIC BOTTLE ONLY CRITICAL RESULT CALLED TO, READ BACK BY AND VERIFIED WITH: NATHAN BELUE @ 2253 10/17/22 [facility] GRAM POSITIVE RODS AEROBIC BOTTLE ONLY CRITICAL RESULT CALLED TO, READ BACK BY AND VERIFIED WITH: JASON ROBBINS@0310  10/19/22 [facility]    Culture (A)  Final    STAPHYLOCOCCUS EPIDERMIDIS THE SIGNIFICANCE OF ISOLATING THIS ORGANISM FROM A SINGLE SET OF BLOOD CULTURES WHEN MULTIPLE SETS ARE DRAWN IS UNCERTAIN. PLEASE NOTIFY THE MICROBIOLOGY DEPARTMENT WITHIN ONE WEEK IF SPECIATION AND SENSITIVITIES ARE REQUIRED. GRAM POSITIVE RODS SENT TO [facility] FOR ID AND SUSCEPTIBILITIES SEE SEPARATE REPORT Performed at [facility], [address] [place], [place] [address]    Report Status 10/31/2022 FINAL  Final  SARS Coronavirus 2 by RT PCR (hospital order, performed in [facility] hospital lab) *cepheid single result test* Anterior Nasal Swab     Status: None   Collection Time: 10/16/22 11:44 PM   Specimen: Anterior Nasal Swab  Result Value Ref Range Status   SARS Coronavirus 2 by RT PCR NEGATIVE NEGATIVE Final    Comment: (NOTE) SARS-CoV-2 target nucleic acids are NOT DETECTED.  The SARS-CoV-2 RNA is generally detectable in upper and lower respiratory specimens during the acute phase of infection. The lowest concentration of SARS-CoV-2 viral copies this assay can detect is 250 copies / mL. A negative result does not preclude SARS-CoV-2 infection and should not be used as the sole basis for treatment or other patient management decisions.  A negative result may occur with improper specimen collection / handling, submission of specimen other than nasopharyngeal swab, presence of viral mutation(s) within the areas targeted by this assay, and inadequate number of viral copies (<250 copies / mL). A negative result must be combined with clinical observations, patient history, and epidemiological  information.  Fact Sheet for Patients:   RoadLapTop.co.za  Fact Sheet for Healthcare Providers: http://kim-miller.com/  This test is not yet approved or  cleared by the [place] FDA and has been authorized for detection and/or diagnosis of SARS-CoV-2 by FDA under an Emergency Use Authorization (EUA).  This EUA will remain in effect (meaning this test can be used) for the duration of the COVID-19 declaration under Section 564(b)(1) of the Act, 21 U.S.C. section 360bbb-3(b)(1), unless the authorization is terminated or revoked sooner.  Performed at [facility], [address] [place], [place] [address]   Blood Culture ID Panel (Reflexed)     Status: Abnormal   Collection Time: 10/16/22 11:44 PM  Result Value Ref Range Status   Enterococcus faecalis NOT DETECTED NOT  DETECTED Final   Enterococcus Faecium NOT DETECTED NOT DETECTED Final   Listeria monocytogenes NOT DETECTED NOT DETECTED Final   Staphylococcus species DETECTED (A) NOT DETECTED Final    Comment: CRITICAL RESULT CALLED TO, READ BACK BY AND VERIFIED WITH: NATHAN BELUE @ 2253 10/17/22 [facility]    Staphylococcus aureus (BCID) NOT DETECTED NOT DETECTED Final   Staphylococcus epidermidis DETECTED (A) NOT DETECTED Final    Comment: Methicillin (oxacillin) resistant coagulase negative staphylococcus. Possible blood culture contaminant (unless isolated from more than one blood culture draw or clinical case suggests pathogenicity). No antibiotic treatment is indicated for blood  culture contaminants. CRITICAL RESULT CALLED TO, READ BACK BY AND VERIFIED WITH: NATHAN BELUE @ 2253 10/17/22 [facility]    Staphylococcus lugdunensis NOT DETECTED NOT DETECTED Final   Streptococcus species NOT DETECTED NOT DETECTED Final   Streptococcus agalactiae NOT DETECTED NOT DETECTED Final   Streptococcus pneumoniae NOT DETECTED NOT DETECTED Final   Streptococcus pyogenes NOT DETECTED NOT DETECTED  Final   A.calcoaceticus-baumannii NOT DETECTED NOT DETECTED Final   Bacteroides fragilis NOT DETECTED NOT DETECTED Final   Enterobacterales NOT DETECTED NOT DETECTED Final   Enterobacter cloacae complex NOT DETECTED NOT DETECTED Final   Escherichia coli NOT DETECTED NOT DETECTED Final   Klebsiella aerogenes NOT DETECTED NOT DETECTED Final   Klebsiella oxytoca NOT DETECTED NOT DETECTED Final   Klebsiella pneumoniae NOT DETECTED NOT DETECTED Final   Proteus species NOT DETECTED NOT DETECTED Final   Salmonella species NOT DETECTED NOT DETECTED Final   Serratia marcescens NOT DETECTED NOT DETECTED Final   Haemophilus influenzae NOT DETECTED NOT DETECTED Final   Neisseria meningitidis NOT DETECTED NOT DETECTED Final   Pseudomonas aeruginosa NOT DETECTED NOT DETECTED Final   Stenotrophomonas maltophilia NOT DETECTED NOT DETECTED Final   Candida albicans NOT DETECTED NOT DETECTED Final   Candida auris NOT DETECTED NOT DETECTED Final   Candida glabrata NOT DETECTED NOT DETECTED Final   Candida krusei NOT DETECTED NOT DETECTED Final   Candida parapsilosis NOT DETECTED NOT DETECTED Final   Candida tropicalis NOT DETECTED NOT DETECTED Final   Cryptococcus neoformans/gattii NOT DETECTED NOT DETECTED Final   Methicillin resistance mecA/C DETECTED (A) NOT DETECTED Final    Comment: CRITICAL RESULT CALLED TO, READ BACK BY AND VERIFIED WITHDawayne Cirri @ 2253 10/17/22 [facility] Performed at [facility], [address] [place], [place] [address]   Organism ID, Bacteria     Status: None (Preliminary result)   Collection Time: 10/16/22 11:44 PM  Result Value Ref Range Status   Organism ID, Bacteria REFERT  Final    Comment: (NOTE) Please refer to the following specimen for additional lab results. Please refer to spec [phone] for test result. Lisbeth Renshaw notified 11-01-2022 at 09:55 AM. Performed At: [facility] [address] [place], [place] [id number] Jolene Schimke MD  ZD:[id number]    Source of Sample PENDING  Incomplete  Bacterial organism reflex     Status: None   Collection Time: 10/16/22 11:44 PM  Result Value Ref Range Status   Bacterial result 1 Comment  Final    Comment: (NOTE) Specimen has been received and testing has been initiated. Performed At: [facility] [address] [place], [place] [id number] Jolene Schimke MD AY:[id number]   Respiratory (~20 pathogens) panel by PCR     Status: None   Collection Time: 10/17/22  9:26 AM   Specimen: Nasopharyngeal Swab; Respiratory  Result Value Ref Range Status   Adenovirus NOT DETECTED NOT DETECTED Final  Coronavirus 229E NOT DETECTED NOT DETECTED Final    Comment: (NOTE) The Coronavirus on the Respiratory Panel, DOES NOT test for the novel  Coronavirus (2019 nCoV)    Coronavirus HKU1 NOT DETECTED NOT DETECTED Final   Coronavirus NL63 NOT DETECTED NOT DETECTED Final   Coronavirus OC43 NOT DETECTED NOT DETECTED Final   Metapneumovirus NOT DETECTED NOT DETECTED Final   Rhinovirus / Enterovirus NOT DETECTED NOT DETECTED Final   Influenza A NOT DETECTED NOT DETECTED Final   Influenza B NOT DETECTED NOT DETECTED Final   Parainfluenza Virus 1 NOT DETECTED NOT DETECTED Final   Parainfluenza Virus 2 NOT DETECTED NOT DETECTED Final   Parainfluenza Virus 3 NOT DETECTED NOT DETECTED Final   Parainfluenza Virus 4 NOT DETECTED NOT DETECTED Final   Respiratory Syncytial Virus NOT DETECTED NOT DETECTED Final   Bordetella pertussis NOT DETECTED NOT DETECTED Final   Bordetella Parapertussis NOT DETECTED NOT DETECTED Final   Chlamydophila pneumoniae NOT DETECTED NOT DETECTED Final   Mycoplasma pneumoniae NOT DETECTED NOT DETECTED Final    Comment: Performed at [facility], [address] [place], [place] [address]  MRSA Next Gen by PCR, Nasal     Status: None   Collection Time: 10/18/22 10:53 AM   Specimen: Nasal Mucosa; Nasal Swab  Result Value Ref Range Status   MRSA by PCR Next Gen  NOT DETECTED NOT DETECTED Final    Comment: (NOTE) The GeneXpert MRSA Assay (FDA approved for NASAL specimens only), is one component of a comprehensive MRSA colonization surveillance program. It is not intended to diagnose MRSA infection nor to guide or monitor treatment for MRSA infections. Test performance is not FDA approved in patients less than 80 years old. Performed at [facility], [address] [place], [place] [address]     Coagulation Studies: No results for input(s): "LABPROT", "INR" in the last 72 hours.   Urinalysis: No results for input(s): "COLORURINE", "LABSPEC", "PHURINE", "GLUCOSEU", "HGBUR", "BILIRUBINUR", "KETONESUR", "PROTEINUR", "UROBILINOGEN", "NITRITE", "LEUKOCYTESUR" in the last 72 hours.  Invalid input(s): "APPERANCEUR"    Imaging: No results found.   Medications:    anticoagulant sodium citrate      atorvastatin  40 mg Oral QHS   benztropine  2 mg Oral BID   busPIRone  15 mg Oral BID   Chlorhexidine Gluconate Cloth  6 each Topical Daily   cloZAPine  25 mg Oral BID   divalproex  1,000 mg Oral QHS   doxazosin  4 mg Oral Daily   epoetin (EPOGEN/PROCRIT) injection  10,000 Units Intravenous Q M,W,F-HD   feeding supplement (NEPRO CARB STEADY)  237 mL Oral TID BM   lactulose  30 g Oral QHS   levothyroxine  75 mcg Oral Q0600   lidocaine  10 mL Intradermal Once   multivitamin  1 tablet Oral QHS   nicotine  21 mg Transdermal Daily   mouth rinse  15 mL Mouth Rinse 4 times per day   pantoprazole  40 mg Oral Daily   traZODone  50 mg Oral QHS   valbenazine  80 mg Oral Daily   acetaminophen **OR** acetaminophen, albuterol, alteplase, alum & mag hydroxide-simeth, anticoagulant sodium citrate, bismuth subsalicylate, guaiFENesin, heparin, lidocaine (PF), lidocaine-prilocaine, loperamide, LORazepam, magnesium hydroxide, magnesium hydroxide, meclizine, methocarbamol, morphine injection, ondansetron **OR** [DISCONTINUED] ondansetron (ZOFRAN) IV,  mouth rinse, oxyCODONE-acetaminophen, pentafluoroprop-tetrafluoroeth, polyethylene glycol, sodium chloride  Assessment/ Plan:  Austin Marsh is a 67 y.o.  male with past medical history of hypothyroidism, depression with anxiety, schizoaffective, and  COPD, who was admitted to [facility] on 10/16/2022 for Lactic acidosis [E87.20] Elevated troponin level [R79.89] Demand ischemia [I24.89] NSTEMI (non-ST elevated myocardial infarction) (HCC) [I21.4] Acute kidney injury (HCC) [N17.9] Septic shock (HCC) [A41.9, R65.21] Sepsis due to undetermined organism (HCC) [A41.9] Altered mental status, unspecified altered mental status type [R41.82] SIRS (systemic inflammatory response syndrome) (HCC) [R65.10]   Acute kidney injury secondary to rhabdomyolysis.  Baseline creatinine of 1.17, GFR >60 on 06/04/2022. Requiring hemodialysis.   -Receiving treatment today, UF 1-1.5L as tolerated.   - Next treatment scheduled for Friday  - Renal navigator seeking outpatient dialysis clinic in [facility]  Lab Results  Component Value Date   CREATININE 3.81 (H) 11/03/2022   CREATININE 4.78 (H) 11/01/2022   CREATININE 5.34 (H) 10/30/2022    Intake/Output Summary (Last 24 hours) at 11/03/2022 1132 Last data filed at 11/03/2022 0800 Gross per 24 hour  Intake --  Output 450 ml  Net -450 ml   2. Anemia of acute blood loss:  Lab Results  Component Value Date   HGB 8.0 (L) 11/03/2022  - Hgb not at goal -Continue EPO with HD treatments.   3. Hypertension:  Regimen currently of doxazosin.  - Blood pressure 151/79 during dialysis   4. Hyponatremia: resolved       LOS: 17   7/17/202411:32 AM

## 2022-11-03 NOTE — Progress Notes (Signed)
  Received patient in bed to unit.   Informed consent signed and in chart.    TX duration: 3.5hrs     Transported back to floor  Hand-off given to patient's nurse. No c/o and no distress noted    Access used: R HD Catheter Access issues: none   Total UF removed: 1.5L Medication(s) given: 10,000u epogen Post HD VS: 160/77 Post HD weight: UTO     Lynann Beaver  [facility]

## 2022-11-04 DIAGNOSIS — R6521 Severe sepsis with septic shock: Secondary | ICD-10-CM | POA: Diagnosis not present

## 2022-11-04 DIAGNOSIS — A419 Sepsis, unspecified organism: Secondary | ICD-10-CM | POA: Diagnosis not present

## 2022-11-04 LAB — ORGANISM ID BY MALDI

## 2022-11-04 LAB — AEROBIC ID BY MALDI

## 2022-11-04 MED ORDER — EPOETIN ALFA 10000 UNIT/ML IJ SOLN: 10000.0000 [IU] | INTRAMUSCULAR | Status: AC

## 2022-11-04 MED ORDER — NICOTINE 21 MG/24HR TD PT24: 21.0000 mg | MEDICATED_PATCH | Freq: Every day | TRANSDERMAL | Status: AC

## 2022-11-04 MED ORDER — NEPRO/CARBSTEADY PO LIQD: 237.0000 mL | Freq: Three times a day (TID) | ORAL | Status: AC

## 2022-11-04 MED ORDER — RENA-VITE PO TABS: 1.0000 | ORAL_TABLET | Freq: Every day | ORAL | Status: AC

## 2022-11-04 NOTE — TOC Transition Note (Addendum)
Transition of Care [facility]) - CM/SW Discharge Note   Patient Details  Name: Austin Marsh MRN: [id number] Date of Birth: [birth year]  Transition of [facility]) CM/SW Contact:  Margarito Liner, LCSW Phone Number: 11/04/2022, 10:19 AM   Clinical Narrative: Patient has orders to discharge to [facility] today. RN will call report to [phone] (Room 210). EMS transport arranged for 2:00. No further concerns. CSW signing off.  11:30 am: Spoke to step-daughter and provided update.  Final next level of care: Skilled Nursing Facility Barriers to Discharge: Barriers Resolved   Patient Goals and CMS Choice   Choice offered to / list presented to : Patient, Adult Children  Discharge Placement PASRR number recieved: 10/29/22 PASRR number recieved: 10/29/22            Patient chooses bed at: [facility] Patient to be transferred to facility by: EMS Name of family member notified: Tried calling stepdaughter Lamar Laundry but no answer and voicemail is full. Will try again later. Patient and family notified of of transfer: 11/04/22  Discharge Plan and Services Additional resources added to the After Visit Summary for                                       Social Determinants of Health (SDOH) Interventions SDOH Screenings   Food Insecurity: No Food Insecurity (10/17/2022)  Housing: Patient Unable To Answer (10/17/2022)  Tobacco Use: High Risk (10/16/2022)     Readmission Risk Interventions    03/17/2020    1:48 PM  Readmission Risk Prevention Plan  Transportation Screening Complete  PCP or Specialist Appt within 3-5 Days --  HRI or Home Care Consult --  Palliative Care Screening Not Applicable  Medication Review (RN Care Manager) Complete

## 2022-11-04 NOTE — Discharge Summary (Addendum)
Physician Discharge Summary   Austin Marsh  male DOB: [birth year]  FXT:[id number]  PCP: Koren Bound, NP  Admit date: 10/16/2022 Discharge date: 11/04/2022  Admitted From: family care facility  Disposition:  SNF rehab CODE STATUS: Full code  Discharge Instructions     No wound care   Complete by: As directed       Hospital Course:  For full details, please see H&P, progress notes, consult notes and ancillary notes.  Briefly,  Austin Marsh is a 67 y.o. male with medical history significant of COPD, hypothyroidism, depression with anxiety, BPH, CKD-3A, schizoaffective disorder with bipolar, tardive dyskinesia, OSA on CPAP, who presented with fall and AMS.    On admission, pt was lethargic.  He was found to have acute urinary retention (973 cc on bladder scan).  Foley catheter was placed.  Fever of 101 in the ED.  Severe AKI with Cr 7.78.  Subsequently found to have severe rhabdomyolysis with CK peaking above 50k.  Due to trop elevation, cardiology consulted and heparin gtt started.   7/1 -- pt hypotensive, requiring IV fluid boluses.  Worsening encephalopathy, Hbg dropped to 6.3, large area of ecchymosis and swelling in the left neck & upper shoulder concerning for hematoma, later confirmed on CT check and neck soft tissue.  Pt was transferred to stepdown. PCCM consulted.     7/2 -- BP's improved after 3 units pRBC's, 1 FFP total.  Hbg improved.     7/3-7/8 patient's renal function continued to worsen and pt continued to remain altered, nephrologist made decision for hemodialysis initiation.  Patient's wife is in the psych unit and according to psychiatry lacks capacity to make decision for patient.    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  Cr on admission 7.78 AKI likely due to severe rhabdomyolysis, sepsis, dehydration, urinary retention secondary to bladder outlet obstruction. Had dialysis catheter placement on 10/23/2022 HD  initiated. R internal jugular Permcath placed 7/10 --outpatient dialysis arranged prior to discharge.   * Hypovolemic shock (HCC), resolved Ruled out septic shock Hypotension seemed more due to acute blood loss 2/2 left neck hematoma.  BP responded to fluids, blood products and midodrine, did not require pressors.  Sepsis shock --met criteria by fever, tachycardia, lactic acid >9.  Possible source, PNA.  RLL PNA/pneumonitis --CXR on presentation showed no active disease, however, on 7/1, CXR showed "Increased markings in right lower lung field may suggest atelectasis/pneumonia."  Suspect acute aspiration on 7/1 while encephalopathic.  Pt received 2 days of cefepime and 4 days of azithromycin.  Staph epidermidis bacteremia in 1 of bottle and gram-positive rod likely diptheroid as per lab.  ID consulted. Both are skin bacteria and contamination Is highly likely. --Received 6 days of linezolid.  Acute respiratory failure with hypoxia (HCC), not POA suspect acute aspiration event on 7/1. Antibiotics completed and discontinued by ID on 10/22/2022 Weaned off O2 and stable on room air   Hematoma Left Neck/Shoulder  Acute Blood Loss Anemia 7/1 - Hbg dropped from 11.5 >> 6.3 and pt noted to have new swelling and ecchymosis at the left neck & upper shoulder.  --received total 4u pRBC.  Hgb stable around 8's prior to discharge.   Rhabdomyolysis CK peaked > 50000 Given aggressive IV hydration for this and hypotension.  CK normalized to 373 on 7/12.     Acute metabolic encephalopathy CT head on admission negative.  Multifactorial due to metabolic derangements, underlying psych conditions, sedating meds  at home. --all home psych med resumed prior to discharge.  Gabapentin was not resumed during this hospitalization and was d/c'ed at discharge, in order to reduce sedative medication in the setting of his new ESRD, and significant home psych med load.   Acute retention of urine - resolved Hx of  BPH Anuria due to renal failure Foley placed on admission. 7/13 -- dc foley, voiding trial In/out cath was done overnight, only 60 cc yielded 7/14 -- not voiding, but minimal urine on bladder scans   NSTEMI, ruled out Elevated troponin - demand ischemia & delayed clearance with severe AKI. --Cardiology was consulted, signed off --IV heparin stopped 7/1 AM with acute anemia and hematoma   Fall Unknown cause, pt was found on the floor at his family care home, per caregiver's history on admission. CT of head negative for acute intracranial abnormalities.   CT Chest/Abd/Pelvis: No acute intrathoracic or intra-abdominal pathology identified.  X-ray of bilateral hips/pelvis negative for fracture.   X-ray of L-spine and T-spine negative for acute fracture, did showed degenerative disc disease  --Neck hematoma developed 7/1 Confirmed on CT chest and neck soft tissue obtained later on 7/1 PM --SNF rehab   Hyponatremia- resolved   Schizoaffective disorder, bipolar type (HCC) Depression with anxiety --cont home Benztropine, BuSpar, clozapine, Depakote, Ingrezza    COPD (chronic obstructive pulmonary disease) (HCC) Stable   Hypothyroidism Continue Synthroid   Tobacco abuse --cont nicotine patch   Discharge Diagnoses:  Active Problems:   Acute kidney injury (HCC)   Acute metabolic encephalopathy   Rhabdomyolysis   Hematoma   Bacteremia due to Staphylococcus   Acute respiratory failure with hypoxia (HCC)   Acute retention of urine   NSTEMI (non-ST elevated myocardial infarction) (HCC)   Fall   COPD (chronic obstructive pulmonary disease) (HCC)   Hypothyroidism   Hyponatremia   BPH (benign prostatic hyperplasia)   Acute renal failure superimposed on stage 3a chronic kidney disease (HCC)   Depression with anxiety   Schizoaffective disorder, bipolar type (HCC)   Tobacco abuse   Altered mental status   Demand ischemia   Elevated troponin level   Lactic acidosis   ESRD on  dialysis (HCC)    30 Day Unplanned Readmission Risk Score    Flowsheet Row ED to Hosp-Admission (Current) from 10/16/2022 in [facility]  30 Day Unplanned Readmission Risk Score (%) 41.09 Filed at 11/04/2022 0801       This score is the patient's risk of an unplanned readmission within 30 days of being discharged (0 -100%). The score is based on dignosis, age, lab data, medications, orders, and past utilization.   Low:  0-14.9   Medium: 15-21.9   High: 22-29.9   Extreme: 30 and above         Discharge Instructions:  Allergies as of 11/04/2022       Reactions   Haldol [haloperidol Lactate] Other (See Comments)   Tardive diskonesia   Other    Navy Beans cause patient to vomit   Thorazine [chlorpromazine] Other (See Comments)   "messes me all up"   Trazodone And Nefazodone Other (See Comments)   "makes me feel like I'm dying."        Medication List     STOP taking these medications    gabapentin 300 MG capsule Commonly known as: NEURONTIN   glycopyrrolate 1 MG tablet Commonly known as: ROBINUL       TAKE these medications    acetaminophen 325 MG  tablet Commonly known as: TYLENOL Take 650 mg by mouth every 4 (four) hours as needed.   albuterol 108 (90 Base) MCG/ACT inhaler Commonly known as: VENTOLIN HFA Inhale 2 puffs into the lungs every 4 (four) hours as needed for wheezing or shortness of breath.   alum & mag hydroxide-simeth 200-200-20 MG/5ML suspension Commonly known as: MAALOX/MYLANTA Take 30 mLs by mouth daily as needed for indigestion or heartburn.   benztropine 2 MG tablet Commonly known as: COGENTIN Take 2 mg by mouth 2 (two) times daily.   bismuth subsalicylate 262 MG/15ML suspension Commonly known as: PEPTO BISMOL Take 15 mLs by mouth every 4 (four) hours as needed.   busPIRone 15 MG tablet Commonly known as: BUSPAR Take 15 mg by mouth 2 (two) times daily.   cloZAPine 100 MG tablet Commonly known  as: CLOZARIL Take 100-200 mg by mouth 2 (two) times daily. 100 mg qam 200 mg qpm   divalproex 500 MG 24 hr tablet Commonly known as: DEPAKOTE ER Take 1,000 mg by mouth at bedtime.   doxazosin 4 MG tablet Commonly known as: CARDURA Take 4 mg by mouth daily.   epoetin alfa 16109 UNIT/ML injection Commonly known as: EPOGEN Inject 1 mL (10,000 Units total) into the vein every Monday, Wednesday, and Friday with hemodialysis. Start taking on: November 05, 2022   feeding supplement (NEPRO CARB STEADY) Liqd Take 237 mLs by mouth 3 (three) times daily between meals.   guaiFENesin 100 MG/5ML liquid Commonly known as: ROBITUSSIN Take 200 mg by mouth every 6 (six) hours as needed for cough.   Ingrezza 80 MG capsule Generic drug: valbenazine Take 80 mg by mouth daily.   levothyroxine 100 MCG tablet Commonly known as: SYNTHROID Take 75 mcg by mouth daily before breakfast.   loperamide 2 MG capsule Commonly known as: IMODIUM Take 2 mg by mouth as needed for diarrhea or loose stools.   magnesium hydroxide 400 MG/5ML suspension Commonly known as: MILK OF MAGNESIA Take 30 mLs by mouth daily as needed for mild constipation.   meclizine 25 MG tablet Commonly known as: ANTIVERT Take 25 mg by mouth 3 (three) times daily as needed for dizziness.   multivitamin Tabs tablet Take 1 tablet by mouth at bedtime.   nicotine 21 mg/24hr patch Commonly known as: NICODERM CQ - dosed in mg/24 hours Place 1 patch (21 mg total) onto the skin daily. Start taking on: November 05, 2022   pantoprazole 40 MG tablet Commonly known as: PROTONIX Take 40 mg by mouth daily.   sennosides-docusate sodium 8.6-50 MG tablet Commonly known as: SENOKOT-S Take 1 tablet by mouth in the morning and at bedtime.   traZODone 50 MG tablet Commonly known as: DESYREL Take 50 mg by mouth at bedtime.   vitamin B-12 100 MCG tablet Commonly known as: CYANOCOBALAMIN Take 100 mcg by mouth daily.   Vitamin D (Cholecalciferol)  25 MCG (1000 UT) Tabs Take 50 mcg by mouth daily.         Contact information for follow-up providers     Koren Bound, NP Follow up.   Specialty: Nurse Practitioner Contact information: [address] [place] [address] [phone]              Contact information for after-discharge care     Destination     [facility] Preferred SNF .   Service: Skilled Paramedic information: [address] [place] [address] [phone]  Allergies  Allergen Reactions   Haldol [Haloperidol Lactate] Other (See Comments)    Tardive diskonesia   Other     Navy Beans cause patient to vomit   Thorazine [Chlorpromazine] Other (See Comments)    "messes me all up"   Trazodone And Nefazodone Other (See Comments)    "makes me feel like I'm dying."     The results of significant diagnostics from this hospitalization (including imaging, microbiology, ancillary and laboratory) are listed below for reference.   Consultations:   Procedures/Studies: PERIPHERAL VASCULAR CATHETERIZATION  Result Date: 10/27/2022 See surgical note for result.  CT SOFT TISSUE NECK WO CONTRAST  Result Date: 10/18/2022 CLINICAL DATA:  Left neck swelling EXAM: CT NECK WITHOUT CONTRAST TECHNIQUE: Multidetector CT imaging of the neck was performed following the standard protocol without intravenous contrast. RADIATION DOSE REDUCTION: This exam was performed according to the departmental dose-optimization program which includes automated exposure control, adjustment of the mA and/or kV according to patient size and/or use of iterative reconstruction technique. COMPARISON:  None Available. FINDINGS: Pharynx and larynx: Normal. No mass or swelling. Salivary glands: No inflammation, mass, or stone. Thyroid: Normal. Lymph nodes: None enlarged or abnormal density. Vascular: Negative. Limited intracranial: Negative. Visualized  orbits: Negative. Mastoids and visualized paranasal sinuses: Clear. Skeleton: No acute or aggressive process. Upper chest: Left lung dependent atelectasis. Other: Marked enlargement of the left trapezius muscle within the neck and upper back with moderate surrounding inflammatory stranding. The left levator scapula is also enlarged. These findings likely indicate intramuscular hematomas. IMPRESSION: Marked enlargement of the left trapezius and levator scapula with surrounding fat stranding, likely intramuscular hematomas. Electronically Signed   By: Deatra Robinson M.D.   On: 10/18/2022 20:55   CT CHEST WO CONTRAST  Result Date: 10/18/2022 CLINICAL DATA:  Left shoulder/neck swelling and ecchymosis, drop in hemoglobin EXAM: CT CHEST WITHOUT CONTRAST TECHNIQUE: Multidetector CT imaging of the chest was performed following the standard protocol without IV contrast. RADIATION DOSE REDUCTION: This exam was performed according to the departmental dose-optimization program which includes automated exposure control, adjustment of the mA and/or kV according to patient size and/or use of iterative reconstruction technique. COMPARISON:  CT chest dated 10/17/2022 FINDINGS: Cardiovascular: The heart is normal in size. No pericardial effusion. No evidence of thoracic aortic aneurysm. Mild atherosclerotic calcifications of the aortic arch. Moderate coronary atherosclerosis of the LAD and right coronary artery. Mediastinum/Nodes: No suspicious mediastinal lymphadenopathy. Visualized thyroid is unremarkable. Lungs/Pleura: Debris in the right lower lobe bronchus (series 3/image 34), new from recent prior, with new right lower lobe atelectasis/collapse. This appearance favors mucous plugging or possibly interval aspiration. Trace bilateral pleural effusions, new. Mild linear scarring with subpleural reticulation in the lungs bilaterally. Although evaluation is constrained by respiratory motion, there are no suspicious pulmonary  nodules. No pneumothorax. Upper Abdomen: Visualized upper abdomen is notable for a carious excretion of contrast, small hepatic cysts, in stable low-density adrenal thickening with a 10 mm adrenal adenoma on the right (series 3/image 133). Musculoskeletal: New intramuscular hematoma with enlargement of the left levator scapulae and trapezius muscles (series 3/image 1), incompletely visualized. Correlate with pending CT neck. Additional subcutaneous stranding in the left supraclavicular region and left upper back, likely related to subcutaneous hemorrhage. Visualized osseous structures are within normal limits. IMPRESSION: New intramuscular hematoma with enlargement of the left levator scapulae and trapezius muscles, incompletely visualized. Additional mild subcutaneous stranding/hemorrhage, as above. Correlate with pending CT neck. Debris in the right lower lobe bronchus, new from recent prior, with new  right lower lobe atelectasis/collapse. This appearance favors mucous plugging or possibly interval aspiration. Trace bilateral pleural effusions, new. Aortic Atherosclerosis (ICD10-I70.0). Electronically Signed   By: Charline Bills M.D.   On: 10/18/2022 20:52   US Abdomen Limited RUQ (LIVER/GB)  Result Date: 10/18/2022 CLINICAL DATA:  Liver enzymes EXAM: ULTRASOUND ABDOMEN LIMITED RIGHT UPPER QUADRANT COMPARISON:  CT 10/16/2022 FINDINGS: Gallbladder: No gallstones or wall thickening visualized. No sonographic Murphy sign noted by sonographer. Common bile duct: Diameter: 4 mm Liver: Diffusely echogenic hepatic parenchyma consistent with fatty liver infiltration. Small hepatic cysts identified measuring 7 x 8 x 7 mm. This is simple appearing. No specific follow-up. Portal vein is patent on color Doppler imaging with normal direction of blood flow towards the liver. Other: None. IMPRESSION: No gallstones or ductal dilatation. Fatty liver infiltration. Hepatic cyst seen. Please correlate with prior CT scan with  contrast Electronically Signed   By: Karen Kays M.D.   On: 10/18/2022 16:20   ECHOCARDIOGRAM COMPLETE  Result Date: 10/18/2022    ECHOCARDIOGRAM REPORT   Patient Name:   Austin Marsh Date of Exam: 10/18/2022 Medical Rec #:  [id number]     Height:       69.0 in Accession #:    [id number]    Weight:       193.8 lb Date of Birth:  1956-02-20    BSA:          2.038 m Patient Age:    66 years      BP:           89/55 mmHg Patient Gender: M             HR:           95 bpm. Exam Location:  [facility] Procedure: 2D Echo, Cardiac Doppler and Color Doppler Indications:     NSTEMI I21.4  History:         Patient has no prior history of Echocardiogram examinations.                  COPD; Risk Factors:Sleep Apnea.  Sonographer:     Cristela Blue Referring Phys:  [id number] Brien Few NIU Diagnosing Phys: Marcina Millard MD  Sonographer Comments: No apical window and no subcostal window. Image acquisition challenging due to COPD. IMPRESSIONS  1. Left ventricular ejection fraction, by estimation, is >75%. The left ventricle has hyperdynamic function. The left ventricle has no regional wall motion abnormalities. Left ventricular diastolic parameters were normal.  2. Right ventricular systolic function is normal. The right ventricular size is normal.  3. The mitral valve is normal in structure. Trivial mitral valve regurgitation. No evidence of mitral stenosis.  4. The aortic valve is normal in structure. Aortic valve regurgitation is not visualized. No aortic stenosis is present.  5. The inferior vena cava is normal in size with greater than 50% respiratory variability, suggesting right atrial pressure of 3 mmHg. FINDINGS  Left Ventricle: Left ventricular ejection fraction, by estimation, is >75%. The left ventricle has hyperdynamic function. The left ventricle has no regional wall motion abnormalities. The left ventricular internal cavity size was normal in size. There is borderline left ventricular hypertrophy. Left ventricular diastolic  parameters were normal. Right Ventricle: The right ventricular size is normal. No increase in right ventricular wall thickness. Right ventricular systolic function is normal. Left Atrium: Left atrial size was normal in size. Right Atrium: Right atrial size was normal in size. Pericardium: There is no evidence of pericardial effusion. Mitral Valve: The mitral  valve is normal in structure. Trivial mitral valve regurgitation. No evidence of mitral valve stenosis. Tricuspid Valve: The tricuspid valve is normal in structure. Tricuspid valve regurgitation is trivial. No evidence of tricuspid stenosis. Aortic Valve: The aortic valve is normal in structure. Aortic valve regurgitation is not visualized. No aortic stenosis is present. Pulmonic Valve: The pulmonic valve was normal in structure. Pulmonic valve regurgitation is not visualized. No evidence of pulmonic stenosis. Aorta: The aortic root is normal in size and structure. Venous: The inferior vena cava is normal in size with greater than 50% respiratory variability, suggesting right atrial pressure of 3 mmHg. IAS/Shunts: No atrial level shunt detected by color flow Doppler.  LEFT VENTRICLE PLAX 2D LVIDd:         3.40 cm LVIDs:         2.00 cm LV PW:         1.10 cm LV IVS:        1.20 cm LVOT diam:     1.85 cm LVOT Area:     2.69 cm  LEFT ATRIUM         Index LA diam:    2.40 cm 1.18 cm/m   AORTA Ao Root diam: 2.50 cm  SHUNTS Systemic Diam: 1.85 cm Marcina Millard MD Electronically signed by Marcina Millard MD Signature Date/Time: 10/18/2022/1:34:51 PM    Final    DG Chest Port 1 View  Result Date: 10/18/2022 CLINICAL DATA:  Respiratory failure EXAM: PORTABLE CHEST 1 VIEW COMPARISON:  Previous studies including the examination of 10/16/2022 FINDINGS: Transverse diameter of heart is increased. Apparent shift of mediastinum to the right may be due to rotation. Increased markings are seen in right lower lung field. Right lateral CP angle is indistinct. Left  lateral CP angle is clear. There is no pneumothorax. There is marked soft tissue swelling in the left supraclavicular region. IMPRESSION: Increased markings in right lower lung field may suggest atelectasis/pneumonia. Possible small right pleural effusion. There is soft tissue prominence in left supraclavicular region. Possibility of large hematoma in the left side of neck is not excluded. Please correlate with physical examination findings and consider CT neck. These results will be called to the ordering clinician or representative by the Radiologist Assistant, and communication documented in the PACS or [employer]. Electronically Signed   By: Ernie Avena M.D.   On: 10/18/2022 12:42   DG Lumbar Spine 2-3 Views  Result Date: 10/17/2022 CLINICAL DATA:  Recent fall.  Pain. EXAM: LUMBAR SPINE - 2-3 VIEW COMPARISON:  CT abdomen and pelvis 06/01/2021 FINDINGS: There are 5 non-rib-bearing lumbar-type vertebral bodies. No sagittal spondylolisthesis. Minimal posterior L5-S1 disc space narrowing. Moderate L5-S1 greater than L4-5 facet joint sclerosis and hypertrophy. Vertebral body heights are maintained. Moderate atherosclerotic calcifications. IMPRESSION: 1. Minimal posterior L5-S1 disc space narrowing. 2. Moderate L5-S1 greater than L4-5 facet degenerative changes. Electronically Signed   By: Neita Garnet M.D.   On: 10/17/2022 12:20   DG Thoracic Spine 2 View  Result Date: 10/17/2022 CLINICAL DATA:  Recent fall.  Pain. EXAM: THORACIC SPINE 2 VIEWS COMPARISON:  AP chest 10/16/2022, chest two views 05/08/2020 FINDINGS: There are 12 rib-bearing thoracic type vertebral bodies. Normal frontal alignment. No sagittal spondylolisthesis. Mild multilevel anterior disc space narrowing of the upper thoracic spine. Vertebral body heights are maintained. The visualized heart and lungs are grossly unremarkable. IMPRESSION: Mild multilevel degenerative changes of the upper thoracic spine. Electronically Signed    By: Neita Garnet M.D.   On: 10/17/2022 12:17  DG HIPS BILAT WITH PELVIS MIN 5 VIEWS  Result Date: 10/17/2022 CLINICAL DATA:  Recent fall.  Pain. EXAM: DG HIP (WITH OR WITHOUT PELVIS) 5+V BILAT COMPARISON:  Pelvis and left hip radiographs 03/11/2020, CT pelvis 06/01/2021 FINDINGS: There is diffuse decreased bone mineralization. Mild bilateral femoroacetabular joint space narrowing moderate bilateral superolateral acetabular degenerative osteophytes. The bilateral sacroiliac joint spaces are maintained. No acute fracture is seen. No dislocation. IMPRESSION: Mild bilateral femoroacetabular osteoarthritis. No acute fracture. Electronically Signed   By: Neita Garnet M.D.   On: 10/17/2022 12:15   CT Head Wo Contrast  Result Date: 10/17/2022 CLINICAL DATA:  Mental status change, unknown cause; Sepsis EXAM: CT HEAD WITHOUT CONTRAST CT CHEST, ABDOMEN AND PELVIS WITH CONTRAST TECHNIQUE: Contiguous axial images were obtained from the base of the skull through the vertex without intravenous contrast. Multidetector CT imaging of the chest, abdomen and pelvis was performed following the standard protocol during bolus administration of intravenous contrast. RADIATION DOSE REDUCTION: This exam was performed according to the departmental dose-optimization program which includes automated exposure control, adjustment of the mA and/or kV according to patient size and/or use of iterative reconstruction technique. CONTRAST:  80mL OMNIPAQUE IOHEXOL 300 MG/ML  SOLN COMPARISON:  CT head 05/09/2020, CT chest abdomen pelvis 09/08/2020 FINDINGS: CT HEAD FINDINGS Brain: Normal anatomic configuration. Parenchymal volume loss is commensurate with the patient's age. No abnormal intra or extra-axial mass lesion or fluid collection. No abnormal mass effect or midline shift. No evidence of acute intracranial hemorrhage or infarct. Ventricular size is normal. Cerebellum unremarkable. Vascular: No asymmetric hyperdense vasculature at the  skull base. Skull: Intact Sinuses/Orbits: Paranasal sinuses are clear. Orbits are unremarkable. Other: Mastoid air cells and middle ear cavities are clear. CT CHEST FINDINGS Cardiovascular: Moderate coronary artery calcification within the left anterior descending coronary artery proximally. Global cardiac size within normal limits. No pericardial effusion. Central pulmonary arteries are of normal caliber. Mild atherosclerotic calcification within the thoracic aorta. No aortic aneurysm. Mediastinum/Nodes: No enlarged mediastinal, hilar, or axillary lymph nodes. Thyroid gland, trachea, and esophagus demonstrate no significant findings. Lungs/Pleura: Stable architectural distortion within the upper lobes bilaterally in keeping with probable post infectious or post inflammatory scarring. No superimposed confluent pulmonary infiltrate. Bibasilar atelectasis. No pneumothorax or pleural effusion. No central obstructing lesion. Musculoskeletal: No acute bone abnormality. No lytic or blastic bone lesion. CT ABDOMEN PELVIS FINDINGS Hepatobiliary: Stable scattered probable cysts within the liver. No enhancing intrahepatic mass. Gallbladder unremarkable. No intra or extrahepatic biliary ductal dilation. Pancreas: Unremarkable Spleen: Unremarkable Adrenals/Urinary Tract: Stable thickening of the adrenal glands bilaterally in keeping with adrenal hyperplasia. Stable 9 mm low-attenuation nodule within the right adrenal gland in keeping with an adrenal adenoma. No follow-up imaging is recommended for this lesion. The kidneys are normal in size and position. Multiple simple cortical cysts are seen within the kidneys bilaterally for which no follow-up imaging is recommended. The kidneys are otherwise unremarkable. The bladder is distended with a punctate focus of intraluminal gas identified possibly related to recent catheterization. Stomach/Bowel: Appendectomy has been performed. The stomach, small bowel, and large bowel are  unremarkable. No evidence of obstruction or focal inflammation. No free intraperitoneal gas or fluid. Vascular/Lymphatic: Aortic atherosclerosis. No enlarged abdominal or pelvic lymph nodes. Reproductive: Mild prostatic hypertrophy. Defect within the central prostate gland likely reflects changes of prior trans urethral resection. Other: No abdominal wall hernia or abnormality. No abdominopelvic ascites. Musculoskeletal: No acute bone abnormality. No lytic or blastic bone lesion. IMPRESSION: 1. No acute intracranial abnormality. No calvarial  fracture. 2. Moderate coronary artery calcification. 3. No acute intrathoracic or intra-abdominal pathology identified. 4. Stable 9 mm right adrenal adenoma. Stable changes of adrenal hyperplasia. 5. Marked distention of the bladder possibly reflecting changes of bladder outlet obstruction. 6. Mild prostatic hypertrophy.  Probable post TURP changes. Aortic Atherosclerosis (ICD10-I70.0). Electronically Signed   By: Helyn Numbers M.D.   On: 10/17/2022 03:13   CT CHEST ABDOMEN PELVIS W CONTRAST  Result Date: 10/17/2022 CLINICAL DATA:  Mental status change, unknown cause; Sepsis EXAM: CT HEAD WITHOUT CONTRAST CT CHEST, ABDOMEN AND PELVIS WITH CONTRAST TECHNIQUE: Contiguous axial images were obtained from the base of the skull through the vertex without intravenous contrast. Multidetector CT imaging of the chest, abdomen and pelvis was performed following the standard protocol during bolus administration of intravenous contrast. RADIATION DOSE REDUCTION: This exam was performed according to the departmental dose-optimization program which includes automated exposure control, adjustment of the mA and/or kV according to patient size and/or use of iterative reconstruction technique. CONTRAST:  80mL OMNIPAQUE IOHEXOL 300 MG/ML  SOLN COMPARISON:  CT head 05/09/2020, CT chest abdomen pelvis 09/08/2020 FINDINGS: CT HEAD FINDINGS Brain: Normal anatomic configuration. Parenchymal volume  loss is commensurate with the patient's age. No abnormal intra or extra-axial mass lesion or fluid collection. No abnormal mass effect or midline shift. No evidence of acute intracranial hemorrhage or infarct. Ventricular size is normal. Cerebellum unremarkable. Vascular: No asymmetric hyperdense vasculature at the skull base. Skull: Intact Sinuses/Orbits: Paranasal sinuses are clear. Orbits are unremarkable. Other: Mastoid air cells and middle ear cavities are clear. CT CHEST FINDINGS Cardiovascular: Moderate coronary artery calcification within the left anterior descending coronary artery proximally. Global cardiac size within normal limits. No pericardial effusion. Central pulmonary arteries are of normal caliber. Mild atherosclerotic calcification within the thoracic aorta. No aortic aneurysm. Mediastinum/Nodes: No enlarged mediastinal, hilar, or axillary lymph nodes. Thyroid gland, trachea, and esophagus demonstrate no significant findings. Lungs/Pleura: Stable architectural distortion within the upper lobes bilaterally in keeping with probable post infectious or post inflammatory scarring. No superimposed confluent pulmonary infiltrate. Bibasilar atelectasis. No pneumothorax or pleural effusion. No central obstructing lesion. Musculoskeletal: No acute bone abnormality. No lytic or blastic bone lesion. CT ABDOMEN PELVIS FINDINGS Hepatobiliary: Stable scattered probable cysts within the liver. No enhancing intrahepatic mass. Gallbladder unremarkable. No intra or extrahepatic biliary ductal dilation. Pancreas: Unremarkable Spleen: Unremarkable Adrenals/Urinary Tract: Stable thickening of the adrenal glands bilaterally in keeping with adrenal hyperplasia. Stable 9 mm low-attenuation nodule within the right adrenal gland in keeping with an adrenal adenoma. No follow-up imaging is recommended for this lesion. The kidneys are normal in size and position. Multiple simple cortical cysts are seen within the kidneys  bilaterally for which no follow-up imaging is recommended. The kidneys are otherwise unremarkable. The bladder is distended with a punctate focus of intraluminal gas identified possibly related to recent catheterization. Stomach/Bowel: Appendectomy has been performed. The stomach, small bowel, and large bowel are unremarkable. No evidence of obstruction or focal inflammation. No free intraperitoneal gas or fluid. Vascular/Lymphatic: Aortic atherosclerosis. No enlarged abdominal or pelvic lymph nodes. Reproductive: Mild prostatic hypertrophy. Defect within the central prostate gland likely reflects changes of prior trans urethral resection. Other: No abdominal wall hernia or abnormality. No abdominopelvic ascites. Musculoskeletal: No acute bone abnormality. No lytic or blastic bone lesion. IMPRESSION: 1. No acute intracranial abnormality. No calvarial fracture. 2. Moderate coronary artery calcification. 3. No acute intrathoracic or intra-abdominal pathology identified. 4. Stable 9 mm right adrenal adenoma. Stable changes of adrenal hyperplasia. 5.  Marked distention of the bladder possibly reflecting changes of bladder outlet obstruction. 6. Mild prostatic hypertrophy.  Probable post TURP changes. Aortic Atherosclerosis (ICD10-I70.0). Electronically Signed   By: Helyn Numbers M.D.   On: 10/17/2022 03:13   DG Chest Port 1 View  Result Date: 10/17/2022 CLINICAL DATA:  Sepsis, found down EXAM: PORTABLE CHEST 1 VIEW COMPARISON:  12/11/2021 FINDINGS: The heart size and mediastinal contours are within normal limits. Both lungs are clear. The visualized skeletal structures are unremarkable. IMPRESSION: No active disease. Electronically Signed   By: Helyn Numbers M.D.   On: 10/17/2022 00:15      Labs: BNP (last 3 results) Recent Labs    10/18/22 1135  BNP 80.0   Basic Metabolic Panel: Recent Labs  Lab 10/29/22 0635 10/30/22 0911 11/01/22 0526 11/03/22 0947  NA 137 135 133* 130*  K 3.8 4.1 4.5 4.9  CL  100 98 97* 95*  CO2 29 29 28 29   GLUCOSE 100* 89 97 90  BUN 48* 64* 58* 39*  CREATININE 4.39* 5.34* 4.78* 3.81*  CALCIUM 8.3* 8.2* 8.6* 8.2*  MG 1.7  --   --   --   PHOS 3.2 4.0 5.2* 4.6   Liver Function Tests: Recent Labs  Lab 10/30/22 0911 11/01/22 0526 11/03/22 0947  ALBUMIN 2.2* 2.7* 2.5*   No results for input(s): "LIPASE", "AMYLASE" in the last 168 hours. No results for input(s): "AMMONIA" in the last 168 hours. CBC: Recent Labs  Lab 10/30/22 0615 10/31/22 0523 11/01/22 0853 11/02/22 0625 11/03/22 0950  WBC 12.1* 13.5* 10.9* 9.2 9.4  NEUTROABS  --  9.1*  --   --   --   HGB 8.2* 8.1* 7.7* 8.5* 8.0*  HCT 24.0* 23.2* 23.5* 24.9* 24.1*  MCV 89.6 89.9 92.9 91.2 92.7  PLT 122* 139* 177 204 240   Cardiac Enzymes: Recent Labs  Lab 10/29/22 0635  CKTOTAL 373   BNP: Invalid input(s): "POCBNP" CBG: No results for input(s): "GLUCAP" in the last 168 hours. D-Dimer No results for input(s): "DDIMER" in the last 72 hours. Hgb A1c No results for input(s): "HGBA1C" in the last 72 hours. Lipid Profile No results for input(s): "CHOL", "HDL", "LDLCALC", "TRIG", "CHOLHDL", "LDLDIRECT" in the last 72 hours. Thyroid function studies No results for input(s): "TSH", "T4TOTAL", "T3FREE", "THYROIDAB" in the last 72 hours.  Invalid input(s): "FREET3" Anemia work up No results for input(s): "VITAMINB12", "FOLATE", "FERRITIN", "TIBC", "IRON", "RETICCTPCT" in the last 72 hours. Urinalysis    Component Value Date/Time   COLORURINE YELLOW (A) 10/16/2022 2344   APPEARANCEUR CLEAR (A) 10/16/2022 2344   APPEARANCEUR Clear 02/17/2012 1315   LABSPEC 1.008 10/16/2022 2344   LABSPEC 1.018 02/17/2012 1315   PHURINE 6.0 10/16/2022 2344   GLUCOSEU NEGATIVE 10/16/2022 2344   GLUCOSEU Negative 02/17/2012 1315   HGBUR NEGATIVE 10/16/2022 2344   BILIRUBINUR NEGATIVE 10/16/2022 2344   BILIRUBINUR Negative 02/17/2012 1315   KETONESUR NEGATIVE 10/16/2022 2344   PROTEINUR NEGATIVE 10/16/2022  2344   UROBILINOGEN 0.2 03/15/2007 2130   NITRITE NEGATIVE 10/16/2022 2344   LEUKOCYTESUR NEGATIVE 10/16/2022 2344   LEUKOCYTESUR Negative 02/17/2012 1315   Sepsis Labs Recent Labs  Lab 10/31/22 0523 11/01/22 0853 11/02/22 0625 11/03/22 0950  WBC 13.5* 10.9* 9.2 9.4   Microbiology No results found for this or any previous visit (from the past 240 hour(s)).   Total time spend on discharging this patient, including the last patient exam, discussing the hospital stay, instructions for ongoing care as it relates to all  pertinent caregivers, as well as preparing the medical discharge records, prescriptions, and/or referrals as applicable, is 40 minutes.    Darlin Priestly, MD  [facility] 11/04/2022, 9:17 AM

## 2022-11-04 NOTE — TOC Progression Note (Signed)
Transition of Care [facility]) - Progression Note    Patient Details  Name: Austin Marsh MRN: [id number] Date of Birth: 11-Nov-1955  [facility]) CM/SW Contact  Margarito Liner, LCSW Phone Number: 11/04/2022, 8:54 AM  Clinical Narrative:   [facility] can accept patient today. They will need discharge summary early due to planned internet downtime this afternoon. MD is aware. Per admissions coordinator, stepdaughter will be there around 1:30 to complete admissions paperwork. CSW called and updated APS social worker Magazine features editor.  Expected Discharge Plan and Services                                               Social Determinants of Health (SDOH) Interventions SDOH Screenings   Food Insecurity: No Food Insecurity (10/17/2022)  Housing: Patient Unable To Answer (10/17/2022)  Tobacco Use: High Risk (10/16/2022)    Readmission Risk Interventions    03/17/2020    1:48 PM  Readmission Risk Prevention Plan  Transportation Screening Complete  PCP or Specialist Appt within 3-5 Days --  HRI or Home Care Consult --  Palliative Care Screening Not Applicable  Medication Review (RN Care Manager) Complete

## 2022-11-04 NOTE — Progress Notes (Signed)
[facility]  ROUNDING NOTE   Subjective:   Patient sitting up in bed Eating breakfast Denies pain or discomfort  Possible discharge today  Objective:  Vital signs in last 24 hours:  Temp:  [97.6 F (36.4 C)-99 F (37.2 C)] 97.6 F (36.4 C) (07/18 0838) Pulse Rate:  [74-79] 79 (07/18 0838) Resp:  [15-20] 19 (07/18 0838) BP: (119-159)/(61-98) 147/77 (07/18 0838) SpO2:  [98 %-99 %] 98 % (07/18 0838) Weight:  [101.5 kg] 101.5 kg (07/18 0500)  Weight change: 0.5 kg Filed Weights   11/02/22 0500 11/03/22 0500 11/04/22 0500  Weight: 99.9 kg 101 kg 101.5 kg    Intake/Output: I/O last 3 completed shifts: In: 430 [P.O.:430] Out: 2150 [Urine:650; Other:1500]   Intake/Output this shift:  No intake/output data recorded.  Physical Exam: General: No acute distress  Head: Normocephalic, atraumatic. moist oral mucosal membranes  Eyes: Anicteric  Lungs:  Clear to auscultation, normal effort  Heart: Regular rate and rhythm  Abdomen:  Soft, nontender  Extremities: 1+ generalized edema.  Neurologic: Awake, alert  Skin: No rashes  Access: Rt IJ permcath        Basic Metabolic Panel: Recent Labs  Lab 10/29/22 0635 10/30/22 0911 11/01/22 0526 11/03/22 0947  NA 137 135 133* 130*  K 3.8 4.1 4.5 4.9  CL 100 98 97* 95*  CO2 29 29 28 29   GLUCOSE 100* 89 97 90  BUN 48* 64* 58* 39*  CREATININE 4.39* 5.34* 4.78* 3.81*  CALCIUM 8.3* 8.2* 8.6* 8.2*  MG 1.7  --   --   --   PHOS 3.2 4.0 5.2* 4.6    Liver Function Tests: Recent Labs  Lab 10/30/22 0911 11/01/22 0526 11/03/22 0947  ALBUMIN 2.2* 2.7* 2.5*   No results for input(s): "LIPASE", "AMYLASE" in the last 168 hours.  No results for input(s): "AMMONIA" in the last 168 hours.  CBC: Recent Labs  Lab 10/30/22 0615 10/31/22 0523 11/01/22 0853 11/02/22 0625 11/03/22 0950  WBC 12.1* 13.5* 10.9* 9.2 9.4  NEUTROABS  --  9.1*  --   --   --   HGB 8.2* 8.1* 7.7* 8.5* 8.0*  HCT 24.0* 23.2* 23.5* 24.9*  24.1*  MCV 89.6 89.9 92.9 91.2 92.7  PLT 122* 139* 177 204 240    Cardiac Enzymes: Recent Labs  Lab 10/29/22 0635  CKTOTAL 373    BNP: Invalid input(s): "POCBNP"  CBG: No results for input(s): "GLUCAP" in the last 168 hours.   Microbiology: Results for orders placed or performed during the hospital encounter of 10/16/22  Blood Culture (routine x 2)     Status: None   Collection Time: 10/16/22 11:44 PM   Specimen: BLOOD LEFT ARM  Result Value Ref Range Status   Specimen Description   Final    BLOOD LEFT ARM Performed at [facility], [address] [place], [place] [address]    Special Requests   Final    BOTTLES DRAWN AEROBIC AND ANAEROBIC Blood Culture adequate volume Performed at [facility], [address] [place], [place] [address]    Culture  Setup Time   Final    GRAM POSTIVE ORGANISM AEROBIC BOTTLE ONLY CRITICAL VALUE NOTED.  VALUE IS CONSISTENT WITH PREVIOUSLY REPORTED AND CALLED VALUE.    Culture   Final    GRAM POSITIVE RODS SENT TO [facility] FOR ID SEE SEPARATE REPORT Performed at [facility], [address], [place], [place] [address]    Report Status 10/31/2022 FINAL  Final  Blood Culture (routine x 2)     Status: Abnormal   Collection Time: 10/16/22 11:44 PM   Specimen: BLOOD RIGHT ARM  Result Value Ref Range Status   Specimen Description   Final    BLOOD RIGHT ARM Performed at [facility], [address] [place], [place] [address]    Special Requests   Final    BOTTLES DRAWN AEROBIC AND ANAEROBIC Blood Culture adequate volume Performed at [facility], [address], [place], [place] [address]    Culture  Setup Time   Final    GRAM POSITIVE COCCI ANAEROBIC BOTTLE ONLY CRITICAL RESULT CALLED TO, READ BACK BY AND VERIFIED WITH: NATHAN BELUE @ 2253 10/17/22 [facility] GRAM POSITIVE RODS AEROBIC BOTTLE ONLY CRITICAL RESULT CALLED TO, READ BACK BY AND VERIFIED WITH: JASON ROBBINS@0310  10/19/22 [facility]     Culture (A)  Final    STAPHYLOCOCCUS EPIDERMIDIS THE SIGNIFICANCE OF ISOLATING THIS ORGANISM FROM A SINGLE SET OF BLOOD CULTURES WHEN MULTIPLE SETS ARE DRAWN IS UNCERTAIN. PLEASE NOTIFY THE MICROBIOLOGY DEPARTMENT WITHIN ONE WEEK IF SPECIATION AND SENSITIVITIES ARE REQUIRED. GRAM POSITIVE RODS SENT TO [facility] FOR ID AND SUSCEPTIBILITIES SEE SEPARATE REPORT Performed at [facility], [address] [place], [place] [address]    Report Status 10/31/2022 FINAL  Final  SARS Coronavirus 2 by RT PCR (hospital order, performed in [facility] hospital lab) *cepheid single result test* Anterior Nasal Swab     Status: None   Collection Time: 10/16/22 11:44 PM   Specimen: Anterior Nasal Swab  Result Value Ref Range Status   SARS Coronavirus 2 by RT PCR NEGATIVE NEGATIVE Final    Comment: (NOTE) SARS-CoV-2 target nucleic acids are NOT DETECTED.  The SARS-CoV-2 RNA is generally detectable in upper and lower respiratory specimens during the acute phase of infection. The lowest concentration of SARS-CoV-2 viral copies this assay can detect is 250 copies / mL. A negative result does not preclude SARS-CoV-2 infection and should not be used as the sole basis for treatment or other patient management decisions.  A negative result may occur with improper specimen collection / handling, submission of specimen other than nasopharyngeal swab, presence of viral mutation(s) within the areas targeted by this assay, and inadequate number of viral copies (<250 copies / mL). A negative result must be combined with clinical observations, patient history, and epidemiological information.  Fact Sheet for Patients:   RoadLapTop.co.za  Fact Sheet for Healthcare Providers: http://kim-miller.com/  This test is not yet approved or  cleared by the [place] FDA and has been authorized for detection and/or diagnosis of SARS-CoV-2 by FDA under an Emergency Use  Authorization (EUA).  This EUA will remain in effect (meaning this test can be used) for the duration of the COVID-19 declaration under Section 564(b)(1) of the Act, 21 U.S.C. section 360bbb-3(b)(1), unless the authorization is terminated or revoked sooner.  Performed at [facility], [address] [place], [place] [address]   Blood Culture ID Panel (Reflexed)     Status: Abnormal   Collection Time: 10/16/22 11:44 PM  Result Value Ref Range Status   Enterococcus faecalis NOT DETECTED NOT DETECTED Final   Enterococcus Faecium NOT DETECTED NOT DETECTED Final   Listeria monocytogenes NOT DETECTED NOT DETECTED Final   Staphylococcus species DETECTED (A) NOT DETECTED Final    Comment: CRITICAL RESULT CALLED TO, READ BACK BY AND VERIFIED WITH: NATHAN BELUE @ 2253 10/17/22 [facility]    Staphylococcus aureus (BCID) NOT DETECTED NOT  DETECTED Final   Staphylococcus epidermidis DETECTED (A) NOT DETECTED Final    Comment: Methicillin (oxacillin) resistant coagulase negative staphylococcus. Possible blood culture contaminant (unless isolated from more than one blood culture draw or clinical case suggests pathogenicity). No antibiotic treatment is indicated for blood  culture contaminants. CRITICAL RESULT CALLED TO, READ BACK BY AND VERIFIED WITH: NATHAN BELUE @ 2253 10/17/22 [facility]    Staphylococcus lugdunensis NOT DETECTED NOT DETECTED Final   Streptococcus species NOT DETECTED NOT DETECTED Final   Streptococcus agalactiae NOT DETECTED NOT DETECTED Final   Streptococcus pneumoniae NOT DETECTED NOT DETECTED Final   Streptococcus pyogenes NOT DETECTED NOT DETECTED Final   A.calcoaceticus-baumannii NOT DETECTED NOT DETECTED Final   Bacteroides fragilis NOT DETECTED NOT DETECTED Final   Enterobacterales NOT DETECTED NOT DETECTED Final   Enterobacter cloacae complex NOT DETECTED NOT DETECTED Final   Escherichia coli NOT DETECTED NOT DETECTED Final   Klebsiella aerogenes NOT DETECTED NOT DETECTED  Final   Klebsiella oxytoca NOT DETECTED NOT DETECTED Final   Klebsiella pneumoniae NOT DETECTED NOT DETECTED Final   Proteus species NOT DETECTED NOT DETECTED Final   Salmonella species NOT DETECTED NOT DETECTED Final   Serratia marcescens NOT DETECTED NOT DETECTED Final   Haemophilus influenzae NOT DETECTED NOT DETECTED Final   Neisseria meningitidis NOT DETECTED NOT DETECTED Final   Pseudomonas aeruginosa NOT DETECTED NOT DETECTED Final   Stenotrophomonas maltophilia NOT DETECTED NOT DETECTED Final   Candida albicans NOT DETECTED NOT DETECTED Final   Candida auris NOT DETECTED NOT DETECTED Final   Candida glabrata NOT DETECTED NOT DETECTED Final   Candida krusei NOT DETECTED NOT DETECTED Final   Candida parapsilosis NOT DETECTED NOT DETECTED Final   Candida tropicalis NOT DETECTED NOT DETECTED Final   Cryptococcus neoformans/gattii NOT DETECTED NOT DETECTED Final   Methicillin resistance mecA/C DETECTED (A) NOT DETECTED Final    Comment: CRITICAL RESULT CALLED TO, READ BACK BY AND VERIFIED WITHDawayne Cirri @ 2253 10/17/22 [facility] Performed at [facility], [address] [place], [place] [address]   Organism ID, Bacteria     Status: None (Preliminary result)   Collection Time: 10/16/22 11:44 PM  Result Value Ref Range Status   Organism ID, Bacteria REFERT  Final    Comment: (NOTE) Please refer to the following specimen for additional lab results. Please refer to spec [phone] for test result. Lisbeth Renshaw notified 11-01-2022 at 09:55 AM. Performed At: [facility] [address] [place], [place] [id number] Jolene Schimke MD ON:[id number]    Source of Sample PENDING  Incomplete  Bacterial organism reflex     Status: None   Collection Time: 10/16/22 11:44 PM  Result Value Ref Range Status   Bacterial result 1 Comment  Final    Comment: (NOTE) Specimen has been received and testing has been initiated. Performed At: [facility] [address]  [place], [place] [id number] Jolene Schimke MD DG:[id number]   Organism ID by MALDI     Status: None (Preliminary result)   Collection Time: 10/16/22 11:44 PM  Result Value Ref Range Status   Organism ID by MALDI Comment  Final    Comment: (NOTE) Specimen has been received. Testing has been initiated. Performed At: [facility] [address] [place], [place] [id number] Jolene Schimke MD PP:[id number]    Source (MTB RIF) PENDING  Incomplete  Organism ID by MALDI     Status: None (Preliminary result)   Collection Time: 10/16/22 11:44 PM  Result Value Ref Range Status  Organism ID by MALDI Comment  Final    Comment: (NOTE) Specimen has been received. Testing has been initiated. Performed At: [facility] [address] [place], [place] [id number] Jolene Schimke MD OZ:[id number]    Source (MTB RIF) PENDING  Incomplete  Respiratory (~20 pathogens) panel by PCR     Status: None   Collection Time: 10/17/22  9:26 AM   Specimen: Nasopharyngeal Swab; Respiratory  Result Value Ref Range Status   Adenovirus NOT DETECTED NOT DETECTED Final   Coronavirus 229E NOT DETECTED NOT DETECTED Final    Comment: (NOTE) The Coronavirus on the Respiratory Panel, DOES NOT test for the novel  Coronavirus (2019 nCoV)    Coronavirus HKU1 NOT DETECTED NOT DETECTED Final   Coronavirus NL63 NOT DETECTED NOT DETECTED Final   Coronavirus OC43 NOT DETECTED NOT DETECTED Final   Metapneumovirus NOT DETECTED NOT DETECTED Final   Rhinovirus / Enterovirus NOT DETECTED NOT DETECTED Final   Influenza A NOT DETECTED NOT DETECTED Final   Influenza B NOT DETECTED NOT DETECTED Final   Parainfluenza Virus 1 NOT DETECTED NOT DETECTED Final   Parainfluenza Virus 2 NOT DETECTED NOT DETECTED Final   Parainfluenza Virus 3 NOT DETECTED NOT DETECTED Final   Parainfluenza Virus 4 NOT DETECTED NOT DETECTED Final   Respiratory Syncytial Virus NOT DETECTED NOT DETECTED Final   Bordetella pertussis NOT DETECTED  NOT DETECTED Final   Bordetella Parapertussis NOT DETECTED NOT DETECTED Final   Chlamydophila pneumoniae NOT DETECTED NOT DETECTED Final   Mycoplasma pneumoniae NOT DETECTED NOT DETECTED Final    Comment: Performed at [facility], [address], [place], [place] [address]  MRSA Next Gen by PCR, Nasal     Status: None   Collection Time: 10/18/22 10:53 AM   Specimen: Nasal Mucosa; Nasal Swab  Result Value Ref Range Status   MRSA by PCR Next Gen NOT DETECTED NOT DETECTED Final    Comment: (NOTE) The GeneXpert MRSA Assay (FDA approved for NASAL specimens only), is one component of a comprehensive MRSA colonization surveillance program. It is not intended to diagnose MRSA infection nor to guide or monitor treatment for MRSA infections. Test performance is not FDA approved in patients less than 2 years old. Performed at [facility], [address] [place], [place] [address]     Coagulation Studies: No results for input(s): "LABPROT", "INR" in the last 72 hours.   Urinalysis: No results for input(s): "COLORURINE", "LABSPEC", "PHURINE", "GLUCOSEU", "HGBUR", "BILIRUBINUR", "KETONESUR", "PROTEINUR", "UROBILINOGEN", "NITRITE", "LEUKOCYTESUR" in the last 72 hours.  Invalid input(s): "APPERANCEUR"    Imaging: No results found.   Medications:    anticoagulant sodium citrate      atorvastatin  40 mg Oral QHS   benztropine  2 mg Oral BID   busPIRone  15 mg Oral BID   Chlorhexidine Gluconate Cloth  6 each Topical Daily   cloZAPine  25 mg Oral BID   divalproex  1,000 mg Oral QHS   doxazosin  4 mg Oral Daily   epoetin (EPOGEN/PROCRIT) injection  10,000 Units Intravenous Q M,W,F-HD   feeding supplement (NEPRO CARB STEADY)  237 mL Oral TID BM   lactulose  30 g Oral QHS   levothyroxine  75 mcg Oral Q0600   lidocaine  10 mL Intradermal Once   multivitamin  1 tablet Oral QHS   nicotine  21 mg Transdermal Daily   mouth rinse  15 mL Mouth Rinse 4 times per day    pantoprazole  40 mg  Oral Daily   traZODone  50 mg Oral QHS   valbenazine  80 mg Oral Daily   acetaminophen **OR** acetaminophen, albuterol, alteplase, alum & mag hydroxide-simeth, anticoagulant sodium citrate, bismuth subsalicylate, guaiFENesin, heparin, lidocaine (PF), lidocaine-prilocaine, loperamide, LORazepam, magnesium hydroxide, magnesium hydroxide, meclizine, methocarbamol, morphine injection, ondansetron **OR** [DISCONTINUED] ondansetron (ZOFRAN) IV, mouth rinse, oxyCODONE-acetaminophen, pentafluoroprop-tetrafluoroeth, polyethylene glycol, sodium chloride  Assessment/ Plan:  Mr. Haywood Meinders is a 67 y.o.  male with past medical history of hypothyroidism, depression with anxiety, schizoaffective, and COPD, who was admitted to [facility]/16/2022 for Lactic acidosis [E87.20] Elevated troponin level [R79.89] Demand ischemia [I24.89] NSTEMI (non-ST elevated myocardial infarction) (HCC) [I21.4] Acute kidney injury (HCC) [N17.9] Septic shock (HCC) [A41.9, R65.21] Sepsis due to undetermined organism (HCC) [A41.9] Altered mental status, unspecified altered mental status type [R41.82] SIRS (systemic inflammatory response syndrome) (HCC) [R65.10]   Acute kidney injury secondary to rhabdomyolysis.  Baseline creatinine of 1.17, GFR >60 on 06/04/2022. Requiring hemodialysis.   - Next treatment scheduled for Friday  - Renal navigator confirmed outpatient dialysis clinic in [place] at [facility] on MWF schedule.   Lab Results  Component Value Date   CREATININE 3.81 (H) 11/03/2022   CREATININE 4.78 (H) 11/01/2022   CREATININE 5.34 (H) 10/30/2022    Intake/Output Summary (Last 24 hours) at 11/04/2022 1305 Last data filed at 11/03/2022 2025 Gross per 24 hour  Intake 430 ml  Output 200 ml  Net 230 ml   2. Anemia of acute blood loss:  Lab Results  Component Value Date   HGB 8.0 (L) 11/03/2022  - Hgb below target -Continue EPO with HD treatments.   3. Hypertension:  Regimen currently of  doxazosin.  - Blood pressure 147/77, stable for this patient   4. Hyponatremia: resolved       LOS: 18   7/18/20241:05 PM

## 2022-11-08 LAB — BPAM RBC
Blood Product Expiration Date: 202408142359
Blood Product Expiration Date: 202408142359
ISSUE DATE / TIME: 202407121101
ISSUE DATE / TIME: 202407121317
Unit Type and Rh: 5100
Unit Type and Rh: 5100

## 2022-11-08 LAB — TYPE AND SCREEN
ABO/RH(D): B POS
Antibody Screen: NEGATIVE
Unit division: 0
Unit division: 0

## 2022-11-10 ENCOUNTER — Inpatient Hospital Stay (HOSPITAL_COMMUNITY)
Admission: EM | Admit: 2022-11-10 | Discharge: 2022-11-16 | DRG: 871 | Disposition: A | Discharge status: Other Acute Inpatient Hospital | Payer: Medicare Other | Source: Ambulatory Visit | Attending: Pulmonary Disease | Admitting: Pulmonary Disease

## 2022-11-10 ENCOUNTER — Emergency Department (HOSPITAL_COMMUNITY): Payer: Medicare Other

## 2022-11-10 ENCOUNTER — Other Ambulatory Visit: Payer: Self-pay

## 2022-11-10 ENCOUNTER — Encounter (HOSPITAL_COMMUNITY): Payer: Self-pay | Admitting: Emergency Medicine

## 2022-11-10 DIAGNOSIS — M7989 Other specified soft tissue disorders: Secondary | ICD-10-CM | POA: Diagnosis not present

## 2022-11-10 DIAGNOSIS — E039 Hypothyroidism, unspecified: Secondary | ICD-10-CM | POA: Diagnosis present

## 2022-11-10 DIAGNOSIS — Z79890 Hormone replacement therapy (postmenopausal): Secondary | ICD-10-CM | POA: Diagnosis not present

## 2022-11-10 DIAGNOSIS — J440 Chronic obstructive pulmonary disease with acute lower respiratory infection: Secondary | ICD-10-CM | POA: Diagnosis present

## 2022-11-10 DIAGNOSIS — R339 Retention of urine, unspecified: Secondary | ICD-10-CM | POA: Diagnosis present

## 2022-11-10 DIAGNOSIS — N179 Acute kidney failure, unspecified: Secondary | ICD-10-CM | POA: Diagnosis not present

## 2022-11-10 DIAGNOSIS — Z789 Other specified health status: Secondary | ICD-10-CM

## 2022-11-10 DIAGNOSIS — E440 Moderate protein-calorie malnutrition: Secondary | ICD-10-CM | POA: Insufficient documentation

## 2022-11-10 DIAGNOSIS — J122 Parainfluenza virus pneumonia: Secondary | ICD-10-CM | POA: Diagnosis present

## 2022-11-10 DIAGNOSIS — Z6829 Body mass index (BMI) 29.0-29.9, adult: Secondary | ICD-10-CM | POA: Diagnosis not present

## 2022-11-10 DIAGNOSIS — Z515 Encounter for palliative care: Secondary | ICD-10-CM | POA: Diagnosis not present

## 2022-11-10 DIAGNOSIS — N186 End stage renal disease: Secondary | ICD-10-CM | POA: Diagnosis present

## 2022-11-10 DIAGNOSIS — J1561 Pneumonia due to Acinetobacter baumannii: Secondary | ICD-10-CM | POA: Diagnosis not present

## 2022-11-10 DIAGNOSIS — F250 Schizoaffective disorder, bipolar type: Secondary | ICD-10-CM | POA: Diagnosis present

## 2022-11-10 DIAGNOSIS — R636 Underweight: Secondary | ICD-10-CM | POA: Diagnosis present

## 2022-11-10 DIAGNOSIS — F419 Anxiety disorder, unspecified: Secondary | ICD-10-CM | POA: Diagnosis present

## 2022-11-10 DIAGNOSIS — N401 Enlarged prostate with lower urinary tract symptoms: Secondary | ICD-10-CM | POA: Diagnosis present

## 2022-11-10 DIAGNOSIS — Z992 Dependence on renal dialysis: Secondary | ICD-10-CM

## 2022-11-10 DIAGNOSIS — R6521 Severe sepsis with septic shock: Secondary | ICD-10-CM | POA: Diagnosis present

## 2022-11-10 DIAGNOSIS — J188 Other pneumonia, unspecified organism: Secondary | ICD-10-CM

## 2022-11-10 DIAGNOSIS — A419 Sepsis, unspecified organism: Secondary | ICD-10-CM | POA: Diagnosis not present

## 2022-11-10 DIAGNOSIS — S1093XD Contusion of unspecified part of neck, subsequent encounter: Secondary | ICD-10-CM

## 2022-11-10 DIAGNOSIS — A4154 Sepsis due to Acinetobacter baumannii: Principal | ICD-10-CM | POA: Diagnosis present

## 2022-11-10 DIAGNOSIS — F17210 Nicotine dependence, cigarettes, uncomplicated: Secondary | ICD-10-CM | POA: Diagnosis present

## 2022-11-10 DIAGNOSIS — J9601 Acute respiratory failure with hypoxia: Secondary | ICD-10-CM | POA: Diagnosis not present

## 2022-11-10 DIAGNOSIS — Z888 Allergy status to other drugs, medicaments and biological substances status: Secondary | ICD-10-CM

## 2022-11-10 DIAGNOSIS — Z781 Physical restraint status: Secondary | ICD-10-CM | POA: Diagnosis not present

## 2022-11-10 DIAGNOSIS — Z7189 Other specified counseling: Secondary | ICD-10-CM | POA: Diagnosis not present

## 2022-11-10 DIAGNOSIS — I4891 Unspecified atrial fibrillation: Secondary | ICD-10-CM | POA: Diagnosis not present

## 2022-11-10 DIAGNOSIS — J810 Acute pulmonary edema: Secondary | ICD-10-CM | POA: Diagnosis not present

## 2022-11-10 DIAGNOSIS — R451 Restlessness and agitation: Secondary | ICD-10-CM | POA: Diagnosis not present

## 2022-11-10 DIAGNOSIS — M6282 Rhabdomyolysis: Secondary | ICD-10-CM | POA: Diagnosis present

## 2022-11-10 DIAGNOSIS — Z1152 Encounter for screening for COVID-19: Secondary | ICD-10-CM | POA: Diagnosis not present

## 2022-11-10 DIAGNOSIS — J189 Pneumonia, unspecified organism: Secondary | ICD-10-CM

## 2022-11-10 DIAGNOSIS — I480 Paroxysmal atrial fibrillation: Secondary | ICD-10-CM | POA: Diagnosis not present

## 2022-11-10 DIAGNOSIS — Z66 Do not resuscitate: Secondary | ICD-10-CM | POA: Diagnosis not present

## 2022-11-10 DIAGNOSIS — E8770 Fluid overload, unspecified: Secondary | ICD-10-CM | POA: Diagnosis not present

## 2022-11-10 DIAGNOSIS — E872 Acidosis, unspecified: Secondary | ICD-10-CM | POA: Diagnosis not present

## 2022-11-10 DIAGNOSIS — Z79899 Other long term (current) drug therapy: Secondary | ICD-10-CM

## 2022-11-10 DIAGNOSIS — J9690 Respiratory failure, unspecified, unspecified whether with hypoxia or hypercapnia: Secondary | ICD-10-CM | POA: Diagnosis not present

## 2022-11-10 DIAGNOSIS — D631 Anemia in chronic kidney disease: Secondary | ICD-10-CM | POA: Diagnosis present

## 2022-11-10 DIAGNOSIS — R7881 Bacteremia: Secondary | ICD-10-CM | POA: Diagnosis present

## 2022-11-10 DIAGNOSIS — G4733 Obstructive sleep apnea (adult) (pediatric): Secondary | ICD-10-CM | POA: Diagnosis present

## 2022-11-10 LAB — CBC WITH DIFFERENTIAL/PLATELET
Abs Immature Granulocytes: 0 10*3/uL (ref 0.00–0.07)
Basophils Absolute: 0 10*3/uL (ref 0.0–0.1)
Basophils Relative: 0 %
Eosinophils Absolute: 0 10*3/uL (ref 0.0–0.5)
Eosinophils Relative: 0 %
HCT: 25.8 % — ABNORMAL LOW (ref 39.0–52.0)
Hemoglobin: 8 g/dL — ABNORMAL LOW (ref 13.0–17.0)
Lymphocytes Relative: 19 %
Lymphs Abs: 0.8 10*3/uL (ref 0.7–4.0)
MCH: 30.7 pg (ref 26.0–34.0)
MCHC: 31 g/dL (ref 30.0–36.0)
MCV: 98.9 fL (ref 80.0–100.0)
Monocytes Absolute: 1.1 10*3/uL — ABNORMAL HIGH (ref 0.1–1.0)
Monocytes Relative: 27 %
Neutro Abs: 2.2 10*3/uL (ref 1.7–7.7)
Neutrophils Relative %: 54 %
Platelets: 255 10*3/uL (ref 150–400)
RBC: 2.61 MIL/uL — ABNORMAL LOW (ref 4.22–5.81)
RDW: 18.2 % — ABNORMAL HIGH (ref 11.5–15.5)
WBC: 4.1 10*3/uL (ref 4.0–10.5)
nRBC: 0 /100 WBC
nRBC: 0.5 % — ABNORMAL HIGH (ref 0.0–0.2)

## 2022-11-10 LAB — POCT I-STAT EG7
Acid-Base Excess: 1 mmol/L (ref 0.0–2.0)
Bicarbonate: 26.2 mmol/L (ref 20.0–28.0)
Calcium, Ion: 1.14 mmol/L — ABNORMAL LOW (ref 1.15–1.40)
HCT: 24 % — ABNORMAL LOW (ref 39.0–52.0)
Hemoglobin: 8.2 g/dL — ABNORMAL LOW (ref 13.0–17.0)
O2 Saturation: 44 %
Patient temperature: 99
Potassium: 3.9 mmol/L (ref 3.5–5.1)
Sodium: 138 mmol/L (ref 135–145)
TCO2: 28 mmol/L (ref 22–32)
pCO2, Ven: 44.8 mmHg (ref 44–60)
pH, Ven: 7.376 (ref 7.25–7.43)
pO2, Ven: 26 mmHg — CL (ref 32–45)

## 2022-11-10 LAB — COMPREHENSIVE METABOLIC PANEL
ALT: 19 U/L (ref 0–44)
AST: 31 U/L (ref 15–41)
Albumin: 2 g/dL — ABNORMAL LOW (ref 3.5–5.0)
Alkaline Phosphatase: 53 U/L (ref 38–126)
Anion gap: 18 — ABNORMAL HIGH (ref 5–15)
BUN: 35 mg/dL — ABNORMAL HIGH (ref 8–23)
CO2: 23 mmol/L (ref 22–32)
Calcium: 8.4 mg/dL — ABNORMAL LOW (ref 8.9–10.3)
Chloride: 100 mmol/L (ref 98–111)
Creatinine, Ser: 3.81 mg/dL — ABNORMAL HIGH (ref 0.61–1.24)
GFR, Estimated: 16 mL/min — ABNORMAL LOW (ref >60)
Glucose, Bld: 83 mg/dL (ref 70–99)
Potassium: 3.9 mmol/L (ref 3.5–5.1)
Sodium: 141 mmol/L (ref 135–145)
Total Bilirubin: 1.1 mg/dL (ref 0.3–1.2)
Total Protein: 5 g/dL — ABNORMAL LOW (ref 6.5–8.1)

## 2022-11-10 LAB — I-STAT CHEM 8, ED
BUN: 33 mg/dL — ABNORMAL HIGH (ref 8–23)
Calcium, Ion: 1.17 mmol/L (ref 1.15–1.40)
Chloride: 101 mmol/L (ref 98–111)
Creatinine, Ser: 3.9 mg/dL — ABNORMAL HIGH (ref 0.61–1.24)
Glucose, Bld: 78 mg/dL (ref 70–99)
HCT: 26 % — ABNORMAL LOW (ref 39.0–52.0)
Hemoglobin: 8.8 g/dL — ABNORMAL LOW (ref 13.0–17.0)
Potassium: 4 mmol/L (ref 3.5–5.1)
Sodium: 139 mmol/L (ref 135–145)
TCO2: 25 mmol/L (ref 22–32)

## 2022-11-10 LAB — CREATININE, SERUM
Creatinine, Ser: 3.84 mg/dL — ABNORMAL HIGH (ref 0.61–1.24)
GFR, Estimated: 16 mL/min — ABNORMAL LOW (ref >60)

## 2022-11-10 LAB — RESPIRATORY PANEL BY PCR

## 2022-11-10 LAB — PHOSPHORUS: Phosphorus: 5.1 mg/dL — ABNORMAL HIGH (ref 2.5–4.6)

## 2022-11-10 LAB — TROPONIN I (HIGH SENSITIVITY)
Troponin I (High Sensitivity): 173 ng/L (ref <18)
Troponin I (High Sensitivity): 174 ng/L (ref <18)

## 2022-11-10 LAB — CBC
HCT: 24.5 % — ABNORMAL LOW (ref 39.0–52.0)
Hemoglobin: 7.9 g/dL — ABNORMAL LOW (ref 13.0–17.0)
MCH: 32.1 pg (ref 26.0–34.0)
MCHC: 32.2 g/dL (ref 30.0–36.0)
MCV: 99.6 fL (ref 80.0–100.0)
Platelets: 214 10*3/uL (ref 150–400)
RBC: 2.46 MIL/uL — ABNORMAL LOW (ref 4.22–5.81)
RDW: 17.8 % — ABNORMAL HIGH (ref 11.5–15.5)
WBC: 2.3 10*3/uL — ABNORMAL LOW (ref 4.0–10.5)
nRBC: 0 % (ref 0.0–0.2)

## 2022-11-10 LAB — LACTIC ACID, PLASMA: Lactic Acid, Venous: 6 mmol/L (ref 0.5–1.9)

## 2022-11-10 LAB — GLUCOSE, CAPILLARY
Glucose-Capillary: 112 mg/dL — ABNORMAL HIGH (ref 70–99)
Glucose-Capillary: 62 mg/dL — ABNORMAL LOW (ref 70–99)
Glucose-Capillary: 68 mg/dL — ABNORMAL LOW (ref 70–99)
Glucose-Capillary: 72 mg/dL (ref 70–99)

## 2022-11-10 LAB — MRSA NEXT GEN BY PCR, NASAL: MRSA by PCR Next Gen: NOT DETECTED

## 2022-11-10 LAB — I-STAT CG4 LACTIC ACID, ED: Lactic Acid, Venous: 4.8 mmol/L (ref 0.5–1.9)

## 2022-11-10 LAB — PROCALCITONIN: Procalcitonin: 12.94 ng/mL

## 2022-11-10 LAB — SARS CORONAVIRUS 2 BY RT PCR: SARS Coronavirus 2 by RT PCR: NEGATIVE

## 2022-11-10 LAB — BRAIN NATRIURETIC PEPTIDE: B Natriuretic Peptide: 1071.1 pg/mL — ABNORMAL HIGH (ref 0.0–100.0)

## 2022-11-10 MED ORDER — DARBEPOETIN ALFA 60 MCG/0.3ML IJ SOSY
60.0000 ug | PREFILLED_SYRINGE | INTRAMUSCULAR | Status: DC
  Administered 2022-11-10: 60 ug via SUBCUTANEOUS
  Filled 2022-11-10 (×2): qty 0.3

## 2022-11-10 MED ORDER — POLYETHYLENE GLYCOL 3350 17 G PO PACK: 17.0000 g | PACK | Freq: Every day | ORAL | Status: DC | PRN

## 2022-11-10 MED ORDER — DOCUSATE SODIUM 50 MG/5ML PO LIQD
100.0000 mg | Freq: Two times a day (BID) | ORAL | Status: DC | PRN
  Administered 2022-11-14: 100 mg
  Filled 2022-11-10: qty 10

## 2022-11-10 MED ORDER — HEPARIN SODIUM (PORCINE) 5000 UNIT/ML IJ SOLN
5000.0000 [IU] | Freq: Three times a day (TID) | INTRAMUSCULAR | Status: DC
  Administered 2022-11-10: 5000 [IU] via SUBCUTANEOUS
  Filled 2022-11-10 (×2): qty 1

## 2022-11-10 MED ORDER — IPRATROPIUM-ALBUTEROL 0.5-2.5 (3) MG/3ML IN SOLN: 3.0000 mL | RESPIRATORY_TRACT | Status: DC | PRN

## 2022-11-10 MED ORDER — HEPARIN SODIUM (PORCINE) 1000 UNIT/ML DIALYSIS
2000.0000 [IU] | Freq: Once | INTRAMUSCULAR | Status: AC
  Administered 2022-11-10: 2000 [IU] via INTRAVENOUS_CENTRAL
  Filled 2022-11-10 (×2): qty 2

## 2022-11-10 MED ORDER — HEPARIN SODIUM (PORCINE) 5000 UNIT/ML IJ SOLN
5000.0000 [IU] | Freq: Three times a day (TID) | INTRAMUSCULAR | Status: DC
  Administered 2022-11-11 – 2022-11-16 (×18): 5000 [IU] via SUBCUTANEOUS
  Filled 2022-11-10 (×18): qty 1

## 2022-11-10 MED ORDER — NOREPINEPHRINE 4 MG/250ML-% IV SOLN
2.0000 ug/min | INTRAVENOUS | Status: DC
  Administered 2022-11-10: 2 ug/min via INTRAVENOUS
  Administered 2022-11-11: 8 ug/min via INTRAVENOUS
  Administered 2022-11-11 (×2): 10 ug/min via INTRAVENOUS
  Administered 2022-11-12: 5 ug/min via INTRAVENOUS
  Administered 2022-11-14: 7 ug/min via INTRAVENOUS
  Filled 2022-11-10 (×6): qty 250

## 2022-11-10 MED ORDER — SODIUM CHLORIDE 0.9 % IV SOLN: 1.0000 g | INTRAVENOUS | Status: DC

## 2022-11-10 MED ORDER — ALBUMIN HUMAN 25 % IV SOLN
25.0000 g | Freq: Once | INTRAVENOUS | Status: AC
  Administered 2022-11-10: 25 g via INTRAVENOUS
  Filled 2022-11-10: qty 100

## 2022-11-10 MED ORDER — DEXTROSE 50 % IV SOLN
12.5000 g | INTRAVENOUS | Status: AC
  Administered 2022-11-10: 12.5 g via INTRAVENOUS

## 2022-11-10 MED ORDER — DEXTROSE 50 % IV SOLN
INTRAVENOUS | Status: AC
  Filled 2022-11-10: qty 50

## 2022-11-10 MED ORDER — DEXTROSE 50 % IV SOLN
12.5000 g | INTRAVENOUS | Status: AC
  Administered 2022-11-11: 12.5 g via INTRAVENOUS

## 2022-11-10 MED ORDER — VANCOMYCIN HCL 2000 MG/400ML IV SOLN
2000.0000 mg | Freq: Once | INTRAVENOUS | Status: AC
  Administered 2022-11-10: 2000 mg via INTRAVENOUS
  Filled 2022-11-10: qty 400

## 2022-11-10 MED ORDER — HEPARIN SODIUM (PORCINE) 1000 UNIT/ML DIALYSIS
2000.0000 [IU] | INTRAMUSCULAR | Status: DC | PRN
  Filled 2022-11-10: qty 2

## 2022-11-10 MED ORDER — SODIUM CHLORIDE 0.9 % IV SOLN
250.0000 mL | INTRAVENOUS | Status: DC
  Administered 2022-11-11: 250 mL via INTRAVENOUS

## 2022-11-10 MED ORDER — VANCOMYCIN VARIABLE DOSE PER UNSTABLE RENAL FUNCTION (PHARMACIST DOSING): Status: DC

## 2022-11-10 MED ORDER — NICOTINE 14 MG/24HR TD PT24
14.0000 mg | MEDICATED_PATCH | Freq: Every day | TRANSDERMAL | Status: DC
  Administered 2022-11-10 – 2022-11-16 (×7): 14 mg via TRANSDERMAL
  Filled 2022-11-10 (×7): qty 1

## 2022-11-10 MED ORDER — CHLORHEXIDINE GLUCONATE CLOTH 2 % EX PADS
6.0000 | MEDICATED_PAD | Freq: Every day | CUTANEOUS | Status: DC
  Administered 2022-11-11: 6 via TOPICAL

## 2022-11-10 MED ORDER — SODIUM CHLORIDE 0.9 % IV SOLN
2.0000 g | Freq: Once | INTRAVENOUS | Status: AC
  Administered 2022-11-10: 2 g via INTRAVENOUS
  Filled 2022-11-10: qty 12.5

## 2022-11-10 NOTE — Progress Notes (Signed)
ED Pharmacy Antibiotic Sign Off An antibiotic consult was received from an ED provider for cefepime and vancomycin per pharmacy dosing for pneumonia and sepsis. A chart review was completed to assess appropriateness.  The following one time order(s) were placed per pharmacy consult:  vancomycin 2000 mg x 1 dose cefepime 2000 mg x 1 dose  Further antibiotic and/or antibiotic pharmacy consults should be ordered by the admitting provider if indicated.   Thank you for allowing pharmacy to be a part of this patient's care.   Delmar Landau, PharmD, BCPS 11/10/2022 1:44 PM ED Clinical Pharmacist -  [phone]

## 2022-11-10 NOTE — Progress Notes (Addendum)
eLink Physician-Brief Progress Note Patient Name: Austin Marsh DOB: 1955/08/28 MRN: [id number]   Date of Service  11/10/2022  HPI/Events of Note  67 year old who presented with acute respiratory failure with hypoxemia and severe pneumonia and pulmonary edema.  Pathogen panel positive for parainfluenza.  eICU Interventions  Initiate droplet precautions.   2126 -having some increased hypotension towards the end of his dialysis run.  Will add on albumin 25% bolus.  Will draw a VBG off dialysis line.   Intervention Category Minor Interventions: Routine modifications to care plan (e.g. PRN medications for pain, fever)  Jasie Meleski 11/10/2022, 8:41 PM

## 2022-11-10 NOTE — Consult Note (Signed)
Renal Service Consult Note [facility]  Cabell Lazenby 11/10/2022 Maree Krabbe, MD Requesting Physician: Dr. Delton Coombes  Reason for Consult: Dialysis patient w/ resp distress HPI: The patient is a 67 y.o. year-old w/ PMH as below who presented to ED this afternoon coming from OP dialysis for SOB and hypoxia. Initial sats were in the 70s, requried NRB mask en route. Got albuterol nebs, BP 106/60 per ems. In ED pt has serious resp issues. CXR showed L > R pulm opacities concerning for multifocal PNA , less likely fluid. Pt was given IV abx and bipap in the ED.  Pt was seen by CCM.  Pt is a DNR and they do not want intubation either; pressors are okay though. CCM is admitting to ICU. We are asked to see for dialysis.    Pt seen in ED room. He looks uncomfortable, heavy breathing and coughing, I cannot understand what he is saying. Denies chest pain or abd pain.   Per chart pt was admitted at [facility] 6/29 to 11/04/2022 for AKI due to severe rhabdomyolysis. B/l creat was 1.17 in feb 2024. On admit 6/29 creat was 1.7, but continued to worsen w/ associated acute rhabdo w/ CPK peaking at > 50,000. Renal function deteriorated w/ creat up to 9.0 and he was started on iHD w/ first session on 10/23/22. He had another 5 sessions inpatient and w/o signs of recovery he was dc'd to outpatient HD on 7/18 about 1 week ago. His OP unit is [facility] in [place] on MWF schedule.       ROS - n/a, pt confused   Past Medical History  Past Medical History:  Diagnosis Date   COPD (chronic obstructive pulmonary disease) (HCC)    Schizoaffective disorder, bipolar type (HCC)    Sleep apnea    Tardive dyskinesia    Thyroid disease    Past Surgical History  Past Surgical History:  Procedure Laterality Date   APPENDECTOMY     DIALYSIS/PERMA CATHETER INSERTION N/A 10/27/2022   Procedure: DIALYSIS/PERMA CATHETER INSERTION;  Surgeon: Annice Needy, MD;  Location: [facility] INVASIVE CV LAB;  Service: Cardiovascular;   Laterality: N/A;   Family History History reviewed. No pertinent family history. Social History  reports that he has been smoking cigarettes. He has never used smokeless tobacco. He reports that he does not currently use alcohol. He reports that he does not currently use drugs. Allergies  Allergies  Allergen Reactions   Haldol [Haloperidol Lactate] Other (See Comments)    Tardive diskonesia   Other     [employer] cause patient to vomit   Thorazine [Chlorpromazine] Other (See Comments)    "messes me all up"   Trazodone And Nefazodone Other (See Comments)    "makes me feel like I'm dying."   Home medications Prior to Admission medications   Medication Sig Start Date End Date Taking? Authorizing Provider  acetaminophen (TYLENOL) 325 MG tablet Take 650 mg by mouth every 4 (four) hours as needed.    [provider]  albuterol (VENTOLIN HFA) 108 (90 Base) MCG/ACT inhaler Inhale 2 puffs into the lungs every 4 (four) hours as needed for wheezing or shortness of breath. 01/23/20   Shaune Pollack, MD  alum & mag hydroxide-simeth (MAALOX/MYLANTA) 200-200-20 MG/5ML suspension Take 30 mLs by mouth daily as needed for indigestion or heartburn.    [provider]  benztropine (COGENTIN) 2 MG tablet Take 2 mg by mouth 2 (two) times daily.    [provider]  bismuth subsalicylate (PEPTO BISMOL) 262 MG/15ML suspension Take 15 mLs by mouth every 4 (four) hours as needed.    [provider]  busPIRone (BUSPAR) 15 MG tablet Take 15 mg by mouth 2 (two) times daily.    [provider]  cloZAPine (CLOZARIL) 100 MG tablet Take 100-200 mg by mouth 2 (two) times daily. 100 mg qam 200 mg qpm    [provider]  divalproex (DEPAKOTE ER) 500 MG 24 hr tablet Take 1,000 mg by mouth at bedtime.    [provider]  doxazosin (CARDURA) 4 MG tablet Take 4 mg by mouth daily.    [provider]  epoetin alfa (EPOGEN) 10000 UNIT/ML injection Inject 1 mL  (10,000 Units total) into the vein every Monday, Wednesday, and Friday with hemodialysis. 11/05/22   Darlin Priestly, MD  guaiFENesin (ROBITUSSIN) 100 MG/5ML liquid Take 200 mg by mouth every 6 (six) hours as needed for cough.    [provider]  levothyroxine (SYNTHROID) 100 MCG tablet Take 75 mcg by mouth daily before breakfast.    [provider]  loperamide (IMODIUM) 2 MG capsule Take 2 mg by mouth as needed for diarrhea or loose stools.    [provider]  magnesium hydroxide (MILK OF MAGNESIA) 400 MG/5ML suspension Take 30 mLs by mouth daily as needed for mild constipation.    [provider]  meclizine (ANTIVERT) 25 MG tablet Take 25 mg by mouth 3 (three) times daily as needed for dizziness.    [provider]  multivitamin (RENA-VIT) TABS tablet Take 1 tablet by mouth at bedtime. 11/04/22   Darlin Priestly, MD  nicotine (NICODERM CQ - DOSED IN MG/24 HOURS) 21 mg/24hr patch Place 1 patch (21 mg total) onto the skin daily. 11/05/22   Darlin Priestly, MD  Nutritional Supplements (FEEDING SUPPLEMENT, NEPRO CARB STEADY,) LIQD Take 237 mLs by mouth 3 (three) times daily between meals. 11/04/22   Darlin Priestly, MD  pantoprazole (PROTONIX) 40 MG tablet Take 40 mg by mouth daily.    [provider]  sennosides-docusate sodium (SENOKOT-S) 8.6-50 MG tablet Take 1 tablet by mouth in the morning and at bedtime.    [provider]  traZODone (DESYREL) 50 MG tablet Take 50 mg by mouth at bedtime.    [provider]  valbenazine (INGREZZA) 80 MG capsule Take 80 mg by mouth daily.    [provider]  vitamin B-12 (CYANOCOBALAMIN) 100 MCG tablet Take 100 mcg by mouth daily.    [provider]  Vitamin D, Cholecalciferol, 25 MCG (1000 UT) TABS Take 50 mcg by mouth daily.     [provider]     Vitals:   11/10/22 1430 11/10/22 1445 11/10/22 1500 11/10/22 1515  BP: 100/74 (!) 112/58 94/83 92/69   Pulse: (!) 101 (!) 102 97 98  Resp: (!)  33 (!) 38 (!) 35 (!) 35  Temp:      TempSrc:      SpO2: 100% 100% 100% 100%  Weight:      Height:       Exam Gen disheveled, ^wob, NRB mask in place, awake but lethargic No rash, cyanosis or gangrene Sclera anicteric, throat clear  +jvd 10-12 cm Chest poor air movement L chest in general, coarse rhonchi R base RRR no RG Abd soft ntnd no mass or ascites +bs GU normal male MS no joint effusions or deformity Ext diffuse 2-3+ pitting bilat LE edema from feet to hips Neuro is as above, NF  RIJ TDC     Home meds- tylenol, ventolin, maalox, pepto bismol, buspar bid, clozapine bid, depakote er, cardura 4mg  every day, robitussin, synthroid, imodium, MOM, antivert, renavite, nicotine patch, nepro supps, protonix, senokot-s, desyrel, ingrezza, b-12, vit D     [facility] HD[facility]  3.5h   400/300  97.7kg   2/2.25 bath  RIJ TDC  Heparin 2000 - last and only OP HD was on 7/22, post wt 98.4kg - venofer 100mg  IV q HD thru 8/16 - no esa or vdra   Assessment/ Plan: Acute hypoxic resp failure - w/ severe pna -like opacities in the L lung > R. Also has severe anasarca of LE's. No sure if there is an element of pulm edema. Have d/w CCM, will plan HD tonight in ICU (w/ pressor support as needed given low BP's in ED). Poor prognosis overall and pt is DNR/ DNI.   Severe AKI (acute rhabdo) - dialysis dependent since 10/23/22, started at [facility]. Pt is not esrd yet. Dc'd 7/18 and had one OP HD 2 days ago.  HTN/ volume - marked LE edema, not sure pulm edema or not. Hypotensive.  Anemia esrd - Hb 8- 9 here, getting IV Fe in OP setting but will hold off due to infection. Will start esa w/ darbe 60 mcg sq weekly while pt is here.  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  Schizoaffective d/o - is on multiple psychiatric meds  Hypothyroidism   Vinson Moselle  MD [facility] 11/10/2022, 4:44 PM  Recent Labs  Lab 11/10/22 1336 11/10/22 1348  HGB 8.0* 8.8*  ALBUMIN 2.0*  --   CALCIUM 8.4*  --   CREATININE 3.81*  3.90*  K 3.9 4.0   Inpatient medications:  heparin  5,000 Units Subcutaneous Q8H   nicotine  14 mg Transdermal Daily    vancomycin 2,000 mg (11/10/22 1455)   docusate, ipratropium-albuterol, polyethylene glycol

## 2022-11-10 NOTE — ED Provider Notes (Signed)
[place] EMERGENCY DEPARTMENT AT [facility] Provider Note   CSN: [id number] Arrival date & time: 11/10/22  1323     History  Chief Complaint  Patient presents with   Shortness of Breath    Austin Marsh is a 67 y.o. male.  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    Dialysis called EMS when they found him to be hypoxic. He feels short of breath, cannot say how long.  Has had cough productive of yellow sputum, for day.    Reports he would not want intubation and would want to be DNR, asked several times with nurse witnesses to confirm decision.  He reports "I don't want to live like this" but is ok with other interventions/bipap.  Reports he had chest pain previously but no chest pain today. Does not know if he has had fevers.  History is limited by acuity of condition.   Found to have saturations of 70%, place on nonrebreather en route, albuterol, 100cc [place] by EMS.    Past Medical History:  Diagnosis Date   COPD (chronic obstructive pulmonary disease) (HCC)    Schizoaffective disorder, bipolar type (HCC)    Sleep apnea    Tardive dyskinesia    Thyroid disease      Home Medications Prior to Admission medications   Medication Sig Start Date End Date Taking? Authorizing Provider  acetaminophen (TYLENOL) 325 MG tablet Take 650 mg by mouth every 4 (four) hours as needed for moderate pain.   Yes [provider]  albuterol (VENTOLIN HFA) 108 (90 Base) MCG/ACT inhaler Inhale 2 puffs into the lungs every 4 (four) hours as  needed for wheezing or shortness of breath. 01/23/20  Yes Shaune Pollack, MD  alum & mag hydroxide-simeth (MAALOX/MYLANTA) 200-200-20 MG/5ML suspension Take 30 mLs by mouth daily as needed for indigestion or heartburn.   Yes [provider]  benztropine (COGENTIN) 2 MG tablet Take 2 mg by mouth 2 (two) times daily.   Yes [provider]  bismuth subsalicylate (PEPTO BISMOL) 262 MG/15ML suspension Take 15 mLs by mouth every 4 (four) hours as needed.   Yes [provider]  busPIRone (BUSPAR) 15 MG tablet Take 15 mg by mouth 2 (two) times daily.   Yes [provider]  cloZAPine (CLOZARIL) 100 MG tablet Take 100-200 mg by mouth See admin instructions. Take 100 mg by mouth in the morning and 200 mg in the evening. 200 mg qpm   Yes [provider]  divalproex (DEPAKOTE ER) 500 MG 24 hr tablet Take 1,000 mg by mouth at bedtime.   Yes [provider]  doxazosin (CARDURA) 4 MG tablet Take 4 mg by mouth daily.   Yes [provider]  guaiFENesin (ROBITUSSIN) 100 MG/5ML liquid Take 200 mg by mouth every 6 (six) hours as needed for cough.   Yes [provider]  levothyroxine (SYNTHROID) 75 MCG tablet Take 75 mcg by mouth daily before breakfast.   Yes [provider]  loperamide (IMODIUM) 2 MG capsule Take 2 mg by mouth every 4 (four) hours as needed for diarrhea or loose stools.  Yes [provider]  magnesium hydroxide (MILK OF MAGNESIA) 400 MG/5ML suspension Take 30 mLs by mouth daily as needed for mild constipation. 325 mg every 30 hours as needed for constipation   Yes [provider]  meclizine (ANTIVERT) 25 MG tablet Take 25 mg by mouth 3 (three) times daily as needed for dizziness.   Yes [provider]  multivitamin (RENA-VIT) TABS tablet Take 1 tablet by mouth at bedtime. 11/04/22  Yes Darlin Priestly, MD  nicotine (NICODERM CQ - DOSED IN MG/24 HOURS) 21 mg/24hr patch Place 1 patch (21 mg total) onto the  skin daily. 11/05/22  Yes Darlin Priestly, MD  Nutritional Supplements (FEEDING SUPPLEMENT, NEPRO CARB STEADY,) LIQD Take 237 mLs by mouth 3 (three) times daily between meals. 11/04/22  Yes Darlin Priestly, MD  pantoprazole (PROTONIX) 40 MG tablet Take 40 mg by mouth daily.   Yes [provider]  sennosides-docusate sodium (SENOKOT-S) 8.6-50 MG tablet Take 1 tablet by mouth in the morning and at bedtime.   Yes [provider]  valbenazine (INGREZZA) 80 MG capsule Take 80 mg by mouth daily.   Yes [provider]  vitamin B-12 (CYANOCOBALAMIN) 100 MCG tablet Take 100 mcg by mouth daily.   Yes [provider]  Vitamin D, Cholecalciferol, 25 MCG (1000 UT) TABS Take 50 mcg by mouth daily.    Yes [provider]  epoetin alfa (EPOGEN) 10000 UNIT/ML injection Inject 1 mL (10,000 Units total) into the vein every Monday, Wednesday, and Friday with hemodialysis. Patient not taking: Reported on 11/10/2022 11/05/22   Darlin Priestly, MD      Allergies    Haldol [haloperidol lactate], Other, Thorazine [chlorpromazine], and Trazodone and nefazodone    Review of Systems   Review of Systems  Physical Exam Updated Vital Signs BP 90/61 (BP Location: Left Arm)   Pulse 99   Temp 99 F (37.2 C) (Axillary)   Resp (!) 31   Ht 5\' 9"  (1.753 m)   Wt 101.2 kg   SpO2 93%   BMI 32.93 kg/m  Physical Exam Vitals and nursing note reviewed.  Constitutional:      General: He is in acute distress.     Appearance: He is well-developed. He is ill-appearing. He is not diaphoretic.  HENT:     Head: Normocephalic and atraumatic.  Eyes:     Conjunctiva/sclera: Conjunctivae normal.  Cardiovascular:     Rate and Rhythm: Normal rate and regular rhythm.     Heart sounds: Normal heart sounds. No murmur heard.    No friction rub. No gallop.  Pulmonary:     Effort: Tachypnea and respiratory distress present.     Breath sounds: Rhonchi and rales present.  Abdominal:     General: There is no  distension.     Palpations: Abdomen is soft.     Tenderness: There is no abdominal tenderness. There is no guarding.  Musculoskeletal:     Cervical back: Normal range of motion.     Right lower leg: Edema present.     Left lower leg: Edema present.  Skin:    General: Skin is warm and dry.  Neurological:     Mental Status: He is alert and oriented to person, place, and time.     Comments: Is oriented to hospital (initially thought he was brought to [facility] via EMS which was location of recent admission), self, situation     ED Results / Procedures / Treatments   Labs (all labs ordered are  listed, but only abnormal results are displayed) Labs Reviewed  RESPIRATORY PANEL BY PCR - Abnormal; Notable for the following components:      Result Value   Parainfluenza Virus 3 DETECTED (*)    All other components within normal limits  CBC WITH DIFFERENTIAL/PLATELET - Abnormal; Notable for the following components:   RBC 2.61 (*)    Hemoglobin 8.0 (*)    HCT 25.8 (*)    RDW 18.2 (*)    nRBC 0.5 (*)    Monocytes Absolute 1.1 (*)    All other components within normal limits  BRAIN NATRIURETIC PEPTIDE - Abnormal; Notable for the following components:   B Natriuretic Peptide 1,071.1 (*)    All other components within normal limits  COMPREHENSIVE METABOLIC PANEL - Abnormal; Notable for the following components:   BUN 35 (*)    Creatinine, Ser 3.81 (*)    Calcium 8.4 (*)    Total Protein 5.0 (*)    Albumin 2.0 (*)    GFR, Estimated 16 (*)    Anion gap 18 (*)    All other components within normal limits  CREATININE, SERUM - Abnormal; Notable for the following components:   Creatinine, Ser 3.84 (*)    GFR, Estimated 16 (*)    All other components within normal limits  LACTIC ACID, PLASMA - Abnormal; Notable for the following components:   Lactic Acid, Venous 6.0 (*)    All other components within normal limits  CBC - Abnormal; Notable for the following components:   WBC 2.3 (*)    RBC  2.46 (*)    Hemoglobin 7.9 (*)    HCT 24.5 (*)    RDW 17.8 (*)    All other components within normal limits  PHOSPHORUS - Abnormal; Notable for the following components:   Phosphorus 5.1 (*)    All other components within normal limits  GLUCOSE, CAPILLARY - Abnormal; Notable for the following components:   Glucose-Capillary 62 (*)    All other components within normal limits  GLUCOSE, CAPILLARY - Abnormal; Notable for the following components:   Glucose-Capillary 112 (*)    All other components within normal limits  I-STAT CG4 LACTIC ACID, ED - Abnormal; Notable for the following components:   Lactic Acid, Venous 4.8 (*)    All other components within normal limits  I-STAT CHEM 8, ED - Abnormal; Notable for the following components:   BUN 33 (*)    Creatinine, Ser 3.90 (*)    Hemoglobin 8.8 (*)    HCT 26.0 (*)    All other components within normal limits  POCT I-STAT EG7 - Abnormal; Notable for the following components:   pO2, Ven 26 (*)    Calcium, Ion 1.14 (*)    HCT 24.0 (*)    Hemoglobin 8.2 (*)    All other components within normal limits  TROPONIN I (HIGH SENSITIVITY) - Abnormal; Notable for the following components:   Troponin I (High Sensitivity) 173 (*)    All other components within normal limits  TROPONIN I (HIGH SENSITIVITY) - Abnormal; Notable for the following components:   Troponin I (High Sensitivity) 174 (*)    All other components within normal limits  SARS CORONAVIRUS 2 BY RT PCR  MRSA NEXT GEN BY PCR, NASAL  CULTURE, BLOOD (ROUTINE X 2)  CULTURE, BLOOD (ROUTINE X 2)  EXPECTORATED SPUTUM ASSESSMENT W GRAM STAIN, RFLX TO RESP C  PROCALCITONIN  GLUCOSE, CAPILLARY  LACTIC ACID, PLASMA  STREP PNEUMONIAE URINARY ANTIGEN  LEGIONELLA  PNEUMOPHILA SEROGP 1 UR AG  URINALYSIS, ROUTINE W REFLEX MICROSCOPIC  CBC  BASIC METABOLIC PANEL  MAGNESIUM  PHOSPHORUS  BLOOD GAS, VENOUS  I-STAT CG4 LACTIC ACID, ED    EKG EKG Interpretation Date/Time:  Wednesday November 10 2022 14:27:13 EDT Ventricular Rate:  100 PR Interval:    QRS Duration:  67 QT Interval:  338 QTC Calculation: 436 R Axis:   56  Text Interpretation: Atrial fibrillation Aberrant conduction of SV complex(es) Artifact limits evaluation Confirmed by Alvira Monday ([id number]) on 11/10/2022 2:32:51 PM  Radiology DG Chest Portable 1 View  Result Date: 11/10/2022 CLINICAL DATA:  resp distress EXAM: PORTABLE CHEST 1 VIEW COMPARISON:  10/18/2022. FINDINGS: There are new heterogeneous opacities overlying the left lung without significant volume loss, compatible with multilobar pneumonia. There also additional heterogeneous opacities overlying the right paracardiac region, partially obscuring the right heart border, which may represent middle lobe pneumonia. Bilateral lateral costophrenic angles are clear. Stable cardio-mediastinal silhouette. No acute osseous abnormalities. The soft tissues are within normal limits. Right-sided hemodialysis catheter seen with its tip overlying the cavoatrial junction region. IMPRESSION: *New heterogeneous opacities overlying the left lung and right paracardiac region, compatible with multilobar pneumonia. Follow-up to clearing is recommended. Electronically Signed   By: Jules Schick M.D.   On: 11/10/2022 14:48    Procedures .Critical Care  Performed by: Alvira Monday, MD Authorized by: Alvira Monday, MD   Critical care provider statement:    Critical care time (minutes):  30   Critical care was time spent personally by me on the following activities:  Development of treatment plan with patient or surrogate, discussions with consultants, evaluation of patient's response to treatment, examination of patient, ordering and review of laboratory studies, ordering and review of radiographic studies, ordering and performing treatments and interventions, pulse oximetry, re-evaluation of patient's condition and review of old charts     Medications Ordered in  ED Medications  docusate (COLACE) 50 MG/5ML liquid 100 mg (has no administration in time range)  polyethylene glycol (MIRALAX / GLYCOLAX) packet 17 g (has no administration in time range)  ipratropium-albuterol (DUONEB) 0.5-2.5 (3) MG/3ML nebulizer solution 3 mL (has no administration in time range)  nicotine (NICODERM CQ - dosed in mg/24 hours) patch 14 mg (14 mg Transdermal Patch Applied 11/10/22 1725)  0.9 %  sodium chloride infusion (has no administration in time range)  norepinephrine (LEVOPHED) 4mg  in (0.016 mg/mL) premix infusion (1 mcg/min Intravenous Infusion Verify 11/10/22 2000)  Darbepoetin Alfa (ARANESP) injection 60 mcg (60 mcg Subcutaneous Given 11/10/22 2030)  Chlorhexidine Gluconate Cloth 2 % PADS 6 each (has no administration in time range)  heparin injection 2,000 Units (has no administration in time range)  ceFEPIme (MAXIPIME) 1 g in sodium chloride 0.9 % 100 mL IVPB (has no administration in time range)  vancomycin variable dose per unstable renal function (pharmacist dosing) (has no administration in time range)  heparin injection 5,000 Units (has no administration in time range)  ceFEPIme (MAXIPIME) 2 g in sodium chloride 0.9 % 100 mL IVPB (0 g Intravenous Stopped 11/10/22 1455)  vancomycin (VANCOREADY) IVPB 2000 mg/400 mL (0 mg Intravenous Stopped 11/10/22 1654)  heparin injection 2,000 Units (2,000 Units Dialysis Given 11/10/22 2011)  dextrose 50 % solution 12.5 g (0 g Intravenous Duplicate 11/10/22 1750)  albumin human 25 % solution 25 g (25 g Intravenous New Bag/Given 11/10/22 2148)    ED Course/ Medical Decision Making/ A&P  67yo male with history of COPD, hypothyroidism, depression with anxiety, BPH, 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  Arrives to the ED in respiratory distress, rhonchorous breath sounds, coughing up yellow sputum. Discussed Code Status-he is oriented at this time to situation and feel he has capacity to make decisions, reports he would not want intubation or CPR, is ok with further medical interventions/dialysis/pressors.    Placed on BiPAP given work of breathing, not wanting intubation.   Blood pressures 90s systolic.  Blood cultures, vancomycin and cefepime ordered with concern for pneumonia.  CXR evalauted by me and radiology shows significant left sided opacities, given yellow psutum and these findings concern for pneumonia.   Labs completed and personally evaluated by me show troponin elevation less than prior, similar anemia, lactic acidosis elevated BNP, Cr similar to prior, no hyperkalemia.   Consulted PCCM for admission with concern for sepsis secondary to multifocal pneumonia, borderline hypotension in setting of him not desiring intubation/dialysis status not wanting to give large fluid resuscitation anc concern for need for dialysis with low blood pressures.  PCCM to bedside to evaluate and admit.         Final Clinical Impression(s) / ED Diagnoses Final diagnoses:  Acute hypoxic respiratory failure (HCC)  Multifocal pneumonia  Lactic acidosis    Rx / DC Orders ED Discharge Orders     None         Alvira Monday, MD 11/10/22 2312

## 2022-11-10 NOTE — ED Notes (Signed)
Pt states his medication needs to be crushed and given with applesauce.

## 2022-11-10 NOTE — ED Notes (Signed)
Pt declined calling family.

## 2022-11-10 NOTE — Progress Notes (Signed)
Pt more lethargic but does follow commands and attempts to state name. ELink, RN paged and cameraed in the room. Increasing levo gtt, HD will be stopped, and MD to assess patient.

## 2022-11-10 NOTE — Progress Notes (Signed)
Pharmacy Antibiotic Note  Austin Marsh is a 67 y.o. male for which pharmacy has been consulted for cefepime and vancomycin dosing for pneumonia.  Patient with a history of ESRD recently started on HD, COPD, schizoaffective w/ bipolar disorder, depression, hypothyroidism, tobacco abuse, OSA. Patient presenting with respiratory failure.  WBC 4.1; LA 4.8; T 97>98.4; HR 98; RR 35  Plan: Cefepime 2g given in ED will start Cefepime 1g q24hr dosed after HD on HD days Vancomycin 2000 mg once - repeat dosing per HD schedule ---HD note planning for HD tonight in ICU w/ pressor support Monitor WBC, fever, renal function, cultures De-escalate when able F/u Nephrology plan  Height: 5\' 9"  (175.3 cm) Weight: 101.2 kg (223 lb) IBW/kg (Calculated) : 70.7  Temp (24hrs), Avg:97 F (36.1 C), Min:97 F (36.1 C), Max:97 F (36.1 C)  Recent Labs  Lab 11/10/22 1336 11/10/22 1345 11/10/22 1348  WBC 4.1  --   --   CREATININE 3.81*  --  3.90*  LATICACIDVEN  --  4.8*  --     Estimated Creatinine Clearance: 21.3 mL/min (A) (by C-G formula based on SCr of 3.9 mg/dL (H)).    Allergies  Allergen Reactions   Haldol [Haloperidol Lactate] Other (See Comments)    Tardive diskonesia   Other     Navy Beans cause patient to vomit   Thorazine [Chlorpromazine] Other (See Comments)    "messes me all up"   Trazodone And Nefazodone Other (See Comments)    "makes me feel like I'm dying."   Microbiology results: Pending  Thank you for allowing pharmacy to be a part of this patient's care.  Delmar Landau, PharmD, BCPS 11/10/2022 4:07 PM ED Clinical Pharmacist -  [phone]

## 2022-11-10 NOTE — Progress Notes (Signed)
Pt transported from ED15 to 3M04 by RT and RN w/o complications

## 2022-11-10 NOTE — Progress Notes (Addendum)
eLink Physician-Brief Progress Note Patient Name: Austin Marsh DOB: Jun 15, 1955 MRN: [id number]   Date of Service  11/10/2022  HPI/Events of Note  67 year old who presented with acute respiratory failure with hypoxemia and severe pneumonia and pulmonary edema.  Pathogen panel positive for parainfluenza.  eICU Interventions  Initiate droplet precautions.   2126 -having some increased hypotension towards the end of his dialysis run.  Will add on albumin 25% bolus.  Will draw a VBG off dialysis line.  2143 - Low central venous Sat, pCO2 within normal limits.Increase EPAP to 10, IPAP 15.   2352 -lactic acid 6.0, up from 4.8.  Continue to trend.  Repeat VBG and lactic with morning labs.  Intervention Category Minor Interventions: Routine modifications to care plan (e.g. PRN medications for pain, fever)  Inari Shin 11/10/2022, 8:41 PM

## 2022-11-10 NOTE — ED Triage Notes (Signed)
Per [facility] pt coming from dialysis for shortness of breath and hypoxia. Initial sats in 70s and increased his [place] from 2L to 4L. Placed on non rebreather en route.  Given 5mg  albuterol and 100 CC NS.   CBG 96 106/60 97% on NRB  EDP and respiratory at bedside.

## 2022-11-10 NOTE — H&P (Signed)
NAME:  Austin Marsh, MRN:  [id number], DOB:  [birth year], LOS: 0 ADMISSION DATE:  11/10/2022, CONSULTATION DATE:  7/24 REFERRING MD:  Dr. Dalene Seltzer, CHIEF COMPLAINT:  hypoxic respiratory failure; hypotension   History of Present Illness:  Patient is a 67 yo M w/ pertinent Pmh COPD, ESRD recently started on HD, schizoaffective w/ bipolar disorder, depression/anxiety, hypothyroidism, tobacco abuse, OSA on cpap present to [facility] ED 7/24 w/ hypoxic respiratory failure.  Patient admitted to [facility] 6/30 for lethargy. Found to have acute urinary retention and severe AKI on CKD 3a requiring dialysis. During hospital course, patient had hypovolemic shock and anemia from left neck hematoma. BP improved w/ PRBCs and fluids. Started on midodrine. Patient had RLL PNA and completed course of abx. Had rhabdo which improved w/ fluid resuscitation. Discharged on 7/18.   On 7/24 patient after dialysis having sob and hypoxia. Sats in 49s. Increased [place] to 4L. EMS called and placed on NRB and transferred to [facility] ED. On ED arrival sats 97% on NRB. BP 106/60. Patient placed on bipap. RR 30s. Afebrile. BNP 1,071, LA 4.8,  and trop 173. CXR w/ opacities b/l concerning for mulilobar pneumonia. Cultures obtained and started on cefepime/vanc. Covid/flu pending. PCCM consulted.   Pertinent ED Labs: wbc, 4.1, hgb 8, AG 18,   Pertinent  Medical History   COPD ESRD recently started on HD schizoaffective w/ bipolar disorder depression/anxiety Hypothyroidism tobacco abuse OSA on cpap  Significant Hospital Events: Including procedures, antibiotic start and stop dates in addition to other pertinent events   7/24 admitted to [facility] w/ resp failure w/ hypoxia on bipap  Interim History / Subjective:  Seen in ER, increased WOB. Poor compliance with bipap, placed back on NRB.    Objective   Blood pressure 92/69, pulse 98, temperature (!) 97 F (36.1 C), temperature source Tympanic, resp. rate (!) 35, height 5\' 9"  (1.753 m),  weight 101.2 kg, SpO2 100%.    FiO2 (%):  [100 %] 100 %  No intake or output data in the 24 hours ending 11/10/22 1559 Filed Weights   11/10/22 1334  Weight: 101.2 kg    Examination: General: chronically ill appearing male, acute respiratory distress  HENT: mm dry, no JVD  Lungs: resps labored on bipap, dyssynchronous, placed back on NRB, tachypneic, coarse throughout Cardiovascular: s1s2 rrr Abdomen: soft, non tender  Extremities: warm and dry, 2-3+ pitting edema  Neuro: awake, unclear orientation, answers some questions appropriately, mumbling at times, MAE   Resolved Hospital Problem list     Assessment & Plan:  Acute respiratory failure w/ hypoxia Multifocal pneumonia +/- Pulmonary edema COPD OSA on cpap Plan: Respiratory status tenuous. Note DNR/DNI  -consider retry bipap if increased WOB, holding for now  -NRB -titrate O2 as able to keep sats >92% -cefepime/vanc for HAP ppx -mrsa pcr, pct, urine legionella/strep -expectorated sputum if able -pulm toiletry -guafenesin -HD for volume removal -covid, RVP pending   Hypotension Plan:  BP marginal but MAP ok for now - consider low dose peripheral pressors if trends down   New diagnosis of ESRD on HD  Plan: -nephro consult for HD, suspect needs volume removal ASAP  -may need low dose peripheral pressors to allow HD  -Trend BMP / urinary output -Replace electrolytes as indicated -Avoid nephrotoxic agents, ensure adequate renal perfusion  Schizoaffective disorder, bipolar type Depression with anxiety Plan: -holding home meds   Hypothyroidism Plan: -resume synthroid when able to take PO   Tobacco abuse Plan: -nicotine patch  BPH Plan: -doxazosin  when able to take po  Will attempt to reach family for further discussion/clarification goals of care. Note DNR/DNI.   Best Practice (right click and "Reselect all SmartList Selections" daily)   Diet/type: NPO DVT prophylaxis: prophylactic heparin  GI  prophylaxis: PPI Lines: N/A Foley:  N/A Code Status:  DNR Last date of multidisciplinary goals of care discussion [7/24]  Labs   CBC: Recent Labs  Lab 11/10/22 1336 11/10/22 1348  WBC 4.1  --   NEUTROABS 2.2  --   HGB 8.0* 8.8*  HCT 25.8* 26.0*  MCV 98.9  --   PLT 255  --     Basic Metabolic Panel: Recent Labs  Lab 11/10/22 1336 11/10/22 1348  NA 141 139  K 3.9 4.0  CL 100 101  CO2 23  --   GLUCOSE 83 78  BUN 35* 33*  CREATININE 3.81* 3.90*  CALCIUM 8.4*  --    GFR: Estimated Creatinine Clearance: 21.3 mL/min (A) (by C-G formula based on SCr of 3.9 mg/dL (H)). Recent Labs  Lab 11/10/22 1336 11/10/22 1345  WBC 4.1  --   LATICACIDVEN  --  4.8*    Liver Function Tests: Recent Labs  Lab 11/10/22 1336  AST 31  ALT 19  ALKPHOS 53  BILITOT 1.1  PROT 5.0*  ALBUMIN 2.0*   No results for input(s): "LIPASE", "AMYLASE" in the last 168 hours. No results for input(s): "AMMONIA" in the last 168 hours.  ABG    Component Value Date/Time   PHART 7.3 (L) 10/18/2022 1500   PCO2ART 29 (L) 10/18/2022 1500   PO2ART 73 (L) 10/18/2022 1500   HCO3 18.6 (L) 10/19/2022 0211   TCO2 25 11/10/2022 1348   ACIDBASEDEF 5.9 (H) 10/19/2022 0211   O2SAT 82.1 10/19/2022 0211     Coagulation Profile: No results for input(s): "INR", "PROTIME" in the last 168 hours.  Cardiac Enzymes: No results for input(s): "CKTOTAL", "CKMB", "CKMBINDEX", "TROPONINI" in the last 168 hours.  HbA1C: Hgb A1c MFr Bld  Date/Time Value Ref Range Status  10/17/2022 07:25 AM 5.7 (H) 4.8 - 5.6 % Final    Comment:    (NOTE)         Prediabetes: 5.7 - 6.4         Diabetes: >6.4         Glycemic control for adults with diabetes: <7.0     CBG: No results for input(s): "GLUCAP" in the last 168 hours.  Review of Systems:   Unable - as per HPI above obtained from chart and staff  Past Medical History:  He,  has a past medical history of COPD (chronic obstructive pulmonary disease) (HCC),  Schizoaffective disorder, bipolar type (HCC), Sleep apnea, Tardive dyskinesia, and Thyroid disease.   Surgical History:   Past Surgical History:  Procedure Laterality Date   APPENDECTOMY     DIALYSIS/PERMA CATHETER INSERTION N/A 10/27/2022   Procedure: DIALYSIS/PERMA CATHETER INSERTION;  Surgeon: Annice Needy, MD;  Location: [facility] INVASIVE CV LAB;  Service: Cardiovascular;  Laterality: N/A;     Social History:   reports that he has been smoking cigarettes. He has never used smokeless tobacco. He reports that he does not currently use alcohol. He reports that he does not currently use drugs.   Family History:  His family history is not on file.   Allergies Allergies  Allergen Reactions   Haldol [Haloperidol Lactate] Other (See Comments)    Tardive diskonesia   Other     Navy Beans cause patient  to vomit   Thorazine [Chlorpromazine] Other (See Comments)    "messes me all up"   Trazodone And Nefazodone Other (See Comments)    "makes me feel like I'm dying."     Home Medications  Prior to Admission medications   Medication Sig Start Date End Date Taking? Authorizing Provider  acetaminophen (TYLENOL) 325 MG tablet Take 650 mg by mouth every 4 (four) hours as needed.    [provider]  albuterol (VENTOLIN HFA) 108 (90 Base) MCG/ACT inhaler Inhale 2 puffs into the lungs every 4 (four) hours as needed for wheezing or shortness of breath. 01/23/20   Shaune Pollack, MD  alum & mag hydroxide-simeth (MAALOX/MYLANTA) 200-200-20 MG/5ML suspension Take 30 mLs by mouth daily as needed for indigestion or heartburn.    [provider]  benztropine (COGENTIN) 2 MG tablet Take 2 mg by mouth 2 (two) times daily.    [provider]  bismuth subsalicylate (PEPTO BISMOL) 262 MG/15ML suspension Take 15 mLs by mouth every 4 (four) hours as needed.    [provider]  busPIRone (BUSPAR) 15 MG tablet Take 15 mg by mouth 2 (two) times daily.    [provider]   cloZAPine (CLOZARIL) 100 MG tablet Take 100-200 mg by mouth 2 (two) times daily. 100 mg qam 200 mg qpm    [provider]  divalproex (DEPAKOTE ER) 500 MG 24 hr tablet Take 1,000 mg by mouth at bedtime.    [provider]  doxazosin (CARDURA) 4 MG tablet Take 4 mg by mouth daily.    [provider]  epoetin alfa (EPOGEN) 10000 UNIT/ML injection Inject 1 mL (10,000 Units total) into the vein every Monday, Wednesday, and Friday with hemodialysis. 11/05/22   Darlin Priestly, MD  guaiFENesin (ROBITUSSIN) 100 MG/5ML liquid Take 200 mg by mouth every 6 (six) hours as needed for cough.    [provider]  levothyroxine (SYNTHROID) 100 MCG tablet Take 75 mcg by mouth daily before breakfast.    [provider]  loperamide (IMODIUM) 2 MG capsule Take 2 mg by mouth as needed for diarrhea or loose stools.    [provider]  magnesium hydroxide (MILK OF MAGNESIA) 400 MG/5ML suspension Take 30 mLs by mouth daily as needed for mild constipation.    [provider]  meclizine (ANTIVERT) 25 MG tablet Take 25 mg by mouth 3 (three) times daily as needed for dizziness.    [provider]  multivitamin (RENA-VIT) TABS tablet Take 1 tablet by mouth at bedtime. 11/04/22   Darlin Priestly, MD  nicotine (NICODERM CQ - DOSED IN MG/24 HOURS) 21 mg/24hr patch Place 1 patch (21 mg total) onto the skin daily. 11/05/22   Darlin Priestly, MD  Nutritional Supplements (FEEDING SUPPLEMENT, NEPRO CARB STEADY,) LIQD Take 237 mLs by mouth 3 (three) times daily between meals. 11/04/22   Darlin Priestly, MD  pantoprazole (PROTONIX) 40 MG tablet Take 40 mg by mouth daily.    [provider]  sennosides-docusate sodium (SENOKOT-S) 8.6-50 MG tablet Take 1 tablet by mouth in the morning and at bedtime.    [provider]  traZODone (DESYREL) 50 MG tablet Take 50 mg by mouth at bedtime.    [provider]  valbenazine (INGREZZA) 80 MG capsule Take 80 mg by mouth daily.     [provider]  vitamin B-12 (CYANOCOBALAMIN) 100 MCG tablet Take 100 mcg by mouth daily.    [provider]  Vitamin D, Cholecalciferol, 25 MCG (1000  UT) TABS Take 50 mcg by mouth daily.     [provider]     Critical care time:      Dirk Dress, NP Pulmonary/Critical Care Medicine  11/10/2022  3:59 PM

## 2022-11-10 NOTE — Progress Notes (Signed)
Received patient in ICU for bedside treatment.  Alert and oriented.  Informed consent signed and in chart.   TX duration: 0.42 hours  Patient unable to tolerate, with unresponsive episode. Received confirmation from Nephrologist to terminate dialysis treatment.  Alert and responsive at time of this d/c.  Hand-off given to patient's nurse.   Access used: RIJ Perm Cath Access issues: None  Total UF removed: 700 mL Medication(s) given: Heparin 2000 Units bolus dose given Post HD VS: 99 F - 90/61 - 72 - 31 - 93% Post HD weight: Unable to obtain     11/10/22 2145  Vitals  Temp 99 F (37.2 C)  Temp Source Axillary  BP 90/61  MAP (mmHg) 72  BP Location Left Arm  BP Method Automatic  Patient Position (if appropriate) Lying  Pulse Rate 99  Pulse Rate Source Monitor  ECG Heart Rate 98  Resp (!) 31  Oxygen Therapy  SpO2 93 %  Patient Activity (if Appropriate) In bed  Pulse Oximetry Type Continuous  Post Treatment  Dialyzer Clearance Lightly streaked  Duration of HD Treatment -hour(s) 0.42 hour(s)  Hemodialysis Intake (mL) 0 mL  Liters Processed 17  Fluid Removed (mL) 700 mL  Tolerated HD Treatment No (Comment) (Treatment terminated d/t poor patient response.)  Note  Patient Observations Patient alert, responsive to verbal cues, condition stable for this d/c.  Hemodialysis Catheter Right Internal jugular Double lumen Permanent (Tunneled)  Placement Date/Time: 10/27/22 1539   Time Out: Correct patient;Correct site;Correct procedure  Maximum sterile barrier precautions: Hand hygiene;Cap;Mask;Sterile gown;Sterile gloves;Large sterile sheet  Site Prep: Chlorhexidine (preferred);Skin Prep C...  Site Condition No complications  Blue Lumen Status Flushed;Heparin locked;Dead end cap in place  Red Lumen Status Flushed;Heparin locked;Dead end cap in place  Catheter fill solution Heparin 1000 units/ml  Catheter fill volume (Arterial) 1.6 cc  Catheter fill volume (Venous) 1.7  Dressing  Type Transparent  Dressing Status Antimicrobial disc in place;Clean, Dry, Intact  Drainage Description None  Post treatment catheter status Capped and Clamped     [facility]

## 2022-11-11 DIAGNOSIS — R6521 Severe sepsis with septic shock: Secondary | ICD-10-CM

## 2022-11-11 DIAGNOSIS — A419 Sepsis, unspecified organism: Secondary | ICD-10-CM

## 2022-11-11 DIAGNOSIS — J189 Pneumonia, unspecified organism: Secondary | ICD-10-CM | POA: Diagnosis not present

## 2022-11-11 DIAGNOSIS — J9601 Acute respiratory failure with hypoxia: Secondary | ICD-10-CM | POA: Diagnosis not present

## 2022-11-11 LAB — BASIC METABOLIC PANEL
Anion gap: 18 — ABNORMAL HIGH (ref 5–15)
CO2: 21 mmol/L — ABNORMAL LOW (ref 22–32)
Calcium: 8.2 mg/dL — ABNORMAL LOW (ref 8.9–10.3)
Chloride: 97 mmol/L — ABNORMAL LOW (ref 98–111)
Creatinine, Ser: 3.58 mg/dL — ABNORMAL HIGH (ref 0.61–1.24)
GFR, Estimated: 18 mL/min — ABNORMAL LOW (ref >60)
Glucose, Bld: 96 mg/dL (ref 70–99)
Potassium: 3.7 mmol/L (ref 3.5–5.1)
Sodium: 136 mmol/L (ref 135–145)

## 2022-11-11 LAB — CBC
HCT: 25.4 % — ABNORMAL LOW (ref 39.0–52.0)
Hemoglobin: 8 g/dL — ABNORMAL LOW (ref 13.0–17.0)
MCH: 30.5 pg (ref 26.0–34.0)
MCHC: 31.5 g/dL (ref 30.0–36.0)
MCV: 96.9 fL (ref 80.0–100.0)
Platelets: 221 10*3/uL (ref 150–400)
RBC: 2.62 MIL/uL — ABNORMAL LOW (ref 4.22–5.81)
RDW: 18 % — ABNORMAL HIGH (ref 11.5–15.5)
WBC: 4.2 10*3/uL (ref 4.0–10.5)
nRBC: 0 % (ref 0.0–0.2)

## 2022-11-11 LAB — BLOOD CULTURE ID PANEL (REFLEXED) - BCID2
Bacteroides fragilis: NOT DETECTED
CTX-M ESBL: NOT DETECTED
Candida albicans: NOT DETECTED
Candida auris: NOT DETECTED
Candida krusei: NOT DETECTED
Candida parapsilosis: NOT DETECTED
Carbapenem resistance IMP: NOT DETECTED
Carbapenem resistance KPC: NOT DETECTED
Carbapenem resistance NDM: NOT DETECTED
Carbapenem resistance VIM: NOT DETECTED
Cryptococcus neoformans/gattii: NOT DETECTED
Enterobacter cloacae complex: NOT DETECTED
Enterobacterales: NOT DETECTED
Enterococcus Faecium: NOT DETECTED
Enterococcus faecalis: NOT DETECTED
Escherichia coli: NOT DETECTED
Haemophilus influenzae: NOT DETECTED
Klebsiella aerogenes: NOT DETECTED
Klebsiella oxytoca: NOT DETECTED
Klebsiella pneumoniae: NOT DETECTED
Listeria monocytogenes: NOT DETECTED
Neisseria meningitidis: NOT DETECTED
Proteus species: NOT DETECTED
Pseudomonas aeruginosa: NOT DETECTED
Salmonella species: NOT DETECTED
Serratia marcescens: NOT DETECTED
Staphylococcus aureus (BCID): NOT DETECTED
Staphylococcus epidermidis: NOT DETECTED
Staphylococcus lugdunensis: NOT DETECTED
Staphylococcus species: NOT DETECTED
Stenotrophomonas maltophilia: NOT DETECTED
Streptococcus agalactiae: NOT DETECTED
Streptococcus pneumoniae: NOT DETECTED
Streptococcus pyogenes: NOT DETECTED

## 2022-11-11 LAB — CULTURE, BLOOD (ROUTINE X 2)
Special Requests: ADEQUATE
Special Requests: ADEQUATE

## 2022-11-11 LAB — GLUCOSE, CAPILLARY
Glucose-Capillary: 126 mg/dL — ABNORMAL HIGH (ref 70–99)
Glucose-Capillary: 54 mg/dL — ABNORMAL LOW (ref 70–99)
Glucose-Capillary: 73 mg/dL (ref 70–99)
Glucose-Capillary: 76 mg/dL (ref 70–99)
Glucose-Capillary: 77 mg/dL (ref 70–99)
Glucose-Capillary: 83 mg/dL (ref 70–99)
Glucose-Capillary: 84 mg/dL (ref 70–99)
Glucose-Capillary: 88 mg/dL (ref 70–99)

## 2022-11-11 LAB — ORGANISM ID, BACTERIA

## 2022-11-11 LAB — LACTIC ACID, PLASMA: Lactic Acid, Venous: 5.2 mmol/L (ref 0.5–1.9)

## 2022-11-11 LAB — ORGANISM ID BY MALDI

## 2022-11-11 LAB — BLOOD GAS, VENOUS
Drawn by: 7873
O2 Saturation: 50.5 %
Patient temperature: 37.1
pCO2, Ven: 44 mmHg (ref 44–60)
pH, Ven: 7.37 (ref 7.25–7.43)

## 2022-11-11 LAB — MAGNESIUM: Magnesium: 1.3 mg/dL — ABNORMAL LOW (ref 1.7–2.4)

## 2022-11-11 LAB — PHOSPHORUS: Phosphorus: 5 mg/dL — ABNORMAL HIGH (ref 2.5–4.6)

## 2022-11-11 MED ORDER — TOBRAMYCIN SULFATE 80 MG/2ML IJ SOLN
200.0000 mg | Freq: Once | INTRAVENOUS | Status: AC
  Administered 2022-11-11: 200 mg via INTRAVENOUS
  Filled 2022-11-11: qty 5

## 2022-11-11 MED ORDER — MAGNESIUM SULFATE 50 % IJ SOLN
6.0000 g | Freq: Once | INTRAVENOUS | Status: AC
  Administered 2022-11-11: 6 g via INTRAVENOUS
  Filled 2022-11-11: qty 12

## 2022-11-11 MED ORDER — DEXTROSE 50 % IV SOLN
INTRAVENOUS | Status: AC
  Filled 2022-11-11: qty 50

## 2022-11-11 MED ORDER — POTASSIUM CHLORIDE 20 MEQ PO PACK: 40.0000 meq | PACK | Freq: Once | ORAL | Status: DC

## 2022-11-11 MED ORDER — DEXMEDETOMIDINE HCL IN NACL 400 MCG/100ML IV SOLN
0.0000 ug/kg/h | INTRAVENOUS | Status: DC
  Administered 2022-11-11: 0.4 ug/kg/h via INTRAVENOUS
  Administered 2022-11-11: 0.6 ug/kg/h via INTRAVENOUS
  Administered 2022-11-12 – 2022-11-13 (×2): 0.4 ug/kg/h via INTRAVENOUS
  Administered 2022-11-13 – 2022-11-14 (×2): 0.8 ug/kg/h via INTRAVENOUS
  Administered 2022-11-14: 0.9 ug/kg/h via INTRAVENOUS
  Filled 2022-11-11 (×8): qty 100

## 2022-11-11 MED ORDER — POTASSIUM CHLORIDE 10 MEQ/100ML IV SOLN
10.0000 meq | INTRAVENOUS | Status: AC
  Administered 2022-11-11 (×4): 10 meq via INTRAVENOUS
  Filled 2022-11-11 (×4): qty 100

## 2022-11-11 MED ORDER — DEXTROSE-SODIUM CHLORIDE 5-0.9 % IV SOLN
INTRAVENOUS | Status: DC
  Administered 2022-11-15: 10 mL/h via INTRAVENOUS

## 2022-11-11 MED ORDER — SODIUM CHLORIDE 0.9 % IV SOLN
3.0000 g | Freq: Two times a day (BID) | INTRAVENOUS | Status: DC
  Administered 2022-11-11 – 2022-11-16 (×12): 3 g via INTRAVENOUS
  Filled 2022-11-11 (×13): qty 8

## 2022-11-11 NOTE — Progress Notes (Addendum)
NAME:  Austin Marsh, MRN:  [id number], DOB:  11-Mar-1956, LOS: 1 ADMISSION DATE:  11/10/2022, CONSULTATION DATE:  7/24 REFERRING MD:  Dr. Dalene Seltzer, CHIEF COMPLAINT:  hypoxic respiratory failure; hypotension   History of Present Illness:  Patient is a 67 yo M w/ pertinent Pmh COPD, ESRD recently started on HD, schizoaffective w/ bipolar disorder, depression/anxiety, hypothyroidism, tobacco abuse, OSA on cpap present to [facility] ED 7/24 w/ hypoxic respiratory failure.  Of note patient recently admitted to [facility] 6/30 for lethargy. Found to have acute urinary retention and severe AKI on CKD 3a requiring dialysis. During hospital course, patient had hypovolemic shock and anemia from left neck hematoma. BP improved w/ PRBCs and fluids. Started on midodrine. Patient had RLL PNA and completed course of abx. Had rhabdo which improved w/ fluid resuscitation. Discharged on 7/18.   On 7/24 patient after dialysis having sob and hypoxia. Sats in 73s. Increased [place] to 4L. EMS called and placed on NRB and transferred to [facility] ED. On ED arrival sats 97% on NRB. BP 106/60. Patient placed on bipap. RR 30s. Afebrile. BNP 1,071, LA 4.8,  and trop 173. CXR w/ opacities b/l concerning for mulilobar pneumonia. Cultures obtained and started on cefepime/vanc. Covid/flu pending. PCCM consulted.   Pertinent ED Labs: wbc, 4.1, hgb 8, AG 18,   Pertinent  Medical History  COPD ESRD recently started on HD schizoaffective w/ bipolar disorder depression/anxiety Hypothyroidism tobacco abuse OSA on cpap Significant Hospital Events: Including procedures, antibiotic start and stop dates in addition to other pertinent events   7/24 admitted to [facility] w/ resp failure w/ hypoxia on bipap, started on unasyn and tobramycin for acinetobacter baumannii PNA and bacteremia.   Interim History / Subjective:  Patient switched to NRB as he was too sedated and/or delirious to protect his airway.   Objective   Blood pressure 104/65, pulse 96,  temperature 98.6 F (37 C), temperature source Axillary, resp. rate (!) 36, height 5\' 9"  (1.753 m), weight 93.3 kg, SpO2 92%.    Vent Mode: BIPAP;PCV FiO2 (%):  [60 %-100 %] 60 % Set Rate:  [15 bmp] 15 bmp PEEP:  [6 cmH20-10 cmH20] 10 cmH20 Pressure Support:  [12 cmH20-15 cmH20] 15 cmH20   Intake/Output Summary (Last 24 hours) at 11/11/2022 1008 Last data filed at 11/11/2022 7846 Gross per 24 hour  Intake 1440.98 ml  Output 700 ml  Net 740.98 ml   Filed Weights   11/10/22 1334 11/11/22 0609  Weight: 101.2 kg 93.3 kg    Examination: General: chronically ill appearing male, acute respiratory distress  HENT: mm dry, no JVD  Lungs: resps diminished throughout with few rales, increased work of breathing Cardiovascular: s1s2 rrr Abdomen: soft, non tender  Extremities: warm and dry, 3+ pitting edema  Neuro: awake, disoriented, mumbling at times, MAE   Resolved Hospital Problem list    Assessment & Plan:  Acute respiratory failure w/ hypoxia Parainfluenza with superimposed Acinetobacter baumannii multifocal pneumonia +/- Pulmonary edema COPD OSA on cpap Patient has been too disoriented/delirious to tolerate BiPap. Now on NRB.  Plan: Respiratory status tenuous. Note DNR/DNI  - consider retry bipap if increased WOB and is able to stay more alert, holding for now  - NRB -titrate O2 as able to keep sats >92% - Unasyn and tobramycin per pharmacy  - pulm toiletry - guafenesin - HD for volume removal  Hypotension - could be due to ESRD and/or septic shock s/t acinetobacter bacteremia  Lactic Acidosis Patient on levo 6, required to start pressors  during HD. On peripheral pressors, cannot give more than 10 of levo.  Plan:  - Continue levo as needed to maintain MAP > 65  New diagnosis of ESRD on HD  Patient with AKI secondary to rhabdo at previous admission, worsened to ESRD. HD yesterday and required pressors, continues to be very edematous and requiring oxygen. Incomplete dialysis  session yesterday, BP did not tolerate.  Plan: - nephro consult for HD - Continue pressors as needed to continue dialysis  -Trend BMP / urinary output - Replace electrolytes as indicated - Avoid nephrotoxic agents, ensure adequate renal perfusion  Anemia  Patient with chronic anemia with exacerbation at last admission in June secondary to large hematoma requiring transfusions. Was receiving IV Fe with HD.  Now Hgb 8.0.  Plan:  - Hold IV Fe given bacteremia  - Nephro to give EPO with HD  - Monitor CBC   Schizoaffective disorder, bipolar type Depression with anxiety Plan: - holding home meds   Hypothyroidism Plan: -resume synthroid when able to take PO   Tobacco abuse Plan: -nicotine patch  BPH Plan: -doxazosin when able to take po  Will attempt to reach family for further discussion/clarification goals of care. Note DNR/DNI.   Best Practice (right click and "Reselect all SmartList Selections" daily)   Diet/type: NPO DVT prophylaxis: prophylactic heparin  GI prophylaxis: PPI Lines: N/A Foley:  N/A Code Status:  DNR Last date of multidisciplinary goals of care discussion [will update family today]  Labs   CBC: Recent Labs  Lab 11/10/22 1336 11/10/22 1348 11/10/22 1730 11/10/22 2135 11/11/22 0102  WBC 4.1  --  2.3*  --  4.2  NEUTROABS 2.2  --   --   --   --   HGB 8.0* 8.8* 7.9* 8.2* 8.0*  HCT 25.8* 26.0* 24.5* 24.0* 25.4*  MCV 98.9  --  99.6  --  96.9  PLT 255  --  214  --  221    Basic Metabolic Panel: Recent Labs  Lab 11/10/22 1336 11/10/22 1348 11/10/22 1551 11/10/22 1730 11/10/22 2135 11/11/22 0102  NA 141 139  --   --  138 136  K 3.9 4.0  --   --  3.9 3.7  CL 100 101  --   --   --  97*  CO2 23  --   --   --   --  21*  GLUCOSE 83 78  --   --   --  96  BUN 35* 33*  --   --   --  37*  CREATININE 3.81* 3.90* 3.84*  --   --  3.58*  CALCIUM 8.4*  --   --   --   --  8.2*  MG  --   --   --   --   --  1.3*  PHOS  --   --   --  5.1*  --  5.0*    GFR: Estimated Creatinine Clearance: 22.3 mL/min (A) (by C-G formula based on SCr of 3.58 mg/dL (H)). Recent Labs  Lab 11/10/22 1336 11/10/22 1345 11/10/22 1551 11/10/22 1730 11/11/22 0102  PROCALCITON  --   --  12.94  --   --   WBC 4.1  --   --  2.3* 4.2  LATICACIDVEN  --  4.8*  --  6.0* 5.2*    Liver Function Tests: Recent Labs  Lab 11/10/22 1336  AST 31  ALT 19  ALKPHOS 53  BILITOT 1.1  PROT 5.0*  ALBUMIN 2.0*  No results for input(s): "LIPASE", "AMYLASE" in the last 168 hours. No results for input(s): "AMMONIA" in the last 168 hours.  ABG    Component Value Date/Time   PHART 7.3 (L) 10/18/2022 1500   PCO2ART 29 (L) 10/18/2022 1500   PO2ART 73 (L) 10/18/2022 1500   HCO3 25.4 11/11/2022 0102   TCO2 28 11/10/2022 2135   ACIDBASEDEF 5.9 (H) 10/19/2022 0211   O2SAT 50.5 11/11/2022 0102     Coagulation Profile: No results for input(s): "INR", "PROTIME" in the last 168 hours.  Cardiac Enzymes: No results for input(s): "CKTOTAL", "CKMB", "CKMBINDEX", "TROPONINI" in the last 168 hours.  HbA1C: Hgb A1c MFr Bld  Date/Time Value Ref Range Status  10/17/2022 07:25 AM 5.7 (H) 4.8 - 5.6 % Final    Comment:    (NOTE)         Prediabetes: 5.7 - 6.4         Diabetes: >6.4         Glycemic control for adults with diabetes: <7.0     CBG: Recent Labs  Lab 11/10/22 1935 11/10/22 2344 11/11/22 0019 11/11/22 0400 11/11/22 0720  GLUCAP 72 68* 126* 84 83    Review of Systems:   Difficult to understand patient, disoriented and delirious   Past Medical History:  He,  has a past medical history of COPD (chronic obstructive pulmonary disease) (HCC), Schizoaffective disorder, bipolar type (HCC), Sleep apnea, Tardive dyskinesia, and Thyroid disease.   Surgical History:   Past Surgical History:  Procedure Laterality Date   APPENDECTOMY     DIALYSIS/PERMA CATHETER INSERTION N/A 10/27/2022   Procedure: DIALYSIS/PERMA CATHETER INSERTION;  Surgeon: Annice Needy,  MD;  Location: [facility] INVASIVE CV LAB;  Service: Cardiovascular;  Laterality: N/A;     Social History:   reports that he has been smoking cigarettes. He has never used smokeless tobacco. He reports that he does not currently use alcohol. He reports that he does not currently use drugs.   Family History:  His family history is not on file.   Allergies Allergies  Allergen Reactions   Haldol [Haloperidol Lactate] Other (See Comments)    Tardive diskonesia   Other     Navy Beans cause patient to vomit   Thorazine [Chlorpromazine] Other (See Comments)    "messes me all up"   Trazodone And Nefazodone Other (See Comments)    "makes me feel like I'm dying."     Home Medications  Prior to Admission medications   Medication Sig Start Date End Date Taking? Authorizing Provider  acetaminophen (TYLENOL) 325 MG tablet Take 650 mg by mouth every 4 (four) hours as needed.    [provider]  albuterol (VENTOLIN HFA) 108 (90 Base) MCG/ACT inhaler Inhale 2 puffs into the lungs every 4 (four) hours as needed for wheezing or shortness of breath. 01/23/20   Shaune Pollack, MD  alum & mag hydroxide-simeth (MAALOX/MYLANTA) 200-200-20 MG/5ML suspension Take 30 mLs by mouth daily as needed for indigestion or heartburn.    [provider]  benztropine (COGENTIN) 2 MG tablet Take 2 mg by mouth 2 (two) times daily.    [provider]  bismuth subsalicylate (PEPTO BISMOL) 262 MG/15ML suspension Take 15 mLs by mouth every 4 (four) hours as needed.    [provider]  busPIRone (BUSPAR) 15 MG tablet Take 15 mg by mouth 2 (two) times daily.    [provider]  cloZAPine (CLOZARIL) 100 MG tablet Take 100-200 mg by mouth 2 (two) times  daily. 100 mg qam 200 mg qpm    [provider]  divalproex (DEPAKOTE ER) 500 MG 24 hr tablet Take 1,000 mg by mouth at bedtime.    [provider]  doxazosin (CARDURA) 4 MG tablet Take 4 mg by mouth daily.    [provider]  epoetin alfa (EPOGEN) 10000 UNIT/ML injection Inject 1 mL (10,000 Units total) into the vein every Monday, Wednesday, and Friday with hemodialysis. 11/05/22   Darlin Priestly, MD  guaiFENesin (ROBITUSSIN) 100 MG/5ML liquid Take 200 mg by mouth every 6 (six) hours as needed for cough.    [provider]  levothyroxine (SYNTHROID) 100 MCG tablet Take 75 mcg by mouth daily before breakfast.    [provider]  loperamide (IMODIUM) 2 MG capsule Take 2 mg by mouth as needed for diarrhea or loose stools.    [provider]  magnesium hydroxide (MILK OF MAGNESIA) 400 MG/5ML suspension Take 30 mLs by mouth daily as needed for mild constipation.    [provider]  meclizine (ANTIVERT) 25 MG tablet Take 25 mg by mouth 3 (three) times daily as needed for dizziness.    [provider]  multivitamin (RENA-VIT) TABS tablet Take 1 tablet by mouth at bedtime. 11/04/22   Darlin Priestly, MD  nicotine (NICODERM CQ - DOSED IN MG/24 HOURS) 21 mg/24hr patch Place 1 patch (21 mg total) onto the skin daily. 11/05/22   Darlin Priestly, MD  Nutritional Supplements (FEEDING SUPPLEMENT, NEPRO CARB STEADY,) LIQD Take 237 mLs by mouth 3 (three) times daily between meals. 11/04/22   Darlin Priestly, MD  pantoprazole (PROTONIX) 40 MG tablet Take 40 mg by mouth daily.    [provider]  sennosides-docusate sodium (SENOKOT-S) 8.6-50 MG tablet Take 1 tablet by mouth in the morning and at bedtime.    [provider]  traZODone (DESYREL) 50 MG tablet Take 50 mg by mouth at bedtime.    [provider]  valbenazine (INGREZZA) 80 MG capsule Take 80 mg by mouth daily.    [provider]  vitamin B-12 (CYANOCOBALAMIN) 100 MCG tablet Take 100 mcg by mouth daily.    [provider]  Vitamin D, Cholecalciferol, 25 MCG (1000 UT) TABS Take 50 mcg by mouth daily.     [provider]     Lockie Mola, MD  PGY-2 [facility]

## 2022-11-11 NOTE — Progress Notes (Signed)
Pharmacy Antibiotic Note  Austin Marsh is a 67 y.o. male admitted on 11/10/2022 with pneumonia, recent AKI requiring hemodialysis.  Blood cultures growing Acinetobacter baumannii.  Pharmacy has been consulted for tobramycin dosing.    Plan: Tobramcyin 200 mg IV now   F/U plans for dialysis and redose as indicated  Height: 5\' 9"  (175.3 cm) Weight: 101.2 kg (223 lb) IBW/kg (Calculated) : 70.7  Temp (24hrs), Avg:98.5 F (36.9 C), Min:97 F (36.1 C), Max:99 F (37.2 C)  Recent Labs  Lab 11/10/22 1336 11/10/22 1345 11/10/22 1348 11/10/22 1551 11/10/22 1730 11/11/22 0102  WBC 4.1  --   --   --  2.3* 4.2  CREATININE 3.81*  --  3.90* 3.84*  --  3.58*  LATICACIDVEN  --  4.8*  --   --  6.0* 5.2*    Estimated Creatinine Clearance: 23.2 mL/min (A) (by C-G formula based on SCr of 3.58 mg/dL (H)).    Allergies  Allergen Reactions   Haldol [Haloperidol Lactate] Other (See Comments)    Tardive diskonesia   Other     Navy Beans cause patient to vomit   Thorazine [Chlorpromazine] Other (See Comments)    "messes me all up"   Trazodone And Nefazodone Other (See Comments)    "makes me feel like I'm dying."    Antimicrobials this admission: Vancomycin  7/24 Cefepime 7/24  Unasyn 7/25 >> Tobramycin 7/25 >>   Microbiology results: 7/24 BCx: Acinetobacter baumannii   [name] 11/11/2022 2:15 AM

## 2022-11-11 NOTE — Progress Notes (Signed)
Called Patient's daughter Austin Marsh, updated her regarding patient's bacteremia and hypotension requiring peripheral pressors. I explained that patient did not tolerate full session of HD yesterday due to hypotension. I also explained that patient has been on Bipap and requiring precedex and now on max dose of peripheral pressors. I asked what she believes patient's wishes would be regarding a central line and increased pressors. She said she is not sure but wants patient to have a full shot of getting better. Says she would like to try the central line and see if it would help.  She understands that patient may not be able to tolerate dialysis for much longer despite central pressors.   Lockie Mola, MD  PGY-2 [facility]

## 2022-11-11 NOTE — Progress Notes (Signed)
PHARMACY - PHYSICIAN COMMUNICATION CRITICAL VALUE ALERT - BLOOD CULTURE IDENTIFICATION (BCID)  Austin Marsh is an 67 y.o. male who presented to [facility] on 11/10/2022 with a chief complaint of respiratory failure/PNA  Assessment:   Blood cultures growing Acinetobacter baumannii complex  Name of physician (or Provider) Contacted:  Dr. Delia Chimes  Current antibiotics:  Vancomycin and Cefepime   Changes to prescribed antibiotics recommended:  Change to Unasyn and Tobramycin  Results for orders placed or performed during the hospital encounter of 11/10/22  Blood Culture ID Panel (Reflexed) (Collected: 11/10/2022  2:16 PM)  Result Value Ref Range   Enterococcus faecalis NOT DETECTED NOT DETECTED   Enterococcus Faecium NOT DETECTED NOT DETECTED   Listeria monocytogenes NOT DETECTED NOT DETECTED   Staphylococcus species NOT DETECTED NOT DETECTED   Staphylococcus aureus (BCID) NOT DETECTED NOT DETECTED   Staphylococcus epidermidis NOT DETECTED NOT DETECTED   Staphylococcus lugdunensis NOT DETECTED NOT DETECTED   Streptococcus species NOT DETECTED NOT DETECTED   Streptococcus agalactiae NOT DETECTED NOT DETECTED   Streptococcus pneumoniae NOT DETECTED NOT DETECTED   Streptococcus pyogenes NOT DETECTED NOT DETECTED   A.calcoaceticus-baumannii DETECTED (A) NOT DETECTED   Bacteroides fragilis NOT DETECTED NOT DETECTED   Enterobacterales NOT DETECTED NOT DETECTED   Enterobacter cloacae complex NOT DETECTED NOT DETECTED   Escherichia coli NOT DETECTED NOT DETECTED   Klebsiella aerogenes NOT DETECTED NOT DETECTED   Klebsiella oxytoca NOT DETECTED NOT DETECTED   Klebsiella pneumoniae NOT DETECTED NOT DETECTED   Proteus species NOT DETECTED NOT DETECTED   Salmonella species NOT DETECTED NOT DETECTED   Serratia marcescens NOT DETECTED NOT DETECTED   Haemophilus influenzae NOT DETECTED NOT DETECTED   Neisseria meningitidis NOT DETECTED NOT DETECTED   Pseudomonas aeruginosa NOT DETECTED NOT  DETECTED   Stenotrophomonas maltophilia NOT DETECTED NOT DETECTED   Candida albicans NOT DETECTED NOT DETECTED   Candida auris NOT DETECTED NOT DETECTED   Candida glabrata NOT DETECTED NOT DETECTED   Candida krusei NOT DETECTED NOT DETECTED   Candida parapsilosis NOT DETECTED NOT DETECTED   Candida tropicalis NOT DETECTED NOT DETECTED   Cryptococcus neoformans/gattii NOT DETECTED NOT DETECTED   CTX-M ESBL NOT DETECTED NOT DETECTED   Carbapenem resistance IMP NOT DETECTED NOT DETECTED   Carbapenem resistance KPC NOT DETECTED NOT DETECTED   Carbapenem resistance NDM NOT DETECTED NOT DETECTED   Carbapenem resistance VIM NOT DETECTED NOT DETECTED    [name] 11/11/2022  1:46 AM

## 2022-11-11 NOTE — Progress Notes (Signed)
[facility] Progress Note  Subjective: they attempted HD overnight but 45 min into the session the pt became unresponsive so he was rinsed back and HD aborted. Today pt is somnolent, on high dose of peripheral IV levo gtt. BP 70s.    Vitals:   11/11/22 1315 11/11/22 1345 11/11/22 1400 11/11/22 1417  BP: (!) 80/60 92/67 (!) 74/60 95/74  Pulse: 89 86 78 88  Resp: (!) 29 (!) 27 (!) 29 (!) 23  Temp:      TempSrc:      SpO2: 99% 100% 100% 100%  Weight:      Height:        Exam: Gen somnolent, did not awaken to voice, on FM O2 (bipap) +jvd 10-12 cm Chest coarse rhonchi bibasilar RRR no RG Abd soft ntnd no mass or ascites +bs Ext diffuse 2-3+ pitting bilat LE edema from feet to hips Neuro is as above, NF    RIJ TDC       Home meds- tylenol, ventolin, maalox, pepto bismol, buspar bid, clozapine bid, depakote er, cardura 4mg  every day, robitussin, synthroid, imodium, MOM, antivert, renavite, nicotine patch, nepro supps, protonix, senokot-s, desyrel, ingrezza, b-12, vit D      OP HD: [facility] MWF  3.5h   400/300  97.7kg   2/2.25 bath  RIJ TDC  Heparin 2000 - last and only OP HD was on 7/22, post wt 98.4kg - venofer 100mg  IV q HD thru 8/16 - no esa or vdra     Assessment/ Plan: Acute hypoxic resp failure - w/ severe pna -like opacities in the L lung > R. Also has severe anasarca of LE's. No sure if there is an element of pulm edema. We attempted iHD overnight last night but pt didn't tolerate and became unresponsive to the procedure was aborted. Today the patient is too unstable for dialysis. Pt is poo CRRT candidate given comorbidities. Poor prognosis overall and pt is DNR/ DNI. Will follow along and reassess on a daily basis.  Severe AKI (acute rhabdo) - dialysis dependent since 10/23/22, started at [facility]. Pt is not esrd yet. Dc'd 7/18 and had one OP HD on 7/22.  HTN/ volume - marked LE edema. Hypotensive on pressor support Anemia esrd - Hb 8- 9 here, getting IV Fe in OP  setting but will hold off due to infection. Will start esa w/ darbe 60 mcg sq weekly while pt is here.  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 No vdra or binder listed. Follow.  Schizoaffective d/o - is on multiple psychiatric meds  Hypothyroidism     Vinson Moselle MD  [facility] 11/11/2022, 2:33 PM  Recent Labs  Lab 11/10/22 1336 11/10/22 1348 11/10/22 1551 11/10/22 1730 11/10/22 2135 11/11/22 0102  HGB 8.0*   < >  --  7.9* 8.2* 8.0*  ALBUMIN 2.0*  --   --   --   --   --   CALCIUM 8.4*  --   --   --   --  8.2*  PHOS  --   --   --  5.1*  --  5.0*  CREATININE 3.81*   < > 3.84*  --   --  3.58*  K 3.9   < >  --   --  3.9 3.7   < > = values in this interval not displayed.   No results for input(s): "IRON", "TIBC", "FERRITIN" in the last 168 hours. Inpatient medications:  Chlorhexidine Gluconate Cloth  6 each Topical Q0600  darbepoetin (ARANESP) injection - DIALYSIS  60 mcg Subcutaneous Q Wed-1800   heparin  5,000 Units Subcutaneous Q8H   nicotine  14 mg Transdermal Daily    ampicillin-sulbactam (UNASYN) IV Stopped (11/11/22 0939)   dexmedetomidine (PRECEDEX) IV infusion 0.3 mcg/kg/hr (11/11/22 1310)   dextrose 5 % and 0.9 % NaCl 10 mL/hr at 11/11/22 1400   norepinephrine (LEVOPHED) Adult infusion 10 mcg/min (11/11/22 1400)   docusate, heparin, ipratropium-albuterol, polyethylene glycol

## 2022-11-11 NOTE — Plan of Care (Signed)
  Problem: Fluid Volume: Goal: Hemodynamic stability will improve Outcome: Not Progressing   Problem: Clinical Measurements: Goal: Diagnostic test results will improve Outcome: Not Progressing   Problem: Clinical Measurements: Goal: Signs and symptoms of infection will decrease Outcome: Not Progressing   Problem: Respiratory: Goal: Ability to maintain adequate ventilation will improve Outcome: Not Progressing   Problem: Education: Goal: Knowledge of General Education information will improve Description: Including pain rating scale, medication(s)/side effects and non-pharmacologic comfort measures Outcome: Not Progressing   Problem: Health Behavior/Discharge Planning: Goal: Ability to manage health-related needs will improve Outcome: Not Progressing   Problem: Clinical Measurements: Goal: Ability to maintain clinical measurements within normal limits will improve Outcome: Not Progressing   Problem: Clinical Measurements: Goal: Will remain free from infection Outcome: Not Progressing   Problem: Clinical Measurements: Goal: Respiratory complications will improve Outcome: Not Progressing

## 2022-11-11 NOTE — Progress Notes (Signed)
Hypoglycemic Event  CBG: 68  Treatment: D50 25 mL (12.5 gm)  Symptoms: None  Follow-up CBG: Time:0019 CBG Result:126  Possible Reasons for Event: Inadequate meal intake  Comments/MD notified: Standing orders/protocol initiated    Adelene Idler

## 2022-11-12 DIAGNOSIS — J9601 Acute respiratory failure with hypoxia: Secondary | ICD-10-CM | POA: Diagnosis not present

## 2022-11-12 DIAGNOSIS — J189 Pneumonia, unspecified organism: Secondary | ICD-10-CM | POA: Diagnosis not present

## 2022-11-12 LAB — GLUCOSE, CAPILLARY
Glucose-Capillary: 91 mg/dL (ref 70–99)
Glucose-Capillary: 85 mg/dL (ref 70–99)
Glucose-Capillary: 92 mg/dL (ref 70–99)
Glucose-Capillary: 75 mg/dL (ref 70–99)
Glucose-Capillary: 85 mg/dL (ref 70–99)
Glucose-Capillary: 88 mg/dL (ref 70–99)

## 2022-11-12 LAB — CBC
HCT: 23.9 % — ABNORMAL LOW (ref 39.0–52.0)
Hemoglobin: 7.8 g/dL — ABNORMAL LOW (ref 13.0–17.0)
MCH: 31.7 pg (ref 26.0–34.0)
MCHC: 32.6 g/dL (ref 30.0–36.0)
MCV: 97.2 fL (ref 80.0–100.0)
Platelets: 185 10*3/uL (ref 150–400)
RBC: 2.46 MIL/uL — ABNORMAL LOW (ref 4.22–5.81)
RDW: 17.5 % — ABNORMAL HIGH (ref 11.5–15.5)
WBC: 24.1 10*3/uL — ABNORMAL HIGH (ref 4.0–10.5)
nRBC: 0.1 % (ref 0.0–0.2)

## 2022-11-12 LAB — RENAL FUNCTION PANEL
Albumin: 2 g/dL — ABNORMAL LOW (ref 3.5–5.0)
Anion gap: 19 — ABNORMAL HIGH (ref 5–15)
BUN: 58 mg/dL — ABNORMAL HIGH (ref 8–23)
CO2: 21 mmol/L — ABNORMAL LOW (ref 22–32)
Calcium: 8.1 mg/dL — ABNORMAL LOW (ref 8.9–10.3)
Chloride: 99 mmol/L (ref 98–111)
Creatinine, Ser: 3.96 mg/dL — ABNORMAL HIGH (ref 0.61–1.24)
GFR, Estimated: 16 mL/min — ABNORMAL LOW (ref >60)
Glucose, Bld: 96 mg/dL (ref 70–99)
Phosphorus: 6 mg/dL — ABNORMAL HIGH (ref 2.5–4.6)
Potassium: 4.8 mmol/L (ref 3.5–5.1)
Sodium: 139 mmol/L (ref 135–145)

## 2022-11-12 LAB — MAGNESIUM: Magnesium: 2.8 mg/dL — ABNORMAL HIGH (ref 1.7–2.4)

## 2022-11-12 LAB — TOBRAMYCIN LEVEL, RANDOM: Tobramycin Rm: 1.9 ug/mL

## 2022-11-12 MED ORDER — DILTIAZEM HCL-DEXTROSE 125-5 MG/125ML-% IV SOLN (PREMIX)
5.0000 mg/h | INTRAVENOUS | Status: DC
  Administered 2022-11-12: 5 mg/h via INTRAVENOUS
  Filled 2022-11-12: qty 125

## 2022-11-12 MED ORDER — CHLORHEXIDINE GLUCONATE CLOTH 2 % EX PADS
6.0000 | MEDICATED_PAD | CUTANEOUS | Status: DC
  Administered 2022-11-13: 6 via TOPICAL

## 2022-11-12 MED ORDER — MIDODRINE HCL 5 MG PO TABS: 10.0000 mg | ORAL_TABLET | Freq: Three times a day (TID) | ORAL | Status: DC

## 2022-11-12 MED ORDER — HEPARIN SODIUM (PORCINE) 1000 UNIT/ML IJ SOLN
INTRAMUSCULAR | Status: AC
  Filled 2022-11-12: qty 4

## 2022-11-12 MED ORDER — CHLORHEXIDINE GLUCONATE CLOTH 2 % EX PADS: 6.0000 | MEDICATED_PAD | Freq: Every day | CUTANEOUS | Status: DC

## 2022-11-12 MED ORDER — AMIODARONE IV BOLUS ONLY 150 MG/100ML
150.0000 mg | Freq: Once | INTRAVENOUS | Status: AC
  Administered 2022-11-12: 150 mg via INTRAVENOUS
  Filled 2022-11-12: qty 100

## 2022-11-12 NOTE — Progress Notes (Addendum)
eLink Physician-Brief Progress Note Patient Name: Austin Marsh DOB: Jul 02, 1955 MRN: [id number]   Date of Service  11/12/2022  HPI/Events of Note  Pt in Afib RVR 120-150's. Getting HD.   Went into [place] onset Afib RVR earlier today when he was taken off BIPAP and recevied Amio bolus  Camera eval done and discussed with RN. HR 130 to 140, a fib. On BiPAP, sats good. Obese. DNR status. MAP > 68 Not on presor.  Data, notes reviewed EKG from earlier: a fib. - trend troponine  eICU Interventions  - start Cardizem drip w/o bolus No need for AC at this time, no hx of CHF or HTN or DM or CVA.  - consult cardiologist in AM.      Intervention Category Intermediate Interventions: Arrhythmia - evaluation and management  Ranee Gosselin 11/12/2022, 9:47 PM  01:51 Troponin decreasing.

## 2022-11-12 NOTE — Progress Notes (Addendum)
[facility] Progress Note   Subjective: seen in ICU, FiO2 down to 0.30 bipap. BP' s remain soft on low dose levo gtt.          Vitals:    11/12/22 0615 11/12/22 0630 11/12/22 0645 11/12/22 0712  BP: (!) 82/61 (!) 88/64 102/70    Pulse: 66 66 69    Resp: 17 12 19     Temp:       97.8 F (36.6 C)  TempSrc:       Oral  SpO2: 100% 100% 100%    Weight:          Height:              Exam: Gen somnolent, bipap Neck no jvd Chest coarse rhonchi bilat RRR no RG Abd soft ntnd no mass or ascites +bs Ext diffuse 2-3+ pitting bilat LE edema up to hips Neuro as above, NF    RIJ TDC       Home meds- tylenol, ventolin, maalox, pepto bismol, buspar bid, clozapine bid, depakote er, cardura 4mg  every day, robitussin, synthroid, imodium, MOM, antivert, renavite, nicotine patch, nepro supps, protonix, senokot-s, desyrel, ingrezza, b-12, vit D      OP HD: [facility] M[facility]  3.5h   400/300  97.7kg   2/2.25 bath  RIJ TDC  Heparin 2000 - last and only OP HD was on 7/22, post wt 98.4kg - venofer 100mg  IV q HD thru 8/16 - no esa or vdra     Assessment/ Plan: Acute hypoxic resp failure - primarily due to infection/ pna. On bipap w/ FiO2 down to 40%. Per CCM.  Parainfluenza infection / bact PNA - RVP + parainlfluenza and blood cx's + for acinetobacter baumannii. On IV Unasyn now.  Severe AKI / dialysis dependent - since 10/23/22, started at [facility]. Pt is not esrd yet. Last OP HD was 7/22. Did not tolerate HD here on 7/24. Plan HD today w/ pressor support.  HTN/ volume - marked LE edema. Hypotensive on pressor support. Doubt we can get much fluid off until BP's are better. Will start ng midodrine 10 tid.  Anemia esrd - Hb 8- 9 here, getting IV Fe in OP setting but will hold off due to infection. Was not on esa at OP unit -> started here w/ darbe 60 mcg sq weekly.  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 No vdra or binder listed. Follow.  Schizoaffective d/o - is on multiple psychiatric meds  Hypothyroidism   DNI / DNR     Vinson Moselle MD  [facility] 11/12/2022, 8:28 AM

## 2022-11-12 NOTE — Progress Notes (Signed)
Pt receives out-pt HD at [facility] on MWF. Pt admitted from snf per chart review. Will assist as needed.   Olivia Canter Renal Navigator [phone]

## 2022-11-12 NOTE — Progress Notes (Signed)
NAME:  Austin Marsh, MRN:  [id number], DOB:  [birth year], LOS: 2 ADMISSION DATE:  11/10/2022, CONSULTATION DATE:  7/24 REFERRING MD:  Dr. Dalene Seltzer, CHIEF COMPLAINT:  hypoxic respiratory failure; hypotension   History of Present Illness:  Patient is a 67 yo M w/ pertinent Pmh COPD, ESRD recently started on HD, schizoaffective w/ bipolar disorder, depression/anxiety, hypothyroidism, tobacco abuse, OSA on cpap present to [facility] ED 7/24 w/ hypoxic respiratory failure.  Of note patient recently admitted to [facility] 6/30 for lethargy. Found to have acute urinary retention and severe AKI on CKD 3a requiring dialysis. During hospital course, patient had hypovolemic shock and anemia from left neck hematoma. BP improved w/ PRBCs and fluids. Started on midodrine. Patient had RLL PNA and completed course of abx. Had rhabdo which improved w/ fluid resuscitation. Discharged on 7/18.   On 7/24 patient after dialysis having sob and hypoxia. Sats in 34s. Increased [place] to 4L. EMS called and placed on NRB and transferred to [facility] ED. On ED arrival sats 97% on NRB. BP 106/60. Patient placed on bipap. RR 30s. Afebrile. BNP 1,071, LA 4.8,  and trop 173. CXR w/ opacities b/l concerning for mulilobar pneumonia. Cultures obtained and started on cefepime/vanc. Covid/flu pending. PCCM consulted.   Pertinent ED Labs: wbc, 4.1, hgb 8, AG 18,   Pertinent  Medical History  COPD ESRD recently started on HD schizoaffective w/ bipolar disorder depression/anxiety Hypothyroidism tobacco abuse OSA on cpap Significant Hospital Events: Including procedures, antibiotic start and stop dates in addition to other pertinent events   7/24 admitted to [facility] w/ resp failure w/ hypoxia on bipap, started on unasyn and tobramycin for acinetobacter baumannii PNA and bacteremia.   Interim History / Subjective:  Patient weaned off of precedex overnight. Attempted to switch patient to [facility], but patient became tachypneic and tachycardic. BiPap was  resumed and patient was given precedex.   Objective   Blood pressure 102/70, pulse 69, temperature 97.8 F (36.6 C), temperature source Oral, resp. rate 19, height 5\' 9"  (1.753 m), weight 98.2 kg, SpO2 100%.    Vent Mode: PCV;BIPAP FiO2 (%):  [50 %-60 %] 50 % Set Rate:  [15 bmp] 15 bmp PEEP:  [10 cmH20-12 cmH20] 10 cmH20 Pressure Support:  [15 cmH20] 15 cmH20   Intake/Output Summary (Last 24 hours) at 11/12/2022 0758 Last data filed at 11/12/2022 0600 Gross per 24 hour  Intake 1231.49 ml  Output --  Net 1231.49 ml   Filed Weights   11/10/22 1334 11/11/22 0609 11/12/22 0343  Weight: 101.2 kg 93.3 kg 98.2 kg    Examination: General: chronically ill appearing male, uncomfortable appearing, acute respiratory distress  HENT: mmm, no JVD  Lungs: resps diminished throughout with few rales, tachypnea, increased work of breathing Cardiovascular: s1s2 rrr Abdomen: soft, mildly distended, non tender  Extremities: warm and dry, 3+ pitting edema  Neuro: awake, disoriented, mumbling at times, MAE   Resolved Hospital Problem list    Assessment & Plan:  Acute respiratory failure w/ hypoxia Parainfluenza with superimposed Acinetobacter baumannii multifocal pneumonia +/- Pulmonary edema COPD OSA on cpap Patient requiring Bipap for work of breathing. Weaned to 40%. Respiratory status improving, but failed transition to [facility]. Note DNR/DNI  Plan: -titrate O2 as able to keep sats 88-92% - Unasyn and tobramycin per pharmacy  - pulm toiletry - guafenesin - HD for volume removal, if blood pressure will tolerate   Hypotension - could be due to ESRD and/or septic shock s/t acinetobacter bacteremia  Lactic Acidosis BP less tenuous  overnight. Patient on levo 6. On peripheral pressors, cannot give more than 10 of levo.  Plan:  - Continue levo as needed to maintain MAP > 65. Titrate if possible.   Afib Patient with afib with RVR max HR of 169 when trialed off of Bipap and had increased work of  breathing. New onset.  - Waiting for RFP results  - EKG  - Monitor telemetry and give precedex for discomfort   New diagnosis of ESRD on HD  Patient with AKI secondary to rhabdo at previous admission, worsened to ESRD. HD yesterday and required pressors, continues to be very edematous and requiring oxygen. Incomplete dialysis session yesterday, BP did not tolerate.  Plan: - nephro consult for HD - Continue pressors as needed to continue dialysis  -Trend BMP / urinary output - Replace electrolytes as indicated - Avoid nephrotoxic agents, ensure adequate renal perfusion  Anemia  Patient with chronic anemia with exacerbation at last admission in June secondary to large hematoma requiring transfusions. Was receiving IV Fe with HD.  Plan:  - Hold IV Fe given bacteremia  - Nephro to give EPO weekly  - Monitor CBC   Schizoaffective disorder, bipolar type Depression with anxiety Plan: - holding home meds   Hypothyroidism Plan: -resume synthroid when able to take PO   Tobacco abuse Plan: -nicotine patch  BPH Plan: -doxazosin when able to take po  Will attempt to reach family for further discussion/clarification goals of care. Note DNR/DNI.   Best Practice (right click and "Reselect all SmartList Selections" daily)   Diet/type: NPO DVT prophylaxis: prophylactic heparin  GI prophylaxis: PPI Lines: N/A Foley:  N/A Code Status:  DNR Last date of multidisciplinary goals of care discussion [will update family daily]  Labs   CBC: Recent Labs  Lab 11/10/22 1336 11/10/22 1348 11/10/22 1730 11/10/22 2135 11/11/22 0102  WBC 4.1  --  2.3*  --  4.2  NEUTROABS 2.2  --   --   --   --   HGB 8.0* 8.8* 7.9* 8.2* 8.0*  HCT 25.8* 26.0* 24.5* 24.0* 25.4*  MCV 98.9  --  99.6  --  96.9  PLT 255  --  214  --  221    Basic Metabolic Panel: Recent Labs  Lab 11/10/22 1336 11/10/22 1348 11/10/22 1551 11/10/22 1730 11/10/22 2135 11/11/22 0102  NA 141 139  --   --  138 136  K  3.9 4.0  --   --  3.9 3.7  CL 100 101  --   --   --  97*  CO2 23  --   --   --   --  21*  GLUCOSE 83 78  --   --   --  96  BUN 35* 33*  --   --   --  37*  CREATININE 3.81* 3.90* 3.84*  --   --  3.58*  CALCIUM 8.4*  --   --   --   --  8.2*  MG  --   --   --   --   --  1.3*  PHOS  --   --   --  5.1*  --  5.0*   GFR: Estimated Creatinine Clearance: 22.8 mL/min (A) (by C-G formula based on SCr of 3.58 mg/dL (H)). Recent Labs  Lab 11/10/22 1336 11/10/22 1345 11/10/22 1551 11/10/22 1730 11/11/22 0102  PROCALCITON  --   --  12.94  --   --   WBC 4.1  --   --  2.3* 4.2  LATICACIDVEN  --  4.8*  --  6.0* 5.2*    Liver Function Tests: Recent Labs  Lab 11/10/22 1336  AST 31  ALT 19  ALKPHOS 53  BILITOT 1.1  PROT 5.0*  ALBUMIN 2.0*   No results for input(s): "LIPASE", "AMYLASE" in the last 168 hours. No results for input(s): "AMMONIA" in the last 168 hours.  ABG    Component Value Date/Time   PHART 7.3 (L) 10/18/2022 1500   PCO2ART 29 (L) 10/18/2022 1500   PO2ART 73 (L) 10/18/2022 1500   HCO3 25.4 11/11/2022 0102   TCO2 28 11/10/2022 2135   ACIDBASEDEF 5.9 (H) 10/19/2022 0211   O2SAT 50.5 11/11/2022 0102     Coagulation Profile: No results for input(s): "INR", "PROTIME" in the last 168 hours.  Cardiac Enzymes: No results for input(s): "CKTOTAL", "CKMB", "CKMBINDEX", "TROPONINI" in the last 168 hours.  HbA1C: Hgb A1c MFr Bld  Date/Time Value Ref Range Status  10/17/2022 07:25 AM 5.7 (H) 4.8 - 5.6 % Final    Comment:    (NOTE)         Prediabetes: 5.7 - 6.4         Diabetes: >6.4         Glycemic control for adults with diabetes: <7.0     CBG: Recent Labs  Lab 11/11/22 1509 11/11/22 1946 11/11/22 2340 11/12/22 0339 11/12/22 0711  GLUCAP 76 77 88 91 85    Review of Systems:   Difficult to understand patient, disoriented and delirious   Past Medical History:  He,  has a past medical history of COPD (chronic obstructive pulmonary disease) (HCC),  Schizoaffective disorder, bipolar type (HCC), Sleep apnea, Tardive dyskinesia, and Thyroid disease.   Surgical History:   Past Surgical History:  Procedure Laterality Date   APPENDECTOMY     DIALYSIS/PERMA CATHETER INSERTION N/A 10/27/2022   Procedure: DIALYSIS/PERMA CATHETER INSERTION;  Surgeon: Annice Needy, MD;  Location: [facility] INVASIVE CV LAB;  Service: Cardiovascular;  Laterality: N/A;     Social History:   reports that he has been smoking cigarettes. He has never used smokeless tobacco. He reports that he does not currently use alcohol. He reports that he does not currently use drugs.   Family History:  His family history is not on file.   Allergies Allergies  Allergen Reactions   Haldol [Haloperidol Lactate] Other (See Comments)    Tardive diskonesia   Other     [employer] cause patient to vomit   Thorazine [Chlorpromazine] Other (See Comments)    "messes me all up"   Trazodone And Nefazodone Other (See Comments)    "makes me feel like I'm dying."     Home Medications  Prior to Admission medications   Medication Sig Start Date End Date Taking? Authorizing Provider  acetaminophen (TYLENOL) 325 MG tablet Take 650 mg by mouth every 4 (four) hours as needed.    [provider]  albuterol (VENTOLIN HFA) 108 (90 Base) MCG/ACT inhaler Inhale 2 puffs into the lungs every 4 (four) hours as needed for wheezing or shortness of breath. 01/23/20   Shaune Pollack, MD  alum & mag hydroxide-simeth (MAALOX/MYLANTA) 200-200-20 MG/5ML suspension Take 30 mLs by mouth daily as needed for indigestion or heartburn.    [provider]  benztropine (COGENTIN) 2 MG tablet Take 2 mg by mouth 2 (two) times daily.    [provider]  bismuth subsalicylate (PEPTO BISMOL) 262 MG/15ML suspension Take 15 mLs by mouth every 4 (  four) hours as needed.    [provider]  busPIRone (BUSPAR) 15 MG tablet Take 15 mg by mouth 2 (two) times daily.    [provider]   cloZAPine (CLOZARIL) 100 MG tablet Take 100-200 mg by mouth 2 (two) times daily. 100 mg qam 200 mg qpm    [provider]  divalproex (DEPAKOTE ER) 500 MG 24 hr tablet Take 1,000 mg by mouth at bedtime.    [provider]  doxazosin (CARDURA) 4 MG tablet Take 4 mg by mouth daily.    [provider]  epoetin alfa (EPOGEN) 10000 UNIT/ML injection Inject 1 mL (10,000 Units total) into the vein every Monday, Wednesday, and Friday with hemodialysis. 11/05/22   Darlin Priestly, MD  guaiFENesin (ROBITUSSIN) 100 MG/5ML liquid Take 200 mg by mouth every 6 (six) hours as needed for cough.    [provider]  levothyroxine (SYNTHROID) 100 MCG tablet Take 75 mcg by mouth daily before breakfast.    [provider]  loperamide (IMODIUM) 2 MG capsule Take 2 mg by mouth as needed for diarrhea or loose stools.    [provider]  magnesium hydroxide (MILK OF MAGNESIA) 400 MG/5ML suspension Take 30 mLs by mouth daily as needed for mild constipation.    [provider]  meclizine (ANTIVERT) 25 MG tablet Take 25 mg by mouth 3 (three) times daily as needed for dizziness.    [provider]  multivitamin (RENA-VIT) TABS tablet Take 1 tablet by mouth at bedtime. 11/04/22   Darlin Priestly, MD  nicotine (NICODERM CQ - DOSED IN MG/24 HOURS) 21 mg/24hr patch Place 1 patch (21 mg total) onto the skin daily. 11/05/22   Darlin Priestly, MD  Nutritional Supplements (FEEDING SUPPLEMENT, NEPRO CARB STEADY,) LIQD Take 237 mLs by mouth 3 (three) times daily between meals. 11/04/22   Darlin Priestly, MD  pantoprazole (PROTONIX) 40 MG tablet Take 40 mg by mouth daily.    [provider]  sennosides-docusate sodium (SENOKOT-S) 8.6-50 MG tablet Take 1 tablet by mouth in the morning and at bedtime.    [provider]  traZODone (DESYREL) 50 MG tablet Take 50 mg by mouth at bedtime.    [provider]  valbenazine (INGREZZA) 80 MG capsule Take 80 mg by mouth daily.     [provider]  vitamin B-12 (CYANOCOBALAMIN) 100 MCG tablet Take 100 mcg by mouth daily.    [provider]  Vitamin D, Cholecalciferol, 25 MCG (1000 UT) TABS Take 50 mcg by mouth daily.     [provider]     Lockie Mola, MD  PGY-2 [facility]

## 2022-11-13 DIAGNOSIS — A419 Sepsis, unspecified organism: Secondary | ICD-10-CM | POA: Diagnosis not present

## 2022-11-13 DIAGNOSIS — F17210 Nicotine dependence, cigarettes, uncomplicated: Secondary | ICD-10-CM

## 2022-11-13 DIAGNOSIS — N179 Acute kidney failure, unspecified: Secondary | ICD-10-CM | POA: Diagnosis not present

## 2022-11-13 DIAGNOSIS — J9601 Acute respiratory failure with hypoxia: Secondary | ICD-10-CM | POA: Diagnosis not present

## 2022-11-13 DIAGNOSIS — J189 Pneumonia, unspecified organism: Secondary | ICD-10-CM | POA: Diagnosis not present

## 2022-11-13 DIAGNOSIS — R6521 Severe sepsis with septic shock: Secondary | ICD-10-CM | POA: Diagnosis not present

## 2022-11-13 LAB — GLUCOSE, CAPILLARY
Glucose-Capillary: 91 mg/dL (ref 70–99)
Glucose-Capillary: 109 mg/dL — ABNORMAL HIGH (ref 70–99)
Glucose-Capillary: 101 mg/dL — ABNORMAL HIGH (ref 70–99)
Glucose-Capillary: 96 mg/dL (ref 70–99)
Glucose-Capillary: 92 mg/dL (ref 70–99)
Glucose-Capillary: 100 mg/dL — ABNORMAL HIGH (ref 70–99)

## 2022-11-13 MED ORDER — MIDODRINE HCL 5 MG PO TABS: 10.0000 mg | ORAL_TABLET | Freq: Three times a day (TID) | ORAL | Status: DC

## 2022-11-13 MED ORDER — CHLORHEXIDINE GLUCONATE CLOTH 2 % EX PADS: 6.0000 | MEDICATED_PAD | Freq: Every day | CUTANEOUS | Status: DC

## 2022-11-13 MED ORDER — ORAL CARE MOUTH RINSE: 15.0000 mL | OROMUCOSAL | Status: DC | PRN

## 2022-11-13 MED ORDER — ORAL CARE MOUTH RINSE
15.0000 mL | OROMUCOSAL | Status: DC
  Administered 2022-11-13 – 2022-11-16 (×16): 15 mL via OROMUCOSAL

## 2022-11-13 NOTE — Progress Notes (Signed)
RN called RT to bedside to place the patient on BIPAP. Attempted to place the patient on and no matter how we adjusted the mask the patient still had a huge leak to the point the vent stopped multiple times. This is a normal function of the machine. However patient was on the NIV last night with the Large mask. We also tried a XL mask because patient has recessed chin and it would not fit any better. Pt is not in distress and is sat 100% on 5L HFNC. Will cont to monitor.

## 2022-11-13 NOTE — Progress Notes (Signed)
[facility] Progress Note   Subjective: seen in ICU, FiO2 0.40. Had HD last night.          Vitals:    11/13/22 0645 11/13/22 0700 11/13/22 0714 11/13/22 0800  BP: 111/70 112/68   (!) 150/57  Pulse: 75 73   71  Resp: (!) 28 (!) 23   19  Temp:     (!) 97.2 F (36.2 C)    TempSrc:     Axillary    SpO2: 95% 93%   100%  Weight:          Height:              Exam: Gen somnolent, bipap Neck no jvd Chest coarse rhonchi bilat RRR no RG Abd soft ntnd no mass or ascites +bs Ext diffuse 2-3+ pitting bilat LE edema up to hips Neuro as above, NF    RIJ TDC       Home meds- tylenol, ventolin, maalox, pepto bismol, buspar bid, clozapine bid, depakote er, cardura 4mg  every day, robitussin, synthroid, imodium, MOM, antivert, renavite, nicotine patch, nepro supps, protonix, senokot-s, desyrel, ingrezza, b-12, vit D      OP HD: [facility] MWF  3.5h   400/300  97.7kg   2/2.25 bath  RIJ TDC  Heparin 2000 - last and only OP HD was on 7/22, post wt 98.4kg - venofer 100mg  IV q HD thru 8/16 - no esa or vdra     Assessment/ Plan: Acute hypoxic resp failure - due to infection/ multifocal pna. On bipap w/ FiO2 down to 40%. Per CCM.  Parainfluenza infection / bact PNA - RVP + for parainfluenza and blood cx's + for acinetobacter baumannii. On IV Unasyn  Severe AKI / dialysis dependent - due to acute rhabdo, started HD [facility]/23/22 at [facility]. Pt is not esrd yet. Last OP HD was 7/22. Did not tolerate HD here on 7/24. Did get HD yesterday in ICU. Plan extra HD today for vol overload.  HTN/ volume - marked LE edema. Pressor requirements are decreasing. Will try HD today/ tonight w/ UF 2-2.5 L as tolerated.  Atrial fib - on IV diltiazem gtt Anemia esrd - Hb 8- 9 here, getting IV Fe in OP setting but will hold off due to infection. Was not on esa at OP unit -> started here w/ darbe 60 mcg sq weekly.  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 No vdra or binder listed. Follow.  Schizoaffective d/o - is on multiple  psychiatric meds  Hypothyroidism  DNI / DNR     Vinson Moselle MD  [facility] 11/13/2022, 8:49 AM   Last Labs        Recent Labs  Lab 11/10/22 1336 11/10/22 1348 11/11/22 0102 11/12/22 0902  HGB 8.0*   < > 8.0* 7.8*  ALBUMIN 2.0*  --   --  2.0*  CALCIUM 8.4*  --  8.2* 8.1*  PHOS  --    < > 5.0* 6.0*  CREATININE 3.81*   < > 3.58* 3.96*  K 3.9   < > 3.7 4.8   < > = values in this interval not displayed.      Last Labs  No results for input(s): "IRON", "TIBC", "FERRITIN" in the last 168 hours.   Inpatient medications:  Chlorhexidine Gluconate Cloth  6 each Topical Daily   darbepoetin (ARANESP) injection - DIALYSIS  60 mcg Subcutaneous Q Wed-1800   heparin  5,000 Units Subcutaneous Q8H   midodrine  10 mg Oral  TID WC   nicotine  14 mg Transdermal Daily   mouth rinse  15 mL Mouth Rinse 4 times per day         ampicillin-sulbactam (UNASYN) IV Stopped (11/12/22 2324)   dexmedetomidine (PRECEDEX) IV infusion 0.2 mcg/kg/hr (11/13/22 0700)   dextrose 5 % and 0.9 % NaCl Stopped (11/12/22 0824)   diltiazem (CARDIZEM) infusion Stopped (11/13/22 2993)   norepinephrine (LEVOPHED) Adult infusion 2 mcg/min (11/13/22 0700)        docusate, heparin, ipratropium-albuterol, mouth rinse, polyethylene glycol

## 2022-11-13 NOTE — Progress Notes (Signed)
Alert and nformed consent signed and in chart.   TX duration:3.5 hours  Patient tolerated well.  Alert, without acute distress.  Hand-off given to patient's nurse.   Access used: catheter Access issues: none  Total UF removed: 500 mls Medication(s) given: none Post HD VS: 109/85 Post HD weight: unable to obtain    11/13/22 0000  Post Treatment  Dialyzer Clearance Lightly streaked  Duration of HD Treatment -hour(s) 3.5 hour(s)  Hemodialysis Intake (mL) 200 mL  Liters Processed 70  Fluid Removed (mL) 500 mL  Tolerated HD Treatment Yes  Post-Hemodialysis Comments HD tx comoleted as scheduled, tolerated well  AVG/AVF Arterial Site Held (minutes) 0 minutes  AVG/AVF Venous Site Held (minutes) 0 minutes  Hemodialysis Catheter Right Internal jugular Double lumen Permanent (Tunneled)  Placement Date/Time: 10/27/22 1539   Time Out: Correct patient;Correct site;Correct procedure  Maximum sterile barrier precautions: Hand hygiene;Cap;Mask;Sterile gown;Sterile gloves;Large sterile sheet  Site Prep: Chlorhexidine (preferred);Skin Prep C...  Site Condition No complications  Blue Lumen Status Flushed;Heparin locked;Dead end cap in place  Red Lumen Status Flushed;Heparin locked;Dead end cap in place  Purple Lumen Status N/A  Catheter fill solution Heparin 1000 units/ml  Catheter fill volume (Arterial) 1.7 cc  Catheter fill volume (Venous) 1.7  Dressing Type Transparent  Dressing Status Antimicrobial disc in place  Drainage Description None  Dressing Change Due 11/17/22  Post treatment catheter status Capped and Clamped

## 2022-11-13 NOTE — Progress Notes (Signed)
NAME:  Austin Marsh, MRN:  [id number], DOB:  [birth year], LOS: 3 ADMISSION DATE:  11/10/2022, CONSULTATION DATE:  7/24 REFERRING MD:  Dr. Dalene Seltzer, CHIEF COMPLAINT:  hypoxic respiratory failure; hypotension   History of Present Illness:  Patient is a 67 yo M w/ pertinent Pmh COPD, ESRD recently started on HD, schizoaffective w/ bipolar disorder, depression/anxiety, hypothyroidism, tobacco abuse, OSA on cpap present to [facility] ED 7/24 w/ hypoxic respiratory failure.  Of note patient recently admitted to [facility] 6/30 for lethargy. Found to have acute urinary retention and severe AKI on CKD 3a requiring dialysis. During hospital course, patient had hypovolemic shock and anemia from left neck hematoma. BP improved w/ PRBCs and fluids. Started on midodrine. Patient had RLL PNA and completed course of abx. Had rhabdo which improved w/ fluid resuscitation. Discharged on 7/18.   On 7/24 patient after dialysis having sob and hypoxia. Sats in 1s. Increased Dotsero to 4L. EMS called and placed on NRB and transferred to [facility] ED. On ED arrival sats 97% on NRB. BP 106/60. Patient placed on bipap. RR 30s. Afebrile. BNP 1,071, LA 4.8,  and trop 173. CXR w/ opacities b/l concerning for mulilobar pneumonia. Cultures obtained and started on cefepime/vanc. Covid/flu pending. PCCM consulted.   Pertinent ED Labs: wbc, 4.1, hgb 8, AG 18,   Pertinent  Medical History  COPD ESRD recently started on HD schizoaffective w/ bipolar disorder depression/anxiety Hypothyroidism tobacco abuse OSA on cpap Significant Hospital Events: Including procedures, antibiotic start and stop dates in addition to other pertinent events   7/24 admitted to [facility] w/ resp failure w/ hypoxia on bipap, started on unasyn and tobramycin for acinetobacter baumannii PNA and bacteremia.  7/26 weaned off of precedex overnight.  Unable to wean off BiPAP   Interim History / Subjective:   Remains critically ill, on low-dose Levophed and Precedex Required  BiPAP overnight, now on high flow nasal cannula. Started on Cardizem overnight for A-fib RVR  Objective   Blood pressure (!) 150/57, pulse 71, temperature (!) 97.2 F (36.2 C), temperature source Axillary, resp. rate 19, height 5\' 9"  (1.753 m), weight 98.2 kg, SpO2 100%.    Vent Mode: PCV;BIPAP FiO2 (%):  [40 %] 40 % Set Rate:  [15 bmp] 15 bmp PEEP:  [10 cmH20] 10 cmH20 Pressure Support:  [15 cmH20] 15 cmH20   Intake/Output Summary (Last 24 hours) at 11/13/2022 1013 Last data filed at 11/13/2022 0700 Gross per 24 hour  Intake 566.23 ml  Output 825 ml  Net -258.77 ml   Filed Weights   11/11/22 0609 11/12/22 0343 11/13/22 0433  Weight: 93.3 kg 98.2 kg 98.2 kg    Examination: General: chronically ill appearing male, uncomfortable appearing,no distress  HENT: mmm, no JVD  Lungs: Decreased bilateral, no accessory muscle use Cardiovascular: s1s2 rrr Abdomen: soft, mildly distended, non tender  Extremities: warm and dry, 3+ pitting edema  Neuro: Lethargic, does not follow commands, RASS -2  Labs 7/26 show leukocytosis, stable anemia, normal electrolytes  Resolved Hospital Problem list    Assessment & Plan:  Acute respiratory failure w/ hypoxia Parainfluenza with superimposed Acinetobacter baumannii multifocal pneumonia +/- Pulmonary edema COPD OSA on cpap Patient requiring Bipap for work of breathing.  Respiratory status improving Note DNR/DNI  Plan: -Transition to high flow nasal cannula ,titrate O2 as able to keep sats 88-92% - pulm toiletry - guafenesin - HD for volume removal, if blood pressure will tolerate   Septic shock s/t acinetobacter bacteremia  Lactic Acidosis  Plan:  - Continue  levo as needed to maintain MAP > 65. Titrate if possible.  - Unasyn and tobramycin per pharmacy  -ID consult, unclear whether permacath needs to come out, will try to treat through  New onset atrial fibrillation/RVR Patient with afib with RVR max HR of 169 when trialed off of  Bipap and had increased work of breathing. New onset.  -Cardizem drip for now, can transition to oral at some point  New diagnosis of ESRD on HD  Patient with AKI secondary to rhabdo at previous admission, worsened to ESRD. HD yesterday and required pressors, continues to be very edematous and requiring oxygen. Incomplete dialysis session yesterday, BP did not tolerate.  Plan: - nephro consult for HD - Continue pressors as needed to continue dialysis , midodrine added -Trend BMP / urinary output - Replace electrolytes as indicated - Avoid nephrotoxic agents, ensure adequate renal perfusion  Anemia  Patient with chronic anemia with exacerbation at last admission in June secondary to large hematoma requiring transfusions. Was receiving IV Fe with HD.  Plan:  - Hold IV Fe given bacteremia  - Nephro to give EPO weekly  - Monitor CBC   Schizoaffective disorder, bipolar type Depression with anxiety Plan: -Resume home meds when able to take oral   Hypothyroidism Plan: -resume synthroid when able to take PO   Tobacco abuse Plan: -nicotine patch  BPH Plan: -doxazosin when able to take po  Will reach family today.  Need further discussion/clarification goals of care. Note DNR/DNI.   Best Practice (right click and "Reselect all SmartList Selections" daily)   Diet/type: NPO DVT prophylaxis: prophylactic heparin  GI prophylaxis: PPI Lines: N/A Foley:  N/A Code Status:  DNR Last date of multidisciplinary goals of care discussion -]  Labs   CBC: Recent Labs  Lab 11/10/22 1336 11/10/22 1348 11/10/22 1730 11/10/22 2135 11/11/22 0102 11/12/22 0902  WBC 4.1  --  2.3*  --  4.2 24.1*  NEUTROABS 2.2  --   --   --   --   --   HGB 8.0* 8.8* 7.9* 8.2* 8.0* 7.8*  HCT 25.8* 26.0* 24.5* 24.0* 25.4* 23.9*  MCV 98.9  --  99.6  --  96.9 97.2  PLT 255  --  214  --  221 185    Basic Metabolic Panel: Recent Labs  Lab 11/10/22 1336 11/10/22 1348 11/10/22 1551 11/10/22 1730  11/10/22 2135 11/11/22 0102 11/12/22 0902 11/12/22 1144  NA 141 139  --   --  138 136 139  --   K 3.9 4.0  --   --  3.9 3.7 4.8  --   CL 100 101  --   --   --  97* 99  --   CO2 23  --   --   --   --  21* 21*  --   GLUCOSE 83 78  --   --   --  96 96  --   BUN 35* 33*  --   --   --  37* 58*  --   CREATININE 3.81* 3.90* 3.84*  --   --  3.58* 3.96*  --   CALCIUM 8.4*  --   --   --   --  8.2* 8.1*  --   MG  --   --   --   --   --  1.3*  --  2.8*  PHOS  --   --   --  5.1*  --  5.0* 6.0*  --  GFR: Estimated Creatinine Clearance: 20.6 mL/min (A) (by C-G formula based on SCr of 3.96 mg/dL (H)). Recent Labs  Lab 11/10/22 1336 11/10/22 1345 11/10/22 1551 11/10/22 1730 11/11/22 0102 11/12/22 0902  PROCALCITON  --   --  12.94  --   --   --   WBC 4.1  --   --  2.3* 4.2 24.1*  LATICACIDVEN  --  4.8*  --  6.0* 5.2*  --     Liver Function Tests: Recent Labs  Lab 11/10/22 1336 11/12/22 0902  AST 31  --   ALT 19  --   ALKPHOS 53  --   BILITOT 1.1  --   PROT 5.0*  --   ALBUMIN 2.0* 2.0*   No results for input(s): "LIPASE", "AMYLASE" in the last 168 hours. No results for input(s): "AMMONIA" in the last 168 hours.  ABG    Component Value Date/Time   PHART 7.3 (L) 10/18/2022 1500   PCO2ART 29 (L) 10/18/2022 1500   PO2ART 73 (L) 10/18/2022 1500   HCO3 25.4 11/11/2022 0102   TCO2 28 11/10/2022 2135   ACIDBASEDEF 5.9 (H) 10/19/2022 0211   O2SAT 50.5 11/11/2022 0102     Coagulation Profile: No results for input(s): "INR", "PROTIME" in the last 168 hours.  Cardiac Enzymes: No results for input(s): "CKTOTAL", "CKMB", "CKMBINDEX", "TROPONINI" in the last 168 hours.  HbA1C: Hgb A1c MFr Bld  Date/Time Value Ref Range Status  10/17/2022 07:25 AM 5.7 (H) 4.8 - 5.6 % Final    Comment:    (NOTE)         Prediabetes: 5.7 - 6.4         Diabetes: >6.4         Glycemic control for adults with diabetes: <7.0     CBG: Recent Labs  Lab 11/12/22 1506 11/12/22 1903 11/12/22 2316  11/13/22 0316 11/13/22 0711  GLUCAP 75 85 88 91 109*    My independent critical care time was 32 minutes  Cyril Mourning MD. FCCP. [facility] Pager : [phone]  If no response to pager , please call [phone] until 7 pm After 7:00 pm call Elink  [phone]   11/13/2022

## 2022-11-13 NOTE — Plan of Care (Signed)
  Problem: Fluid Volume: Goal: Hemodynamic stability will improve Outcome: Progressing   Problem: Clinical Measurements: Goal: Diagnostic test results will improve Outcome: Progressing Goal: Signs and symptoms of infection will decrease Outcome: Progressing   Problem: Respiratory: Goal: Ability to maintain adequate ventilation will improve Outcome: Not Progressing

## 2022-11-13 NOTE — Consult Note (Addendum)
[facility]    Date of Admission:  11/10/2022     Reason for Consult: acinetobacter bsi    Referring Provider: Cyril Mourning     Lines:  Right chest tunneled hd line  Abx: 7/25-c amp/sulb  7/24 vanc/cefepime        Assessment: 67 yo male with hypothyroidism, copd, progressive ckd to esrd (dialysis started 10/23/22 via internal jugular hd line), bipolar, schizoaffective, recent [facility] admission for hypovolumic shock and ?blood loss related shock with course complicated by rhabdo leading to aki on ckd and required dialysis, discharged 7/18, readmitted 7/24 for hypoxic respiratory failure, course complicated by septic shock and afib-rvr   Xray shows bilateral pna, and respiratory pcr suggest parainfluenza Admission bcx also grew acinetobacter  7/24 respiratory viral pcr parainfluenza virus 7/24 bcx acinetobacter baumannii (S bactrim, cipro, amp-sulb)    Bacteremia source suspected to be hd catheter  Plan: Repeat blood cx If persistent shock or continued bacteremia would remove line (or lack of abx lock therapy) Otherwise could consider treating through with systemic and abx lock therapy if such is present If patient stays for the duration 10 days then can finish iv abx here otherwise oral minocycline (will check sensitivity) would potentially be an option Will review with ID pharmacy team on Monday regarding present of abx lock therapy for acinetobacter  Discussed with primary team     ------------------------------------------------ Principal Problem:   Acute respiratory failure with hypoxia (HCC) Active Problems:   Multifocal pneumonia    HPI: Austin Marsh is a 67 y.o. male hypothyroidism, copd, progressive ckd to esrd (dialysis started 10/23/22 via internal jugular hd line), bipolar, schizoaffective, recent [facility] admission for hypovolumic shock and ?blood loss related shock with course complicated by rhabdo leading to aki on ckd and  required dialysis, discharged 7/18, readmitted 7/24 for hypoxic respiratory failure, course complicated by septic shock and afib-rvr   He had no fever on presentation and wbc normal W/u showed parainfluenza virus pneumonitis and acinetobacter baumannii on initial bcx Abx narrowed to amp-sulb Patient initially needed bipap and was on pressors  His wbc did bump but clinically improved with dialysis and abx. Off bipap and pressors  He remains confused however and requires precedex  No diarrhea/rash per nursing staff  I can't make out what he is trying to say so ros limited      History reviewed. No pertinent family history.  Social History   Tobacco Use   Smoking status: Every Day    Types: Cigarettes   Smokeless tobacco: Never  Substance Use Topics   Alcohol use: Not Currently   Drug use: Not Currently    Allergies  Allergen Reactions   Haldol [Haloperidol Lactate] Other (See Comments)    Tardive diskonesia   Other     [employer] Beans cause patient to vomit   Thorazine [Chlorpromazine] Other (See Comments)    "messes me all up"   Trazodone And Nefazodone Other (See Comments)    "makes me feel like I'm dying."    Review of Systems: ROS All Other ROS was negative, except mentioned above   Past Medical History:  Diagnosis Date   COPD (chronic obstructive pulmonary disease) (HCC)    Schizoaffective disorder, bipolar type (HCC)    Sleep apnea    Tardive dyskinesia    Thyroid disease        Scheduled Meds:  Chlorhexidine Gluconate Cloth  6 each Topical Daily   [START ON 11/14/2022]  Chlorhexidine Gluconate Cloth  6 each Topical Q0600   darbepoetin (ARANESP) injection - DIALYSIS  60 mcg Subcutaneous Q Wed-1800   heparin  5,000 Units Subcutaneous Q8H   midodrine  10 mg Oral Q8H   nicotine  14 mg Transdermal Daily   mouth rinse  15 mL Mouth Rinse 4 times per day   Continuous Infusions:  ampicillin-sulbactam (UNASYN) IV Stopped (11/13/22 0941)   dexmedetomidine  (PRECEDEX) IV infusion 0.4 mcg/kg/hr (11/13/22 1200)   dextrose 5 % and 0.9 % NaCl Stopped (11/12/22 0824)   diltiazem (CARDIZEM) infusion Stopped (11/13/22 3244)   norepinephrine (LEVOPHED) Adult infusion Stopped (11/13/22 1024)   PRN Meds:.docusate, heparin, ipratropium-albuterol, mouth rinse, polyethylene glycol   OBJECTIVE: Blood pressure 134/80, pulse 79, temperature 97.7 F (36.5 C), temperature source Oral, resp. rate 19, height 5\' 9"  (1.753 m), weight 98.2 kg, SpO2 95%.  Physical Exam  General/constitutional: appears upset, trying to voice words; in mitt restraint HEENT: Normocephalic, PER, Conj Clear, EOMI, Oropharynx clear Neck supple CV: rrr no mrg Lungs: normal respiratory effort; on supplemental o2 via [place] Abd: Soft, Nontender Ext: 1+ bilateral LE edema Skin: No Rash Neuro: trying to speak to me; moving all ext without obvious deficit; interactive MSK: no peripheral joint swelling/tenderness/warmth; back spines nontender   Central line presence: right chest hd line exit site no tenderness, purulence, erythema   Lab Results Lab Results  Component Value Date   WBC 24.1 (H) 11/12/2022   HGB 7.8 (L) 11/12/2022   HCT 23.9 (L) 11/12/2022   MCV 97.2 11/12/2022   PLT 185 11/12/2022    Lab Results  Component Value Date   CREATININE 3.96 (H) 11/12/2022   BUN 58 (H) 11/12/2022   NA 139 11/12/2022   K 4.8 11/12/2022   CL 99 11/12/2022   CO2 21 (L) 11/12/2022    Lab Results  Component Value Date   ALT 19 11/10/2022   AST 31 11/10/2022   ALKPHOS 53 11/10/2022   BILITOT 1.1 11/10/2022      Microbiology: Recent Results (from the past 240 hour(s))  Blood culture (routine x 2)     Status: Abnormal (Preliminary result)   Collection Time: 11/10/22  2:16 PM   Specimen: BLOOD  Result Value Ref Range Status   Specimen Description BLOOD BLOOD LEFT HAND  Final   Special Requests   Final    BOTTLES DRAWN AEROBIC ONLY Blood Culture adequate volume   Culture  Setup  Time   Final    GRAM NEGATIVE RODS AEROBIC BOTTLE ONLY CRITICAL RESULT CALLED TO, READ BACK BY AND VERIFIED WITH: PHARMD G. ABBOTT 11/11/22 @ 0133 BY AB Performed at [facility], [address] [place], [place] [address]    Culture ACINETOBACTER BAUMANNII (A)  Final   Report Status PENDING  Incomplete   Organism ID, Bacteria ACINETOBACTER BAUMANNII  Final      Susceptibility   Acinetobacter baumannii - MIC*    CEFTAZIDIME 4 SENSITIVE Sensitive     CIPROFLOXACIN <=0.25 SENSITIVE Sensitive     GENTAMICIN <=1 SENSITIVE Sensitive     IMIPENEM <=0.25 SENSITIVE Sensitive     PIP/TAZO <=4 SENSITIVE Sensitive     TRIMETH/SULFA <=20 SENSITIVE Sensitive     AMPICILLIN/SULBACTAM <=2 SENSITIVE Sensitive     * ACINETOBACTER BAUMANNII  Blood Culture ID Panel (Reflexed)     Status: Abnormal   Collection Time: 11/10/22  2:16 PM  Result Value Ref Range Status   Enterococcus faecalis NOT DETECTED NOT DETECTED Final  Enterococcus Faecium NOT DETECTED NOT DETECTED Final   Listeria monocytogenes NOT DETECTED NOT DETECTED Final   Staphylococcus species NOT DETECTED NOT DETECTED Final   Staphylococcus aureus (BCID) NOT DETECTED NOT DETECTED Final   Staphylococcus epidermidis NOT DETECTED NOT DETECTED Final   Staphylococcus lugdunensis NOT DETECTED NOT DETECTED Final   Streptococcus species NOT DETECTED NOT DETECTED Final   Streptococcus agalactiae NOT DETECTED NOT DETECTED Final   Streptococcus pneumoniae NOT DETECTED NOT DETECTED Final   Streptococcus pyogenes NOT DETECTED NOT DETECTED Final   A.calcoaceticus-baumannii DETECTED (A) NOT DETECTED Final    Comment: CRITICAL RESULT CALLED TO, READ BACK BY AND VERIFIED WITH: PHARMD G. ABBOTT 11/11/22 @ 0133 BY AB    Bacteroides fragilis NOT DETECTED NOT DETECTED Final   Enterobacterales NOT DETECTED NOT DETECTED Final   Enterobacter cloacae complex NOT DETECTED NOT DETECTED Final   Escherichia coli NOT DETECTED NOT DETECTED Final   Klebsiella  aerogenes NOT DETECTED NOT DETECTED Final   Klebsiella oxytoca NOT DETECTED NOT DETECTED Final   Klebsiella pneumoniae NOT DETECTED NOT DETECTED Final   Proteus species NOT DETECTED NOT DETECTED Final   Salmonella species NOT DETECTED NOT DETECTED Final   Serratia marcescens NOT DETECTED NOT DETECTED Final   Haemophilus influenzae NOT DETECTED NOT DETECTED Final   Neisseria meningitidis NOT DETECTED NOT DETECTED Final   Pseudomonas aeruginosa NOT DETECTED NOT DETECTED Final   Stenotrophomonas maltophilia NOT DETECTED NOT DETECTED Final   Candida albicans NOT DETECTED NOT DETECTED Final   Candida auris NOT DETECTED NOT DETECTED Final   Candida glabrata NOT DETECTED NOT DETECTED Final   Candida krusei NOT DETECTED NOT DETECTED Final   Candida parapsilosis NOT DETECTED NOT DETECTED Final   Candida tropicalis NOT DETECTED NOT DETECTED Final   Cryptococcus neoformans/gattii NOT DETECTED NOT DETECTED Final   CTX-M ESBL NOT DETECTED NOT DETECTED Final   Carbapenem resistance IMP NOT DETECTED NOT DETECTED Final   Carbapenem resistance KPC NOT DETECTED NOT DETECTED Final   Carbapenem resistance NDM NOT DETECTED NOT DETECTED Final   Carbapenem resistance VIM NOT DETECTED NOT DETECTED Final    Comment: Performed at [facility], [address], [place], [place] [address]  Blood culture (routine x 2)     Status: Abnormal (Preliminary result)   Collection Time: 11/10/22  2:25 PM   Specimen: BLOOD RIGHT HAND  Result Value Ref Range Status   Specimen Description BLOOD RIGHT HAND  Final   Special Requests   Final    BOTTLES DRAWN AEROBIC AND ANAEROBIC Blood Culture adequate volume   Culture  Setup Time   Final    GRAM NEGATIVE RODS IN BOTH AEROBIC AND ANAEROBIC BOTTLES CRITICAL VALUE NOTED.  VALUE IS CONSISTENT WITH PREVIOUSLY REPORTED AND CALLED VALUE.    Culture (A)  Final    ACINETOBACTER BAUMANNII SUSCEPTIBILITIES PERFORMED ON PREVIOUS CULTURE WITHIN THE LAST 5 DAYS. Performed at  [facility], [address], [place], [place] [address]    Report Status PENDING  Incomplete  SARS Coronavirus 2 by RT PCR (hospital order, performed in [facility] hospital lab) *cepheid single result test* Anterior Nasal Swab     Status: None   Collection Time: 11/10/22  3:51 PM   Specimen: Anterior Nasal Swab  Result Value Ref Range Status   SARS Coronavirus 2 by RT PCR NEGATIVE NEGATIVE Final    Comment: Performed at [facility], [address], [place], [place] [address]  Respiratory (~20 pathogens) panel by PCR  Status: Abnormal   Collection Time: 11/10/22  3:51 PM   Specimen: Nasopharyngeal Swab; Respiratory  Result Value Ref Range Status   Adenovirus NOT DETECTED NOT DETECTED Final   Coronavirus 229E NOT DETECTED NOT DETECTED Final    Comment: (NOTE) The Coronavirus on the Respiratory Panel, DOES NOT test for the novel  Coronavirus (2019 nCoV)    Coronavirus HKU1 NOT DETECTED NOT DETECTED Final   Coronavirus NL63 NOT DETECTED NOT DETECTED Final   Coronavirus OC43 NOT DETECTED NOT DETECTED Final   Metapneumovirus NOT DETECTED NOT DETECTED Final   Rhinovirus / Enterovirus NOT DETECTED NOT DETECTED Final   Influenza A NOT DETECTED NOT DETECTED Final   Influenza B NOT DETECTED NOT DETECTED Final   Parainfluenza Virus 1 NOT DETECTED NOT DETECTED Final   Parainfluenza Virus 2 NOT DETECTED NOT DETECTED Final   Parainfluenza Virus 3 DETECTED (A) NOT DETECTED Final   Parainfluenza Virus 4 NOT DETECTED NOT DETECTED Final   Respiratory Syncytial Virus NOT DETECTED NOT DETECTED Final   Bordetella pertussis NOT DETECTED NOT DETECTED Final   Bordetella Parapertussis NOT DETECTED NOT DETECTED Final   Chlamydophila pneumoniae NOT DETECTED NOT DETECTED Final   Mycoplasma pneumoniae NOT DETECTED NOT DETECTED Final    Comment: Performed at [facility], [address] [place], [place] [address]  MRSA Next Gen by PCR, Nasal     Status: None   Collection Time: 11/10/22   3:59 PM   Specimen: Nasal Mucosa; Nasal Swab  Result Value Ref Range Status   MRSA by PCR Next Gen NOT DETECTED NOT DETECTED Final    Comment: (NOTE) The GeneXpert MRSA Assay (FDA approved for NASAL specimens only), is one component of a comprehensive MRSA colonization surveillance program. It is not intended to diagnose MRSA infection nor to guide or monitor treatment for MRSA infections. Test performance is not FDA approved in patients less than 32 years old. Performed at [facility], [address], [place], [place] [address]      Serology:    Imaging: If present, new imagings (plain films, ct scans, and mri) have been personally visualized and interpreted; radiology reports have been reviewed. Decision making incorporated into the Impression / Recommendations.  7/24 cxr *New heterogeneous opacities overlying the left lung and right paracardiac region, compatible with multilobar pneumonia.   Follow-up to clearing is recommended.  Raymondo Band, MD [facility] [phone] pager    11/13/2022, 3:06 PM

## 2022-11-14 ENCOUNTER — Inpatient Hospital Stay (HOSPITAL_COMMUNITY): Payer: Medicare Other

## 2022-11-14 DIAGNOSIS — N186 End stage renal disease: Secondary | ICD-10-CM

## 2022-11-14 DIAGNOSIS — A419 Sepsis, unspecified organism: Secondary | ICD-10-CM | POA: Diagnosis not present

## 2022-11-14 DIAGNOSIS — J9601 Acute respiratory failure with hypoxia: Secondary | ICD-10-CM | POA: Diagnosis not present

## 2022-11-14 DIAGNOSIS — Z992 Dependence on renal dialysis: Secondary | ICD-10-CM

## 2022-11-14 DIAGNOSIS — R6521 Severe sepsis with septic shock: Secondary | ICD-10-CM | POA: Diagnosis not present

## 2022-11-14 LAB — BASIC METABOLIC PANEL WITH GFR
Anion gap: 18 — ABNORMAL HIGH (ref 5–15)
BUN: 59 mg/dL — ABNORMAL HIGH (ref 8–23)
CO2: 24 mmol/L (ref 22–32)
Calcium: 8.5 mg/dL — ABNORMAL LOW (ref 8.9–10.3)
Chloride: 97 mmol/L — ABNORMAL LOW (ref 98–111)
Creatinine, Ser: 3.17 mg/dL — ABNORMAL HIGH (ref 0.61–1.24)
GFR, Estimated: 21 mL/min — ABNORMAL LOW (ref >60)
Glucose, Bld: 102 mg/dL — ABNORMAL HIGH (ref 70–99)
Potassium: 3.9 mmol/L (ref 3.5–5.1)
Sodium: 139 mmol/L (ref 135–145)

## 2022-11-14 LAB — CBC WITH DIFFERENTIAL/PLATELET
Abs Immature Granulocytes: 0 10*3/uL (ref 0.00–0.07)
Basophils Absolute: 0 10*3/uL (ref 0.0–0.1)
Basophils Relative: 0 %
Eosinophils Absolute: 0 10*3/uL (ref 0.0–0.5)
Eosinophils Relative: 0 %
HCT: 24 % — ABNORMAL LOW (ref 39.0–52.0)
Hemoglobin: 7.9 g/dL — ABNORMAL LOW (ref 13.0–17.0)
Lymphocytes Relative: 3 %
Lymphs Abs: 1.3 10*3/uL (ref 0.7–4.0)
MCH: 30.5 pg (ref 26.0–34.0)
MCHC: 32.9 g/dL (ref 30.0–36.0)
MCV: 92.7 fL (ref 80.0–100.0)
Monocytes Absolute: 1.7 10*3/uL — ABNORMAL HIGH (ref 0.1–1.0)
Monocytes Relative: 4 %
Neutro Abs: 39.7 10*3/uL — ABNORMAL HIGH (ref 1.7–7.7)
Neutrophils Relative %: 93 %
Platelets: 151 10*3/uL (ref 150–400)
RBC: 2.59 MIL/uL — ABNORMAL LOW (ref 4.22–5.81)
RDW: 17.4 % — ABNORMAL HIGH (ref 11.5–15.5)
WBC: 42.7 10*3/uL — ABNORMAL HIGH (ref 4.0–10.5)
nRBC: 0 /100{WBCs}
nRBC: 0.1 % (ref 0.0–0.2)

## 2022-11-14 LAB — GLUCOSE, CAPILLARY
Glucose-Capillary: 94 mg/dL (ref 70–99)
Glucose-Capillary: 97 mg/dL (ref 70–99)
Glucose-Capillary: 83 mg/dL (ref 70–99)
Glucose-Capillary: 88 mg/dL (ref 70–99)
Glucose-Capillary: 85 mg/dL (ref 70–99)
Glucose-Capillary: 83 mg/dL (ref 70–99)

## 2022-11-14 MED ORDER — OXYCODONE HCL 5 MG/5ML PO SOLN
5.0000 mg | Freq: Four times a day (QID) | ORAL | Status: DC | PRN
  Administered 2022-11-14 – 2022-11-16 (×7): 5 mg
  Filled 2022-11-14 (×7): qty 5

## 2022-11-14 MED ORDER — MINOCYCLINE HCL 50 MG PO CAPS
200.0000 mg | ORAL_CAPSULE | Freq: Two times a day (BID) | ORAL | Status: DC
  Administered 2022-11-14 – 2022-11-15 (×2): 200 mg
  Filled 2022-11-14 (×3): qty 4

## 2022-11-14 MED ORDER — MIDODRINE HCL 5 MG PO TABS
10.0000 mg | ORAL_TABLET | Freq: Three times a day (TID) | ORAL | Status: DC
  Administered 2022-11-14 – 2022-11-16 (×7): 10 mg
  Filled 2022-11-14 (×7): qty 2

## 2022-11-14 MED ORDER — VALBENAZINE TOSYLATE 40 MG PO CAPS
80.0000 mg | ORAL_CAPSULE | Freq: Every day | ORAL | Status: DC
  Administered 2022-11-14 – 2022-11-16 (×3): 80 mg via ORAL
  Filled 2022-11-14 (×4): qty 2

## 2022-11-14 MED ORDER — CLOZAPINE 100 MG PO TABS: 100.0000 mg | ORAL_TABLET | Freq: Every day | ORAL | Status: DC

## 2022-11-14 MED ORDER — HEPARIN SODIUM (PORCINE) 1000 UNIT/ML IJ SOLN
INTRAMUSCULAR | Status: AC
  Filled 2022-11-14: qty 2

## 2022-11-14 MED ORDER — CHLORHEXIDINE GLUCONATE CLOTH 2 % EX PADS
6.0000 | MEDICATED_PAD | Freq: Every day | CUTANEOUS | Status: DC
  Administered 2022-11-14 – 2022-11-16 (×2): 6 via TOPICAL

## 2022-11-14 MED ORDER — BUSPIRONE HCL 15 MG PO TABS
15.0000 mg | ORAL_TABLET | Freq: Two times a day (BID) | ORAL | Status: DC
  Administered 2022-11-14: 15 mg via ORAL
  Filled 2022-11-14: qty 1

## 2022-11-14 MED ORDER — VALPROATE SODIUM 250 MG/5ML PO SOLN
500.0000 mg | Freq: Two times a day (BID) | ORAL | Status: DC
  Administered 2022-11-14 – 2022-11-16 (×5): 500 mg
  Filled 2022-11-14 (×5): qty 10

## 2022-11-14 MED ORDER — BENZTROPINE MESYLATE 2 MG PO TABS
2.0000 mg | ORAL_TABLET | Freq: Two times a day (BID) | ORAL | Status: DC
  Administered 2022-11-14: 2 mg via ORAL
  Filled 2022-11-14 (×2): qty 1

## 2022-11-14 MED ORDER — CLOZAPINE 100 MG PO TABS
200.0000 mg | ORAL_TABLET | Freq: Every day | ORAL | Status: DC
  Filled 2022-11-14: qty 2

## 2022-11-14 MED ORDER — CHLORHEXIDINE GLUCONATE CLOTH 2 % EX PADS: 6.0000 | MEDICATED_PAD | CUTANEOUS | Status: DC

## 2022-11-14 MED ORDER — CLOZAPINE 100 MG PO TABS
100.0000 mg | ORAL_TABLET | Freq: Every day | ORAL | Status: DC
  Administered 2022-11-15 – 2022-11-16 (×2): 100 mg
  Filled 2022-11-14 (×2): qty 1

## 2022-11-14 MED ORDER — CLOZAPINE 100 MG PO TABS
200.0000 mg | ORAL_TABLET | Freq: Every day | ORAL | Status: DC
  Administered 2022-11-14: 200 mg
  Filled 2022-11-14 (×2): qty 2

## 2022-11-14 MED ORDER — DIVALPROEX SODIUM ER 500 MG PO TB24
1000.0000 mg | ORAL_TABLET | Freq: Every day | ORAL | Status: DC
  Filled 2022-11-14: qty 2

## 2022-11-14 MED ORDER — OLANZAPINE 10 MG IM SOLR
5.0000 mg | Freq: Four times a day (QID) | INTRAMUSCULAR | Status: DC | PRN
  Administered 2022-11-15 (×2): 5 mg via INTRAVENOUS
  Filled 2022-11-14 (×3): qty 10

## 2022-11-14 MED ORDER — BUSPIRONE HCL 15 MG PO TABS
15.0000 mg | ORAL_TABLET | Freq: Two times a day (BID) | ORAL | Status: DC
  Administered 2022-11-15 – 2022-11-16 (×4): 15 mg
  Filled 2022-11-14 (×4): qty 1

## 2022-11-14 MED ORDER — BENZTROPINE MESYLATE 2 MG PO TABS
2.0000 mg | ORAL_TABLET | Freq: Two times a day (BID) | ORAL | Status: DC
  Administered 2022-11-15 – 2022-11-16 (×3): 2 mg
  Filled 2022-11-14 (×5): qty 1

## 2022-11-14 MED ORDER — MINOCYCLINE HCL 50 MG PO CAPS
200.0000 mg | ORAL_CAPSULE | Freq: Two times a day (BID) | ORAL | Status: DC
  Administered 2022-11-14: 200 mg via ORAL
  Filled 2022-11-14: qty 2
  Filled 2022-11-14: qty 4
  Filled 2022-11-14: qty 2

## 2022-11-14 NOTE — Evaluation (Signed)
Clinical/Bedside Swallow Evaluation Patient Details  Name: Austin Marsh MRN: [id number] Date of Birth: 04-17-1956  Today's Date: 11/14/2022 Time: SLP Start Time (ACUTE ONLY): 1450 SLP Stop Time (ACUTE ONLY): 1505 SLP Time Calculation (min) (ACUTE ONLY): 15 min  Past Medical History:  Past Medical History:  Diagnosis Date   COPD (chronic obstructive pulmonary disease) (HCC)    Schizoaffective disorder, bipolar type (HCC)    Sleep apnea    Tardive dyskinesia    Thyroid disease    Past Surgical History:  Past Surgical History:  Procedure Laterality Date   APPENDECTOMY     DIALYSIS/PERMA CATHETER INSERTION N/A 10/27/2022   Procedure: DIALYSIS/PERMA CATHETER INSERTION;  Surgeon: Annice Needy, MD;  Location: [facility] INVASIVE CV LAB;  Service: Cardiovascular;  Laterality: N/A;   HPI:  Patient is a 67 y.o. male with PMH: COPD, ESRD recently started on HD, schizoaffective with bipolar disorder, depression/anxiety,, hypothyroidism,, tobacco abuse, OSA on CPAP. He presented to [facility] ED on 11/10/22 with hypoxic respiratory failure. He was recently admitted 10/17/22 to [facility] for lethargy and found to have acute urinary retention and severe AKI on CKD 3a requiring dialysis. EMS put patient on NRB secondary to sats at 70's%. Patient was placed on BiPAP in ED, RR in 30's.  CXR w/ opacities b/l concerning for mulilobar pneumonia. He has had agitation in spite of Precedex, requiring restraints. He has been NPO.    Assessment / Plan / Recommendation  Clinical Impression  Patient currently presenting with cognitive-based dysphagia as per this bedside swallow evaluation. PO's administered limited to spoon sip of water secondary to patient refusing all other PO's. With spoon sip of water, patient did not make any effort to keep bolus contained in oral cavity and majority of it spilled out of mouth onto his hospital gown. Patient refused to try to drink through straw, even after he had nodded his head when asked if he  would like to try a soda. SLP encouraged patient to attempt to swallow and patient asked, "Why?" SLP recommending continue NPO and will follow for PO readiness however his willingness to participate is poor at this time. SLP Visit Diagnosis: Dysphagia, unspecified (R13.10)    Aspiration Risk  Severe aspiration risk;Risk for inadequate nutrition/hydration    Diet Recommendation NPO    Medication Administration: Via alternative means    Other  Recommendations      Recommendations for follow up therapy are one component of a multi-disciplinary discharge planning process, led by the attending physician.  Recommendations may be updated based on patient status, additional functional criteria and insurance authorization.  Follow up Recommendations Follow physician's recommendations for discharge plan and follow up therapies      Assistance Recommended at Discharge    Functional Status Assessment Patient has had a recent decline in their functional status and demonstrates the ability to make significant improvements in function in a reasonable and predictable amount of time.  Frequency and Duration min 2x/week  2 weeks       Prognosis Prognosis for improved oropharyngeal function: Guarded Barriers to Reach Goals: Motivation;Behavior;Severity of deficits;Cognitive deficits      Swallow Study   General Date of Onset: 11/14/22 HPI: Patient is a 67 y.o. male with PMH: COPD, ESRD recently started on HD, schizoaffective with bipolar disorder, depression/anxiety,, hypothyroidism,, tobacco abuse, OSA on CPAP. He presented to [facility] ED on 11/10/22 with hypoxic respiratory failure. He was recently admitted 10/17/22 to [facility] for lethargy and found to have acute urinary retention and severe AKI on  CKD 3a requiring dialysis. EMS put patient on NRB secondary to sats at 70's%. Patient was placed on BiPAP in ED, RR in 30's.  CXR w/ opacities b/l concerning for mulilobar pneumonia. He has had agitation in spite of  Precedex, requiring restraints. He has been NPO. Type of Study: Bedside Swallow Evaluation Previous Swallow Assessment: during previous hospitalization, BSE Diet Prior to this Study: NPO Temperature Spikes Noted: No Respiratory Status: Nasal cannula History of Recent Intubation: No Behavior/Cognition: Alert;Uncooperative;Requires cueing;Doesn't follow directions Oral Cavity Assessment: Dry Oral Care Completed by SLP: Recent completion by staff Oral Cavity - Dentition: Edentulous Self-Feeding Abilities: Refused PO;Total assist Patient Positioning: Upright in bed Baseline Vocal Quality: Low vocal intensity Volitional Cough: Cognitively unable to elicit Volitional Swallow: Unable to elicit    Oral/Motor/Sensory Function Overall Oral Motor/Sensory Function: Other (comment) (did not participate in oral motor exam)   Ice Chips     Thin Liquid Thin Liquid: Impaired Presentation: Spoon Oral Phase Impairments: Poor awareness of bolus;Other (comment) (patient made not attempt to contain spoon sip of water in mouth)    Nectar Thick     Honey Thick     Puree     Solid           Angela Nevin, MA, CCC-SLP Speech Therapy

## 2022-11-14 NOTE — Progress Notes (Signed)
Id brief update  Chart reviewed  Isolated wbc rise of unclear significance   -will add minocycline to amp-sulbactam -if continues rise in wbc or clinical decline, agree will need to remove hd line

## 2022-11-14 NOTE — Plan of Care (Signed)
  Problem: Clinical Measurements: Goal: Ability to maintain clinical measurements within normal limits will improve Outcome: Progressing Goal: Will remain free from infection Outcome: Not Progressing Goal: Diagnostic test results will improve Outcome: Not Progressing Goal: Respiratory complications will improve Outcome: Progressing Goal: Cardiovascular complication will be avoided Outcome: Progressing   Problem: Activity: Goal: Risk for activity intolerance will decrease Outcome: Not Progressing
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
## 2022-11-14 NOTE — Progress Notes (Signed)
eLink Physician-Brief Progress Note Patient Name: Austin Marsh DOB: [birth year] MRN: [id number]   Date of Service  11/14/2022  HPI/Events of Note  Patient with schizoaffective disorder.  Occasionally swinging at staff and pulling up medical equipment.  eICU Interventions  Soft wrist restraints ordered.     Intervention Category Minor Interventions: Other:  Carilyn Goodpasture 11/14/2022, 12:35 AM

## 2022-11-14 NOTE — Progress Notes (Addendum)
[facility] Progress Note   Subjective: seen in ICU, FiO2 0.40. Had HD last night again this time w/ 2.5 L off.  Remains off levo gtt. Off bipap today and is down to 4 L [place].SBP 100- 115         Vitals:    11/13/22 0645 11/13/22 0700 11/13/22 0714 11/13/22 0800  BP: 111/70 112/68   (!) 150/57  Pulse: 75 73   71  Resp: (!) 28 (!) 23   19  Temp:     (!) 97.2 F (36.2 C)    TempSrc:     Axillary    SpO2: 95% 93%   100%  Weight:          Height:              Exam: Gen somnolent, [place] O2 Neck no jvd Chest coarse rhonchi bilat RRR no RG Abd soft ntnd no mass or ascites +bs Ext diffuse 2-3+ pitting bilat LE edema up to hips Neuro as above, NF    RIJ TDC       Home meds- tylenol, ventolin, maalox, pepto bismol, buspar bid, clozapine bid, depakote er, cardura 4mg  every day, robitussin, synthroid, imodium, MOM, antivert, renavite, nicotine patch, nepro supps, protonix, senokot-s, desyrel, ingrezza, b-12, vit D      OP HD: [facility] MWF  3.5h   400/300  97.7kg   2/2.25 bath  RIJ TDC  Heparin 2000 - last and only OP HD was on 7/22, post wt 98.4kg - venofer 100mg  IV q HD thru 8/16 - no esa or vdra     Assessment/ Plan: Acute hypoxic resp failure - due to infection/ multifocal pna. Sig improvement, off Bipap and on 4 L [place] today.  Hypotension - shock, due to infection/ pna. Off pressors x 24 hrs now. Has midodrine ordered but hasn't been getting it.  Parainfluenza infection / bact PNA - RVP + for parainfluenza and blood cx's + for acinetobacter baumannii. On IV Unasyn. May need [facility] holiday if [facility] persists, try to treat through for now. Severe AKI / dialysis dependent - due to acute rhabdo, started HD 10/23/22 at [facility] Pt is not esrd yet. Last OP HD was 7/22. Did not tolerate HD here on 7/24. Had HD here Friday and Sat. Next HD Monday.  Volume - marked LE edema, off pressor support now. Had HD Friday w/ min UF, extra HD Sat w/ 2.5 L off. HD tomorrow, cont to lower vol as tolerated.  BP's improving.  Atrial fib - on IV diltiazem gtt Anemia esrd - Hb 8- 9 here, getting IV Fe in OP setting but will hold off due to infection. Was not on esa at OP unit -> started esa here w/ darbe 60 mcg sq weekly.  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 No vdra or binder listed. Follow.  Schizoaffective d/o - is on multiple psychiatric meds  Hypothyroidism  DNI / DNR     Vinson Moselle MD  [facility] 11/13/2022, 8:49 AM   Last Labs        Recent Labs  Lab 11/10/22 1336 11/10/22 1348 11/11/22 0102 11/12/22 0902  HGB 8.0*   < > 8.0* 7.8*  ALBUMIN 2.0*  --   --  2.0*  CALCIUM 8.4*  --  8.2* 8.1*  PHOS  --    < > 5.0* 6.0*  CREATININE 3.81*   < > 3.58* 3.96*  K 3.9   < > 3.7 4.8   < > =  values in this interval not displayed.      Last Labs  No results for input(s): "IRON", "TIBC", "FERRITIN" in the last 168 hours.   Inpatient medications:  Chlorhexidine Gluconate Cloth  6 each Topical Daily   darbepoetin (ARANESP) injection - DIALYSIS  60 mcg Subcutaneous Q Wed-1800   heparin  5,000 Units Subcutaneous Q8H   midodrine  10 mg Oral TID WC   nicotine  14 mg Transdermal Daily   mouth rinse  15 mL Mouth Rinse 4 times per day         ampicillin-sulbactam (UNASYN) IV Stopped (11/12/22 2324)   dexmedetomidine (PRECEDEX) IV infusion 0.2 mcg/kg/hr (11/13/22 0700)   dextrose 5 % and 0.9 % NaCl Stopped (11/12/22 0824)   diltiazem (CARDIZEM) infusion Stopped (11/13/22 2951)   norepinephrine (LEVOPHED) Adult infusion 2 mcg/min (11/13/22 0700)        docusate, heparin, ipratropium-albuterol, mouth rinse, polyethylene glycol

## 2022-11-14 NOTE — Progress Notes (Signed)
NAME:  Austin Marsh, MRN:  [id number], DOB:  1955/08/22, LOS: 4 ADMISSION DATE:  11/10/2022, CONSULTATION DATE:  7/24 REFERRING MD:  Dr. Dalene Seltzer, CHIEF COMPLAINT:  hypoxic respiratory failure; hypotension   History of Present Illness:  Patient is a 67 yo M w/ pertinent Pmh COPD, ESRD recently started on HD, schizoaffective w/ bipolar disorder, depression/anxiety, hypothyroidism, tobacco abuse, OSA on cpap present to [facility] ED 7/24 w/ hypoxic respiratory failure.  Of note patient recently admitted to [facility] 6/30 for lethargy. Found to have acute urinary retention and severe AKI on CKD 3a requiring dialysis. During hospital course, patient had hypovolemic shock and anemia from left neck hematoma. BP improved w/ PRBCs and fluids. Started on midodrine. Patient had RLL PNA and completed course of abx. Had rhabdo which improved w/ fluid resuscitation. Discharged on 7/18.   On 7/24 patient after dialysis having sob and hypoxia. Sats in 12s. Increased [place] to 4L. EMS called and placed on NRB and transferred to [facility] ED. On ED arrival sats 97% on NRB. BP 106/60. Patient placed on bipap. RR 30s. Afebrile. BNP 1,071, LA 4.8,  and trop 173. CXR w/ opacities b/l concerning for mulilobar pneumonia. Cultures obtained and started on cefepime/vanc. Covid/flu pending. PCCM consulted.   Pertinent ED Labs: wbc, 4.1, hgb 8, AG 18,   Pertinent  Medical History  COPD ESRD recently started on HD schizoaffective w/ bipolar disorder depression/anxiety Hypothyroidism tobacco abuse OSA on cpap Significant Hospital Events: Including procedures, antibiotic start and stop dates in addition to other pertinent events   7/24 admitted to [facility] w/ resp failure w/ hypoxia on bipap, started on unasyn and tobramycin for acinetobacter baumannii PNA and bacteremia.  7/26 weaned off of precedex overnight.  Unable to wean off BiPAP 7/27 Required BiPAP overnight,Started on Cardizem overnight for A-fib RVR  Interim History / Subjective:    Improving critically ill, Levophed tapered to off this morning. Stayed on nasal cannula overnight, did not require BiPAP. Agitation in spite of Precedex drip, requiring restraints   Objective   Blood pressure (!) 94/56, pulse 66, temperature 98.4 F (36.9 C), temperature source Axillary, resp. rate (!) 33, height 5\' 9"  (1.753 m), weight 93.4 kg, SpO2 98%.    FiO2 (%):  [40 %] 40 %   Intake/Output Summary (Last 24 hours) at 11/14/2022 1029 Last data filed at 11/14/2022 1000 Gross per 24 hour  Intake 1037.77 ml  Output 2600 ml  Net -1562.23 ml   Filed Weights   11/12/22 0343 11/13/22 0433 11/14/22 0500  Weight: 98.2 kg 98.2 kg 93.4 kg    Examination: General: chronically ill appearing male, waxing and waning mental status,no distress  HENT: mmm, no JVD  Lungs: No accessory muscle use, has a good cough, clear breath sounds bilateral Cardiovascular: s1s2 rrr Abdomen: soft, mildly distended, non tender  Extremities: warm and dry, 3+ pitting edema  Neuro: Currently lethargic, does not follow commands, RASS 0  Labs 7/28 shows increasing leukocytosis, normal electrolytes, stable anemia  Resolved Hospital Problem list    Assessment & Plan:  Acute respiratory failure w/ hypoxia Parainfluenza with superimposed Acinetobacter baumannii multifocal pneumonia +/- Pulmonary edema COPD OSA on cpap Improving respiratory status, off BiPAP -note DNR/DNI  Plan: -Transition to high flow nasal cannula ,titrate O2 as able to keep sats 88-92% - pulm toiletry - guafenesin -Volume removal with HD  Septic shock s/t acinetobacter bacteremia  Lactic Acidosis  Plan:  -Off Levophed, improving - Unasyn and tobramycin per pharmacy  -ID consult, unclear whether permacath  needs to come out, will try to treat through, await repeat culture from 7/27, may need removal of permacath if leukocytosis persists  New onset atrial fibrillation/RVR -Reverted to sinus rhythm on Cardizem drip -Monitor ,  holding off anticoagulation  New diagnosis of ESRD on HD  Patient with AKI secondary to rhabdo at previous admission, worsened to ESRD.  Tolerated HD 7/27 with fluid removal 500 cc Plan: -HD per renal for volume removal - Continue pressors as needed for dialysis , midodrine added -Trend BMP / urinary output - Replace electrolytes as indicated - Avoid nephrotoxic agents, ensure adequate renal perfusion  Anemia  Patient with chronic anemia with exacerbation at last admission in June secondary to large hematoma requiring transfusions. Was receiving IV Fe with HD.  Plan:  - Hold IV Fe given bacteremia  - Nephro to give EPO weekly  - Monitor CBC   Schizoaffective disorder, bipolar type Depression with anxiety Plan: -Resume clozapine, buspirone, valbenazine when able to take oral   Hypothyroidism Plan: -resume synthroid when able to take PO   Tobacco abuse Plan: -nicotine patch  BPH Plan: -doxazosin when able to take po   Need further discussion/clarification goals of care. Note DNR/DNI.  Will involve palliative care  Best Practice (right click and "Reselect all SmartList Selections" daily)   Diet/type: NPO DVT prophylaxis: prophylactic heparin  GI prophylaxis: PPI Lines: N/A Foley:  N/A Code Status:  DNR Last date of multidisciplinary goals of care discussion -]  Labs   CBC: Recent Labs  Lab 11/10/22 1336 11/10/22 1348 11/10/22 1730 11/10/22 2135 11/11/22 0102 11/12/22 0902 11/14/22 0026  WBC 4.1  --  2.3*  --  4.2 24.1* 42.7*  NEUTROABS 2.2  --   --   --   --   --  39.7*  HGB 8.0*   < > 7.9* 8.2* 8.0* 7.8* 7.9*  HCT 25.8*   < > 24.5* 24.0* 25.4* 23.9* 24.0*  MCV 98.9  --  99.6  --  96.9 97.2 92.7  PLT 255  --  214  --  221 185 151   < > = values in this interval not displayed.    Basic Metabolic Panel: Recent Labs  Lab 11/10/22 1336 11/10/22 1348 11/10/22 1551 11/10/22 1730 11/10/22 2135 11/11/22 0102 11/12/22 0902 11/12/22 1144 11/14/22 0026   NA 141 139  --   --  138 136 139  --  139  K 3.9 4.0  --   --  3.9 3.7 4.8  --  3.9  CL 100 101  --   --   --  97* 99  --  97*  CO2 23  --   --   --   --  21* 21*  --  24  GLUCOSE 83 78  --   --   --  96 96  --  102*  BUN 35* 33*  --   --   --  37* 58*  --  59*  CREATININE 3.81* 3.90* 3.84*  --   --  3.58* 3.96*  --  3.17*  CALCIUM 8.4*  --   --   --   --  8.2* 8.1*  --  8.5*  MG  --   --   --   --   --  1.3*  --  2.8*  --   PHOS  --   --   --  5.1*  --  5.0* 6.0*  --   --    GFR: Estimated Creatinine  Clearance: 25.2 mL/min (A) (by C-G formula based on SCr of 3.17 mg/dL (H)). Recent Labs  Lab 11/10/22 1345 11/10/22 1551 11/10/22 1730 11/11/22 0102 11/12/22 0902 11/14/22 0026  PROCALCITON  --  12.94  --   --   --   --   WBC  --   --  2.3* 4.2 24.1* 42.7*  LATICACIDVEN 4.8*  --  6.0* 5.2*  --   --     Liver Function Tests: Recent Labs  Lab 11/10/22 1336 11/12/22 0902  AST 31  --   ALT 19  --   ALKPHOS 53  --   BILITOT 1.1  --   PROT 5.0*  --   ALBUMIN 2.0* 2.0*   No results for input(s): "LIPASE", "AMYLASE" in the last 168 hours. No results for input(s): "AMMONIA" in the last 168 hours.  ABG    Component Value Date/Time   PHART 7.3 (L) 10/18/2022 1500   PCO2ART 29 (L) 10/18/2022 1500   PO2ART 73 (L) 10/18/2022 1500   HCO3 25.4 11/11/2022 0102   TCO2 28 11/10/2022 2135   ACIDBASEDEF 5.9 (H) 10/19/2022 0211   O2SAT 50.5 11/11/2022 0102     Coagulation Profile: No results for input(s): "INR", "PROTIME" in the last 168 hours.  Cardiac Enzymes: No results for input(s): "CKTOTAL", "CKMB", "CKMBINDEX", "TROPONINI" in the last 168 hours.  HbA1C: Hgb A1c MFr Bld  Date/Time Value Ref Range Status  10/17/2022 07:25 AM 5.7 (H) 4.8 - 5.6 % Final    Comment:    (NOTE)         Prediabetes: 5.7 - 6.4         Diabetes: >6.4         Glycemic control for adults with diabetes: <7.0     CBG: Recent Labs  Lab 11/13/22 1530 11/13/22 1909 11/13/22 2321  11/14/22 0305 11/14/22 0731  GLUCAP 96 92 100* 94 97    My independent critical care time was 31 minutes  Cyril Mourning MD. FCCP. [place] [facility] Pager : [phone]  If no response to pager , please call [phone] until 7 pm After 7:00 pm call Elink  [phone]   11/14/2022

## 2022-11-14 NOTE — Progress Notes (Signed)
eLink Physician-Brief Progress Note Patient Name: Austin Marsh DOB: 04/24/1955 MRN: [id number]   Date of Service  11/14/2022  HPI/Events of Note  67 year old male with a history of schizoaffective disorder that has had behavioral outbursts throughout his stay previously on Precedex infusion.  He has been weaned off the Precedex, is on numerous antipsychotics, but continues to have significant anxiety-stating that he is having severe pain and yelling out.  Increased ectopy as a result.  eICU Interventions  Add on olanzapine IV as needed QT prolonged at 505.  Add on oxycodone as needed through the tube     Intervention Category Minor Interventions: Agitation / anxiety - evaluation and management  Sheketa Ende 11/14/2022, 7:42 PM

## 2022-11-14 NOTE — Plan of Care (Signed)
  Problem: Clinical Measurements: Goal: Cardiovascular complication will be avoided Outcome: Progressing   Problem: Activity: Goal: Risk for activity intolerance will decrease Outcome: Not Progressing   Problem: Nutrition: Goal: Adequate nutrition will be maintained Outcome: Not Progressing   Problem: Coping: Goal: Level of anxiety will decrease Outcome: Not Progressing

## 2022-11-15 ENCOUNTER — Inpatient Hospital Stay (HOSPITAL_COMMUNITY): Payer: Medicare Other

## 2022-11-15 DIAGNOSIS — F17210 Nicotine dependence, cigarettes, uncomplicated: Secondary | ICD-10-CM | POA: Diagnosis not present

## 2022-11-15 DIAGNOSIS — J9601 Acute respiratory failure with hypoxia: Secondary | ICD-10-CM | POA: Diagnosis not present

## 2022-11-15 DIAGNOSIS — Z7189 Other specified counseling: Secondary | ICD-10-CM | POA: Diagnosis not present

## 2022-11-15 DIAGNOSIS — E440 Moderate protein-calorie malnutrition: Secondary | ICD-10-CM | POA: Diagnosis not present

## 2022-11-15 DIAGNOSIS — J189 Pneumonia, unspecified organism: Secondary | ICD-10-CM | POA: Diagnosis not present

## 2022-11-15 DIAGNOSIS — Z515 Encounter for palliative care: Secondary | ICD-10-CM

## 2022-11-15 LAB — GLUCOSE, CAPILLARY
Glucose-Capillary: 112 mg/dL — ABNORMAL HIGH (ref 70–99)
Glucose-Capillary: 114 mg/dL — ABNORMAL HIGH (ref 70–99)
Glucose-Capillary: 117 mg/dL — ABNORMAL HIGH (ref 70–99)
Glucose-Capillary: 89 mg/dL (ref 70–99)
Glucose-Capillary: 92 mg/dL (ref 70–99)
Glucose-Capillary: 97 mg/dL (ref 70–99)

## 2022-11-15 LAB — BASIC METABOLIC PANEL
Anion gap: 11 (ref 5–15)
BUN: 50 mg/dL — ABNORMAL HIGH (ref 8–23)
CO2: 26 mmol/L (ref 22–32)
Calcium: 8.2 mg/dL — ABNORMAL LOW (ref 8.9–10.3)
Chloride: 99 mmol/L (ref 98–111)
Creatinine, Ser: 2.56 mg/dL — ABNORMAL HIGH (ref 0.61–1.24)
GFR, Estimated: 27 mL/min — ABNORMAL LOW (ref >60)
Glucose, Bld: 96 mg/dL (ref 70–99)
Potassium: 4.1 mmol/L (ref 3.5–5.1)
Sodium: 136 mmol/L (ref 135–145)

## 2022-11-15 LAB — PHOSPHORUS
Phosphorus: 5.7 mg/dL — ABNORMAL HIGH (ref 2.5–4.6)
Phosphorus: 5.8 mg/dL — ABNORMAL HIGH (ref 2.5–4.6)

## 2022-11-15 LAB — CBC
HCT: 22.5 % — ABNORMAL LOW (ref 39.0–52.0)
Hemoglobin: 7.3 g/dL — ABNORMAL LOW (ref 13.0–17.0)
MCH: 31.6 pg (ref 26.0–34.0)
MCHC: 32.4 g/dL (ref 30.0–36.0)
MCV: 97.4 fL (ref 80.0–100.0)
Platelets: 146 10*3/uL — ABNORMAL LOW (ref 150–400)
RBC: 2.31 MIL/uL — ABNORMAL LOW (ref 4.22–5.81)
RDW: 17.6 % — ABNORMAL HIGH (ref 11.5–15.5)
WBC: 42.7 10*3/uL — ABNORMAL HIGH (ref 4.0–10.5)
nRBC: 0.3 % — ABNORMAL HIGH (ref 0.0–0.2)

## 2022-11-15 LAB — MAGNESIUM
Magnesium: 2.1 mg/dL (ref 1.7–2.4)
Magnesium: 2 mg/dL (ref 1.7–2.4)

## 2022-11-15 MED ORDER — VALPROATE SODIUM 250 MG/5ML PO SOLN: 500.0000 mg | Freq: Two times a day (BID) | ORAL | Status: AC

## 2022-11-15 MED ORDER — POLYETHYLENE GLYCOL 3350 17 G PO PACK
17.0000 g | PACK | Freq: Every day | ORAL | Status: DC
  Administered 2022-11-15 – 2022-11-16 (×2): 17 g
  Filled 2022-11-15 (×2): qty 1

## 2022-11-15 MED ORDER — CLOZAPINE 100 MG PO TABS
100.0000 mg | ORAL_TABLET | Freq: Every day | ORAL | Status: DC
  Administered 2022-11-15: 100 mg
  Filled 2022-11-15 (×3): qty 1

## 2022-11-15 MED ORDER — HEPARIN SODIUM (PORCINE) 1000 UNIT/ML DIALYSIS
2000.0000 [IU] | Freq: Once | INTRAMUSCULAR | Status: AC
  Administered 2022-11-15: 2000 [IU] via INTRAVENOUS_CENTRAL
  Filled 2022-11-15: qty 2

## 2022-11-15 MED ORDER — PROSOURCE TF20 ENFIT COMPATIBL EN LIQD: 60.0000 mL | Freq: Every day | ENTERAL | Status: AC

## 2022-11-15 MED ORDER — OSMOLITE 1.5 CAL PO LIQD
1000.0000 mL | ORAL | Status: DC
  Administered 2022-11-15 – 2022-11-16 (×2): 1000 mL
  Filled 2022-11-15: qty 1000

## 2022-11-15 MED ORDER — STERILE WATER FOR INJECTION IJ SOLN
INTRAMUSCULAR | Status: AC
  Administered 2022-11-15: 2.1 mL
  Filled 2022-11-15: qty 10

## 2022-11-15 MED ORDER — LEVOTHYROXINE SODIUM 75 MCG PO TABS
75.0000 ug | ORAL_TABLET | Freq: Every day | ORAL | Status: DC
  Administered 2022-11-15 – 2022-11-16 (×2): 75 ug
  Filled 2022-11-15 (×2): qty 1

## 2022-11-15 MED ORDER — SODIUM CHLORIDE 0.9 % IV SOLN: 3.0000 g | Freq: Two times a day (BID) | INTRAVENOUS | Status: AC

## 2022-11-15 MED ORDER — RENA-VITE PO TABS
1.0000 | ORAL_TABLET | Freq: Every day | ORAL | Status: DC
  Administered 2022-11-15 – 2022-11-16 (×2): 1
  Filled 2022-11-15 (×2): qty 1

## 2022-11-15 MED ORDER — HEPARIN SODIUM (PORCINE) 1000 UNIT/ML DIALYSIS
2000.0000 [IU] | Freq: Once | INTRAMUSCULAR | Status: DC
  Filled 2022-11-15: qty 2

## 2022-11-15 MED ORDER — PROSOURCE TF20 ENFIT COMPATIBL EN LIQD
60.0000 mL | Freq: Every day | ENTERAL | Status: DC
  Administered 2022-11-15 – 2022-11-16 (×2): 60 mL
  Filled 2022-11-15 (×2): qty 60

## 2022-11-15 MED ORDER — SENNOSIDES-DOCUSATE SODIUM 8.6-50 MG PO TABS
1.0000 | ORAL_TABLET | Freq: Every day | ORAL | Status: DC
  Administered 2022-11-15: 1
  Filled 2022-11-15 (×2): qty 1

## 2022-11-15 MED ORDER — HEPARIN SODIUM (PORCINE) 5000 UNIT/ML IJ SOLN: 5000.0000 [IU] | Freq: Three times a day (TID) | INTRAMUSCULAR | Status: AC

## 2022-11-15 MED ORDER — HEPARIN SODIUM (PORCINE) 1000 UNIT/ML DIALYSIS
2000.0000 [IU] | INTRAMUSCULAR | Status: DC | PRN
  Filled 2022-11-15: qty 2

## 2022-11-15 MED ORDER — VITAL HIGH PROTEIN PO LIQD: 1000.0000 mL | ORAL | Status: DC

## 2022-11-15 MED ORDER — MIDODRINE HCL 10 MG PO TABS: 10.0000 mg | ORAL_TABLET | Freq: Three times a day (TID) | ORAL | Status: AC

## 2022-11-15 MED ORDER — STERILE WATER FOR INJECTION IJ SOLN
INTRAMUSCULAR | Status: AC
  Filled 2022-11-15: qty 10

## 2022-11-15 NOTE — Discharge Summary (Addendum)
Physician Discharge Summary         Patient ID: Austin Marsh MRN: [id number] DOB/AGE: 69-Jun-1957 69 y.o.  Admit date: 11/10/2022 Discharge date: 11/16/2022  Discharge Diagnoses:    Active Hospital Problems   Diagnosis Date Noted   Acute respiratory failure with hypoxia (HCC) 10/19/2022   Malnutrition of moderate degree 11/15/2022   Multifocal pneumonia 11/11/2022   Bacteremia 10/19/2022    Resolved Hospital Problems  No resolved problems to display.      Discharge summary    Patient is a 67 yo M w/ pertinent Pmh COPD, ESRD recently started on HD, schizoaffective w/ bipolar disorder, depression/anxiety, hypothyroidism, tobacco abuse, OSA on cpap present to [facility] ED 7/24 w/ hypoxic respiratory failure.   Of note patient recently admitted to [facility] 6/30 for lethargy. Found to have acute urinary retention and severe AKI on CKD 3a requiring dialysis. During hospital course, patient had hypovolemic shock and anemia from left neck hematoma. BP improved w/ PRBCs and fluids. Started on midodrine. Patient had RLL PNA and completed course of abx. Had rhabdo which improved w/ fluid resuscitation. Discharged on 7/18.    On 7/24 patient after dialysis having sob and hypoxia. Sats in 68s. Increased [place] to 4L. EMS called and placed on NRB and transferred to [facility] ED. On ED arrival sats 97% on NRB. BP 106/60. Patient placed on bipap. RR 30s. Afebrile. BNP 1,071, LA 4.8,  and trop 173. CXR w/ opacities b/l concerning for mulilobar pneumonia. Cultures obtained and started on cefepime/vanc. Covid/flu pending. PCCM consulted.     Patient was admitted to ICU for:  Acute respiratory failure with hypoxia superimposed on parainfluenza with COPD and OSA on cpap. Pt admitted 7/24 with respiratory failure and hypoxia requiring bipap placement post dialysis. Pt saturations were 97% on NRB and chest xr with opacities B/L concerning for multilobar pneumonia. Cultures obtained and started on unasyn and tobramycin iv  ABX therapy. Hypotension developed requiring levophed gtt initiation- now off. Subsequently  hospital course complicated by agitation with hx schizoaffective w/ bipolar disorder requiring precedex infusion- now off. Additionally pt has continued to require BIPAP overnight and has episode of new onset Afib RVR which has converted to NSR with cardizem infusion.     Discharge Plan by Active Problems    Plan:  Acute respiratory failure with hypoxia Parainfluenza with sueprimposed acinetobacter baumannii multifocal PNA +/- pulmonary edema COPD OSA on CPAP Pt with improved respiratory status, now off BIPAP and on 6L [place] today Please not DNR/DNI -wean oxygen as tolerated and CXR intermittenly as indicated, -abx therapy with unasyn (continue for 10 days post HD cath exchange)   Septic shock secondary to acinetobacter bacteremia- resolved Lactic acidosis -unasyn (continue for 10 days post HD cath exchange) abx therapy per ID -continue midodrine   New onset afib with RVR Pt reverted to NSR on cardizem gtt -holding anticoagulation at this time   New diagnosis of ESRD on HD Pt with Aki dt rhabdo at previous admission, worsened to ESRD. Tolerated HD 7/27 with fluid removal - HD per renal for volume removal -continue midodrine -trend BMP/urinary output -replace electrolytes as indicated -avoid nephrotoxic agents, renally dose medications   Anemia Pt has chronic anemia with exacerbationa t last admission in June DT large hematoma. Requiried transfusions and was recievingIV iron with HD -hold IV iron given bacteremia -EPO weekly  -monitor CBC   Schizoaffective disorder, bipolar type Depression with anxiety -resumed clozapine, buspirone, valbenazine 7/28. Clozapine dosage adjusted to 100mg  BID   Hypothyroid -  continue synthroid   Tobacco abuse -nicotine patch PRN -encourage cessation of tobacco abuse in order to mitigate associated risk factors   BPH -doxazosin when  able to tolerate PO intake   Significant Hospital tests/ studies   7/24 admitted to [facility] w/ resp failure w/ hypoxia on bipap, started on unasyn and tobramycin for acinetobacter baumannii PNA and bacteremia.  7/26 weaned off of precedex overnight.  Unable to wean off BiPAP 7/27 Required BiPAP overnight,Started on Cardizem overnight for A-fib RVR  Procedures    Culture data/antimicrobials   [facility] 7/27 NGTD day 2 (11/15/2022) BC 7/24- acinetobacter baumannii in 2/2 cultures with ID recommendations for above abx therapy  Resp viral pannel by PCR- positive for parainfluenza virus 3    Discharge Exam: BP 114/62   Pulse 84   Temp 97.9 F (36.6 C) (Axillary)   Resp 17   Ht 5\' 9"  (1.753 m)   Wt 91.2 kg   SpO2 94%   BMI 29.69 kg/m    Labs at discharge   Lab Results  Component Value Date   CREATININE 2.56 (H) 11/15/2022   BUN 50 (H) 11/15/2022   NA 136 11/15/2022   K 4.1 11/15/2022   CL 99 11/15/2022   CO2 26 11/15/2022   Lab Results  Component Value Date   WBC 39.4 (H) 11/16/2022   HGB 7.1 (L) 11/16/2022   HCT 22.2 (L) 11/16/2022   MCV 97.8 11/16/2022   PLT 146 (L) 11/16/2022   Lab Results  Component Value Date   ALT 19 11/10/2022   AST 31 11/10/2022   ALKPHOS 53 11/10/2022   BILITOT 1.1 11/10/2022   Lab Results  Component Value Date   INR 1.3 (H) 10/19/2022   INR 1.3 (H) 10/18/2022   INR 1.4 (H) 10/18/2022    Current radiological studies    VAS [place] LOWER EXTREMITY VENOUS (DVT)  Result Date: 11/16/2022  Lower Venous DVT Study Patient Name:  Austin Marsh  Date of Exam:   11/16/2022 Medical Rec #: [id number]      Accession #:    [id number] Date of Birth: May 03, 1955     Patient Gender: M Patient Age:   67 years Exam Location:  [facility] Procedure:      VAS [place] LOWER EXTREMITY VENOUS (DVT) Referring Phys: STEPHANIE REESE --------------------------------------------------------------------------------  Indications: Swelling, and Edema.  Risk Factors: Immobility  Surgery 10/27/22. Comparison Study: No prior study Performing Technologist: Shona Simpson  Examination Guidelines: A complete evaluation includes B-mode imaging, spectral Doppler, color Doppler, and power Doppler as needed of all accessible portions of each vessel. Bilateral testing is considered an integral part of a complete examination. Limited examinations for reoccurring indications may be performed as noted. The reflux portion of the exam is performed with the patient in reverse Trendelenburg.  +-----+---------------+---------+-----------+----------+--------------+ RIGHTCompressibilityPhasicitySpontaneityPropertiesThrombus Aging +-----+---------------+---------+-----------+----------+--------------+ CFV  Full           Yes      Yes                                 +-----+---------------+---------+-----------+----------+--------------+   +---------+---------------+---------+-----------+----------+--------------+ LEFT     CompressibilityPhasicitySpontaneityPropertiesThrombus Aging +---------+---------------+---------+-----------+----------+--------------+ CFV      Full           Yes      Yes                                 +---------+---------------+---------+-----------+----------+--------------+  SFJ      Full                                                        +---------+---------------+---------+-----------+----------+--------------+ FV Prox  Full                                                        +---------+---------------+---------+-----------+----------+--------------+ FV Mid   Full                                                        +---------+---------------+---------+-----------+----------+--------------+ FV DistalFull                                                        +---------+---------------+---------+-----------+----------+--------------+ PFV      Full                                                         +---------+---------------+---------+-----------+----------+--------------+ POP      Full           Yes      Yes                                 +---------+---------------+---------+-----------+----------+--------------+ PTV      Full                                                        +---------+---------------+---------+-----------+----------+--------------+ PERO     Full                                                        +---------+---------------+---------+-----------+----------+--------------+     Summary: RIGHT: - No evidence of common femoral vein obstruction.  LEFT: - There is no evidence of deep vein thrombosis in the lower extremity.  - No cystic structure found in the popliteal fossa.  *See table(s) above for measurements and observations.    Preliminary    DG Abd Portable 1V  Result Date: 11/15/2022 CLINICAL DATA:  Feeding tube placement. EXAM: PORTABLE ABDOMEN - 1 VIEW COMPARISON:  Abdominal x-ray from yesterday. FINDINGS: New feeding tube with the tip near the pylorus. Normal bowel gas pattern. No acute osseous abnormality. IMPRESSION: 1. Feeding tube tip near the pylorus. Electronically Signed   By: Chrissie Noa  Howell Pringle M.D.   On: 11/15/2022 13:30   DG Abd 1 View  Result Date: 11/14/2022 CLINICAL DATA:  Nasogastric tube placement. EXAM: ABDOMEN - 1 VIEW COMPARISON:  None Available. FINDINGS: Distal tip of nasogastric tube is seen in expected position of the stomach. IMPRESSION: Distal tip of nasogastric tube is seen in expected position of the stomach. Electronically Signed   By: Lupita Raider M.D.   On: 11/14/2022 17:23    Disposition:    Discharge disposition: 63-Long Term Care       Discharge Instructions     Call MD for:  difficulty breathing, headache or visual disturbances   Complete by: As directed    Call MD for:  hives   Complete by: As directed    Call MD for:  persistant dizziness or light-headedness   Complete by: As directed    Call MD  for:  persistant nausea and vomiting   Complete by: As directed    Call MD for:  redness, tenderness, or signs of infection (pain, swelling, redness, odor or green/yellow discharge around incision site)   Complete by: As directed    Call MD for:  severe uncontrolled pain   Complete by: As directed    Call MD for:  temperature >100.4   Complete by: As directed    Diet general   Complete by: As directed    Tube feeds per medrec   Discharge instructions   Complete by: As directed    Discharge to [facility]. Please review MAR/medication reconciliation.   Discharge wound care:   Complete by: As directed    Per Protocol   Increase activity slowly   Complete by: As directed    No wound care   Complete by: As directed        Allergies as of 11/16/2022       Reactions   Haldol [haloperidol Lactate] Other (See Comments)   Tardive diskonesia   Other    [employer] Beans cause patient to vomit   Thorazine [chlorpromazine] Other (See Comments)   "messes me all up"   Trazodone And Nefazodone Other (See Comments)   "makes me feel like I'm dying."        Medication List     STOP taking these medications    acetaminophen 325 MG tablet Commonly known as: TYLENOL   alum & mag hydroxide-simeth 200-200-20 MG/5ML suspension Commonly known as: MAALOX/MYLANTA   bismuth subsalicylate 262 MG/15ML suspension Commonly known as: PEPTO BISMOL   divalproex 500 MG 24 hr tablet Commonly known as: DEPAKOTE ER   loperamide 2 MG capsule Commonly known as: IMODIUM   magnesium hydroxide 400 MG/5ML suspension Commonly known as: MILK OF MAGNESIA   meclizine 25 MG tablet Commonly known as: ANTIVERT   Vitamin D (Cholecalciferol) 25 MCG (1000 UT) Tabs       TAKE these medications    albuterol 108 (90 Base) MCG/ACT inhaler Commonly known as: VENTOLIN HFA Inhale 2 puffs into the lungs every 4 (four) hours as needed for wheezing or shortness of breath.   Ampicillin-Sulbactam 3 g in sodium  chloride 0.9 % 100 mL Inject 3 g into the vein 2 (two) times daily.   benztropine 2 MG tablet Commonly known as: COGENTIN Take 2 mg by mouth 2 (two) times daily.   busPIRone 15 MG tablet Commonly known as: BUSPAR Take 15 mg by mouth 2 (two) times daily.   cloZAPine 100 MG tablet Commonly known as: CLOZARIL Place 1 tablet (100 mg total)  into feeding tube daily. What changed: You were already taking a medication with the same name, and this prescription was added. Make sure you understand how and when to take each.   cloZAPine 100 MG tablet Commonly known as: CLOZARIL Place 1 tablet (100 mg total) into feeding tube at bedtime. What changed:  how much to take how to take this when to take this additional instructions   doxazosin 4 MG tablet Commonly known as: CARDURA Take 4 mg by mouth daily.   epoetin alfa 16606 UNIT/ML injection Commonly known as: EPOGEN Inject 1 mL (10,000 Units total) into the vein every Monday, Wednesday, and Friday with hemodialysis.   feeding supplement (NEPRO CARB STEADY) Liqd Take 237 mLs by mouth 3 (three) times daily between meals.   feeding supplement (PROSource TF20) liquid Place 60 mLs into feeding tube daily.   guaiFENesin 100 MG/5ML liquid Commonly known as: ROBITUSSIN Take 200 mg by mouth every 6 (six) hours as needed for cough.   heparin 5000 UNIT/ML injection Inject 1 mL (5,000 Units total) into the skin every 8 (eight) hours.   Ingrezza 80 MG capsule Generic drug: valbenazine Take 80 mg by mouth daily.   levothyroxine 75 MCG tablet Commonly known as: SYNTHROID Take 75 mcg by mouth daily before breakfast.   midodrine 10 MG tablet Commonly known as: PROAMATINE Place 1 tablet (10 mg total) into feeding tube every 8 (eight) hours.   multivitamin Tabs tablet Take 1 tablet by mouth at bedtime.   nicotine 21 mg/24hr patch Commonly known as: NICODERM CQ - dosed in mg/24 hours Place 1 patch (21 mg total) onto the skin daily.    pantoprazole 40 MG tablet Commonly known as: PROTONIX Take 40 mg by mouth daily.   sennosides-docusate sodium 8.6-50 MG tablet Commonly known as: SENOKOT-S Take 1 tablet by mouth in the morning and at bedtime.   Valproate Sodium 250 MG/5ML Soln solution Commonly known as: DEPAKENE Place 10 mLs (500 mg total) into feeding tube 2 (two) times daily.   vitamin B-12 100 MCG tablet Commonly known as: CYANOCOBALAMIN Take 100 mcg by mouth daily.               Discharge Care Instructions  (From admission, onward)           Start     Ordered   11/16/22 0000  Discharge wound care:       Comments: Per Protocol   11/16/22 0233             Discharge Condition:    stable  Physician Statement:   The Patient was personally examined, the discharge assessment and plan has been personally reviewed and I agree with ACNP Babcock's assessment and plan. 35 minutes of time have been dedicated to discharge assessment, planning and discharge instructions.   Signed: Janeece Riggers 11/16/2022, 1:40 PM

## 2022-11-15 NOTE — Progress Notes (Addendum)
NAME:  Austin Marsh, MRN:  [id number], DOB:  Aug 28, 1955, LOS: 5 ADMISSION DATE:  11/10/2022, CONSULTATION DATE:  7/24 REFERRING MD:  Dr. Dalene Seltzer, CHIEF COMPLAINT:  hypoxic respiratory failure; hypotension   History of Present Illness:  Patient is a 67 yo M w/ pertinent Pmh COPD, ESRD recently started on HD, schizoaffective w/ bipolar disorder, depression/anxiety, hypothyroidism, tobacco abuse, OSA on cpap present to [facility] ED 7/24 w/ hypoxic respiratory failure.  Of note patient recently admitted to [facility] 6/30 for lethargy. Found to have acute urinary retention and severe AKI on CKD 3a requiring dialysis. During hospital course, patient had hypovolemic shock and anemia from left neck hematoma. BP improved w/ PRBCs and fluids. Started on midodrine. Patient had RLL PNA and completed course of abx. Had rhabdo which improved w/ fluid resuscitation. Discharged on 7/18.   On 7/24 patient after dialysis having sob and hypoxia. Sats in 32s. Increased  to 4L. EMS called and placed on NRB and transferred to [facility] ED. On ED arrival sats 97% on NRB. BP 106/60. Patient placed on bipap. RR 30s. Afebrile. BNP 1,071, LA 4.8,  and trop 173. CXR w/ opacities b/l concerning for mulilobar pneumonia. Cultures obtained and started on cefepime/vanc. Covid/flu pending. PCCM consulted.   Pertinent ED Labs: wbc, 4.1, hgb 8, AG 18,   Pertinent  Medical History  COPD ESRD recently started on HD schizoaffective w/ bipolar disorder depression/anxiety Hypothyroidism tobacco abuse OSA on cpap Significant Hospital Events: Including procedures, antibiotic start and stop dates in addition to other pertinent events   7/24 admitted to [facility] w/ resp failure w/ hypoxia on bipap, started on unasyn and tobramycin for acinetobacter baumannii PNA and bacteremia.  7/26 weaned off of precedex overnight.  Unable to wean off BiPAP 7/27 Required BiPAP overnight,Started on Cardizem overnight for A-fib RVR  Interim History / Subjective:    Off BiPAP and high flow.  Now on 6 L nasal cannula Remains off pressors   Objective   Blood pressure (!) 104/48, pulse 79, temperature 97.9 F (36.6 C), temperature source Axillary, resp. rate 20, height 5\' 9"  (1.753 m), weight 91.3 kg, SpO2 100%.        Intake/Output Summary (Last 24 hours) at 11/15/2022 0754 Last data filed at 11/15/2022 0600 Gross per 24 hour  Intake 807.59 ml  Output 300 ml  Net 507.59 ml   Filed Weights   11/14/22 0500 11/14/22 2053 11/15/22 0131  Weight: 93.4 kg 91.3 kg 91.3 kg    Examination: Gen:      No acute distress, chronically ill-appearing HEENT:  EOMI, sclera anicteric Neck:     No masses; no thyromegaly Lungs:    Clear to auscultation bilaterally; normal respiratory effort CV:         Regular rate and rhythm; no murmurs Abd:      + bowel sounds; soft, non-tender; no palpable masses, no distension Ext:    2+ edema; adequate peripheral perfusion Skin:      Warm and dry; no rash Neuro: Awake, does not answer questions appropriately  Labs/imaging reviewed No new labs   Resolved Hospital Problem list    Assessment & Plan:  Acute respiratory failure w/ hypoxia Parainfluenza with superimposed Acinetobacter baumannii multifocal pneumonia +/- Pulmonary edema COPD OSA on cpap Improving respiratory status, off BiPAP -note DNR/DNI  Plan: Wean down oxygen as tolerated, continue intermittent chest x-ray Antibiotics treatment with Unasyn and minocycline  Septic shock s/t acinetobacter bacteremia  Lactic Acidosis Plan:  -Off Levophed, improving - Unasyn and minocycline  per ID  New onset atrial fibrillation/RVR -Reverted to sinus rhythm on Cardizem drip -Monitor , holding off anticoagulation  New diagnosis of ESRD on HD  Patient with AKI secondary to rhabdo at previous admission, worsened to ESRD.  Tolerated HD 7/27 with fluid removal 500 cc Plan: - HD per renal for volume removal - Continue midodrine -Trend BMP / urinary output -  Replace electrolytes as indicated - Avoid nephrotoxic agents, ensure adequate renal perfusion  Anemia  Patient with chronic anemia with exacerbation at last admission in June secondary to large hematoma requiring transfusions. Was receiving IV Fe with HD.  Plan:  - Hold IV Fe given bacteremia  - Nephro to give EPO weekly  - Monitor CBC   Schizoaffective disorder, bipolar type Depression with anxiety Plan: -Resumed clozapine, buspirone, valbenazine yesterday. May need psych consult if mental status does not improve.    Hypothyroidism Plan: -resume synthroid   Tobacco abuse Plan: -nicotine patch  BPH Plan: -doxazosin when able to take po   Need further discussion/clarification goals of care. Note DNR/DNI.  Palliative care has been consulted  Best Practice (right click and "Reselect all SmartList Selections" daily)   Diet/type: NPO DVT prophylaxis: prophylactic heparin  GI prophylaxis: N/A Lines: Dialysis Catheter Foley:  N/A Code Status:  DNR Last date of multidisciplinary goals of care discussion -]  Critical care time: NA   Chilton Greathouse MD [facility] See Amion for pager  If no response to pager , please call [phone] until 7pm After 7:00 pm call Elink  [phone] 11/15/2022, 7:55 AM

## 2022-11-15 NOTE — Procedures (Signed)
Cortrak  Person Inserting Tube:  Maylon Peppers C, RD Tube Type:  Cortrak - 43 inches Tube Size:  10 Tube Location:  Left nare Secured by: Bridle Technique Used to Measure Tube Placement:  Marking at nare/corner of mouth Cortrak Secured At:  65 cm   Cortrak Tube Team Note:  Consult received to place a Cortrak feeding tube.   X-ray is required, abdominal x-ray has been ordered by the Cortrak team. Please confirm tube placement before using the Cortrak tube.   If the tube becomes dislodged please keep the tube and contact the Cortrak team at [email or web address] for replacement.  If after hours and replacement cannot be delayed, place a NG tube and confirm placement with an abdominal x-ray.    Lockie Pares., RD, LDN, CNSC See AMiON for contact information

## 2022-11-15 NOTE — Progress Notes (Signed)
[facility]  Date of Admission:  11/10/2022     Lines:  Right chest tunneled hd line   Abx: 7/25-c amp/sulb   7/26-28 minocycline 7/24 vanc/cefepime                                                          Assessment: 67 yo male with hypothyroidism, copd, progressive ckd to esrd (dialysis started 10/23/22 via internal jugular hd line), bipolar, schizoaffective, recent [facility] admission for hypovolumic shock and ?blood loss related shock with course complicated by rhabdo leading to aki on ckd and required dialysis, discharged 7/18, readmitted 7/24 for hypoxic respiratory failure, course complicated by septic shock and afib-rvr     Xray shows bilateral pna, and respiratory pcr suggest parainfluenza Admission bcx also grew acinetobacter   7/24 respiratory viral pcr parainfluenza virus 7/24 bcx acinetobacter baumannii (S bactrim, cipro, amp-sulb)       Bacteremia source suspected to be hd catheter   ---------------- 11/15/22 id assessment Patient intermittently confused Elevated wbc stable no sign of cdiff or further sepsis  Discussed with our ID pharmacy team. Feasibility of abx lock for HD a concern  In this setting, better to guidewire exchange the HD line    -continue amp-sulbactam 3 gram iv q12hr -can stop minocycline, unclear any benefit -once guidewire exchanged the hd line is done, finish 10 more days of amp-sulbactam from that date -[facility] -no need for ID follow up -discussed with primary team    Principal Problem:   Acute respiratory failure with hypoxia (HCC) Active Problems:   Multifocal pneumonia   Malnutrition of moderate degree   Allergies  Allergen Reactions   Haldol [Haloperidol Lactate] Other (See Comments)    Tardive diskonesia   Other     [employer] Beans cause patient to vomit   Thorazine [Chlorpromazine] Other (See Comments)    "messes me all up"   Trazodone And Nefazodone Other (See  Comments)    "makes me feel like I'm dying."    Scheduled Meds:  benztropine  2 mg Per Tube BID   busPIRone  15 mg Per Tube BID   Chlorhexidine Gluconate Cloth  6 each Topical Q0600   cloZAPine  100 mg Per Tube Daily   cloZAPine  100 mg Per Tube QHS   darbepoetin (ARANESP) injection - DIALYSIS  60 mcg Subcutaneous Q Wed-1800   feeding supplement (PROSource TF20)  60 mL Per Tube Daily   heparin  5,000 Units Subcutaneous Q8H   levothyroxine  75 mcg Per Tube Q0600   midodrine  10 mg Per Tube Q8H   minocycline  200 mg Per Tube Q12H   multivitamin  1 tablet Per Tube QHS   nicotine  14 mg Transdermal Daily   mouth rinse  15 mL Mouth Rinse 4 times per day   polyethylene glycol  17 g Per Tube Daily   senna-docusate  1 tablet Per Tube Daily   sterile water (preservative free)       valbenazine  80 mg Oral Daily   Valproate Sodium  500 mg Per Tube BID   Continuous Infusions:  ampicillin-sulbactam (UNASYN) IV Stopped (11/15/22 1003)   dextrose 5 % and 0.9 % NaCl 10 mL/hr at 11/15/22 1400   feeding supplement (  OSMOLITE 1.5 CAL) 1,000 mL (11/15/22 1441)   PRN Meds:.docusate, heparin, ipratropium-albuterol, OLANZapine, mouth rinse, oxyCODONE, polyethylene glycol, sterile water (preservative free)   SUBJECTIVE: Per nursing staff just got several psychotropic meds and patient now mumbling but was talking clearly several hours before Afebrile  Wbc around 40  No worsening diarrhea. On tf  Review of Systems: ROS All other ROS was negative, except mentioned above     OBJECTIVE: Vitals:   11/15/22 1118 11/15/22 1200 11/15/22 1300 11/15/22 1400  BP:  (!) 102/54 (!) 116/54 (!) 113/57  Pulse:  85 84 86  Resp:  (!) 29 (!) 22 (!) 23  Temp: 97.8 F (36.6 C)     TempSrc: Axillary     SpO2:  97% 95% 99%  Weight:      Height:       Body mass index is 29.72 kg/m.  Physical Exam  General/constitutional: no distress, trying to voice words but only sounds made, not entirely  cooperative/confused HEENT: Normocephalic CV: rrr no mrg Lungs: normal respiratory effort Abd: Soft, Nontender Skin: No Rash Neuro: confused   Central line presence: right chest tunneled hd cath site no purulence/tenderness  Lab Results Lab Results  Component Value Date   WBC 42.7 (H) 11/15/2022   HGB 7.3 (L) 11/15/2022   HCT 22.5 (L) 11/15/2022   MCV 97.4 11/15/2022   PLT 146 (L) 11/15/2022    Lab Results  Component Value Date   CREATININE 2.56 (H) 11/15/2022   BUN 50 (H) 11/15/2022   NA 136 11/15/2022   K 4.1 11/15/2022   CL 99 11/15/2022   CO2 26 11/15/2022    Lab Results  Component Value Date   ALT 19 11/10/2022   AST 31 11/10/2022   ALKPHOS 53 11/10/2022   BILITOT 1.1 11/10/2022      Microbiology: Recent Results (from the past 240 hour(s))  Blood culture (routine x 2)     Status: Abnormal   Collection Time: 11/10/22  2:16 PM   Specimen: BLOOD  Result Value Ref Range Status   Specimen Description BLOOD BLOOD LEFT HAND  Final   Special Requests   Final    BOTTLES DRAWN AEROBIC ONLY Blood Culture adequate volume   Culture  Setup Time   Final    GRAM NEGATIVE RODS AEROBIC BOTTLE ONLY CRITICAL RESULT CALLED TO, READ BACK BY AND VERIFIED WITH: PHARMD G. ABBOTT 11/11/22 @ 0133 BY AB Performed at [facility], [address], [place], [place] [address]    Culture ACINETOBACTER BAUMANNII (A)  Final   Report Status 11/14/2022 FINAL  Final   Organism ID, Bacteria ACINETOBACTER BAUMANNII  Final      Susceptibility   Acinetobacter baumannii - MIC*    CEFTAZIDIME 4 SENSITIVE Sensitive     CIPROFLOXACIN <=0.25 SENSITIVE Sensitive     GENTAMICIN <=1 SENSITIVE Sensitive     IMIPENEM <=0.25 SENSITIVE Sensitive     PIP/TAZO <=4 SENSITIVE Sensitive     TRIMETH/SULFA <=20 SENSITIVE Sensitive     AMPICILLIN/SULBACTAM <=2 SENSITIVE Sensitive     * ACINETOBACTER BAUMANNII  Blood Culture ID Panel (Reflexed)     Status: Abnormal   Collection Time: 11/10/22  2:16 PM   Result Value Ref Range Status   Enterococcus faecalis NOT DETECTED NOT DETECTED Final   Enterococcus Faecium NOT DETECTED NOT DETECTED Final   Listeria monocytogenes NOT DETECTED NOT DETECTED Final   Staphylococcus species NOT DETECTED NOT DETECTED Final   Staphylococcus aureus (BCID) NOT DETECTED NOT DETECTED  Final   Staphylococcus epidermidis NOT DETECTED NOT DETECTED Final   Staphylococcus lugdunensis NOT DETECTED NOT DETECTED Final   Streptococcus species NOT DETECTED NOT DETECTED Final   Streptococcus agalactiae NOT DETECTED NOT DETECTED Final   Streptococcus pneumoniae NOT DETECTED NOT DETECTED Final   Streptococcus pyogenes NOT DETECTED NOT DETECTED Final   A.calcoaceticus-baumannii DETECTED (A) NOT DETECTED Final    Comment: CRITICAL RESULT CALLED TO, READ BACK BY AND VERIFIED WITH: PHARMD G. ABBOTT 11/11/22 @ 0133 BY AB    Bacteroides fragilis NOT DETECTED NOT DETECTED Final   Enterobacterales NOT DETECTED NOT DETECTED Final   Enterobacter cloacae complex NOT DETECTED NOT DETECTED Final   Escherichia coli NOT DETECTED NOT DETECTED Final   Klebsiella aerogenes NOT DETECTED NOT DETECTED Final   Klebsiella oxytoca NOT DETECTED NOT DETECTED Final   Klebsiella pneumoniae NOT DETECTED NOT DETECTED Final   Proteus species NOT DETECTED NOT DETECTED Final   Salmonella species NOT DETECTED NOT DETECTED Final   Serratia marcescens NOT DETECTED NOT DETECTED Final   Haemophilus influenzae NOT DETECTED NOT DETECTED Final   Neisseria meningitidis NOT DETECTED NOT DETECTED Final   Pseudomonas aeruginosa NOT DETECTED NOT DETECTED Final   Stenotrophomonas maltophilia NOT DETECTED NOT DETECTED Final   Candida albicans NOT DETECTED NOT DETECTED Final   Candida auris NOT DETECTED NOT DETECTED Final   Candida glabrata NOT DETECTED NOT DETECTED Final   Candida krusei NOT DETECTED NOT DETECTED Final   Candida parapsilosis NOT DETECTED NOT DETECTED Final   Candida tropicalis NOT DETECTED NOT  DETECTED Final   Cryptococcus neoformans/gattii NOT DETECTED NOT DETECTED Final   CTX-M ESBL NOT DETECTED NOT DETECTED Final   Carbapenem resistance IMP NOT DETECTED NOT DETECTED Final   Carbapenem resistance KPC NOT DETECTED NOT DETECTED Final   Carbapenem resistance NDM NOT DETECTED NOT DETECTED Final   Carbapenem resistance VIM NOT DETECTED NOT DETECTED Final    Comment: Performed at [facility], [address], [place], [place] [address]  Blood culture (routine x 2)     Status: Abnormal   Collection Time: 11/10/22  2:25 PM   Specimen: BLOOD RIGHT HAND  Result Value Ref Range Status   Specimen Description BLOOD RIGHT HAND  Final   Special Requests   Final    BOTTLES DRAWN AEROBIC AND ANAEROBIC Blood Culture adequate volume   Culture  Setup Time   Final    GRAM NEGATIVE RODS IN BOTH AEROBIC AND ANAEROBIC BOTTLES CRITICAL VALUE NOTED.  VALUE IS CONSISTENT WITH PREVIOUSLY REPORTED AND CALLED VALUE.    Culture (A)  Final    ACINETOBACTER BAUMANNII SUSCEPTIBILITIES PERFORMED ON PREVIOUS CULTURE WITHIN THE LAST 5 DAYS. Performed at [facility], [address] [place], [place] [address]    Report Status 11/14/2022 FINAL  Final  SARS Coronavirus 2 by RT PCR (hospital order, performed in [facility] hospital lab) *cepheid single result test* Anterior Nasal Swab     Status: None   Collection Time: 11/10/22  3:51 PM   Specimen: Anterior Nasal Swab  Result Value Ref Range Status   SARS Coronavirus 2 by RT PCR NEGATIVE NEGATIVE Final    Comment: Performed at [facility], [address], [place], [place] [address]  Respiratory (~20 pathogens) panel by PCR     Status: Abnormal   Collection Time: 11/10/22  3:51 PM   Specimen: Nasopharyngeal Swab; Respiratory  Result Value Ref Range Status   Adenovirus NOT DETECTED NOT DETECTED Final   Coronavirus  229E NOT DETECTED NOT DETECTED Final    Comment: (NOTE) The Coronavirus on the Respiratory Panel, DOES NOT test for the  novel  Coronavirus (2019 nCoV)    Coronavirus HKU1 NOT DETECTED NOT DETECTED Final   Coronavirus NL63 NOT DETECTED NOT DETECTED Final   Coronavirus OC43 NOT DETECTED NOT DETECTED Final   Metapneumovirus NOT DETECTED NOT DETECTED Final   Rhinovirus / Enterovirus NOT DETECTED NOT DETECTED Final   Influenza A NOT DETECTED NOT DETECTED Final   Influenza B NOT DETECTED NOT DETECTED Final   Parainfluenza Virus 1 NOT DETECTED NOT DETECTED Final   Parainfluenza Virus 2 NOT DETECTED NOT DETECTED Final   Parainfluenza Virus 3 DETECTED (A) NOT DETECTED Final   Parainfluenza Virus 4 NOT DETECTED NOT DETECTED Final   Respiratory Syncytial Virus NOT DETECTED NOT DETECTED Final   Bordetella pertussis NOT DETECTED NOT DETECTED Final   Bordetella Parapertussis NOT DETECTED NOT DETECTED Final   Chlamydophila pneumoniae NOT DETECTED NOT DETECTED Final   Mycoplasma pneumoniae NOT DETECTED NOT DETECTED Final    Comment: Performed at [facility], [address] [place], [place] [address]  MRSA Next Gen by PCR, Nasal     Status: None   Collection Time: 11/10/22  3:59 PM   Specimen: Nasal Mucosa; Nasal Swab  Result Value Ref Range Status   MRSA by PCR Next Gen NOT DETECTED NOT DETECTED Final    Comment: (NOTE) The GeneXpert MRSA Assay (FDA approved for NASAL specimens only), is one component of a comprehensive MRSA colonization surveillance program. It is not intended to diagnose MRSA infection nor to guide or monitor treatment for MRSA infections. Test performance is not FDA approved in patients less than 64 years old. Performed at [facility], [address] [place], [place] [address]   Culture, blood (Routine X 2) w Reflex to ID Panel     Status: None (Preliminary result)   Collection Time: 11/13/22  4:05 PM   Specimen: BLOOD  Result Value Ref Range Status   Specimen Description BLOOD BLOOD RIGHT HAND  Final   Special Requests   Final    BOTTLES DRAWN AEROBIC AND ANAEROBIC Blood  Culture adequate volume   Culture   Final    NO GROWTH 2 DAYS Performed at [facility], [address] [place], [place] [address]    Report Status PENDING  Incomplete  Culture, blood (Routine X 2) w Reflex to ID Panel     Status: None (Preliminary result)   Collection Time: 11/13/22  4:15 PM   Specimen: BLOOD  Result Value Ref Range Status   Specimen Description BLOOD BLOOD LEFT HAND  Final   Special Requests   Final    BOTTLES DRAWN AEROBIC AND ANAEROBIC Blood Culture adequate volume   Culture   Final    NO GROWTH 2 DAYS Performed at [facility], [address] [place], [place] [address]    Report Status PENDING  Incomplete     Serology:   Imaging: If present, new imagings (plain films, ct scans, and mri) have been personally visualized and interpreted; radiology reports have been reviewed. Decision making incorporated into the Impression / Recommendations.   7/29 abd xray Feeding tube tip in pylorus  Raymondo Band, MD [facility] [phone] pager    11/15/2022, 3:20 PM

## 2022-11-15 NOTE — TOC Progression Note (Addendum)
Transition of Care [facility]) - Progression Note    Patient Details  Name: Donevan Clyne MRN: [id number] Date of Birth: June 25, 1955  [facility] CM/SW Contact  Ronny Bacon, RN Phone Number: 11/15/2022, 12:08 PM  Clinical Narrative:  Sherron Monday with Irving Burton- [facility]. Ready dialysis bed for patient today and is [facility] certified to administer patient medication. Attending made aware.   25 Spoke with step daughter Jae Dire [phone], to inform of bed available to [facility]. Step Daughter agrees with patient going to [facility]. Step daughter information given to Emily[facility] to reach out to finalize things.  1528 [facility] admission finalized, [facility]- [facility] spoke with step daughter and she is willing to help make decisions for patient. Patient to be transported by [facility] to [facility].         Expected Discharge Plan and Services                                               Social Determinants of Health (SDOH) Interventions SDOH Screenings   Food Insecurity: No Food Insecurity (10/17/2022)  Housing: Patient Unable To Answer (10/17/2022)  Tobacco Use: High Risk (11/10/2022)    Readmission Risk Interventions    03/17/2020    1:48 PM  Readmission Risk Prevention Plan  Transportation Screening Complete  PCP or Specialist Appt within 3-5 Days --  HRI or Home Care Consult --  Palliative Care Screening Not Applicable  Medication Review (RN Care Manager) Complete

## 2022-11-15 NOTE — Progress Notes (Signed)
[facility] Progress Note   Subjective: seen in ICU.  On 6L [place].  Off pressors.  For HD today.  Having hallucinations and agitation; restraints.         Vitals:    11/13/22 0645 11/13/22 0700 11/13/22 0714 11/13/22 0800  BP: 111/70 112/68   (!) 150/57  Pulse: 75 73   71  Resp: (!) 28 (!) 23   19  Temp:     (!) 97.2 F (36.2 C)    TempSrc:     Axillary    SpO2: 95% 93%   100%  Weight:          Height:              Exam: Gen awake, combative, [place] O2 Neck no jvd Chest coarse rhonchi bilat RRR no RG Abd soft ntnd no mass or ascites +bs Ext diffuse 2-3+ pitting bilat LE edema up to hips Neuro awake alert, yelling, cursing and taking swings    RIJ TDC       Home meds- tylenol, ventolin, maalox, pepto bismol, buspar bid, clozapine bid, depakote er, cardura 4mg  every day, robitussin, synthroid, imodium, MOM, antivert, renavite, nicotine patch, nepro supps, protonix, senokot-s, desyrel, ingrezza, b-12, vit D      OP HD: [facility] MWF  3.5h   400/300  97.7kg   2/2.25 bath  RIJ TDC  Heparin 2000 - last and only OP HD was on 7/22, post wt 98.4kg - venofer 100mg  IV q HD thru 8/16 - no esa or vdra     Assessment/ Plan: Acute hypoxic resp failure - due to infection/ multifocal pna. Sig improvement, off Bipap and on 6 L [place] today.  Hypotension - shock, due to infection/ pna. Off pressors x >24 hrs now. On midodrine.  Parainfluenza infection / bact PNA - RVP + for parainfluenza and blood cx's + for acinetobacter baumannii. On IV and oral abx now.  Will need TDC exchange at some point - afebrile for now and repeat cultures NGTD so can hold off for today.  Severe AKI / dialysis dependent - due to acute rhabdo, started HD 10/23/22 at [facility]. Pt is not esrd yet. Last OP HD was 7/22. Did not tolerate HD here on 7/24. Had HD here Friday and Sat. Next HD today.  Volume - marked LE edema, off pressor support now. Had HD Friday w/ min UF, extra HD Sat w/ 2.5 L off. HD today, cont to lower vol as  tolerated. BP's improving.  Atrial fib - improved, now in NSR Anemia esrd - Hb 8- 9 here, getting IV Fe in OP setting but will hold off due to infection. Was not on esa at OP unit -> started esa here w/ darbe 60 mcg sq weekly.  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 No vdra or binder listed. Follow.  Schizoaffective d/o - is on multiple psychiatric meds  Hypothyroidism  DNI / DNR  Estill Bakes MD [facility] Pager [phone]    Last Labs        Recent Labs  Lab 11/10/22 1336 11/10/22 1348 11/11/22 0102 11/12/22 0902  HGB 8.0*   < > 8.0* 7.8*  ALBUMIN 2.0*  --   --  2.0*  CALCIUM 8.4*  --  8.2* 8.1*  PHOS  --    < > 5.0* 6.0*  CREATININE 3.81*   < > 3.58* 3.96*  K 3.9   < > 3.7 4.8   < > = values in this  interval not displayed.      Last Labs  No results for input(s): "IRON", "TIBC", "FERRITIN" in the last 168 hours.   Inpatient medications:  Chlorhexidine Gluconate Cloth  6 each Topical Daily   darbepoetin (ARANESP) injection - DIALYSIS  60 mcg Subcutaneous Q Wed-1800   heparin  5,000 Units Subcutaneous Q8H   midodrine  10 mg Oral TID WC   nicotine  14 mg Transdermal Daily   mouth rinse  15 mL Mouth Rinse 4 times per day         ampicillin-sulbactam (UNASYN) IV Stopped (11/12/22 2324)   dexmedetomidine (PRECEDEX) IV infusion 0.2 mcg/kg/hr (11/13/22 0700)   dextrose 5 % and 0.9 % NaCl Stopped (11/12/22 0824)   diltiazem (CARDIZEM) infusion Stopped (11/13/22 9629)   norepinephrine (LEVOPHED) Adult infusion 2 mcg/min (11/13/22 0700)        docusate, heparin, ipratropium-albuterol, mouth rinse, polyethylene glycol

## 2022-11-15 NOTE — Progress Notes (Signed)
Dear Doctor: This patient has been identified as a candidate for PICC /CVC for the following reason (s): IV therapy over 48 hours, drug pH or osmolality (causing phlebitis, infiltration in 24 hours), poor veins/poor circulatory system (CHF, COPD, emphysema, diabetes, steroid use, IV drug abuse, etc.), restarts due to phlebitis and infiltration in 24 hours, and incompatible drugs (aminophyllin, TPN, heparin, given with an antibiotic) If you agree, please write an order for the indicated device.    Thank you for supporting the early vascular access assessment program.  Patient has had multiple infiltrations since admission bilateral FAs.

## 2022-11-15 NOTE — Progress Notes (Signed)
Speech Language Pathology Treatment: Dysphagia  Patient Details Name: Austin Marsh MRN: [id number] DOB: 1956/03/23 Today's Date: 11/15/2022 Time: 7829-5621 SLP Time Calculation (min) (ACUTE ONLY): 10 min  Assessment / Plan / Recommendation Clinical Impression  Pt with improvements from yesterday in regards to being able to consume po's. He was alert and eager for applesauce and ice chips after repositioning and oral care from RN as SLP entered room. Mentation is altered likely from history of his schizoaffective with bipolar disorder exacerbated by current illness as some verbalizations were irrelevant and random. His initiation of swallow with applesauce appeared timely but immediate wheezing/congestion was audible. Soon after consumption of applesauce he had hard, continuous coughs that continued throughout the session indicative of suspected airway compromise and he is not ready for instrumental evaluation at this time if coughing with applesauce viscosity. He currently has a 14 [place] NGT (per dietitian) which is planned to be replaced with a Cortrak. ST will continue to follow.    HPI HPI: Patient is a 67 y.o. male with PMH: COPD, ESRD recently started on HD, schizoaffective with bipolar disorder, depression/anxiety,, hypothyroidism,, tobacco abuse, OSA on CPAP. He presented to [facility] ED on 11/10/22 with hypoxic respiratory failure. He was recently admitted 10/17/22 to [facility] for lethargy and found to have acute urinary retention and severe AKI on CKD 3a requiring dialysis. EMS put patient on NRB secondary to sats at 70's%. Patient was placed on BiPAP in ED, RR in 30's.  CXR w/ opacities b/l concerning for mulilobar pneumonia. He has had agitation in spite of Precedex, requiring restraints. He has been NPO.      SLP Plan  Continue with current plan of care      Recommendations for follow up therapy are one component of a multi-disciplinary discharge planning process, led by the attending physician.   Recommendations may be updated based on patient status, additional functional criteria and insurance authorization.    Recommendations  Diet recommendations: NPO Medication Administration: Via alternative means                  Oral care QID   Frequent or constant Supervision/Assistance Dysphagia, unspecified (R13.10)     Continue with current plan of care     Royce Macadamia  11/15/2022, 11:53 AM

## 2022-11-15 NOTE — Consult Note (Signed)
Consultation Note Date: 11/15/2022   Patient Name: Austin Marsh  DOB: 1955-07-15  MRN: [id number]  Age / Sex: 67 y.o., male  PCP: Koren Bound, NP Referring Physician: Chilton Greathouse, MD  Reason for Consultation: {Reason for Consult:23484}  HPI/Patient Profile: 67 y.o. male  with past medical history of *** admitted on 11/10/2022 with ***.   Clinical Assessment and Goals of Care: ***  {Primary Decision GMWNU:27253}    SUMMARY OF RECOMMENDATIONS   *** Code Status/Advance Care Planning: {Palliative Code status:23503}   Symptom Management:  ***  Palliative Prophylaxis:  {Palliative Prophylaxis:21015}  Additional Recommendations (Limitations, Scope, Preferences): {Recommended Scope and Preferences:21019}  Psycho-social/Spiritual:  Desire for further Chaplaincy support:{YES NO:22349} Additional Recommendations: {PAL SOCIAL:21064}  Prognosis:  {Palliative Care Prognosis:23504}  Discharge Planning: {Palliative dispostion:23505}      Primary Diagnoses: Present on Admission:  Acute respiratory failure with hypoxia (HCC)   I have reviewed the medical record, interviewed the patient and family, and examined the patient. The following aspects are pertinent.  Past Medical History:  Diagnosis Date   COPD (chronic obstructive pulmonary disease) (HCC)    Schizoaffective disorder, bipolar type (HCC)    Sleep apnea    Tardive dyskinesia    Thyroid disease    Social History   Socioeconomic History   Marital status: Married    Spouse name: Not on file   Number of children: Not on file   Years of education: Not on file   Highest education level: Not on file  Occupational History   Not on file  Tobacco Use   Smoking status: Every Day    Types: Cigarettes   Smokeless tobacco: Never  Substance and Sexual Activity   Alcohol use: Not Currently   Drug use: Not Currently   Sexual  activity: Not Currently  Other Topics Concern   Not on file  Social History Narrative   Not on file   Social Determinants of Health   Financial Resource Strain: Not on file  Food Insecurity: No Food Insecurity (10/17/2022)   Hunger Vital Sign    Worried About Running Out of Food in the Last Year: Never true    Ran Out of Food in the Last Year: Never true  Transportation Needs: Not on file  Physical Activity: Not on file  Stress: Not on file  Social Connections: Not on file   History reviewed. No pertinent family history. Scheduled Meds:  benztropine  2 mg Per Tube BID   busPIRone  15 mg Per Tube BID   Chlorhexidine Gluconate Cloth  6 each Topical Q0600   cloZAPine  100 mg Per Tube Daily   cloZAPine  200 mg Per Tube QHS   darbepoetin (ARANESP) injection - DIALYSIS  60 mcg Subcutaneous Q Wed-1800   feeding supplement (PROSource TF20)  60 mL Per Tube Daily   heparin  5,000 Units Subcutaneous Q8H   levothyroxine  75 mcg Per Tube Q0600   midodrine  10 mg Per Tube Q8H   minocycline  200 mg Per Tube Q12H  multivitamin  1 tablet Per Tube QHS   nicotine  14 mg Transdermal Daily   mouth rinse  15 mL Mouth Rinse 4 times per day   polyethylene glycol  17 g Per Tube Daily   senna-docusate  1 tablet Per Tube Daily   valbenazine  80 mg Oral Daily   Valproate Sodium  500 mg Per Tube BID   Continuous Infusions:  ampicillin-sulbactam (UNASYN) IV 3 g (11/15/22 0933)   dextrose 5 % and 0.9 % NaCl 10 mL/hr at 11/15/22 0900   feeding supplement (OSMOLITE 1.5 CAL)     PRN Meds:.docusate, heparin, ipratropium-albuterol, OLANZapine, mouth rinse, oxyCODONE, polyethylene glycol Medications Prior to Admission:  Prior to Admission medications   Medication Sig Start Date End Date Taking? Authorizing Provider  acetaminophen (TYLENOL) 325 MG tablet Take 650 mg by mouth every 4 (four) hours as needed for moderate pain.   Yes [provider]  albuterol (VENTOLIN HFA) 108 (90 Base) MCG/ACT  inhaler Inhale 2 puffs into the lungs every 4 (four) hours as needed for wheezing or shortness of breath. 01/23/20  Yes Shaune Pollack, MD  alum & mag hydroxide-simeth (MAALOX/MYLANTA) 200-200-20 MG/5ML suspension Take 30 mLs by mouth daily as needed for indigestion or heartburn.   Yes [provider]  benztropine (COGENTIN) 2 MG tablet Take 2 mg by mouth 2 (two) times daily.   Yes [provider]  bismuth subsalicylate (PEPTO BISMOL) 262 MG/15ML suspension Take 15 mLs by mouth every 4 (four) hours as needed.   Yes [provider]  busPIRone (BUSPAR) 15 MG tablet Take 15 mg by mouth 2 (two) times daily.   Yes [provider]  cloZAPine (CLOZARIL) 100 MG tablet Take 100-200 mg by mouth See admin instructions. Take 100 mg by mouth in the morning and 200 mg in the evening. 200 mg qpm   Yes [provider]  divalproex (DEPAKOTE ER) 500 MG 24 hr tablet Take 1,000 mg by mouth at bedtime.   Yes [provider]  doxazosin (CARDURA) 4 MG tablet Take 4 mg by mouth daily.   Yes [provider]  guaiFENesin (ROBITUSSIN) 100 MG/5ML liquid Take 200 mg by mouth every 6 (six) hours as needed for cough.   Yes [provider]  levothyroxine (SYNTHROID) 75 MCG tablet Take 75 mcg by mouth daily before breakfast.   Yes [provider]  loperamide (IMODIUM) 2 MG capsule Take 2 mg by mouth every 4 (four) hours as needed for diarrhea or loose stools.   Yes [provider]  magnesium hydroxide (MILK OF MAGNESIA) 400 MG/5ML suspension Take 30 mLs by mouth daily as needed for mild constipation. 325 mg every 30 hours as needed for constipation   Yes [provider]  meclizine (ANTIVERT) 25 MG tablet Take 25 mg by mouth 3 (three) times daily as needed for dizziness.   Yes [provider]  multivitamin (RENA-VIT) TABS tablet Take 1 tablet by mouth at bedtime. 11/04/22  Yes Darlin Priestly, MD  nicotine (NICODERM CQ - DOSED IN MG/24  HOURS) 21 mg/24hr patch Place 1 patch (21 mg total) onto the skin daily. 11/05/22  Yes Darlin Priestly, MD  Nutritional Supplements (FEEDING SUPPLEMENT, NEPRO CARB STEADY,) LIQD Take 237 mLs by mouth 3 (three) times daily between meals. 11/04/22  Yes Darlin Priestly, MD  pantoprazole (PROTONIX) 40 MG tablet Take 40 mg by mouth daily.   Yes [provider]  sennosides-docusate sodium (SENOKOT-S) 8.6-50 MG tablet Take 1 tablet by mouth in  the morning and at bedtime.   Yes [provider]  valbenazine (INGREZZA) 80 MG capsule Take 80 mg by mouth daily.   Yes [provider]  vitamin B-12 (CYANOCOBALAMIN) 100 MCG tablet Take 100 mcg by mouth daily.   Yes [provider]  Vitamin D, Cholecalciferol, 25 MCG (1000 UT) TABS Take 50 mcg by mouth daily.    Yes [provider]  epoetin alfa (EPOGEN) 10000 UNIT/ML injection Inject 1 mL (10,000 Units total) into the vein every Monday, Wednesday, and Friday with hemodialysis. Patient not taking: Reported on 11/10/2022 11/05/22   Darlin Priestly, MD   Allergies  Allergen Reactions   Haldol [Haloperidol Lactate] Other (See Comments)    Tardive diskonesia   Other     Navy Beans cause patient to vomit   Thorazine [Chlorpromazine] Other (See Comments)    "messes me all up"   Trazodone And Nefazodone Other (See Comments)    "makes me feel like I'm dying."   Review of Systems  Physical Exam  Vital Signs: BP (!) 107/50   Pulse 85   Temp 97.9 F (36.6 C) (Axillary)   Resp (!) 22   Ht 5\' 9"  (1.753 m)   Wt 91.3 kg   SpO2 95%   BMI 29.72 kg/m  Pain Scale: CPOT   Pain Score: 0-No pain   SpO2: SpO2: 95 % O2 Device:SpO2: 95 % O2 Flow Rate: .O2 Flow Rate (L/min): 6 L/min  IO: Intake/output summary:  Intake/Output Summary (Last 24 hours) at 11/15/2022 1156 Last data filed at 11/15/2022 0900 Gross per 24 hour  Intake 633.06 ml  Output 300 ml  Net 333.06 ml    LBM: Last BM Date :  (pta) Baseline Weight: Weight: 101.2  kg Most recent weight: Weight: 91.3 kg     Palliative Assessment/Data:     Time In: *** Time Out: *** Time Total: *** Greater than 50%  of this time was spent counseling and coordinating care related to the above assessment and plan.  Signed by: Lorinda Creed, NP   Please contact Palliative Medicine Team phone at [phone] for questions and concerns.  For individual provider: See Loretha Stapler

## 2022-11-15 NOTE — Consult Note (Cosign Needed Addendum)
[facility] Consult Evaluation  Service Date: November 15, 2022 LOS:  LOS: 5 days    Primary Psychiatric Diagnoses  Schizoaffective disorder, bipolar type  Assessment  Reyhan Weyker is a 67 y.o. male admitted medically on 11/10/2022  1:23 PM for acute hypoxic respiratory failure due to multifocal pneumonia, septic shock. Past psychiatric history is significant for schizoaffective d/o bipolar type, TD, and on clozapine since 2008 per chart review.  Past medical history is significant for OSA on CPAP, BPH, CKD-3A on HD, COPD, and hypothyroidism. Psychiatry was consulted for "Schizoaffective disorder, bipolar type, depression with anxiety" by Chilton Greathouse, MD on 11/15/2022.  Patient has the established diagnosis of schizoaffective disorder bipolar type, treated with clozaril for over 15 years, who has been staying at family care facility . Unfortunately, we were unsuccessful in obtaining a detailed psychiatric history, as patient's family has had limited contact with him and are unfamiliar with his past medical/psychiatric history. Psychiatric hx is limited on chart review. The timeline of his TD is unclear. Mental status examination is limited as patient is unable to participate and answer the majority of questions. He appeared restless, there is repetitive involuntary movement consistent with TD. There is no evidence of an acute psychiatric disturbance. Per primary team, plan is to discharge patient tomorrow to [facility] (confirmed they are [facility] certified). Clozaril was reduced to 100 mg BID to minimize anticholinergic effects, with goal of titrating back to home dose as tolerated.   Diagnoses:  Active Hospital problems: Principal Problem:   Acute respiratory failure with hypoxia (HCC) Active Problems:   Bacteremia   Multifocal pneumonia   Malnutrition of moderate degree     Plan   ## Psychiatric Medication Recommendations:  -- Clozaril has been decreased to 100 mg BID --  Continue ingrezza and depakene   ## Medical Decision Making Capacity:  Capacity was not formally addressed during this encounter.  ## Further Work-up:  -- Per primary While pt on Qtc prolonging medications, please monitor & replete K+ to 4 and Mg2+ to 2 -- most recent EKG on 11/13/2022 had QtC of 505  ## Disposition:  -- There are no current psychiatric contraindications to discharge at this time  ## Behavioral / Environmental:  --  Delirium Precautions:  Delirium Interventions for Nursing and Staff: - RN to open blinds every AM. - To Bedside: Glasses, hearing aide, and pt's own shoes. Make available to patients. when possible and encourage use. - Encourage po fluids when appropriate, keep fluids within reach. - OOB to chair with meals. - Passive ROM exercises to all extremities with AM & PM care. - RN to assess orientation to person, time and place QAM and PRN. - Recommend extended visitation hours with familiar family/friends as feasible. - Staff to minimize disturbances at night. Turn off television when pt asleep or when not in use.    ## Safety and Observation Level:  - Based on my clinical evaluation, I estimate the patient to be at moderate risk of self harm in the current setting.  - At this time, we recommend a routine level of observation. This decision is based on my review of the chart including patient's history and current presentation, interview of the patient, mental status examination, and consideration of suicide risk including evaluating suicidal ideation, plan, intent, suicidal or self-harm behaviors, risk factors, and protective factors. This judgment is based on our ability to directly address suicide risk, implement suicide prevention strategies and develop a safety plan while the patient  is in the clinical setting. Please contact our team if there is a concern that risk level has changed.  Suicide risk assessment  Patient has following modifiable risk factors  for suicide: chronic illnesses management, which we are addressing by optimizing treatment plans, ensuring adherence to medications, and coordinating with specialists to manage comorbid conditions effectively.   Patient has following non-modifiable or demographic risk factors for suicide: male gender  Patient has the following protective factors against suicide: Access to outpatient mental health care   Thank you for this consult request. Recommendations have been communicated to the primary team.  We will sign off at this time as patient is discharging today.   Lorri Frederick, MD  Psychiatric and Social History   Relevant Aspects of Hospital Course:  Admitted on 11/10/2022 for acute hypoxic respiratory failure due to multifocal pneumonia, septic shock.  Patient Report:  Patient's subjective report is limited by poor speech, as it garbled and difficult to understand. He opens eyes upon request. He responds yes when asked to confirm his name. He verbalizes yes when asked if I can speak with his family.   Psychiatric ROS Mood Symptoms Unable to assess  Manic Symptoms Unable to assess  Anxiety Symptoms Unable to assess  Trauma Symptoms Unable to assess  Psychosis Symptoms Unable to assess    Collateral information:  Spoke with brother in law Harvie Heck, in person on 11/15/2022 Patient's brother-in-law and sister-in-law are not familiar with patient's past psychiatric history.  They believe patient has had a history of schizophrenia spectrum disorder as long as they have known the patient, patient has been married to Randy's sister for the past 30 years. At baseline they describe him as "bossy, quirky, unpleasant, and a jerk". Patient's wife is current at [facility], who also appears to have a schizophrenia spectrum disorder.   Contacted stepdaughter, Lamar Laundry, at [phone] on 11/15/2022 Lamar Laundry is not familiar with patient's past psychiatric history. Reports she did not grow  up with patient and has maintained limited contact with him. Confirms she is not patient's POA, alludes to a court appointed legal guardian but is unaware of any additional details.    Psychiatric History:  Obtained for chart review. Patient has past dx of schizoaffective d/o bipolar type, TD, and clozapine use that dates back to 2008.  Prior ECT: None identified on chart review Prior Psych Hospitalization: There is admission to [facility] on 03/15/2007  Prior Self Harm: None identified on chart review Prior Violence: None identified on chart review  Family Psych History: Unable to assess  Family Hx suicide: Unable to assess   Social History:  Developmental Hx: Unable to assess  Educational Hx: Unable to assess  Occupational Hx: None identified on chart review Legal Hx: None identified on chart review Living Situation: Unable to assess  Spiritual Hx: Unable to assess  Access to weapons: None    Tobacco use: Unable to assess  Alcohol use: Unable to assess  Drug use: Unable to assess    Exam Findings   Psychiatric Specialty Exam:  Presentation  General Appearance: Appropriate for Environment  Eye Contact:Poor  Speech:Garbled  Speech Volume:Other (comment) (limited communication.)  Handedness:Right   Mood and Affect  Mood:No data recorded Affect:Flat   Thought Process  Thought Processes:-- (Unable to formally assess)  Descriptions of Associations:Loose  Orientation:Partial  Thought Content:-- (Unable to formally assess)  History of Schizophrenia/Schizoaffective disorder:Yes  Duration of Psychotic Symptoms:Greater than six months  Hallucinations:Hallucinations: -- (Unable to formally assess)  Ideas of Reference:-- (  Unable to formally assess)  Suicidal Thoughts:Suicidal Thoughts: -- (Unable to formally assess)  Homicidal Thoughts:Homicidal Thoughts: -- (Unable to formally assess)   Sensorium  Memory:Immediate Poor; Recent Poor; Remote Poor  Judgment:--  (Unable to formally assess)  Insight:-- (Unable to formally assess)   Art therapist  Concentration:-- (Unable to formally assess)  Attention Span:-- (Unable to formally assess)  Recall:-- (Unable to formally assess)  Fund of Knowledge:-- (Unable to formally assess)  Language:-- (Unable to formally assess)   Psychomotor Activity  Psychomotor Activity:Psychomotor Activity: Increased; Restlessness; Extrapyramidal Side Effects (EPS) Extrapyramidal Side Effects (EPS): Tardive Dyskinesia AIMS Completed?: No   Assets  Assets:Intimacy; Social Support; Talents/Skills   Sleep  Sleep:Sleep: -- (Unable to formally assess)    Physical Exam: Vital signs:  Temp:  [97.8 F (36.6 C)-98.5 F (36.9 C)] 97.9 F (36.6 C) (07/29 1545) Pulse Rate:  [65-95] 80 (07/29 1500) Resp:  [7-29] 15 (07/29 1500) BP: (85-145)/(48-72) 97/51 (07/29 1500) SpO2:  [85 %-100 %] 96 % (07/29 1500) Weight:  [91.3 kg] 91.3 kg (07/29 0131) Physical Exam Constitutional:      General: He is not in acute distress.    Appearance: He is not ill-appearing.  HENT:     Head: Normocephalic and atraumatic.  Pulmonary:     Effort: Pulmonary effort is normal. No respiratory distress.  Skin:    General: Skin is warm and dry.  Neurological:     General: No focal deficit present.     Mental Status: He is oriented to person, place, and time.     Blood pressure (!) 97/51, pulse 80, temperature 97.9 F (36.6 C), temperature source Axillary, resp. rate 15, height 5\' 9"  (1.753 m), weight 91.3 kg, SpO2 96%. Body mass index is 29.72 kg/m.   Other History   These have been pulled in through the EMR, reviewed, and updated if appropriate.   Family History:  The patient's family history is not on file.  Medical History: Past Medical History:  Diagnosis Date   COPD (chronic obstructive pulmonary disease) (HCC)    Schizoaffective disorder, bipolar type (HCC)    Sleep apnea    Tardive dyskinesia    Thyroid  disease     Surgical History: Past Surgical History:  Procedure Laterality Date   APPENDECTOMY     DIALYSIS/PERMA CATHETER INSERTION N/A 10/27/2022   Procedure: DIALYSIS/PERMA CATHETER INSERTION;  Surgeon: Annice Needy, MD;  Location: [facility] INVASIVE CV LAB;  Service: Cardiovascular;  Laterality: N/A;    Medications:   Current Facility-Administered Medications:    Ampicillin-Sulbactam (UNASYN) 3 g in sodium chloride 0.9 % 100 mL IVPB, 3 g, Intravenous, BID, Paliwal, Aditya, MD, Stopped at 11/15/22 1003   benztropine (COGENTIN) tablet 2 mg, 2 mg, Per Tube, BID, Cyril Mourning V, MD, 2 mg at 11/15/22 1048   busPIRone (BUSPAR) tablet 15 mg, 15 mg, Per Tube, BID, Oretha Milch, MD, 15 mg at 11/15/22 0919   Chlorhexidine Gluconate Cloth 2 % PADS 6 each, 6 each, Topical, Q0600, Delano Metz, MD, 6 each at 11/14/22 2030   cloZAPine (CLOZARIL) tablet 100 mg, 100 mg, Per Tube, Daily, Cyril Mourning V, MD, 100 mg at 11/15/22 1049   cloZAPine (CLOZARIL) tablet 100 mg, 100 mg, Per Tube, QHS, Carrion-Carrero, Roxanne Panek, MD   Darbepoetin Alfa (ARANESP) injection 60 mcg, 60 mcg, Subcutaneous, Q Wed-1800, Delano Metz, MD, 60 mcg at 11/10/22 2030   dextrose 5 %-0.9 % sodium chloride infusion, , Intravenous, Continuous, Lockie Mola, MD, Last Rate: 10 mL/hr at 11/15/22  1500, Infusion Verify at 11/15/22 1500   docusate (COLACE) 50 MG/5ML liquid 100 mg, 100 mg, Per Tube, BID PRN, Lidia Collum, PA-C, 100 mg at 11/14/22 2101   feeding supplement (OSMOLITE 1.5 CAL) liquid 1,000 mL, 1,000 mL, Per Tube, Continuous, Mannam, Praveen, MD, Last Rate: 25 mL/hr at 11/15/22 1500, Infusion Verify at 11/15/22 1500   feeding supplement (PROSource TF20) liquid 60 mL, 60 mL, Per Tube, Daily, Mannam, Praveen, MD, 60 mL at 11/15/22 1449   heparin injection 2,000 Units, 2,000 Units, Dialysis, PRN, Delano Metz, MD   heparin injection 5,000 Units, 5,000 Units, Subcutaneous, Q8H, Mosetta Anis, RPH, 5,000 Units at  11/15/22 1423   ipratropium-albuterol (DUONEB) 0.5-2.5 (3) MG/3ML nebulizer solution 3 mL, 3 mL, Nebulization, Q4H PRN, Suzie Portela, John D, PA-C   levothyroxine (SYNTHROID) tablet 75 mcg, 75 mcg, Per Tube, Q0600, Mannam, Praveen, MD, 75 mcg at 11/15/22 1059   midodrine (PROAMATINE) tablet 10 mg, 10 mg, Per Tube, Q8H, Cyril Mourning V, MD, 10 mg at 11/15/22 1423   multivitamin (RENA-VIT) tablet 1 tablet, 1 tablet, Per Tube, QHS, Mannam, Praveen, MD   nicotine (NICODERM CQ - dosed in mg/24 hours) patch 14 mg, 14 mg, Transdermal, Daily, Lidia Collum, PA-C, 14 mg at 11/15/22 0917   OLANZapine (ZYPREXA) injection 5 mg, 5 mg, Intravenous, Q6H PRN, Paliwal, Aditya, MD, 5 mg at 11/15/22 1301   Oral care mouth rinse, 15 mL, Mouth Rinse, 4 times per day, Leslye Peer, MD, 15 mL at 11/15/22 1708   Oral care mouth rinse, 15 mL, Mouth Rinse, PRN, Leslye Peer, MD   oxyCODONE (ROXICODONE) 5 MG/5ML solution 5 mg, 5 mg, Per Tube, Q6H PRN, Paliwal, Aditya, MD, 5 mg at 11/15/22 1423   polyethylene glycol (MIRALAX / GLYCOLAX) packet 17 g, 17 g, Per Tube, Daily PRN, Pia Mau D, PA-C   polyethylene glycol (MIRALAX / GLYCOLAX) packet 17 g, 17 g, Per Tube, Daily, Mannam, Praveen, MD, 17 g at 11/15/22 1000   senna-docusate (Senokot-S) tablet 1 tablet, 1 tablet, Per Tube, Daily, Mannam, Praveen, MD, 1 tablet at 11/15/22 1047   sterile water (preservative free) injection, , , ,    valbenazine (INGREZZA) capsule 80 mg, 80 mg, Oral, Daily, Cyril Mourning V, MD, 80 mg at 11/15/22 1117   Valproate Sodium (DEPAKENE) solution 500 mg, 500 mg, Per Tube, BID, Oretha Milch, MD, 500 mg at 11/15/22 8295  Allergies: Allergies  Allergen Reactions   Haldol [Haloperidol Lactate] Other (See Comments)    Tardive diskonesia   Other     Navy Beans cause patient to vomit   Thorazine [Chlorpromazine] Other (See Comments)    "messes me all up"   Trazodone And Nefazodone Other (See Comments)    "makes me feel like I'm dying."

## 2022-11-15 NOTE — Progress Notes (Signed)
Discussed infiltrations and instructed nurse to removed PIVs notify pharmacy and MDs as needed. Also instructed to document the infiltrations on the bilateral FAs. Instructed upper PIVs not to be used for vasopressor and to notify MD of CVC line if vasopressor infusions restarted. Tomasita Morrow, RN VAST

## 2022-11-15 NOTE — TOC Transition Note (Addendum)
Transition of Care [facility]) - CM/SW Discharge Note   Patient Details  Name: Austin Marsh MRN: [id number] Date of Birth: August 16, 1955  Transition of Care [facility] CM/SW Contact:  Ronny Bacon, RN Phone Number: 11/15/2022, 3:42 PM   Clinical Narrative:  Patient being discharged to [facility] to a dialysis bed. Emily-[facility] completed arrangements with step daughter. [facility] to transport at discharge, floor nurse to call when patient is ready to go.  Report to be called to [phone] or [phone]. He is going to room 327 and admitting provider is Dr. Nelson Chimes.  [address] Floor nurse reports patient did not have dialysis today and probaly will not have it until either 7pm or 11 pm tonight. Plan is for patient to be transferred to [facility] in the morning. Irving Burton- [facility] made aware.    Final next level of care: Long Term Acute Care (LTAC) Barriers to Discharge: No Barriers Identified   Patient Goals and CMS Choice      Discharge Placement                         Discharge Plan and Services Additional resources added to the After Visit Summary for                                       Social Determinants of Health (SDOH) Interventions SDOH Screenings   Food Insecurity: No Food Insecurity (10/17/2022)  Housing: Patient Unable To Answer (10/17/2022)  Tobacco Use: High Risk (11/10/2022)     Readmission Risk Interventions    03/17/2020    1:48 PM  Readmission Risk Prevention Plan  Transportation Screening Complete  PCP or Specialist Appt within 3-5 Days --  HRI or Home Care Consult --  Palliative Care Screening Not Applicable  Medication Review (RN Care Manager) Complete

## 2022-11-15 NOTE — TOC Progression Note (Addendum)
Transition of Care [facility]) - Progression Note    Patient Details  Name: Alston Nestor MRN: [id number] Date of Birth: Mar 30, 1956  Transition of Care [facility]) CM/SW Contact  Mearl Latin, LCSW Phone Number: 11/15/2022, 3:09 PM  Clinical Narrative:    12pm-CSW received consult that patient's step daughter stated patient has pending APS guardianship. CSW contacted [facility] DSS to determine how to contact patient's assigned worker; voicemail left. CSW attempted to call Renea Ee. With [place] DSS as her info is on the last request for records form [phone]) but voicemail box is full. CSW contacted her supervisor, Jhonnie Garner [phone]) and his voicemail said to contact [phone] and ask for the on call social worker for guardianship. CSW contacted that number and they provided me with the on call SW number [phone]. CSW provided patient's name and that person sent me to a voicemail for [employer]. CSW left a voicemail.   5pm-CSW received call from [facility] [phone]) from APS. She confirmed they are petitioning for Guardianship but have not completed the paperwork yet. CSW provided an update on dc plan to [facility] tomorrow and provided follow up numbers. She will contact Sonya for follow up.        Expected Discharge Plan and Services                                               Social Determinants of Health (SDOH) Interventions SDOH Screenings   Food Insecurity: No Food Insecurity (10/17/2022)  Housing: Patient Unable To Answer (10/17/2022)  Tobacco Use: High Risk (11/10/2022)    Readmission Risk Interventions    03/17/2020    1:48 PM  Readmission Risk Prevention Plan  Transportation Screening Complete  PCP or Specialist Appt within 3-5 Days --  HRI or Home Care Consult --  Palliative Care Screening Not Applicable  Medication Review (RN Care Manager) Complete

## 2022-11-15 NOTE — Progress Notes (Signed)
Initial Nutrition Assessment  DOCUMENTATION CODES:   Non-severe (moderate) malnutrition in context of chronic illness  INTERVENTION:   - Recommend exchanging NG tube for Cortrak and bridle  Initiate enteral nutrition via NG tube: - Start Osmolite 1.5 @ 25 ml/hr and titrate by 10 ml/hr every 8 hours to goal rate of 55 ml/hr (1320 ml/day) - PROSource TF20 60 ml daily  Tube feeding regimen at goal rate provides 2060 kcal, 103 grams of protein, and 1006 ml of H2O.  Monitor magnesium, potassium, and phosphorus every 12 hours for at least 6 occurrences. MD to replete as needed, as pt is at risk for refeeding syndrome.  - Renal MVI daily per tube  NUTRITION DIAGNOSIS:   Moderate Malnutrition related to chronic illness (COPD, ESRD/HD) as evidenced by mild fat depletion, severe muscle depletion.  GOAL:   Patient will meet greater than or equal to 90% of their needs  MONITOR:   Diet advancement, Labs, Weight trends, TF tolerance, I & O's  REASON FOR ASSESSMENT:   Consult Enteral/tube feeding initiation and management  ASSESSMENT:   67 year old male who presented to the ED from HD on 7/24 with SOB and hypoxia. PMH of COPD, schizoaffective/bipolar disorder, hypothyroidism, OSA on CPAP, ESRD on HD. Pt with recent admission to [facility] with RLL PNA, urinary retention, shock, and rhabdomyolysis. Pt admitted with acute respiratory failure due to multifocal PNA, septic shock due to PNA and bacteremia.  07/28 - SLP evaluation with recommendations for NPO, NG tube placed  Consult received for enteral nutrition initiation and management. A 14 French NG tube is in stomach per abdominal x-ray yesterday. Pt is off BiPAP and high flow, currently on 6 L nasal cannula. Pt is off pressors. Pt remains NPO. SLP following for potential diet advancement.  Unable to obtain diet and weight history at this time. Current weight is below EDW on 97.7 kg. Reviewed weight history in chart. Pt with a 10.2 kg weight  loss since 11/04/22. This is a 10% weight loss in < 1 month which is severe and significant for timeframe. Difficult to determine how much of weight loss is attributable to volume status given pt is on HD. However, suspect significant true dry weight loss is present given findings of fat and muscle depletion on NFPE. Pt meets criteria for moderate chronic malnutrition at this time.  Pt is at risk for refeeding syndrome. Pt has been without nutrition since admission 7/24. Pt has been on D5-NS but only at 10 ml/hr since 7/25.  Last iHD was on 7/28 with 2500 ml net UF. Pt with moderate pitting generalized edema, moderate pitting edema to BUE, deep pitting edema to BLE, and mild pitting edema to face, perineum, and sacrum.  Recommend exchanging NG tube for Cortrak and bridle. Will discuss with MD.  EDW: 97.7 kg Admit weight: 93.3 kg (first measured weight on 7/25) Current weight: 91.3 kg  Medications reviewed and include: aranesp weekly, miralax, senna, IV abx IVF: D5-NS @ 10 ml/hr  Labs reviewed: phosphorus 5.7, WBC 42.7, hemoglobin 7.3, platelets 146 CBG's: 83-112 x 24 hours  UOP: 300 ml x 12 hours I/O's: +547 ml since admit  NUTRITION - FOCUSED PHYSICAL EXAM:  Flowsheet Row Most Recent Value  Orbital Region Severe depletion  Upper Arm Region Mild depletion  Thoracic and Lumbar Region Mild depletion  Buccal Region Moderate depletion  Temple Region Moderate depletion  Clavicle Bone Region Severe depletion  Clavicle and Acromion Bone Region Severe depletion  Scapular Bone Region Moderate depletion  Dorsal  Hand Unable to assess  Patellar Region Moderate depletion  [edema may be masking more significant depletions]  Anterior Thigh Region Moderate depletion  [edema may be masking more significant depletions]  Posterior Calf Region Mild depletion  [edema may be masking more significant depletions]  Edema (RD Assessment) Moderate  Hair Reviewed  Eyes Reviewed  Mouth Reviewed  Skin  Reviewed  Nails Reviewed    Diet Order:   Diet Order             Diet NPO time specified  Diet effective now                   EDUCATION NEEDS:   Not appropriate for education at this time  Skin:  Skin Assessment: Reviewed RN Assessment  Last BM:  no documented BM  Height:   Ht Readings from Last 1 Encounters:  11/10/22 5\' 9"  (1.753 m)    Weight:   Wt Readings from Last 1 Encounters:  11/15/22 91.3 kg    Ideal Body Weight:  72.7 kg  BMI:  Body mass index is 29.72 kg/m.  Estimated Nutritional Needs:   Kcal:  2000-2200  Protein:  100-115 grams  Fluid:  1000 ml + UOP    Mertie Clause, MS, RD, LDN Inpatient Clinical Dietitian Please see AMiON for contact information.

## 2022-11-16 ENCOUNTER — Inpatient Hospital Stay (HOSPITAL_COMMUNITY): Payer: Medicare Other

## 2022-11-16 DIAGNOSIS — J9601 Acute respiratory failure with hypoxia: Secondary | ICD-10-CM | POA: Diagnosis not present

## 2022-11-16 DIAGNOSIS — M7989 Other specified soft tissue disorders: Secondary | ICD-10-CM

## 2022-11-16 LAB — CBC WITH DIFFERENTIAL/PLATELET
Abs Immature Granulocytes: 3.33 10*3/uL — ABNORMAL HIGH (ref 0.00–0.07)
Basophils Absolute: 0.2 10*3/uL — ABNORMAL HIGH (ref 0.0–0.1)
Basophils Relative: 1 %
Eosinophils Absolute: 0 10*3/uL (ref 0.0–0.5)
Eosinophils Relative: 0 %
HCT: 22.2 % — ABNORMAL LOW (ref 39.0–52.0)
Hemoglobin: 7.1 g/dL — ABNORMAL LOW (ref 13.0–17.0)
Immature Granulocytes: 8 %
Lymphocytes Relative: 5 %
Lymphs Abs: 2.1 10*3/uL (ref 0.7–4.0)
MCH: 31.3 pg (ref 26.0–34.0)
MCHC: 32 g/dL (ref 30.0–36.0)
MCV: 97.8 fL (ref 80.0–100.0)
Monocytes Absolute: 4.1 10*3/uL — ABNORMAL HIGH (ref 0.1–1.0)
Monocytes Relative: 10 %
Neutro Abs: 29.7 10*3/uL — ABNORMAL HIGH (ref 1.7–7.7)
Neutrophils Relative %: 76 %
Platelets: 146 10*3/uL — ABNORMAL LOW (ref 150–400)
RBC: 2.27 MIL/uL — ABNORMAL LOW (ref 4.22–5.81)
RDW: 17.6 % — ABNORMAL HIGH (ref 11.5–15.5)
Smear Review: NORMAL
WBC: 39.4 10*3/uL — ABNORMAL HIGH (ref 4.0–10.5)
nRBC: 0.2 % (ref 0.0–0.2)

## 2022-11-16 LAB — GLUCOSE, CAPILLARY
Glucose-Capillary: 119 mg/dL — ABNORMAL HIGH (ref 70–99)
Glucose-Capillary: 130 mg/dL — ABNORMAL HIGH (ref 70–99)
Glucose-Capillary: 135 mg/dL — ABNORMAL HIGH (ref 70–99)
Glucose-Capillary: 132 mg/dL — ABNORMAL HIGH (ref 70–99)
Glucose-Capillary: 133 mg/dL — ABNORMAL HIGH (ref 70–99)

## 2022-11-16 MED ORDER — SENNOSIDES-DOCUSATE SODIUM 8.6-50 MG PO TABS
1.0000 | ORAL_TABLET | Freq: Two times a day (BID) | ORAL | Status: DC
  Administered 2022-11-16 (×2): 1
  Filled 2022-11-16: qty 1

## 2022-11-16 MED ORDER — HEPARIN SODIUM (PORCINE) 1000 UNIT/ML IJ SOLN
INTRAMUSCULAR | Status: AC
  Filled 2022-11-16: qty 10

## 2022-11-16 MED ORDER — LORAZEPAM 2 MG/ML IJ SOLN
1.0000 mg | Freq: Once | INTRAMUSCULAR | Status: AC
  Administered 2022-11-16: 1 mg via INTRAVENOUS
  Filled 2022-11-16: qty 1

## 2022-11-16 MED ORDER — CLOZAPINE 100 MG PO TABS: 100.0000 mg | ORAL_TABLET | Freq: Every day | ORAL | Status: AC

## 2022-11-16 MED ORDER — LIDOCAINE HCL 1 % IJ SOLN
INTRAMUSCULAR | Status: AC
  Filled 2022-11-16: qty 20

## 2022-11-16 NOTE — Progress Notes (Signed)
[facility] advised that pt to d/c to [facility] today.   Olivia Canter Renal Navigator [phone]

## 2022-11-16 NOTE — Progress Notes (Signed)
NAME:  Barri Norell, MRN:  [id number], DOB:  08/31/1955, LOS: 6 ADMISSION DATE:  11/10/2022, CONSULTATION DATE:  7/24 REFERRING MD:  Dr. Dalene Seltzer, CHIEF COMPLAINT:  hypoxic respiratory failure; hypotension   History of Present Illness:  Patient is a 67 yo M w/ pertinent Pmh COPD, ESRD recently started on HD, schizoaffective w/ bipolar disorder, depression/anxiety, hypothyroidism, tobacco abuse, OSA on cpap present to [facility] ED 7/24 w/ hypoxic respiratory failure.  Of note patient recently admitted to [facility] 6/30 for lethargy. Found to have acute urinary retention and severe AKI on CKD 3a requiring dialysis. During hospital course, patient had hypovolemic shock and anemia from left neck hematoma. BP improved w/ PRBCs and fluids. Started on midodrine. Patient had RLL PNA and completed course of abx. Had rhabdo which improved w/ fluid resuscitation. Discharged on 7/18.   On 7/24 patient after dialysis having sob and hypoxia. Sats in 30s. Increased [place] to 4L. EMS called and placed on NRB and transferred to [facility] ED. On ED arrival sats 97% on NRB. BP 106/60. Patient placed on bipap. RR 30s. Afebrile. BNP 1,071, LA 4.8,  and trop 173. CXR w/ opacities b/l concerning for mulilobar pneumonia. Cultures obtained and started on cefepime/vanc. Covid/flu pending. PCCM consulted.   Pertinent ED Labs: wbc, 4.1, hgb 8, AG 18,   Pertinent  Medical History  COPD ESRD recently started on HD schizoaffective w/ bipolar disorder depression/anxiety Hypothyroidism tobacco abuse OSA on cpap Significant Hospital Events: Including procedures, antibiotic start and stop dates in addition to other pertinent events   7/24 admitted to [facility] w/ resp failure w/ hypoxia on bipap, started on unasyn and tobramycin for acinetobacter baumannii PNA and bacteremia.  7/26 weaned off of precedex overnight.  Unable to wean off BiPAP 7/27 Required BiPAP overnight,Started on Cardizem overnight for A-fib RVR 7/30 off cardizem, On 6L  [facility] Interim History / Subjective:  Altered mental status more from yesterday. Continues to require 6L.   Objective   Blood pressure 114/66, pulse 79, temperature 98.2 F (36.8 C), temperature source Axillary, resp. rate 13, height 5\' 9"  (1.753 m), weight 91.2 kg, SpO2 100%.        Intake/Output Summary (Last 24 hours) at 11/16/2022 0920 Last data filed at 11/16/2022 0600 Gross per 24 hour  Intake 853.92 ml  Output 3025 ml  Net -2171.08 ml   Filed Weights   11/14/22 2053 11/15/22 0131 11/16/22 0700  Weight: 91.3 kg 91.3 kg 91.2 kg    Examination: Gen:      No acute distress, chronically ill-appearing HEENT:  EOMI, sclera anicteric Neck:     No masses; no thyromegaly Lungs:    Dispersed rhonci, No tachypnea  CV:         Regular rate and rhythm; no murmurs Abd:      + bowel sounds; soft, non-tender; no palpable masses, no distension Ext:    2+ edema; adequate peripheral perfusion Skin:      Warm and dry; no rash Neuro: Awake, but not very alert, does not answer questions   Labs/imaging reviewed CBC    Component Value Date/Time   WBC 39.4 (H) 11/16/2022 0847   RBC 2.27 (L) 11/16/2022 0847   HGB 7.1 (L) 11/16/2022 0847   HGB 13.0 02/21/2012 0723   HCT 22.2 (L) 11/16/2022 0847   HCT 38.5 (L) 02/21/2012 0723   PLT 146 (L) 11/16/2022 0847   PLT 178 02/21/2012 0723   MCV 97.8 11/16/2022 0847   MCV 96 02/21/2012 0723   MCH 31.3  11/16/2022 0847   MCHC 32.0 11/16/2022 0847   RDW 17.6 (H) 11/16/2022 0847   RDW 14.2 02/21/2012 0723   LYMPHSABS PENDING 11/16/2022 0847   LYMPHSABS 3.0 02/21/2012 0723   MONOABS PENDING 11/16/2022 0847   MONOABS 1.2 (H) 02/21/2012 0723   EOSABS PENDING 11/16/2022 0847   EOSABS 0.2 02/21/2012 0723   BASOSABS PENDING 11/16/2022 0847   BASOSABS 0.1 02/21/2012 0723    Resolved Hospital Problem list   Septic shock  Assessment & Plan:  Acute respiratory failure w/ hypoxia Parainfluenza with superimposed Acinetobacter baumannii multifocal  pneumonia +/- Pulmonary edema COPD OSA on cpap Improving respiratory status, off BiPAP. WBC improving. Patient most likely aspirated a few days ago as WBC had doubled to 40K, but is now improving.  -note DNR/DNI  Plan: Wean down oxygen as tolerated, continue intermittent chest x-ray Antibiotics treatment with Unasyn   New onset atrial fibrillation/RVR (resolved)  Reverted to sinus rhythm on Cardizem drip -Monitor , holding off anticoagulation  New diagnosis of ESRD on HD  Patient with AKI secondary to rhabdo at previous admission, worsened to ESRD.  Tolerated HD 7/29 with fluid removal  Plan: - HD per renal for volume removal - Continue midodrine -Trend BMP / urinary output - Replace electrolytes as indicated - Avoid nephrotoxic agents, ensure adequate renal perfusion  Anemia  Patient with chronic anemia with exacerbation at last admission in June secondary to large hematoma requiring transfusions. Was receiving IV Fe with HD.  Plan:  - Hold IV Fe given bacteremia  - Nephro to give Aranesp weekly  - Monitor CBC   Schizoaffective disorder, bipolar type Depression with anxiety Plan: Psychiatry consulted  - Continue clozaril  (decreased to 100 BID yesterday, buspar, ingrezza   Hypothyroidism Plan: -Continue synthroid   Tobacco abuse Plan: -nicotine patch  BPH Plan: -doxazosin   Need further discussion/clarification goals of care. Note DNR/DNI.  Palliative care has been consulted Patient to transfer to [facility] (right click and "Reselect all SmartList Selections" daily)   Diet/type: NPO DVT prophylaxis: prophylactic heparin  GI prophylaxis: N/A Lines: Dialysis Catheter Foley:  N/A Code Status:  DNR Last date of multidisciplinary goals of care discussion   Lockie Mola MD  PGY-2 [facility]

## 2022-11-16 NOTE — Progress Notes (Signed)
[facility] Progress Note   Subjective: seen in ICU.  On 6L [place]. Had HD overnight UF 2.5L.  I/Os -2.1L yesterday.  LE dopplers r/o DVT pending.  More sedated today.  Still [facility]         Vitals:    11/13/22 0645 11/13/22 0700 11/13/22 0714 11/13/22 0800  BP: 111/70 112/68   (!) 150/57  Pulse: 75 73   71  Resp: (!) 28 (!) 23   19  Temp:     (!) 97.2 F (36.2 C)    TempSrc:     Axillary    SpO2: 95% 93%   100%  Weight:          Height:              Exam: Gen drowsy, arousable some with shoulder shake Neck no jvd Chest coarse rhonchi R > L RRR no RG Abd soft ntnd no mass or ascites +bs Ext diffuse 2-3+ pitting bilat LE edema up to hips Neuro arousable but very sleepy - quickly back to sleep, will follow some commands but not speaking    RIJ TDC c/d/i       Home meds- tylenol, ventolin, maalox, pepto bismol, buspar bid, clozapine bid, depakote er, cardura 4mg  every day, robitussin, synthroid, imodium, MOM, antivert, renavite, nicotine patch, nepro supps, protonix, senokot-s, desyrel, ingrezza, b-12, vit D      OP HD: [facility]  3.5h   400/300  97.7kg   2/2.25 bath  RIJ TDC  Heparin 2000 - last and only OP HD was on 7/22, post wt 98.4kg - venofer 100mg  IV q HD thru 8/16 - no esa or vdra     Assessment/ Plan: Acute hypoxic resp failure - due to infection/ multifocal pna. Some improvement, off Bipap and on 6 L [place] today. R sounds much more rhonchorous today but stable sats and afebrile. Hypotension - shock, due to infection/ pna. Off pressors x >48 hrs now. On midodrine.  Parainfluenza infection / bact PNA - RVP + for parainfluenza and blood cx's + for acinetobacter baumannii. On IV abx now (unasyn to cont 10d past HD line change).  Will need TDC exchange - afebrile for now and repeat cultures NGTD so can hold off for today. Plan is to get this done at [facility]. Severe AKI / dialysis dependent - due to acute rhabdo, started HD 10/23/22 at [facility]. Pt is  not esrd yet. Last OP HD was 7/22. Did not tolerate HD here on 7/24. Had HD here Friday, Sat, MOn. Next HD Wed.  Volume - marked LE edema, off pressor support now. Had HD Friday w/ min UF, extra HD Sat w/ 2.5 L off. HD Mon 2.5L, cont to lower vol as tolerated. BP's improving.  LE doppler today r/o DVT. Atrial fib - improved, now in NSR Anemia esrd - Hb 8- 9 here, getting IV Fe in OP setting but will hold off due to infection. Was not on esa at OP unit -> started esa here w/ darbe 60 mcg sq weekly.  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 No vdra or binder listed. Follow.  Schizoaffective d/o - is on multiple psychiatric meds  --> maybe oversedated now Hypothyroidism  DNI / DNR  Estill Bakes MD [facility] Pager [phone]    Last Labs        Recent Labs  Lab 11/10/22 1336 11/10/22 1348 11/11/22 0102 11/12/22 0902  HGB 8.0*   < >  8.0* 7.8*  ALBUMIN 2.0*  --   --  2.0*  CALCIUM 8.4*  --  8.2* 8.1*  PHOS  --    < > 5.0* 6.0*  CREATININE 3.81*   < > 3.58* 3.96*  K 3.9   < > 3.7 4.8   < > = values in this interval not displayed.      Last Labs  No results for input(s): "IRON", "TIBC", "FERRITIN" in the last 168 hours.   Inpatient medications:  Chlorhexidine Gluconate Cloth  6 each Topical Daily   darbepoetin (ARANESP) injection - DIALYSIS  60 mcg Subcutaneous Q Wed-1800   heparin  5,000 Units Subcutaneous Q8H   midodrine  10 mg Oral TID WC   nicotine  14 mg Transdermal Daily   mouth rinse  15 mL Mouth Rinse 4 times per day         ampicillin-sulbactam (UNASYN) IV Stopped (11/12/22 2324)   dexmedetomidine (PRECEDEX) IV infusion 0.2 mcg/kg/hr (11/13/22 0700)   dextrose 5 % and 0.9 % NaCl Stopped (11/12/22 0824)   diltiazem (CARDIZEM) infusion Stopped (11/13/22 5284)   norepinephrine (LEVOPHED) Adult infusion 2 mcg/min (11/13/22 0700)        docusate, heparin, ipratropium-albuterol, mouth rinse, polyethylene glycol

## 2022-11-16 NOTE — Progress Notes (Signed)
Received patient at bedside in ICU.  Alert.  Informed consent signed and in chart.   TX duration: 3.5 Hours  Patient tolerated well.  Alert, without acute distress.  Hand-off given to patient's nurse.   Access used: RIJ Access issues: None  Total UF removed: 2500 mL Medication(s) given: Heparin 2000 Units Post HD VS: 97.6 - 120/56 (71) - 81 - 94% Post HD weight: Unable to obtain     11/16/22 0045  Vitals  Temp 97.6 F (36.4 C)  Temp Source Axillary  BP (!) 120/56  MAP (mmHg) 71  BP Location Right Leg  BP Method Automatic  Patient Position (if appropriate) Lying  Pulse Rate 81  Pulse Rate Source Monitor  ECG Heart Rate 88  Resp 18  Oxygen Therapy  SpO2 94 %  Patient Activity (if Appropriate) In bed  Pulse Oximetry Type Continuous  Post Treatment  Dialyzer Clearance Lightly streaked  Duration of HD Treatment -hour(s) 3.5 hour(s)  Hemodialysis Intake (mL) 0 mL  Liters Processed 73.5  Fluid Removed (mL) 2500 mL  Tolerated HD Treatment Yes  Note  Patient Observations Treatment completed without issue, patient tolerated well, VSS, no acute distress noted. Patient condition stable for this d/c.  Hemodialysis Catheter Right Internal jugular Double lumen Permanent (Tunneled)  Placement Date/Time: 10/27/22 1539   Time Out: Correct patient;Correct site;Correct procedure  Maximum sterile barrier precautions: Hand hygiene;Cap;Mask;Sterile gown;Sterile gloves;Large sterile sheet  Site Prep: Chlorhexidine (preferred);Skin Prep C...  Site Condition No complications  Blue Lumen Status Flushed;Heparin locked;Dead end cap in place  Red Lumen Status Flushed;Heparin locked;Dead end cap in place  Purple Lumen Status N/A  Catheter fill solution Heparin 1000 units/ml  Catheter fill volume (Arterial) 1.6 cc  Catheter fill volume (Venous) 1.7  Dressing Type Transparent  Dressing Status Antimicrobial disc in place;Clean, Dry, Intact  Drainage Description None  Post treatment catheter  status Capped and Clamped    [facility]

## 2022-11-16 NOTE — Progress Notes (Signed)
Left lower extremity venous duplex completed. Please see CV Procedures for preliminary results.  Shona Simpson, RVT 11/16/22 11:16 AM

## 2022-11-16 NOTE — Progress Notes (Signed)
Attempted to give report to [facility]. Awaiting call back from receiving RN.

## 2022-11-16 NOTE — Progress Notes (Signed)
SLP Cancellation Note  Patient Details Name: Austin Marsh MRN: [id number] DOB: 07/11/1955   Cancelled treatment:        Attempted to see for swallow therapy however RN stated pt is not arousable this morning and she has messaged the doctors. Will defer today and plan to follow up tomorrow.    Royce Macadamia 11/16/2022, 9:55 AM

## 2022-11-16 NOTE — Care Management (Addendum)
71 Spoke w attending, awaiting labs and U/S of leg today, DC to [facility] pending. Updated Irving Burton, [facility] liaison.  1400 Notified by MD that patient ok to DC to [facility] today. Updated Irving Burton. Spoke w nurse who will complete EMTALA. DNR and facesheet and Med necessity form on chart.  Irving Burton states that she does not need printed clinicals, she can access through EPIC.  Will call for PTAR once bedside nurse is ready.  Spoke w patient's daughter and updated her of discharge to [facility] today  1445 Called in to [place], report patient on 6l, will travel with PIV/ NSL, and cortrak that will be flushed and locked for transport. PTAR states it will be a few hours, nurse updated by secure chat.

## 2022-11-16 NOTE — Progress Notes (Signed)
Received call back from patient's brother-in-law, Laban Emperor. I updated him on patient plan to move to [facility]  and on patient's current condition.

## 2022-11-16 NOTE — Progress Notes (Signed)
Dr. Isaiah Serge notified of patient's abdominal breathing and inc WOB since yesterday. Patient grimacing. He came to assess patient. No new orders. Oxycodone given per previous order. Will monitor closely for comfort. Attempts to reach step daughter and brother-in-law to notify them of Austin Marsh' pending move to [facility] were unsuccessful. LM at both numbers.

## 2022-11-17 DIAGNOSIS — N179 Acute kidney failure, unspecified: Secondary | ICD-10-CM

## 2022-11-17 DIAGNOSIS — J9601 Acute respiratory failure with hypoxia: Secondary | ICD-10-CM

## 2022-11-17 DIAGNOSIS — J1561 Pneumonia due to Acinetobacter baumannii: Secondary | ICD-10-CM

## 2022-11-17 DIAGNOSIS — I480 Paroxysmal atrial fibrillation: Secondary | ICD-10-CM

## 2022-11-17 DIAGNOSIS — F250 Schizoaffective disorder, bipolar type: Secondary | ICD-10-CM

## 2022-11-17 NOTE — Care Management Important Message (Signed)
Important Message  Patient Details  Name: Austin Marsh MRN: [id number] Date of Birth: March 27, 1956   Medicare Important Message Given:  Yes 07/30 Patient left prior to IM delivery will mail a copy to the patient home address.      Sharnee Douglass 11/17/2022, 4:07 PM

## 2022-11-17 NOTE — Care Management Important Message (Signed)
Important Message  Patient Details  Name: Austin Marsh MRN: [id number] Date of Birth: [birth year]   Medicare Important Message Given:  Yes     Orry Sigl Stefan Church 11/17/2022, 4:08 PM

## 2022-11-18 LAB — CULTURE, BLOOD (ROUTINE X 2)
Culture: NO GROWTH
Culture: NO GROWTH
Special Requests: ADEQUATE
Special Requests: ADEQUATE

## 2022-11-20 DIAGNOSIS — J1561 Pneumonia due to Acinetobacter baumannii: Secondary | ICD-10-CM

## 2022-11-20 DIAGNOSIS — I480 Paroxysmal atrial fibrillation: Secondary | ICD-10-CM

## 2022-11-20 DIAGNOSIS — F250 Schizoaffective disorder, bipolar type: Secondary | ICD-10-CM

## 2022-11-20 DIAGNOSIS — J9601 Acute respiratory failure with hypoxia: Secondary | ICD-10-CM

## 2022-11-20 DIAGNOSIS — N179 Acute kidney failure, unspecified: Secondary | ICD-10-CM

## 2022-11-22 DIAGNOSIS — F250 Schizoaffective disorder, bipolar type: Secondary | ICD-10-CM

## 2022-11-22 DIAGNOSIS — J9601 Acute respiratory failure with hypoxia: Secondary | ICD-10-CM

## 2022-11-22 DIAGNOSIS — J1561 Pneumonia due to Acinetobacter baumannii: Secondary | ICD-10-CM

## 2022-11-22 DIAGNOSIS — I480 Paroxysmal atrial fibrillation: Secondary | ICD-10-CM

## 2022-11-22 DIAGNOSIS — N179 Acute kidney failure, unspecified: Secondary | ICD-10-CM

## 2022-11-25 DIAGNOSIS — J9690 Respiratory failure, unspecified, unspecified whether with hypoxia or hypercapnia: Secondary | ICD-10-CM

## 2022-12-19 DEATH — deceased
